# Patient Record
Sex: Male | Born: 1945
Health system: Southern US, Community
[De-identification: ages and names within clinical notes are randomized; demographics above are authoritative.]

## PROBLEM LIST (undated history)

## (undated) DIAGNOSIS — I10 Essential (primary) hypertension: Secondary | ICD-10-CM

## (undated) DIAGNOSIS — R7303 Prediabetes: Secondary | ICD-10-CM

## (undated) DIAGNOSIS — E785 Hyperlipidemia, unspecified: Secondary | ICD-10-CM

## (undated) DIAGNOSIS — K5900 Constipation, unspecified: Secondary | ICD-10-CM

## (undated) DIAGNOSIS — E559 Vitamin D deficiency, unspecified: Secondary | ICD-10-CM

## (undated) HISTORY — DX: Constipation, unspecified: K59.00

## (undated) HISTORY — DX: Vitamin D deficiency, unspecified: E55.9

## (undated) HISTORY — DX: Prediabetes: R73.03

## (undated) HISTORY — PX: COLONOSCOPY: SHX174

## (undated) HISTORY — DX: Essential (primary) hypertension: I10

## (undated) HISTORY — DX: Hyperlipidemia, unspecified: E78.5

## (undated) HISTORY — PX: NO PAST SURGERIES: SHX2092

---

## 2012-01-04 ENCOUNTER — Encounter: Payer: Self-pay | Admitting: Gastroenterology

## 2013-01-22 ENCOUNTER — Ambulatory Visit (HOSPITAL_COMMUNITY)
Admission: RE | Admit: 2013-01-22 | Discharge: 2013-01-22 | Disposition: A | Discharge status: Home / Self Care | Payer: Medicare Other | Source: Ambulatory Visit | Attending: Internal Medicine | Admitting: Internal Medicine

## 2013-01-22 ENCOUNTER — Other Ambulatory Visit (HOSPITAL_COMMUNITY): Payer: Self-pay | Admitting: Internal Medicine

## 2013-01-22 DIAGNOSIS — M549 Dorsalgia, unspecified: Secondary | ICD-10-CM

## 2013-01-22 DIAGNOSIS — N200 Calculus of kidney: Secondary | ICD-10-CM | POA: Insufficient documentation

## 2013-01-22 DIAGNOSIS — R109 Unspecified abdominal pain: Secondary | ICD-10-CM | POA: Insufficient documentation

## 2013-01-23 ENCOUNTER — Encounter: Payer: Self-pay | Admitting: Gastroenterology

## 2013-08-31 ENCOUNTER — Other Ambulatory Visit: Payer: Self-pay | Admitting: Internal Medicine

## 2013-08-31 MED ORDER — PRAVASTATIN SODIUM 40 MG PO TABS: 40.0000 mg | ORAL_TABLET | Freq: Every evening | ORAL | Status: DC

## 2013-09-17 ENCOUNTER — Other Ambulatory Visit: Payer: Self-pay | Admitting: Internal Medicine

## 2013-09-17 MED ORDER — OMEPRAZOLE 20 MG PO CPDR: 20.0000 mg | DELAYED_RELEASE_CAPSULE | Freq: Two times a day (BID) | ORAL | Status: DC

## 2013-09-18 ENCOUNTER — Ambulatory Visit: Payer: Self-pay | Admitting: Internal Medicine

## 2013-10-08 ENCOUNTER — Encounter: Payer: Self-pay | Admitting: Emergency Medicine

## 2013-10-08 ENCOUNTER — Ambulatory Visit (INDEPENDENT_AMBULATORY_CARE_PROVIDER_SITE_OTHER): Payer: Medicare Other | Admitting: Emergency Medicine

## 2013-10-08 VITALS — BP 100/62 | HR 62 | Temp 98.4°F | Resp 16 | Ht 74.25 in | Wt 198.0 lb

## 2013-10-08 DIAGNOSIS — J309 Allergic rhinitis, unspecified: Secondary | ICD-10-CM

## 2013-10-08 DIAGNOSIS — I10 Essential (primary) hypertension: Secondary | ICD-10-CM

## 2013-10-08 DIAGNOSIS — J329 Chronic sinusitis, unspecified: Secondary | ICD-10-CM

## 2013-10-08 DIAGNOSIS — J209 Acute bronchitis, unspecified: Secondary | ICD-10-CM

## 2013-10-08 MED ORDER — PREDNISONE 10 MG PO TABS: ORAL_TABLET | ORAL | Status: DC

## 2013-10-08 MED ORDER — BENZONATATE 100 MG PO CAPS: 100.0000 mg | ORAL_CAPSULE | Freq: Three times a day (TID) | ORAL | Status: DC | PRN

## 2013-10-08 MED ORDER — AZITHROMYCIN 250 MG PO TABS: ORAL_TABLET | ORAL | Status: AC

## 2013-10-08 NOTE — Progress Notes (Signed)
   Subjective:    Patient ID: Mathew Miller, male    DOB: 27-Apr-1946, 67 y.o.   MRN: [id number]  HPI Comments: 67 yo male with allergy drainage over last week or so increased with cleaning dusty office. Saturday noticed increased sinus pressure. He notes green production with cough. Tessalon perles have helped thin things a little.  He has retired and has noticed decreased stress level and is feeling well overall.  Cough Associated symptoms include postnasal drip.   Current Outpatient Prescriptions on File Prior to Visit  Medication Sig Dispense Refill  . omeprazole (PRILOSEC) 20 MG capsule Take 1 capsule (20 mg total) by mouth 2 (two) times daily.  180 capsule  1  . pravastatin (PRAVACHOL) 40 MG tablet Take 1 tablet (40 mg total) by mouth every evening.  90 tablet  1   No current facility-administered medications on file prior to visit.   ALLERGIES Sudafed and Tetanus toxoids  No past medical history on file.    Review of Systems  HENT: Positive for congestion, postnasal drip and sinus pressure.   Respiratory: Positive for cough.    BP 100/62  Pulse 62  Temp(Src) 98.4 F (36.9 C) (Temporal)  Resp 16  Ht 6' 2.25" (1.886 m)  Wt 198 lb (89.812 kg)  BMI 25.25 kg/m2     Objective:   Physical Exam  Nursing note and vitals reviewed. Constitutional: He is oriented to person, place, and time. He appears well-developed and well-nourished.  HENT:  Head: Normocephalic and atraumatic.  Right Ear: External ear normal.  Left Ear: External ear normal.  Nose: Nose normal.  Mouth/Throat: Oropharynx is clear and moist. No oropharyngeal exudate.  Frontal tenderness, TMs cloudy  Eyes: Conjunctivae are normal.  Neck: Normal range of motion.  Cardiovascular: Normal rate, regular rhythm, normal heart sounds and intact distal pulses.   Pulmonary/Chest: Effort normal and breath sounds normal.  Abdominal: Soft.  Musculoskeletal: Normal range of motion.  Lymphadenopathy:    He has no  cervical adenopathy.  Neurological: He is alert and oriented to person, place, and time.  Skin: Skin is warm and dry.  Psychiatric: He has a normal mood and affect. Judgment normal.          Assessment & Plan:  1. HTN HX- With decreased stress check BP if stays low decrease Enalapril by 1/2 =10mg  2. Bronchitis/ Sinus Infection, Allergic rhinitis-Zpak, Tessalon Perles, Pred 10 mg DP all AD. Allegra OTC, increase H2o, allergy hygiene explained.

## 2013-10-08 NOTE — Patient Instructions (Signed)
Sinusitis Sinusitis is redness, soreness, and puffiness (inflammation) of the air pockets in the bones of your face (sinuses). The redness, soreness, and puffiness can cause air and mucus to get trapped in your sinuses. This can allow germs to grow and cause an infection.  HOME CARE   Drink enough fluids to keep your pee (urine) clear or pale yellow.  Use a humidifier in your home.  Run a hot shower to create steam in the bathroom. Sit in the bathroom with the door closed. Breathe in the steam 3 4 times a day.  Put a warm, moist washcloth on your face 3 4 times a day, or as told by your doctor.  Use salt water sprays (saline sprays) to wet the thick fluid in your nose. This can help the sinuses drain.  Only take medicine as told by your doctor. GET HELP RIGHT AWAY IF:   Your pain gets worse.  You have very bad headaches.  You are sick to your stomach (nauseous).  You throw up (vomit).  You are very sleepy (drowsy) all the time.  Your face is puffy (swollen).  Your vision changes.  You have a stiff neck.  You have trouble breathing. MAKE SURE YOU:   Understand these instructions.  Will watch your condition.  Will get help right away if you are not doing well or get worse. Document Released: 03/22/2008 Document Revised: 06/28/2012 Document Reviewed: 05/09/2012 Exit[employer] Patient Information 2014 [employer]. Bronchitis Bronchitis is a problem of the air tubes leading to your lungs. This problem makes it hard for air to get in and out of the lungs. You may cough a lot because your air tubes are narrow. Going without care can cause lasting (chronic) bronchitis. HOME CARE   Drink enough fluids to keep your pee (urine) clear or pale yellow.  Use a cool mist humidifier.  Quit smoking if you smoke. If you keep smoking, the bronchitis might not get better.  Only take medicine as told by your doctor. GET HELP RIGHT AWAY IF:   Coughing keeps you awake.  You start to  wheeze.  You become more sick or weak.  You have a hard time breathing or get short of breath.  You cough up blood.  Coughing lasts more than 2 weeks.  You have a fever.  Your baby is older than 3 months with a rectal temperature of 102 F (38.9 C) or higher.  Your baby is 3 months old or younger with a rectal temperature of 100.4 F (38 C) or higher. MAKE SURE YOU:  Understand these instructions.  Will watch your condition.  Will get help right away if you are not doing well or get worse. Document Released: 03/22/2008 Document Revised: 12/27/2011 Document Reviewed: 05/29/2013 Exit[employer] Patient Information 2014 [employer].  

## 2013-10-09 DIAGNOSIS — I10 Essential (primary) hypertension: Secondary | ICD-10-CM | POA: Insufficient documentation

## 2013-10-15 ENCOUNTER — Other Ambulatory Visit: Payer: Self-pay | Admitting: Emergency Medicine

## 2013-10-15 MED ORDER — AZITHROMYCIN 250 MG PO TABS: ORAL_TABLET | ORAL | Status: AC

## 2013-10-30 ENCOUNTER — Telehealth: Payer: Self-pay

## 2013-10-30 NOTE — Telephone Encounter (Signed)
Referral to see Dr.Tannenbaum has been approved. Pt aware.

## 2013-11-24 DIAGNOSIS — E785 Hyperlipidemia, unspecified: Secondary | ICD-10-CM | POA: Insufficient documentation

## 2013-11-24 DIAGNOSIS — Z87898 Personal history of other specified conditions: Secondary | ICD-10-CM | POA: Insufficient documentation

## 2013-11-27 ENCOUNTER — Encounter: Payer: Self-pay | Admitting: Internal Medicine

## 2013-12-25 ENCOUNTER — Encounter: Payer: Self-pay | Admitting: Internal Medicine

## 2014-02-04 ENCOUNTER — Other Ambulatory Visit: Payer: Self-pay | Admitting: Internal Medicine

## 2014-02-05 ENCOUNTER — Telehealth: Payer: Self-pay | Admitting: *Deleted

## 2014-02-05 MED ORDER — ENALAPRIL MALEATE 20 MG PO TABS: ORAL_TABLET | ORAL | Status: DC

## 2014-02-05 NOTE — Telephone Encounter (Signed)
REFILL= ENALAPRIL 20MG  #90

## 2014-03-01 ENCOUNTER — Encounter: Payer: Self-pay | Admitting: Internal Medicine

## 2014-03-01 ENCOUNTER — Ambulatory Visit (INDEPENDENT_AMBULATORY_CARE_PROVIDER_SITE_OTHER): Payer: Commercial Managed Care - HMO | Admitting: Internal Medicine

## 2014-03-01 VITALS — BP 138/90 | HR 52 | Temp 98.2°F | Resp 16 | Ht 74.0 in | Wt 200.0 lb

## 2014-03-01 DIAGNOSIS — R7303 Prediabetes: Secondary | ICD-10-CM

## 2014-03-01 DIAGNOSIS — Z125 Encounter for screening for malignant neoplasm of prostate: Secondary | ICD-10-CM

## 2014-03-01 DIAGNOSIS — I10 Essential (primary) hypertension: Secondary | ICD-10-CM

## 2014-03-01 DIAGNOSIS — Z1212 Encounter for screening for malignant neoplasm of rectum: Secondary | ICD-10-CM

## 2014-03-01 DIAGNOSIS — Z79899 Other long term (current) drug therapy: Secondary | ICD-10-CM | POA: Insufficient documentation

## 2014-03-01 DIAGNOSIS — Z1331 Encounter for screening for depression: Secondary | ICD-10-CM

## 2014-03-01 DIAGNOSIS — Z789 Other specified health status: Secondary | ICD-10-CM

## 2014-03-01 DIAGNOSIS — E785 Hyperlipidemia, unspecified: Secondary | ICD-10-CM

## 2014-03-01 DIAGNOSIS — Z7189 Other specified counseling: Secondary | ICD-10-CM | POA: Insufficient documentation

## 2014-03-01 DIAGNOSIS — Z0000 Encounter for general adult medical examination without abnormal findings: Secondary | ICD-10-CM

## 2014-03-01 DIAGNOSIS — E559 Vitamin D deficiency, unspecified: Secondary | ICD-10-CM | POA: Insufficient documentation

## 2014-03-01 LAB — CBC WITH DIFFERENTIAL/PLATELET
Basophils Absolute: 0 10*3/uL (ref 0.0–0.1)
Basophils Relative: 0 % (ref 0–1)
Eosinophils Absolute: 0.2 10*3/uL (ref 0.0–0.7)
Eosinophils Relative: 2 % (ref 0–5)
HCT: 46.1 % (ref 39.0–52.0)
Hemoglobin: 16.1 g/dL (ref 13.0–17.0)
Lymphocytes Relative: 19 % (ref 12–46)
Lymphs Abs: 1.5 10*3/uL (ref 0.7–4.0)
MCH: 30.1 pg (ref 26.0–34.0)
MCHC: 34.9 g/dL (ref 30.0–36.0)
MCV: 86.3 fL (ref 78.0–100.0)
Monocytes Absolute: 0.6 10*3/uL (ref 0.1–1.0)
Monocytes Relative: 8 % (ref 3–12)
Neutro Abs: 5.8 10*3/uL (ref 1.7–7.7)
Neutrophils Relative %: 71 % (ref 43–77)
Platelets: 167 10*3/uL (ref 150–400)
RBC: 5.34 MIL/uL (ref 4.22–5.81)
RDW: 14.5 % (ref 11.5–15.5)
WBC: 8.1 10*3/uL (ref 4.0–10.5)

## 2014-03-01 LAB — HEMOGLOBIN A1C
Hgb A1c MFr Bld: 5.6 % (ref <5.7)
Mean Plasma Glucose: 114 mg/dL (ref <117)

## 2014-03-01 NOTE — Progress Notes (Signed)
Patient ID: Mathew Miller, male   DOB: Jan 06, 1946, 68 y.o.   MRN: [id number]   Annual Screening Comprehensive Examination  This very nice 68 y.o. [name] presents for complete physical.  Patient has been followed for HTN,  Prediabetes, Hyperlipidemia, and Vitamin D Deficiency.   HTN predates since 2000. Patient's BP has been controlled at home.Today's BP: 138/90 mmHg. Patient denies any cardiac symptoms as chest pain, palpitations, shortness of breath, dizziness or ankle swelling.   Patient's hyperlipidemia is controlled with diet and medications. Patient denies myalgias or other medication SE's. Last cholesterol last visit was 138, triglycerides 52, HDL 59 and LDL 69 in Aug 2014 - at goal and patient was recommended 3 month f/u  Which he never follows thru on as recommended.     Patient has prediabetes since   Aug 2010 with A1c 6.1% and last A1c was  5.3% with Insulin 11- normal in Aug 2014. Patient denies reactive hypoglycemic symptoms, visual blurring, diabetic polys or paresthesias.    Finally, patient has history of Vitamin D Deficiency of 33 in 2008 and last vitamin D 86 in Aug 2014.  Medication Sig  . atenolol (TENORMIN) 25 MG tablet Take 25 mg by mouth daily.  . Cholecalciferol (VITAMIN D-3) 5000 UNITS  Take 5,000 Units by mouth daily.  . enalapril (VASOTEC) 20 MG tablet TAKE ONE TABLET BY MOUTH ONCE DAILY  . Magnesium 250 MG TABS Take 250 mg by mouth daily.  Marland Kitchen omeprazole (PRILOSEC) 20 MG capsule Take 1 capsule (20 mg total) by mouth 2 (two) times daily.  . pravastatin (PRAVACHOL) 40 MG tablet Take 1 tablet (40 mg total) by mouth every evening.  . tadalafil (CIALIS) 20 MG tablet Take PRN   Allergies  Allergen Reactions  . Sudafed [Pseudoephedrine Hcl]     Urinary retention  . Tetanus Toxoids     Past Medical History  Diagnosis Date  . Hypertension   . Hyperlipidemia   . Vitamin D deficiency   . Prediabetes    Past Surgical History  Procedure Laterality Date  . No past  surgeries     Family History  Problem Relation Age of Onset  . Hyperlipidemia Mother   . Glaucoma Mother   . Heart disease Father   . Hypertension Father    History   Social History  . Marital Status: Unknown    Spouse Name: N/A    Number of Children: 2 daughters & 2 GC in good health  . Years of Education: N/A   Occupational History  . 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 Smoking status: Never Smoker   . Smokeless tobacco: Not on file  . Alcohol Use: Yes     Comment: Rare /Beer  . Drug Use: No  . Sexual Activity: Not on file    ROS Constitutional: Denies fever, chills, weight loss/gain, headaches, insomnia, fatigue, night sweats or change in appetite. Eyes: Denies redness, blurred vision, diplopia, discharge, itchy or watery eyes.  ENT: Denies discharge, congestion, post nasal drip, epistaxis, sore throat, earache, hearing loss, dental pain, Tinnitus, Vertigo, Sinus pain or snoring.  Cardio: Denies chest pain, palpitations, irregular heartbeat, syncope, dyspnea, diaphoresis, orthopnea, PND, claudication or edema Respiratory: denies cough, dyspnea, DOE, pleurisy, hoarseness, laryngitis or wheezing.  Gastrointestinal: Denies dysphagia, heartburn, reflux, water brash, pain, cramps, nausea, vomiting, bloating, diarrhea, constipation, hematemesis, melena, hematochezia, jaundice or hemorrhoids Genitourinary: Denies dysuria, frequency, urgency, nocturia, hesitancy, discharge, hematuria or flank pain Musculoskeletal: Denies  arthralgia, myalgia, stiffness, Jt. Swelling, pain, limp or strain/sprain. Denies falls. Skin: Denies puritis, rash, hives, warts, acne, eczema or change in skin lesion Neuro: No weakness, tremor, incoordination, spasms, paresthesia or pain Psychiatric: Denies confusion, memory loss or sensory loss . Denies Depression. Endocrine: Denies change in weight, skin, hair change, nocturia, and paresthesia,  diabetic polys, visual blurring or hyper / hypo glycemic episodes.  Heme/Lymph: No excessive bleeding, bruising or enlarged lymph nodes.  Physical Exam  BP 138/90  Pulse 52  Temp 98.2 F   Resp 16  Ht 6\' 2"    Wt 200 lb   BMI 25.67 kg/m2  General Appearance: Well nourished, in no apparent distress. Eyes: PERRLA, EOMs, conjunctiva no swelling or erythema, normal fundi and vessels. Sinuses: No frontal/maxillary tenderness ENT/Mouth: EACs patent / TMs  nl. Nares clear without erythema, swelling, mucoid exudates. Oral hygiene is good. No erythema, swelling, or exudate. Tongue normal, non-obstructing. Tonsils not swollen or erythematous. Hearing normal.  Neck: Supple, thyroid normal. No bruits, nodes or JVD. Respiratory: Respiratory effort normal.  BS equal and clear bilateral without rales, rhonci, wheezing or stridor. Cardio: Heart sounds are normal with regular rate and rhythm and no murmurs, rubs or gallops. Peripheral pulses are normal and equal bilaterally without edema. No aortic or femoral bruits. Chest: symmetric with normal excursions and percussion.  Abdomen: Flat, soft, with bowl sounds. Nontender, no guarding, rebound, hernias, masses, or organomegaly.  Lymphatics: Non tender without lymphadenopathy.  Genitourinary: No hernias.Testes nl. DRE - prostate nl for age - smooth & firm w/o nodules. Musculoskeletal: Full ROM all peripheral extremities, joint stability, 5/5 strength, and normal gait. Skin: Warm and dry without rashes, lesions, cyanosis, clubbing or  ecchymosis.  Neuro: Cranial nerves intact, reflexes equal bilaterally. Normal muscle tone, no cerebellar symptoms. Sensation intact.  Pysch: Awake and oriented X 3, normal affect, insight and judgment appropriate.   Assessment and Plan  1. Annual Screening Examination 2. Hypertension  3. Hyperlipidemia 4. Pre Diabetes 5. Vitamin D Deficiency  Continue prudent diet as discussed, weight control, BP monitoring, regular  exercise, and medications as discussed.  Discussed med effects and SE's. Routine screening labs and tests as requested with regular follow-up as recommended.

## 2014-03-01 NOTE — Patient Instructions (Signed)

## 2014-03-02 LAB — BASIC METABOLIC PANEL WITH GFR
BUN: 18 mg/dL (ref 6–23)
CO2: 27 mEq/L (ref 19–32)
Calcium: 9.3 mg/dL (ref 8.4–10.5)
Chloride: 104 mEq/L (ref 96–112)
Creat: 1.15 mg/dL (ref 0.50–1.35)
GFR, Est African American: 74 mL/min
GFR, Est Non African American: 64 mL/min
Glucose, Bld: 91 mg/dL (ref 70–99)
Potassium: 4.4 mEq/L (ref 3.5–5.3)
Sodium: 142 mEq/L (ref 135–145)

## 2014-03-02 LAB — LIPID PANEL
Cholesterol: 145 mg/dL (ref 0–200)
HDL: 59 mg/dL (ref >39)
LDL Cholesterol: 67 mg/dL (ref 0–99)
Total CHOL/HDL Ratio: 2.5 Ratio
Triglycerides: 93 mg/dL (ref <150)
VLDL: 19 mg/dL (ref 0–40)

## 2014-03-02 LAB — URINALYSIS, MICROSCOPIC ONLY
Bacteria, UA: NONE SEEN
Casts: NONE SEEN
Crystals: NONE SEEN
Squamous Epithelial / LPF: NONE SEEN

## 2014-03-02 LAB — MICROALBUMIN / CREATININE URINE RATIO
Creatinine, Urine: 40.8 mg/dL
Microalb Creat Ratio: 12.3 mg/g (ref 0.0–30.0)
Microalb, Ur: 0.5 mg/dL (ref 0.00–1.89)

## 2014-03-02 LAB — VITAMIN D 25 HYDROXY (VIT D DEFICIENCY, FRACTURES): Vit D, 25-Hydroxy: 94 ng/mL — ABNORMAL HIGH (ref 30–89)

## 2014-03-02 LAB — HEPATIC FUNCTION PANEL
ALT: 15 U/L (ref 0–53)
AST: 17 U/L (ref 0–37)
Albumin: 4.1 g/dL (ref 3.5–5.2)
Alkaline Phosphatase: 62 U/L (ref 39–117)
Bilirubin, Direct: 0.2 mg/dL (ref 0.0–0.3)
Indirect Bilirubin: 0.7 mg/dL (ref 0.2–1.2)
Total Bilirubin: 0.9 mg/dL (ref 0.2–1.2)
Total Protein: 6.4 g/dL (ref 6.0–8.3)

## 2014-03-02 LAB — PSA: PSA: 4.7 ng/mL — ABNORMAL HIGH (ref <=4.00)

## 2014-03-02 LAB — MAGNESIUM: Magnesium: 2 mg/dL (ref 1.5–2.5)

## 2014-03-02 LAB — TSH: TSH: 1.871 u[IU]/mL (ref 0.350–4.500)

## 2014-03-02 LAB — INSULIN, FASTING: Insulin fasting, serum: 68 u[IU]/mL — ABNORMAL HIGH (ref 3–28)

## 2014-03-04 ENCOUNTER — Telehealth: Payer: Self-pay

## 2014-03-04 NOTE — Telephone Encounter (Signed)
lmom to pt to return my call for lab results. 

## 2014-03-04 NOTE — Telephone Encounter (Signed)
Message copied by Nadyne Coombes on Mon Mar 04, 2014  1:42 PM ------      Message from: Unk Pinto      Created: Sun Mar 03, 2014  6:30 PM       PSA   Elevated - will repeatat at 3 month chol / liver check - needs appt with Estill Bamberg in 3 mo and 6 mo with [facility].      CBC kidneys liver Mag Thyroid - all nl/ok      Chol 145/LDL 67 - wonderful      A1c 5.6% nl, but Insulin 68 high (Nl<28) shows insulin resistance - and       An early phase of Diabetes - recc no sweet/candy or white stuff and lose weight      Vit D 94 great (goal is betw 70-100) ------

## 2014-04-16 ENCOUNTER — Other Ambulatory Visit: Payer: Self-pay | Admitting: Internal Medicine

## 2014-05-10 ENCOUNTER — Other Ambulatory Visit: Payer: Self-pay | Admitting: Physician Assistant

## 2014-05-30 ENCOUNTER — Ambulatory Visit: Payer: Self-pay | Admitting: Physician Assistant

## 2014-06-12 ENCOUNTER — Ambulatory Visit (INDEPENDENT_AMBULATORY_CARE_PROVIDER_SITE_OTHER): Payer: Commercial Managed Care - HMO | Admitting: Physician Assistant

## 2014-06-12 VITALS — BP 138/70 | HR 64 | Temp 98.1°F | Resp 16 | Ht 74.25 in | Wt 204.0 lb

## 2014-06-12 DIAGNOSIS — Z79899 Other long term (current) drug therapy: Secondary | ICD-10-CM

## 2014-06-12 DIAGNOSIS — R7303 Prediabetes: Secondary | ICD-10-CM

## 2014-06-12 DIAGNOSIS — Z0000 Encounter for general adult medical examination without abnormal findings: Secondary | ICD-10-CM

## 2014-06-12 DIAGNOSIS — Z1331 Encounter for screening for depression: Secondary | ICD-10-CM

## 2014-06-12 DIAGNOSIS — I10 Essential (primary) hypertension: Secondary | ICD-10-CM

## 2014-06-12 DIAGNOSIS — Z789 Other specified health status: Secondary | ICD-10-CM

## 2014-06-12 DIAGNOSIS — R7309 Other abnormal glucose: Secondary | ICD-10-CM

## 2014-06-12 DIAGNOSIS — E785 Hyperlipidemia, unspecified: Secondary | ICD-10-CM

## 2014-06-12 DIAGNOSIS — E559 Vitamin D deficiency, unspecified: Secondary | ICD-10-CM

## 2014-06-12 DIAGNOSIS — N390 Urinary tract infection, site not specified: Secondary | ICD-10-CM

## 2014-06-12 LAB — CBC WITH DIFFERENTIAL/PLATELET
Basophils Absolute: 0 10*3/uL (ref 0.0–0.1)
Basophils Relative: 0 % (ref 0–1)
Eosinophils Absolute: 0.1 10*3/uL (ref 0.0–0.7)
Eosinophils Relative: 1 % (ref 0–5)
HCT: 46.1 % (ref 39.0–52.0)
Hemoglobin: 16 g/dL (ref 13.0–17.0)
Lymphocytes Relative: 17 % (ref 12–46)
Lymphs Abs: 1.6 10*3/uL (ref 0.7–4.0)
MCH: 30 pg (ref 26.0–34.0)
MCHC: 34.7 g/dL (ref 30.0–36.0)
MCV: 86.3 fL (ref 78.0–100.0)
Monocytes Absolute: 0.6 10*3/uL (ref 0.1–1.0)
Monocytes Relative: 6 % (ref 3–12)
Neutro Abs: 7 10*3/uL (ref 1.7–7.7)
Neutrophils Relative %: 76 % (ref 43–77)
Platelets: 195 10*3/uL (ref 150–400)
RBC: 5.34 MIL/uL (ref 4.22–5.81)
RDW: 14.4 % (ref 11.5–15.5)
WBC: 9.2 10*3/uL (ref 4.0–10.5)

## 2014-06-12 LAB — HEMOGLOBIN A1C
Hgb A1c MFr Bld: 5.5 % (ref <5.7)
Mean Plasma Glucose: 111 mg/dL (ref <117)

## 2014-06-12 MED ORDER — CIPROFLOXACIN HCL 500 MG PO TABS: 500.0000 mg | ORAL_TABLET | Freq: Two times a day (BID) | ORAL | Status: AC

## 2014-06-12 NOTE — Progress Notes (Signed)
MEDICARE ANNUAL WELLNESS VISIT AND FOLLOW UP Assessment:   1. Essential hypertension - CBC with Differential - BASIC METABOLIC PANEL WITH GFR - Hepatic function panel - TSH  2. Prediabetes Discussed general issues about diabetes pathophysiology and management., Educational material distributed., Suggested low cholesterol diet., Encouraged aerobic exercise., Discussed foot care., Reminded to get yearly retinal exam. - Hemoglobin A1c - Insulin, fasting - HM DIABETES FOOT EXAM  3. Encounter for long-term (current) use of other medications - Magnesium  4. Hyperlipidemia - Lipid panel  5. Vitamin D Deficiency Continue supplement  6. Urinary tract infection, site not specified cipro sent in for symptoms - Urinalysis, Routine w reflex microscopic - Urine culture   Plan:   During the course of the visit the patient was educated and counseled about appropriate screening and preventive services including:    Pneumococcal vaccine   Influenza vaccine  Td vaccine  Screening electrocardiogram  Colorectal cancer screening  Diabetes screening  Glaucoma screening  Nutrition counseling   Screening recommendations, referrals: Vaccinations: Tdap vaccine not indicated Influenza vaccine requested Pneumococcal vaccine requested Shingles vaccine declined Hep B vaccine not indicated  Nutrition assessed and recommended  Colonoscopy requested- given Dr. Kelby Fam number since he is over due Recommended yearly ophthalmology/optometry visit for glaucoma screening and checkup Recommended yearly dental visit for hygiene and checkup Advanced directives - requested  Conditions/risks identified: BMI: Discussed weight loss, diet, and increase physical activity.  Increase physical activity: AHA recommends 150 minutes of physical activity a week.  Medications reviewed Diabetes is at goal, ACE/ARB therapy: Yes. Urinary Incontinence is not an issue: discussed non pharmacology and  pharmacology options.  Fall risk: low- discussed PT, home fall assessment, medications.    Subjective:  Mathew Miller is a 68 y.o. male who presents for Medicare Annual Wellness Visit and 3 month follow up for HTN, hyperlipidemia, prediabetes, and vitamin D Def.  Date of last medicare wellness visit was is unknown.  His blood pressure has been controlled at home, today their BP is BP: 138/70 mmHg He does workout. He denies chest pain, shortness of breath, dizziness.  He is on cholesterol medication and denies myalgias. His cholesterol is at goal. The cholesterol last visit was:   Lab Results  Component Value Date   CHOL 145 03/01/2014   HDL 59 03/01/2014   LDLCALC 67 03/01/2014   TRIG 93 03/01/2014   CHOLHDL 2.5 03/01/2014   He has been working on diet and exercise for prediabetes, and denies paresthesia of the feet, polydipsia, polyuria and visual disturbances. Last A1C in the office was:  Lab Results  Component Value Date   HGBA1C 5.6 03/01/2014   Patient is on Vitamin D supplement.   Lab Results  Component Value Date   VD25OH 94* 03/01/2014     For the last 2-3 days he has been having frequency with dysuria, denies hesitancy/dribbling, lower back pain, fever chills.   Names of Other Physician/Practitioners you currently use: 1. [facility] here for primary care 2. [place], eye doctor, last visit 2 years 3. Dr. Deatra Ina, GI Patient Care Team: Unk Pinto, MD as PCP - General (Internal Medicine)  Medication Review: Current Outpatient Prescriptions on File Prior to Visit  Medication Sig Dispense Refill  . atenolol (TENORMIN) 25 MG tablet Take 25 mg by mouth daily.      Marland Kitchen atenolol (TENORMIN) 50 MG tablet TAKE ONE TABLET BY MOUTH ONCE DAILY  90 tablet  0  . Cholecalciferol (VITAMIN D-3) 5000 UNITS TABS  Take 5,000 Units by mouth daily.      . enalapril (VASOTEC) 20 MG tablet TAKE ONE TABLET BY MOUTH ONCE DAILY  45 tablet  0  . Magnesium  250 MG TABS Take 250 mg by mouth daily.      Marland Kitchen omeprazole (PRILOSEC) 20 MG capsule Take 1 capsule (20 mg total) by mouth 2 (two) times daily.  180 capsule  1  . pravastatin (PRAVACHOL) 40 MG tablet Take 1 tablet (40 mg total) by mouth every evening.  90 tablet  1  . tadalafil (CIALIS) 20 MG tablet Take 20 mg by mouth daily as needed for erectile dysfunction. Takes 1/2 daily       No current facility-administered medications on file prior to visit.    Current Problems (verified) Patient Active Problem List   Diagnosis Date Noted  . Vitamin D Deficiency 03/01/2014  . Encounter for long-term (current) use of other medications 03/01/2014  . Hyperlipidemia   . Prediabetes   . HTN (hypertension) 10/09/2013    Screening Tests Health Maintenance  Topic Date Due  . Tetanus/tdap  10/19/1975  . Colonoscopy  12/07/1993  . Zostavax  12/08/2003  . Influenza Vaccine  05/18/2014  . Pneumococcal Polysaccharide Vaccine Age 43 And Over  Completed    Immunization History  Administered Date(s) Administered  . Influenza Split 08/06/2013  . Pneumococcal Polysaccharide-23 10/21/2009  . Td 10/18/1965    Preventative care: Last colonoscopy: 2003 due 2013 Dr. Deatra Ina  Prior vaccinations: TD or Tdap: allergy  Influenza: 2013 Pneumococcal: 2011 Shingles/Zostavax: 2008 at health department  History reviewed: allergies, current medications, past family history, past medical history, past social history, past surgical history and problem list   Risk Factors: Tobacco History  Substance Use Topics  . Smoking status: Never Smoker   . Smokeless tobacco: Not on file  . Alcohol Use: Yes     Comment: Rare /Beer   He does not smoke.  Patient is not a former smoker. Are there smokers in your home (other than you)?  No  Alcohol Current alcohol use: social drinker  Caffeine Current caffeine use: coffee 2 /day  Exercise Current exercise: walking  Nutrition/Diet Current diet: in general, a  "healthy" diet    Cardiac risk factors: advanced age (older than 50 for men, 90 for women), dyslipidemia, hypertension and male gender.  Depression Screen (Note: if answer to either of the following is "Yes", a more complete depression screening is indicated)   Q1: Over the past two weeks, have you felt down, depressed or hopeless? No  Q2: Over the past two weeks, have you felt little interest or pleasure in doing things? No  Have you lost interest or pleasure in daily life? No  Do you often feel hopeless? No  Do you cry easily over simple problems? No  Activities of Daily Living In your present state of health, do you have any difficulty performing the following activities?:  Driving? No Managing money?  No Feeding yourself? No Getting from bed to chair? No Climbing a flight of stairs? No Preparing food and eating?: No Bathing or showering? No Getting dressed: No Getting to the toilet? No Using the toilet:No Moving around from place to place: No In the past year have you fallen or had a near fall?:No   Are you sexually active?  Yes  Do you have more than one partner?  No  Vision Difficulties: No  Hearing Difficulties: Yes Do you often ask people to speak up or repeat themselves? Yes  Do you experience ringing or noises in your ears? No Do you have difficulty understanding soft or whispered voices? Yes  Cognition  Do you feel that you have a problem with memory?No  Do you often misplace items? No  Do you feel safe at home?  Yes  Advanced directives Does patient have a [place]? Yes Does patient have a Living Will? Yes   Objective:   Blood pressure 138/70, pulse 64, temperature 98.1 F (36.7 C), resp. rate 16, height 6' 2.25" (1.886 m), weight 204 lb (92.534 kg). Body mass index is 26.01 kg/(m^2).  General appearance: alert, no distress, WD/WN, male Cognitive Testing  Alert? Yes  Normal Appearance?Yes  Oriented to person? Yes  Place? Yes    Time? Yes  Recall of three objects?  Yes  Can perform simple calculations? Yes  Displays appropriate judgment?Yes  Can read the correct time from a watch face?Yes  HEENT: normocephalic, sclerae anicteric, TMs pearly, nares patent, no discharge or erythema, pharynx normal Oral cavity: MMM, no lesions Neck: supple, no lymphadenopathy, no thyromegaly, no masses Heart: RRR, normal S1, S2, no murmurs Lungs: CTA bilaterally, no wheezes, rhonchi, or rales Abdomen: +bs, soft, non tender, non distended, no masses, no hepatomegaly, no splenomegaly Musculoskeletal: nontender, no swelling, no obvious deformity Extremities: no edema, no cyanosis, no clubbing Pulses: 2+ symmetric, upper and lower extremities, normal cap refill Neurological: alert, oriented x 3, CN2-12 intact, strength normal upper extremities and lower extremities, sensation normal throughout, DTRs 2+ throughout, no cerebellar signs, gait normal Psychiatric: normal affect, behavior normal, pleasant   Medicare Attestation I have personally reviewed: The patient's medical and social history Their use of alcohol, tobacco or illicit drugs Their current medications and supplements The patient's functional ability including ADLs,fall risks, home safety risks, cognitive, and hearing and visual impairment Diet and physical activities Evidence for depression or mood disorders  The patient's weight, height, BMI, and visual acuity have been recorded in the chart.  I have made referrals, counseling, and provided education to the patient based on review of the above and I have provided the patient with a written personalized care plan for preventive services.     Vicie Mutters, PA-C   06/12/2014

## 2014-06-12 NOTE — Patient Instructions (Signed)
Dr. Deatra Ina Phone: [phone];   [employer] Free Hearing Test with no obligation # [phone] Tues-Sat 10-6  Preventative Care for Adults, Male       [place]:  A routine yearly physical is a good way to check in with your primary care provider about your health and preventive screening. It is also an opportunity to share updates about your health and any concerns you have, and receive a thorough all-over exam.   Most health insurance companies pay for at least some preventative services.  Check with your health plan for specific coverages.  WHAT PREVENTATIVE SERVICES DO MEN NEED?  Adult men should have their weight and blood pressure checked regularly.   Men age 63 and older should have their cholesterol levels checked regularly.  Beginning at age 21 and continuing to age 49, men should be screened for colorectal cancer.  Certain people should may need continued testing until age 42.  Other cancer screening may include exams for testicular and prostate cancer.  Updating vaccinations is part of preventative care.  Vaccinations help protect against diseases such as the flu.  Lab tests are generally done as part of preventative care to screen for anemia and blood disorders, to screen for problems with the kidneys and liver, to screen for bladder problems, to check blood sugar, and to check your cholesterol level.  Preventative services generally include counseling about diet, exercise, avoiding tobacco, drugs, excessive alcohol consumption, and sexually transmitted infections.    GENERAL RECOMMENDATIONS FOR GOOD HEALTH:  Healthy diet:  Eat a variety of foods, including fruit, vegetables, animal or vegetable protein, such as meat, fish, chicken, and eggs, or beans, lentils, tofu, and grains, such as rice.  Drink plenty of water daily.  Decrease saturated fat in the diet, avoid lots of red meat, processed foods, sweets, fast foods, and fried  foods.  Exercise:  Aerobic exercise helps maintain good heart health. At least 30-40 minutes of moderate-intensity exercise is recommended. For example, a brisk walk that increases your heart rate and breathing. This should be done on most days of the week.   Find a type of exercise or a variety of exercises that you enjoy so that it becomes a part of your daily life.  Examples are running, walking, swimming, water aerobics, and biking.  For motivation and support, explore group exercise such as aerobic class, spin class, Zumba, Yoga,or  martial arts, etc.    Set exercise goals for yourself, such as a certain weight goal, walk or run in a race such as a 5k walk/run.  Speak to your primary care provider about exercise goals.  Disease prevention:  If you smoke or chew tobacco, find out from your caregiver how to quit. It can literally save your life, no matter how long you have been a tobacco user. If you do not use tobacco, never begin.   Maintain a healthy diet and normal weight. Increased weight leads to problems with blood pressure and diabetes.   The Body Mass Index or BMI is a way of measuring how much of your body is fat. Having a BMI above 27 increases the risk of heart disease, diabetes, hypertension, stroke and other problems related to obesity. Your caregiver can help determine your BMI and based on it develop an exercise and dietary program to help you achieve or maintain this important measurement at a healthful level.  High blood pressure causes heart and blood vessel problems.  Persistent high blood pressure should be treated  with medicine if weight loss and exercise do not work.   Fat and cholesterol leaves deposits in your arteries that can block them. This causes heart disease and vessel disease elsewhere in your body.  If your cholesterol is found to be high, or if you have heart disease or certain other medical conditions, then you may need to have your cholesterol monitored  frequently and be treated with medication.   Ask if you should have a stress test if your history suggests this. A stress test is a test done on a treadmill that looks for heart disease. This test can find disease prior to there being a problem.  Avoid drinking alcohol in excess (more than two drinks per day).  Avoid use of street drugs. Do not share needles with anyone. Ask for professional help if you need assistance or instructions on stopping the use of alcohol, cigarettes, and/or drugs.  Brush your teeth twice a day with fluoride toothpaste, and floss once a day. Good oral hygiene prevents tooth decay and gum disease. The problems can be painful, unattractive, and can cause other health problems. Visit your dentist for a routine oral and dental check up and preventive care every 6-12 months.   Look at your skin regularly.  Use a mirror to look at your back. Notify your caregivers of changes in moles, especially if there are changes in shapes, colors, a size larger than a pencil eraser, an irregular border, or development of new moles.  Safety:  Use seatbelts 100% of the time, whether driving or as a passenger.  Use safety devices such as hearing protection if you work in environments with loud noise or significant background noise.  Use safety glasses when doing any work that could send debris in to the eyes.  Use a helmet if you ride a bike or motorcycle.  Use appropriate safety gear for contact sports.  Talk to your caregiver about gun safety.  Use sunscreen with a SPF (or skin protection factor) of 15 or greater.  Lighter skinned people are at a greater risk of skin cancer. Don't forget to also wear sunglasses in order to protect your eyes from too much damaging sunlight. Damaging sunlight can accelerate cataract formation.   Practice safe sex. Use condoms. Condoms are used for birth control and to help reduce the spread of sexually transmitted infections (or STIs).  Some of the STIs are  gonorrhea (the clap), chlamydia, syphilis, trichomonas, herpes, HPV (human papilloma virus) and HIV (human immunodeficiency virus) which causes AIDS. The herpes, HIV and HPV are viral illnesses that have no cure. These can result in disability, cancer and death.   Keep carbon monoxide and smoke detectors in your home functioning at all times. Change the batteries every 6 months or use a model that plugs into the wall.   Vaccinations:  Stay up to date with your tetanus shots and other required immunizations. You should have a booster for tetanus every 10 years. Be sure to get your flu shot every year, since 5%-20% of the [place] population comes down with the flu. The flu vaccine changes each year, so being vaccinated once is not enough. Get your shot in the fall, before the flu season peaks.   Other vaccines to consider:  Pneumococcal vaccine to protect against certain types of pneumonia.  This is normally recommended for adults age 71 or older.  However, adults younger than 70 years old with certain underlying conditions such as diabetes, heart or lung disease should also  receive the vaccine.  Shingles vaccine to protect against Varicella Zoster if you are older than age 42, or younger than 70 years old with certain underlying illness.  Hepatitis A vaccine to protect against a form of infection of the liver by a virus acquired from food.  Hepatitis B vaccine to protect against a form of infection of the liver by a virus acquired from blood or body fluids, particularly if you work in health care.  If you plan to travel internationally, check with your local health department for specific vaccination recommendations.  Cancer Screening:  Most routine colon cancer screening begins at the age of 80. On a yearly basis, doctors may provide special easy to use take-home tests to check for hidden blood in the stool. Sigmoidoscopy or colonoscopy can detect the earliest forms of colon cancer and is life  saving. These tests use a small camera at the end of a tube to directly examine the colon. Speak to your caregiver about this at age 9, when routine screening begins (and is repeated every 5 years unless early forms of pre-cancerous polyps or small growths are found).   At the age of 64 men usually start screening for prostate cancer every year. Screening may begin at a younger age for those with higher risk. Those at higher risk include African-Americans or having a family history of prostate cancer. There are two types of tests for prostate cancer:   Prostate-specific antigen (PSA) testing. Recent studies raise questions about prostate cancer using PSA and you should discuss this with your caregiver.   Digital rectal exam (in which your doctor's lubricated and gloved finger feels for enlargement of the prostate through the anus).   Screening for testicular cancer.  Do a monthly exam of your testicles. Gently roll each testicle between your thumb and fingers, feeling for any abnormal lumps. The best time to do this is after a hot shower or bath when the tissues are looser. Notify your caregivers of any lumps, tenderness or changes in size or shape immediately.

## 2014-06-13 LAB — MAGNESIUM: Magnesium: 2.1 mg/dL (ref 1.5–2.5)

## 2014-06-13 LAB — BASIC METABOLIC PANEL WITH GFR
BUN: 16 mg/dL (ref 6–23)
CO2: 27 mEq/L (ref 19–32)
Calcium: 9.6 mg/dL (ref 8.4–10.5)
Chloride: 105 mEq/L (ref 96–112)
Creat: 1.12 mg/dL (ref 0.50–1.35)
GFR, Est African American: 77 mL/min
GFR, Est Non African American: 66 mL/min
Glucose, Bld: 97 mg/dL (ref 70–99)
Potassium: 4.1 mEq/L (ref 3.5–5.3)
Sodium: 141 mEq/L (ref 135–145)

## 2014-06-13 LAB — URINALYSIS, ROUTINE W REFLEX MICROSCOPIC
Bilirubin Urine: NEGATIVE
Glucose, UA: NEGATIVE mg/dL
Hgb urine dipstick: NEGATIVE
Ketones, ur: NEGATIVE mg/dL
Leukocytes, UA: NEGATIVE
Nitrite: NEGATIVE
Protein, ur: NEGATIVE mg/dL
Specific Gravity, Urine: 1.018 (ref 1.005–1.030)
Urobilinogen, UA: 1 mg/dL (ref 0.0–1.0)
pH: 6.5 (ref 5.0–8.0)

## 2014-06-13 LAB — LIPID PANEL
Cholesterol: 150 mg/dL (ref 0–200)
HDL: 59 mg/dL (ref >39)
LDL Cholesterol: 75 mg/dL (ref 0–99)
Total CHOL/HDL Ratio: 2.5 Ratio
Triglycerides: 80 mg/dL (ref <150)
VLDL: 16 mg/dL (ref 0–40)

## 2014-06-13 LAB — HEPATIC FUNCTION PANEL
ALT: 18 U/L (ref 0–53)
AST: 20 U/L (ref 0–37)
Albumin: 4.5 g/dL (ref 3.5–5.2)
Alkaline Phosphatase: 77 U/L (ref 39–117)
Bilirubin, Direct: 0.1 mg/dL (ref 0.0–0.3)
Indirect Bilirubin: 0.5 mg/dL (ref 0.2–1.2)
Total Bilirubin: 0.6 mg/dL (ref 0.2–1.2)
Total Protein: 6.8 g/dL (ref 6.0–8.3)

## 2014-06-13 LAB — TSH: TSH: 1.971 u[IU]/mL (ref 0.350–4.500)

## 2014-06-13 LAB — URINE CULTURE: Colony Count: 8000

## 2014-06-13 LAB — INSULIN, FASTING: Insulin fasting, serum: 32.4 u[IU]/mL — ABNORMAL HIGH (ref 2.0–19.6)

## 2014-07-03 ENCOUNTER — Telehealth: Payer: Self-pay | Admitting: Internal Medicine

## 2014-07-03 NOTE — Telephone Encounter (Signed)
returned your call  Thank you, Katrina Judeth [facility] [phone] Fax [phone]

## 2014-07-08 ENCOUNTER — Other Ambulatory Visit: Payer: Self-pay

## 2014-07-08 DIAGNOSIS — H9193 Unspecified hearing loss, bilateral: Secondary | ICD-10-CM

## 2014-07-22 ENCOUNTER — Other Ambulatory Visit: Payer: Self-pay

## 2014-07-22 MED ORDER — ATENOLOL 50 MG PO TABS: 50.0000 mg | ORAL_TABLET | Freq: Every day | ORAL | Status: DC

## 2014-07-30 ENCOUNTER — Ambulatory Visit (INDEPENDENT_AMBULATORY_CARE_PROVIDER_SITE_OTHER): Payer: Commercial Managed Care - HMO | Admitting: *Deleted

## 2014-07-30 DIAGNOSIS — Z23 Encounter for immunization: Secondary | ICD-10-CM

## 2014-07-31 ENCOUNTER — Encounter: Payer: Self-pay | Admitting: Internal Medicine

## 2014-07-31 ENCOUNTER — Ambulatory Visit (INDEPENDENT_AMBULATORY_CARE_PROVIDER_SITE_OTHER): Payer: Commercial Managed Care - HMO | Admitting: Internal Medicine

## 2014-07-31 VITALS — BP 118/84 | HR 70 | Temp 98.2°F | Resp 16 | Ht 74.25 in | Wt 201.0 lb

## 2014-07-31 DIAGNOSIS — Z1211 Encounter for screening for malignant neoplasm of colon: Secondary | ICD-10-CM

## 2014-07-31 DIAGNOSIS — K5900 Constipation, unspecified: Secondary | ICD-10-CM

## 2014-07-31 DIAGNOSIS — R1084 Generalized abdominal pain: Secondary | ICD-10-CM

## 2014-07-31 MED ORDER — HYDROCORTISONE 2.5 % RE CREA: TOPICAL_CREAM | RECTAL | Status: DC

## 2014-07-31 NOTE — Patient Instructions (Signed)
Constipation Constipation is when a person has fewer than three bowel movements a week, has difficulty having a bowel movement, or has stools that are dry, hard, or larger than normal. As people grow older, constipation is more common. If you try to fix constipation with medicines that make you have a bowel movement (laxatives), the problem may get worse. Long-term laxative use may cause the muscles of the colon to become weak. A low-fiber diet, not taking in enough fluids, and taking certain medicines may make constipation worse.  CAUSES   Certain medicines, such as antidepressants, pain medicine, iron supplements, antacids, and water pills.   Certain diseases, such as diabetes, irritable bowel syndrome (IBS), thyroid disease, or depression.   Not drinking enough water.   Not eating enough fiber-rich foods.   Stress or travel.   Lack of physical activity or exercise.   Ignoring the urge to have a bowel movement.   Using laxatives too much.  SIGNS AND SYMPTOMS   Having fewer than three bowel movements a week.   Straining to have a bowel movement.   Having stools that are hard, dry, or larger than normal.   Feeling full or bloated.   Pain in the lower abdomen.   Not feeling relief after having a bowel movement.  DIAGNOSIS  Your health care provider will take a medical history and perform a physical exam. Further testing may be done for severe constipation. Some tests may include:  A barium enema X-ray to examine your rectum, colon, and, sometimes, your small intestine.   A sigmoidoscopy to examine your lower colon.   A colonoscopy to examine your entire colon. TREATMENT  Treatment will depend on the severity of your constipation and what is causing it. Some dietary treatments include drinking more fluids and eating more fiber-rich foods. Lifestyle treatments may include regular exercise. If these diet and lifestyle recommendations do not help, your health care  provider may recommend taking over-the-counter laxative medicines to help you have bowel movements. Prescription medicines may be prescribed if over-the-counter medicines do not work.  HOME CARE INSTRUCTIONS   Eat foods that have a lot of fiber, such as fruits, vegetables, whole grains, and beans.  Limit foods high in fat and processed sugars, such as french fries, hamburgers, cookies, candies, and soda.   A fiber supplement may be added to your diet if you cannot get enough fiber from foods.   Drink enough fluids to keep your urine clear or pale yellow.   Exercise regularly or as directed by your health care provider.   Go to the restroom when you have the urge to go. Do not hold it.   Only take over-the-counter or prescription medicines as directed by your health care provider. Do not take other medicines for constipation without talking to your health care provider first.  [place] IF:   You have bright red blood in your stool.   Your constipation lasts for more than 4 days or gets worse.   You have abdominal or rectal pain.   You have thin, pencil-like stools.   You have unexplained weight loss. MAKE SURE YOU:   Understand these instructions.  Will watch your condition.  Will get help right away if you are not doing well or get worse.

## 2014-07-31 NOTE — Progress Notes (Signed)
   Subjective:    Patient ID: Mathew Miller, male    DOB: Dec 24, 1945, 68 y.o.   MRN: [id number]  HPI Patient presents with several week hx/o constipation and has tried a couple of OTC remedies including psyllium hulk, and an enema. He did have result with the enema earlier today which alleviated his abdominal discomfort. He has been having some postprandial bloating and sometimes goes several days w/o a BM. Denies upper GI   Meds/Allergies & PMH reviewed   Review of Systems noncontributory to above  Objective:   Physical Exam BP 118/84  Pulse 70  Temp(Src) 98.2 F (36.8 C) (Temporal)  Resp 16  Ht 6' 2.25" (1.886 m)  Wt 201 lb (91.173 kg)  BMI 25.63 kg/m2  HEENT - Eac's patent. TM's Nl. EOM's full. PERRLA. NasoOroPharynx clear. Neck - supple. Nl Thyroid. Carotids 2+ & No bruits, nodes, JVD Chest - Clear equal BS w/o Rales, rhonchi, wheezes. Cor - Nl HS. RRR w/o sig MGR. PP 1(+). No edema. Abd - No palpable organomegaly, masses or tenderness. BS nl. MS- FROM w/o deformities. Muscle power, tone and bulk Nl. Gait Nl. Neuro - No obvious Cr N abnormalities. Sensory, motor and Cerebellar functions appear Nl w/o focal abnormalities. Psyche - Mental status normal & appropriate.  No delusions, ideations or obvious mood abnormalities.  Assessment & Plan:   1. Generalized abdominal pain   2. Constipation, unspecified constipation type  3.  7 yrs overdue surveillance colonoscopy  - Sx's ans recc Miralax bid x 2 days , then Qd   -Recommend f/u Colonoscopy

## 2014-08-01 ENCOUNTER — Ambulatory Visit: Payer: Self-pay | Admitting: Internal Medicine

## 2014-08-01 ENCOUNTER — Encounter: Payer: Self-pay | Admitting: Gastroenterology

## 2014-08-19 ENCOUNTER — Ambulatory Visit (AMBULATORY_SURGERY_CENTER): Payer: Medicare Other | Admitting: *Deleted

## 2014-08-19 VITALS — Ht 74.0 in | Wt 202.2 lb

## 2014-08-19 DIAGNOSIS — Z1211 Encounter for screening for malignant neoplasm of colon: Secondary | ICD-10-CM

## 2014-08-19 MED ORDER — MOVIPREP 100 G PO SOLR: 1.0000 | Freq: Once | ORAL | Status: DC

## 2014-08-19 NOTE — Progress Notes (Signed)
No egg or soy allergy. ewm No heart issues. ewm No home 02 use. No diet pills. ewm No issues with the sedation with the last colon. ewm No blood thinners. ewm No emmi video. ewm

## 2014-08-29 ENCOUNTER — Other Ambulatory Visit: Payer: Self-pay | Admitting: *Deleted

## 2014-08-29 MED ORDER — ENALAPRIL MALEATE 20 MG PO TABS: ORAL_TABLET | ORAL | Status: DC

## 2014-08-29 MED ORDER — PRAVASTATIN SODIUM 40 MG PO TABS: 40.0000 mg | ORAL_TABLET | Freq: Every evening | ORAL | Status: DC

## 2014-08-29 MED ORDER — ATENOLOL 50 MG PO TABS: 50.0000 mg | ORAL_TABLET | Freq: Every day | ORAL | Status: DC

## 2014-09-02 ENCOUNTER — Other Ambulatory Visit: Payer: Self-pay | Admitting: Internal Medicine

## 2014-09-02 DIAGNOSIS — I10 Essential (primary) hypertension: Secondary | ICD-10-CM

## 2014-09-02 DIAGNOSIS — K219 Gastro-esophageal reflux disease without esophagitis: Secondary | ICD-10-CM

## 2014-09-02 MED ORDER — OMEPRAZOLE 20 MG PO CPDR: 20.0000 mg | DELAYED_RELEASE_CAPSULE | Freq: Two times a day (BID) | ORAL | Status: DC

## 2014-09-02 MED ORDER — ATENOLOL 50 MG PO TABS: 50.0000 mg | ORAL_TABLET | Freq: Every day | ORAL | Status: DC

## 2014-09-04 ENCOUNTER — Ambulatory Visit (AMBULATORY_SURGERY_CENTER): Payer: Commercial Managed Care - HMO | Admitting: Gastroenterology

## 2014-09-04 ENCOUNTER — Encounter: Payer: Self-pay | Admitting: Gastroenterology

## 2014-09-04 VITALS — BP 173/99 | HR 58 | Temp 97.7°F | Resp 13 | Ht 74.0 in | Wt 202.0 lb

## 2014-09-04 DIAGNOSIS — K5730 Diverticulosis of large intestine without perforation or abscess without bleeding: Secondary | ICD-10-CM

## 2014-09-04 DIAGNOSIS — Z1211 Encounter for screening for malignant neoplasm of colon: Secondary | ICD-10-CM

## 2014-09-04 DIAGNOSIS — D123 Benign neoplasm of transverse colon: Secondary | ICD-10-CM

## 2014-09-04 DIAGNOSIS — K648 Other hemorrhoids: Secondary | ICD-10-CM

## 2014-09-04 MED ORDER — SODIUM CHLORIDE 0.9 % IV SOLN: 500.0000 mL | INTRAVENOUS | Status: DC

## 2014-09-04 NOTE — Patient Instructions (Signed)
YOU HAD AN ENDOSCOPIC PROCEDURE TODAY AT THE [facility]: Refer to the procedure report that was given to you for any specific questions about what was found during the examination.  If the procedure report does not answer your questions, please call your gastroenterologist to clarify.  If you requested that your care partner not be given the details of your procedure findings, then the procedure report has been included in a sealed envelope for you to review at your convenience later.  YOU SHOULD EXPECT: Some feelings of bloating in the abdomen. Passage of more gas than usual.  Walking can help get rid of the air that was put into your GI tract during the procedure and reduce the bloating. If you had a lower endoscopy (such as a colonoscopy or flexible sigmoidoscopy) you may notice spotting of blood in your stool or on the toilet paper. If you underwent a bowel prep for your procedure, then you may not have a normal bowel movement for a few days.  DIET: Your first meal following the procedure should be a light meal and then it is ok to progress to your normal diet.  A half-sandwich or bowl of soup is an example of a good first meal.  Heavy or fried foods are harder to digest and may make you feel nauseous or bloated.  Likewise meals heavy in dairy and vegetables can cause extra gas to form and this can also increase the bloating.  Drink plenty of fluids but you should avoid alcoholic beverages for 24 hours.  ACTIVITY: Your care partner should take you home directly after the procedure.  You should plan to take it easy, moving slowly for the rest of the day.  You can resume normal activity the day after the procedure however you should NOT DRIVE or use heavy machinery for 24 hours (because of the sedation medicines used during the test).    SYMPTOMS TO REPORT IMMEDIATELY: A gastroenterologist can be reached at any hour.  During normal business hours, 8:30 AM to 5:00 PM Monday through Friday,  call [phone].  After hours and on weekends, please call the GI answering service at [phone] who will take a message and have the physician on call contact you.   Following lower endoscopy (colonoscopy or flexible sigmoidoscopy):  Excessive amounts of blood in the stool  Significant tenderness or worsening of abdominal pains  Swelling of the abdomen that is new, acute  Fever of 100F or higher   FOLLOW UP: If any biopsies were taken you will be contacted by phone or by letter within the next 1-3 weeks.  Call your gastroenterologist if you have not heard about the biopsies in 3 weeks.  Our staff will call the home number listed on your records the next business day following your procedure to check on you and address any questions or concerns that you may have at that time regarding the information given to you following your procedure. This is a courtesy call and so if there is no answer at the home number and we have not heard from you through the emergency physician on call, we will assume that you have returned to your regular daily activities without incident.  SIGNATURES/CONFIDENTIALITY: You and/or your care partner have signed paperwork which will be entered into your electronic medical record.  These signatures attest to the fact that that the information above on your After Visit Summary has been reviewed and is understood.  Full responsibility of the confidentiality of   this discharge information lies with you and/or your care-partner.    Resume medications. Information given on polyps, diverticulosis,hemorrhoids and high fiber diet with discharge instructions. 

## 2014-09-04 NOTE — Progress Notes (Signed)
Called to room to assist during endoscopic procedure.  Patient ID and intended procedure confirmed with present staff. Received instructions for my participation in the procedure from the performing physician.  

## 2014-09-04 NOTE — Op Note (Signed)
[facility]  [address] [place], [address]   COLONOSCOPY PROCEDURE REPORT  PATIENT: Mathew Miller, Mathew Miller  MR#: [id number] BIRTHDATE: 07/22/1946 , 65  yrs. old GENDER: male ENDOSCOPIST: Inda Castle, MD REFERRED JJ:HERDEYC Melford Aase, M.D. PROCEDURE DATE:  09/04/2014 PROCEDURE:   Colonoscopy with snare polypectomy First Screening Colonoscopy - Avg.  risk and is 50 yrs.  old or older - No.  Prior Negative Screening - Now for repeat screening. 10 or more years since last screening  History of Adenoma - Now for follow-up colonoscopy & has been > or = to 3 yrs.  N/A  Polyps Removed Today? Yes. ASA CLASS:   Class II INDICATIONS:average risk for colon cancer. MEDICATIONS: Propofol 150 mg IV  DESCRIPTION OF PROCEDURE:   After the risks benefits and alternatives of the procedure were thoroughly explained, informed consent was obtained.  The digital rectal exam revealed no abnormalities of the rectum.   The LB CF-H180AL Loaner [id number] endoscope was introduced through the anus and advanced to the cecum, which was identified by both the appendix and ileocecal valve. No adverse events experienced.   The quality of the prep was Suprep good  The instrument was then slowly withdrawn as the colon was fully examined.      COLON FINDINGS: A sessile polyp measuring 4 mm in size was found at the splenic flexure.  A polypectomy was performed with a cold snare.  The resection was complete, the polyp tissue was completely retrieved and sent to histology.   Internal hemorrhoids were found. There was mild diverticulosis noted in the descending colon and sigmoid colon.   The examination was otherwise normal.  Retroflexed views revealed no abnormalities. The time to cecum=5 minutes 13 seconds.  Withdrawal time=10 minutes 27 seconds.  The scope was withdrawn and the procedure completed. COMPLICATIONS: There were no immediate complications.  ENDOSCOPIC IMPRESSION: 1.   Sessile polyp  was found at the splenic flexure; polypectomy was performed with a cold snare 2.   Internal hemorrhoids 3.   Mild diverticulosis was noted in the descending colon and sigmoid colon 4.   The examination was otherwise normal  RECOMMENDATIONS: If the polyp(s) removed today are proven to be adenomatous (pre-cancerous) polyps, you will need a repeat colonoscopy in 5 years.  Otherwise you should continue to follow colorectal cancer screening guidelines for "routine risk" patients with colonoscopy in 10 years.  You will receive a letter within 1-2 weeks with the results of your biopsy as well as final recommendations.  Please call my office if you have not received a letter after 3 weeks.  eSigned:  Inda Castle, MD 09/04/2014 2:53 PM   cc:   PATIENT NAME:  Mathew Miller, Mathew Miller MR#: [id number]

## 2014-09-04 NOTE — Progress Notes (Signed)
Stable to RR 

## 2014-09-05 ENCOUNTER — Telehealth: Payer: Self-pay | Admitting: *Deleted

## 2014-09-05 NOTE — Telephone Encounter (Signed)
  Follow up Call-  Call back number 09/04/2014  Post procedure Call Back phone  # [phone] hm  Permission to leave phone message Yes     Patient questions: Spoke with wife Do you have a fever, pain , or abdominal swelling? No. Pain Score  0 *  Have you tolerated food without any problems? Yes.    Have you been able to return to your normal activities? Yes.    Do you have any questions about your discharge instructions: Diet   No. Medications  No. Follow up visit  No.  Do you have questions or concerns about your Care? No.  Actions: * If pain score is 4 or above: No action needed, pain <4.

## 2014-09-09 ENCOUNTER — Encounter: Payer: Self-pay | Admitting: Gastroenterology

## 2014-09-12 NOTE — Patient Instructions (Signed)

## 2014-09-13 NOTE — Progress Notes (Signed)
Patient ID: Mathew Miller, male   DOB: 08-25-1946, 68 y.o.   MRN: [id number]   This very nice 68 y.o.[name] presents for 3 month follow up with Hypertension, Hyperlipidemia, Pre-Diabetes and Vitamin D Deficiency.    Patient is treated for HTN (2000) & BP has been controlled at home. Today's BP: 132/84 mmHg. Patient has had no complaints of any cardiac type chest pain, palpitations, dyspnea/orthopnea/PND, dizziness, claudication, or dependent edema.    Hyperlipidemia is controlled with diet & meds. Patient denies myalgias or other med SE's. Last Lipids were at goal - Total Chol 150; HDL 59; LDL 75; Trig 80 on 06/12/2014.   Also, the patient has history of PreDiabetes with A1c 6.1% in Aug 2010 and has had no symptoms of reactive hypoglycemia, diabetic polys, paresthesias or visual blurring. Last A1c was 5.5% on  06/12/2014.   Further, the patient also has history of Vitamin D Deficiency (33 in 2008)  and supplements vitamin D without any suspected side-effects. Last vitamin D was 94 on 03/01/2014.    Medication List   enalapril 20 MG tablet  Commonly known as:  VASOTEC  TAKE ONE TABLET BY MOUTH ONCE DAILY     hydrocortisone 2.5 % rectal cream  Commonly known as:  ANUSOL-HC  Apply rectally 4 x daily as needed     Magnesium 250 MG Tabs  Take 250 mg by mouth daily.     MIRALAX PO  Take 17 g/day by mouth daily.     NONFORMULARY OR COMPOUNDED ITEM  Inject 0.4 mg into the skin as needed. Prostaglandin EL. 0.4 mm/20 mg as needed injected into the penis     omeprazole 20 MG capsule  Commonly known as:  PRILOSEC  Take 1 capsule (20 mg total) by mouth 2 (two) times daily.     pravastatin 40 MG tablet  Commonly known as:  PRAVACHOL  Take 1 tablet (40 mg total) by mouth every evening.     promethazine 25 MG tablet  Commonly known as:  PHENERGAN  Take 25 mg by mouth every 6 (six) hours as needed for nausea or vomiting.     PSYLLIUM HUSK PO  Take by mouth daily.     Vitamin D-3 5000 UNITS Tabs   Take 5,000 Units by mouth daily.     Allergies  Allergen Reactions  . Sudafed [Pseudoephedrine Hcl]     Urinary retention  . Tetanus Toxoids     Itching, burning feeling, ran a fever   PMHx:   Past Medical History  Diagnosis Date  . Hypertension   . Hyperlipidemia   . Vitamin D deficiency   . Prediabetes     no meds   . Constipation    Immunization History  Administered Date(s) Administered  . Influenza Split 08/06/2013  . Influenza, High Dose Seasonal PF 07/30/2014  . Pneumococcal Polysaccharide-23 10/21/2009  . Td 10/18/1965   Past Surgical History  Procedure Laterality Date  . No past surgeries    . Colonoscopy     FHx:    Reviewed / unchanged  SHx:    Reviewed / unchanged  Systems Review:  Constitutional: Denies fever, chills, wt changes, headaches, insomnia, fatigue, night sweats, change in appetite. Eyes: Denies redness, blurred vision, diplopia, discharge, itchy, watery eyes.  ENT: Denies discharge, congestion, post nasal drip, epistaxis, sore throat, earache, hearing loss, dental pain, tinnitus, vertigo, sinus pain, snoring.  CV: Denies chest pain, palpitations, irregular heartbeat, syncope, dyspnea, diaphoresis, orthopnea, PND, claudication or edema. Respiratory: denies cough, dyspnea,  DOE, pleurisy, hoarseness, laryngitis, wheezing.  Gastrointestinal: Denies dysphagia, odynophagia, heartburn, reflux, water brash, abdominal pain or cramps, nausea, vomiting, bloating, diarrhea, constipation, hematemesis, melena, hematochezia  or hemorrhoids. Genitourinary: Denies dysuria, frequency, urgency, nocturia, hesitancy, discharge, hematuria or flank pain. Musculoskeletal: Denies arthralgias, myalgias, stiffness, jt. swelling, pain, limping or strain/sprain.  Skin: Denies pruritus, rash, hives, warts, acne, eczema or change in skin lesion(s). Neuro: No weakness, tremor, incoordination, spasms, paresthesia or pain. Psychiatric: Denies confusion, memory loss or sensory  loss. Endo: Denies change in weight, skin or hair change.  Heme/Lymph: No excessive bleeding, bruising or enlarged lymph nodes.  Physical Exam  BP 132/84  Pulse 56  Temp 97.5 F   Resp 16  Ht 6' 2.5"   Wt 204 lb 9.6 oz   BMI 25.93   Appears well nourished and in no distress. Eyes: PERRLA, EOMs, conjunctiva no swelling or erythema. Sinuses: No frontal/maxillary tenderness ENT/Mouth: EAC's clear, TM's nl w/o erythema, bulging. Nares clear w/o erythema, swelling, exudates. Oropharynx clear without erythema or exudates. Oral hygiene is good. Tongue normal, non obstructing. Hearing intact.  Neck: Supple. Thyroid nl. Car 2+/2+ without bruits, nodes or JVD. Chest: Respirations nl with BS clear & equal w/o rales, rhonchi, wheezing or stridor.  Cor: Heart sounds normal w/ regular rate and rhythm without sig. murmurs, gallops, clicks, or rubs. Peripheral pulses normal and equal  without edema.  Abdomen: Soft & bowel sounds normal. Non-tender w/o guarding, rebound, hernias, masses, or organomegaly.  Lymphatics: Unremarkable.  Musculoskeletal: Full ROM all peripheral extremities, joint stability, 5/5 strength, and normal gait.  Skin: Warm, dry without exposed rashes, lesions or ecchymosis apparent.  Neuro: Cranial nerves intact, reflexes equal bilaterally. Sensory-motor testing grossly intact. Tendon reflexes grossly intact.  Pysch: Alert & oriented x 3.  Insight and judgement nl & appropriate. No ideations.  Assessment and Plan:  1. Hypertension - Continue monitor blood pressure at home. Continue diet/meds same.  2. Hyperlipidemia - Continue diet/meds, exercise,& lifestyle modifications. Continue monitor periodic cholesterol/liver & renal functions   3.  Pre-Diabetes - Continue diet, exercise, lifestyle modifications. Monitor appropriate labs.  4. Vitamin D Deficiency - Continue supplementation.   Recommended regular exercise, BP monitoring, weight control, and discussed med and SE's.  Recommended labs to assess and monitor clinical status. Further disposition pending results of labs.

## 2014-09-16 ENCOUNTER — Encounter: Payer: Self-pay | Admitting: Internal Medicine

## 2014-09-16 ENCOUNTER — Ambulatory Visit (INDEPENDENT_AMBULATORY_CARE_PROVIDER_SITE_OTHER): Payer: Commercial Managed Care - HMO | Admitting: Internal Medicine

## 2014-09-16 VITALS — BP 132/84 | HR 56 | Temp 97.5°F | Resp 16 | Ht 74.5 in | Wt 204.6 lb

## 2014-09-16 DIAGNOSIS — Z79899 Other long term (current) drug therapy: Secondary | ICD-10-CM

## 2014-09-16 DIAGNOSIS — E785 Hyperlipidemia, unspecified: Secondary | ICD-10-CM

## 2014-09-16 DIAGNOSIS — R7303 Prediabetes: Secondary | ICD-10-CM

## 2014-09-16 DIAGNOSIS — I10 Essential (primary) hypertension: Secondary | ICD-10-CM

## 2014-09-16 DIAGNOSIS — R7309 Other abnormal glucose: Secondary | ICD-10-CM

## 2014-09-16 DIAGNOSIS — E559 Vitamin D deficiency, unspecified: Secondary | ICD-10-CM

## 2014-09-16 MED ORDER — VITAMIN D3 125 MCG (5000 UT) PO CAPS: 5000.0000 [IU] | ORAL_CAPSULE | Freq: Every day | ORAL | Status: DC

## 2014-09-17 ENCOUNTER — Telehealth: Payer: Self-pay

## 2014-09-17 LAB — HEPATIC FUNCTION PANEL
ALT: 18 U/L (ref 0–53)
AST: 21 U/L (ref 0–37)
Albumin: 4.4 g/dL (ref 3.5–5.2)
Alkaline Phosphatase: 75 U/L (ref 39–117)
Bilirubin, Direct: 0.2 mg/dL (ref 0.0–0.3)
Indirect Bilirubin: 0.6 mg/dL (ref 0.2–1.2)
Total Bilirubin: 0.8 mg/dL (ref 0.2–1.2)
Total Protein: 6.8 g/dL (ref 6.0–8.3)

## 2014-09-17 LAB — CBC WITH DIFFERENTIAL/PLATELET
Basophils Absolute: 0 10*3/uL (ref 0.0–0.1)
Basophils Relative: 0 % (ref 0–1)
Eosinophils Absolute: 0.2 10*3/uL (ref 0.0–0.7)
Eosinophils Relative: 2 % (ref 0–5)
HCT: 48.6 % (ref 39.0–52.0)
Hemoglobin: 16.4 g/dL (ref 13.0–17.0)
Lymphocytes Relative: 23 % (ref 12–46)
Lymphs Abs: 1.8 10*3/uL (ref 0.7–4.0)
MCH: 30.3 pg (ref 26.0–34.0)
MCHC: 33.7 g/dL (ref 30.0–36.0)
MCV: 89.8 fL (ref 78.0–100.0)
MPV: 10.4 fL (ref 9.4–12.4)
Monocytes Absolute: 0.6 10*3/uL (ref 0.1–1.0)
Monocytes Relative: 8 % (ref 3–12)
Neutro Abs: 5.2 10*3/uL (ref 1.7–7.7)
Neutrophils Relative %: 67 % (ref 43–77)
Platelets: 192 10*3/uL (ref 150–400)
RBC: 5.41 MIL/uL (ref 4.22–5.81)
RDW: 14.2 % (ref 11.5–15.5)
WBC: 7.7 10*3/uL (ref 4.0–10.5)

## 2014-09-17 LAB — LIPID PANEL
Cholesterol: 157 mg/dL (ref 0–200)
HDL: 60 mg/dL (ref >39)
LDL Cholesterol: 86 mg/dL (ref 0–99)
Total CHOL/HDL Ratio: 2.6 Ratio
Triglycerides: 54 mg/dL (ref <150)
VLDL: 11 mg/dL (ref 0–40)

## 2014-09-17 LAB — BASIC METABOLIC PANEL WITH GFR
BUN: 13 mg/dL (ref 6–23)
CO2: 29 mEq/L (ref 19–32)
Calcium: 9.6 mg/dL (ref 8.4–10.5)
Chloride: 103 mEq/L (ref 96–112)
Creat: 1.1 mg/dL (ref 0.50–1.35)
GFR, Est African American: 78 mL/min
GFR, Est Non African American: 68 mL/min
Glucose, Bld: 82 mg/dL (ref 70–99)
Potassium: 4.3 mEq/L (ref 3.5–5.3)
Sodium: 143 mEq/L (ref 135–145)

## 2014-09-17 LAB — INSULIN, FASTING: Insulin fasting, serum: 5.6 u[IU]/mL (ref 2.0–19.6)

## 2014-09-17 LAB — HEMOGLOBIN A1C
Hgb A1c MFr Bld: 5.7 % — ABNORMAL HIGH (ref <5.7)
Mean Plasma Glucose: 117 mg/dL — ABNORMAL HIGH (ref <117)

## 2014-09-17 LAB — TSH: TSH: 3.906 u[IU]/mL (ref 0.350–4.500)

## 2014-09-17 LAB — VITAMIN D 25 HYDROXY (VIT D DEFICIENCY, FRACTURES): Vit D, 25-Hydroxy: 98 ng/mL (ref 30–100)

## 2014-09-17 LAB — MAGNESIUM: Magnesium: 2.3 mg/dL (ref 1.5–2.5)

## 2014-09-17 NOTE — Telephone Encounter (Signed)
Left message for patient to return my call for lab results. 

## 2014-09-17 NOTE — Telephone Encounter (Signed)
-----   Message from Unk Pinto, MD sent at 09/17/2014  8:27 AM EST ----- - CBC Kidneys Liver Mag Thyroid - all Nl/OK - Chol  157 -HDL 60 - LDL 86 - Great - keep up great work - A1c & insulin WNL - No Diabetes - Vit D 98 -great - Keep all meds same

## 2014-09-18 ENCOUNTER — Other Ambulatory Visit: Payer: Self-pay | Admitting: Internal Medicine

## 2014-09-18 DIAGNOSIS — E782 Mixed hyperlipidemia: Secondary | ICD-10-CM

## 2014-09-18 DIAGNOSIS — K219 Gastro-esophageal reflux disease without esophagitis: Secondary | ICD-10-CM

## 2014-09-18 DIAGNOSIS — I10 Essential (primary) hypertension: Secondary | ICD-10-CM

## 2014-09-18 MED ORDER — VITAMIN D3 125 MCG (5000 UT) PO CAPS: 5000.0000 [IU] | ORAL_CAPSULE | Freq: Every day | ORAL | Status: AC

## 2014-09-18 MED ORDER — ENALAPRIL MALEATE 20 MG PO TABS: ORAL_TABLET | ORAL | Status: DC

## 2014-09-18 MED ORDER — ATENOLOL 50 MG PO TABS: 50.0000 mg | ORAL_TABLET | Freq: Every day | ORAL | Status: DC

## 2014-09-18 MED ORDER — PRAVASTATIN SODIUM 40 MG PO TABS: 40.0000 mg | ORAL_TABLET | Freq: Every evening | ORAL | Status: DC

## 2014-09-18 MED ORDER — OMEPRAZOLE 20 MG PO CPDR: 20.0000 mg | DELAYED_RELEASE_CAPSULE | Freq: Two times a day (BID) | ORAL | Status: DC

## 2014-10-30 ENCOUNTER — Other Ambulatory Visit: Payer: Self-pay | Admitting: *Deleted

## 2014-10-30 DIAGNOSIS — E782 Mixed hyperlipidemia: Secondary | ICD-10-CM

## 2014-10-30 DIAGNOSIS — I10 Essential (primary) hypertension: Secondary | ICD-10-CM

## 2014-10-30 MED ORDER — PROMETHAZINE HCL 25 MG PO TABS: ORAL_TABLET | ORAL | Status: DC

## 2014-10-30 MED ORDER — PRAVASTATIN SODIUM 40 MG PO TABS: 40.0000 mg | ORAL_TABLET | Freq: Every evening | ORAL | Status: DC

## 2014-10-30 MED ORDER — ENALAPRIL MALEATE 20 MG PO TABS: ORAL_TABLET | ORAL | Status: DC

## 2014-11-26 ENCOUNTER — Other Ambulatory Visit: Payer: Self-pay | Admitting: *Deleted

## 2014-11-26 DIAGNOSIS — I10 Essential (primary) hypertension: Secondary | ICD-10-CM

## 2014-11-26 MED ORDER — ATENOLOL 50 MG PO TABS: 50.0000 mg | ORAL_TABLET | Freq: Every day | ORAL | Status: DC

## 2014-12-17 ENCOUNTER — Ambulatory Visit: Payer: Self-pay | Admitting: Physician Assistant

## 2014-12-30 ENCOUNTER — Ambulatory Visit: Payer: Self-pay | Admitting: Physician Assistant

## 2015-01-29 DIAGNOSIS — H1045 Other chronic allergic conjunctivitis: Secondary | ICD-10-CM | POA: Diagnosis not present

## 2015-02-11 ENCOUNTER — Other Ambulatory Visit: Payer: Self-pay | Admitting: *Deleted

## 2015-02-11 DIAGNOSIS — I10 Essential (primary) hypertension: Secondary | ICD-10-CM

## 2015-02-11 DIAGNOSIS — E782 Mixed hyperlipidemia: Secondary | ICD-10-CM

## 2015-02-11 MED ORDER — PRAVASTATIN SODIUM 40 MG PO TABS: 40.0000 mg | ORAL_TABLET | Freq: Every evening | ORAL | Status: DC

## 2015-02-11 MED ORDER — GLUCOSE BLOOD VI STRP: ORAL_STRIP | Status: DC

## 2015-02-11 MED ORDER — HYDROCORTISONE 2.5 % RE CREA: TOPICAL_CREAM | RECTAL | Status: AC

## 2015-02-11 MED ORDER — ACCU-CHEK NANO SMARTVIEW W/DEVICE KIT
PACK | Status: DC
Start: 2015-02-11 — End: 2016-12-09

## 2015-02-11 MED ORDER — ACCU-CHEK FASTCLIX LANCETS MISC
Status: DC
Start: 2015-02-11 — End: 2016-04-08

## 2015-02-11 MED ORDER — ATENOLOL 50 MG PO TABS: 50.0000 mg | ORAL_TABLET | Freq: Every day | ORAL | Status: DC

## 2015-02-11 MED ORDER — BD SWAB SINGLE USE REGULAR PADS: MEDICATED_PAD | Status: DC

## 2015-02-11 MED ORDER — ENALAPRIL MALEATE 20 MG PO TABS: ORAL_TABLET | ORAL | Status: DC

## 2015-02-25 DIAGNOSIS — H5210 Myopia, unspecified eye: Secondary | ICD-10-CM | POA: Diagnosis not present

## 2015-02-25 DIAGNOSIS — H5203 Hypermetropia, bilateral: Secondary | ICD-10-CM | POA: Diagnosis not present

## 2015-03-03 DIAGNOSIS — R351 Nocturia: Secondary | ICD-10-CM | POA: Diagnosis not present

## 2015-03-03 DIAGNOSIS — N5201 Erectile dysfunction due to arterial insufficiency: Secondary | ICD-10-CM | POA: Diagnosis not present

## 2015-03-03 DIAGNOSIS — R972 Elevated prostate specific antigen [PSA]: Secondary | ICD-10-CM | POA: Diagnosis not present

## 2015-03-03 DIAGNOSIS — N401 Enlarged prostate with lower urinary tract symptoms: Secondary | ICD-10-CM | POA: Diagnosis not present

## 2015-03-05 ENCOUNTER — Encounter: Payer: Self-pay | Admitting: Internal Medicine

## 2015-04-01 ENCOUNTER — Other Ambulatory Visit: Payer: Self-pay | Admitting: *Deleted

## 2015-04-01 DIAGNOSIS — I10 Essential (primary) hypertension: Secondary | ICD-10-CM

## 2015-04-01 MED ORDER — ATENOLOL 50 MG PO TABS: 50.0000 mg | ORAL_TABLET | Freq: Every day | ORAL | Status: DC

## 2015-04-07 ENCOUNTER — Encounter: Payer: Self-pay | Admitting: Internal Medicine

## 2015-04-07 ENCOUNTER — Ambulatory Visit (INDEPENDENT_AMBULATORY_CARE_PROVIDER_SITE_OTHER): Payer: Commercial Managed Care - HMO | Admitting: Internal Medicine

## 2015-04-07 VITALS — BP 142/80 | HR 56 | Temp 97.8°F | Resp 16 | Ht 73.75 in | Wt 202.2 lb

## 2015-04-07 DIAGNOSIS — Z6825 Body mass index (BMI) 25.0-25.9, adult: Secondary | ICD-10-CM | POA: Diagnosis not present

## 2015-04-07 DIAGNOSIS — E559 Vitamin D deficiency, unspecified: Secondary | ICD-10-CM

## 2015-04-07 DIAGNOSIS — Z1389 Encounter for screening for other disorder: Secondary | ICD-10-CM | POA: Diagnosis not present

## 2015-04-07 DIAGNOSIS — Z0001 Encounter for general adult medical examination with abnormal findings: Secondary | ICD-10-CM | POA: Diagnosis not present

## 2015-04-07 DIAGNOSIS — Z9181 History of falling: Secondary | ICD-10-CM

## 2015-04-07 DIAGNOSIS — Z0100 Encounter for examination of eyes and vision without abnormal findings: Secondary | ICD-10-CM | POA: Diagnosis not present

## 2015-04-07 DIAGNOSIS — Z79899 Other long term (current) drug therapy: Secondary | ICD-10-CM | POA: Diagnosis not present

## 2015-04-07 DIAGNOSIS — R7309 Other abnormal glucose: Secondary | ICD-10-CM

## 2015-04-07 DIAGNOSIS — Z1331 Encounter for screening for depression: Secondary | ICD-10-CM

## 2015-04-07 DIAGNOSIS — I10 Essential (primary) hypertension: Secondary | ICD-10-CM | POA: Diagnosis not present

## 2015-04-07 DIAGNOSIS — Z789 Other specified health status: Secondary | ICD-10-CM | POA: Diagnosis not present

## 2015-04-07 DIAGNOSIS — R6889 Other general symptoms and signs: Secondary | ICD-10-CM

## 2015-04-07 DIAGNOSIS — Z125 Encounter for screening for malignant neoplasm of prostate: Secondary | ICD-10-CM | POA: Diagnosis not present

## 2015-04-07 DIAGNOSIS — R7303 Prediabetes: Secondary | ICD-10-CM

## 2015-04-07 DIAGNOSIS — Z1212 Encounter for screening for malignant neoplasm of rectum: Secondary | ICD-10-CM

## 2015-04-07 DIAGNOSIS — E785 Hyperlipidemia, unspecified: Secondary | ICD-10-CM | POA: Diagnosis not present

## 2015-04-07 LAB — BASIC METABOLIC PANEL WITH GFR
BUN: 16 mg/dL (ref 6–23)
CO2: 26 mEq/L (ref 19–32)
Calcium: 9.3 mg/dL (ref 8.4–10.5)
Chloride: 105 mEq/L (ref 96–112)
Creat: 1.1 mg/dL (ref 0.50–1.35)
GFR, Est African American: 78 mL/min
GFR, Est Non African American: 67 mL/min
Glucose, Bld: 114 mg/dL — ABNORMAL HIGH (ref 70–99)
Potassium: 4.1 mEq/L (ref 3.5–5.3)
Sodium: 144 mEq/L (ref 135–145)

## 2015-04-07 LAB — LIPID PANEL
Cholesterol: 128 mg/dL (ref 0–200)
HDL: 48 mg/dL (ref >=40)
LDL Cholesterol: 69 mg/dL (ref 0–99)
Total CHOL/HDL Ratio: 2.7 Ratio
Triglycerides: 54 mg/dL (ref <150)
VLDL: 11 mg/dL (ref 0–40)

## 2015-04-07 LAB — CBC WITH DIFFERENTIAL/PLATELET
Basophils Absolute: 0 10*3/uL (ref 0.0–0.1)
Basophils Relative: 0 % (ref 0–1)
Eosinophils Absolute: 0.2 10*3/uL (ref 0.0–0.7)
Eosinophils Relative: 2 % (ref 0–5)
HCT: 46.8 % (ref 39.0–52.0)
Hemoglobin: 16 g/dL (ref 13.0–17.0)
Lymphocytes Relative: 17 % (ref 12–46)
Lymphs Abs: 1.8 10*3/uL (ref 0.7–4.0)
MCH: 30.4 pg (ref 26.0–34.0)
MCHC: 34.2 g/dL (ref 30.0–36.0)
MCV: 88.8 fL (ref 78.0–100.0)
MPV: 10.2 fL (ref 8.6–12.4)
Monocytes Absolute: 0.6 10*3/uL (ref 0.1–1.0)
Monocytes Relative: 6 % (ref 3–12)
Neutro Abs: 7.7 10*3/uL (ref 1.7–7.7)
Neutrophils Relative %: 75 % (ref 43–77)
Platelets: 177 10*3/uL (ref 150–400)
RBC: 5.27 MIL/uL (ref 4.22–5.81)
RDW: 14.2 % (ref 11.5–15.5)
WBC: 10.3 10*3/uL (ref 4.0–10.5)

## 2015-04-07 LAB — TSH: TSH: 2.787 u[IU]/mL (ref 0.350–4.500)

## 2015-04-07 LAB — HEPATIC FUNCTION PANEL
ALT: 21 U/L (ref 0–53)
AST: 16 U/L (ref 0–37)
Albumin: 4 g/dL (ref 3.5–5.2)
Alkaline Phosphatase: 76 U/L (ref 39–117)
Bilirubin, Direct: 0.2 mg/dL (ref 0.0–0.3)
Indirect Bilirubin: 0.6 mg/dL (ref 0.2–1.2)
Total Bilirubin: 0.8 mg/dL (ref 0.2–1.2)
Total Protein: 6.2 g/dL (ref 6.0–8.3)

## 2015-04-07 LAB — HEMOGLOBIN A1C
Hgb A1c MFr Bld: 5.5 % (ref <5.7)
Mean Plasma Glucose: 111 mg/dL (ref <117)

## 2015-04-07 LAB — MAGNESIUM: Magnesium: 2 mg/dL (ref 1.5–2.5)

## 2015-04-07 NOTE — Progress Notes (Signed)
Patient ID: Mathew Miller, male   DOB: 16-Nov-1945, 68 y.o.   MRN: [id number]  [facility] VISIT AND CPE  Assessment:   1. Essential hypertension  - EKG 12-Lead - [place], RETROPERITNL ABD,  LTD - TSH - Microalbumin / creatinine urine ratio; Future - Microalbumin / creatinine urine ratio  2. Hyperlipidemia  - Lipid panel  3. Prediabetes  - Hemoglobin A1c - Insulin, random  4. Vitamin D deficiency  - Vit D  25 hydroxy (rtn osteoporosis monitoring)  5. Screening for rectal cancer  - POC Hemoccult Bld/Stl (3-Cd Home Screen); Future  6. Prostate cancer screening  - PSA  7. Depression screen   8. At low risk for fall   9. BMI 25.0-25.9,adult   10. Medication management  - CBC with Differential/Platelet - BASIC METABOLIC PANEL WITH GFR - Hepatic function panel - Magnesium - Urine Microscopic; Future - Urine Microscopic  Plan:   During the course of the visit the patient was educated and counseled about appropriate screening and preventive services including:    Pneumococcal vaccine   Influenza vaccine  Td vaccine  Screening electrocardiogram  Bone densitometry screening  Colorectal cancer screening  Diabetes screening  Glaucoma screening  Nutrition counseling   Advanced directives: requested  Screening recommendations, referrals: Vaccinations: Immunization History  Administered Date(s) Administered  . Influenza Split 08/06/2013  . Influenza, High Dose Seasonal PF 07/30/2014  . Pneumococcal Polysaccharide-23 10/21/2009  . Td 10/18/1965  Prevnar vaccine declined Shingles vaccine 2013 Hep B vaccine not indicated  Nutrition assessed and recommended  Colonoscopy 09/04/2014 Recommended yearly ophthalmology/optometry visit for glaucoma screening and checkup Recommended yearly dental visit for hygiene and checkup Advanced directives - yes  Conditions/risks identified: BMI: Discussed weight loss, diet, and increase physical  activity.  Increase physical activity: AHA recommends 150 minutes of physical activity a week.  Medications reviewed PreDiabetes is not at goal, ACE/ARB therapy: Yes. Urinary Incontinence is not an issue: discussed non pharmacology and pharmacology options.  Fall risk: low- discussed PT, home fall assessment, medications.   Subjective:    Mathew Miller  presents for Medicare Annual Wellness Visit and complete physical.  Date of last medicare wellness visit was 06/12/2014.  This very nice 69 y.o. [name] presents also for CPE with Hypertension, Hyperlipidemia, Pre-Diabetes and Vitamin D Deficiency.    Patient is treated for HTN & BP has been controlled at home. Today's BP: (!) 142/80 mmHg. Patient has had no complaints of any cardiac type chest pain, palpitations, dyspnea/orthopnea/PND, dizziness, claudication, or dependent edema.   Hyperlipidemia is controlled with diet & meds. Patient denies myalgias or other med SE's. Last Lipids were Chol 157; HDL 60; LDL  86; Trig 54 on 09/16/2014.   Also, the patient has history of PreDiabetes and has had no symptoms of reactive hypoglycemia, diabetic polys, paresthesias or visual blurring.  Last A1c was  5.7% on 09/16/2014.    Further, the patient also has history of Vitamin D Deficiency and supplements vitamin D without any suspected side-effects. Last vitamin D was  98 on  09/16/2014.  Names of Other Physician/Practitioners you currently use: 1. [facility] here for primary care 2. Dr Harriette Bouillon, OD, eye doctor, last visit May 2016 3. Dr Valma Cava, DDS, dentist, last visit - every 6 months  Patient Care Team: Lucky Cowboy, MD as PCP - General (Internal Medicine) Louis Meckel, MD as Consulting Physician (Gastroenterology) Jethro Bolus, MD as Consulting Physician (Urology) Louis Meckel, MD as Consulting Physician (  Gastroenterology) Glenford Peers, OD as Referring Physician (Optometry)  Medication  Review: Medication Sig  . atenolol (TENORMIN) 50 MG tablet Take 1 tablet (50 mg total) by mouth daily.  Marland Kitchen VITAMIN D 5000 UNITS  Take 5,000 Units by mouth daily.  . enalapril (VASOTEC) 20 MG tablet TAKE ONE TABLET BY MOUTH ONCE DAILY  . hydrocortisone (ANUSOL-HC) 2.5 % rectal cream Apply rectally 4 x daily as needed  . Magnesium 250 MG TABS Take 250 mg by mouth daily.  . Prostaglandin EL.   0.4 mm/20 mg injected into penis  . omeprazole  20 MG capsule Take 1 capsule (20 mg total) by mouth 2 (two) times daily.  Marland Kitchen MIRALAX  Take 17 g/day by mouth daily.  . pravastatin  40 MG tablet Take 1 tablet (40 mg total) by mouth every evening.  . promethazine  25 MG  Takes 1 tablet every 4 hours as needed for nausea or vomititng.  . PSYLLIUM HUSK PO Take by mouth daily.   Current Problems (verified) Patient Active Problem List   Diagnosis Date Noted  . Vitamin D deficiency 03/01/2014  . Medication management 03/01/2014  . Hyperlipidemia   . Prediabetes   . HTN (hypertension) 10/09/2013   Screening Tests Health Maintenance  Topic Date Due  . TETANUS/TDAP  10/19/1975  . PNA vac Low Risk Adult (2 of 2 - PCV13) 10/21/2010  . INFLUENZA VACCINE  05/19/2015  . COLONOSCOPY  09/05/2019  . ZOSTAVAX  Addressed   Immunization History  Administered Date(s) Administered  . Influenza Split 08/06/2013  . Influenza, High Dose Seasonal PF 07/30/2014  . Pneumococcal Polysaccharide-23 10/21/2009  . Td 10/18/1965   Preventative care: Last colonoscopy: 09/04/2014  History reviewed: allergies, current medications, past family history, past medical history, past social history, past surgical history and problem list  Risk Factors: Tobacco History  Substance Use Topics  . Smoking status: Never Smoker   . Smokeless tobacco: Never Used  . Alcohol Use: 0.0 oz/week    0 Standard drinks or equivalent per week     Comment: Rare /Beer.wine. not regularly   He does not smoke.  Patient is not a former  smoker. Are there smokers in your home (other than you)?  No  Alcohol Current alcohol use: none  Caffeine Current caffeine use: coffee 2 cups /day  Exercise Current exercise: walking and roewing machine  Nutrition/Diet Current diet: in general, a "healthy" diet    Cardiac risk factors: advanced age (older than 62 for men, 31 for women), dyslipidemia, hypertension and male gender.  Depression Screen (Note: if answer to either of the following is "Yes", a more complete depression screening is indicated)   Q1: Over the past two weeks, have you felt down, depressed or hopeless? No  Q2: Over the past two weeks, have you felt little interest or pleasure in doing things? No  Have you lost interest or pleasure in daily life? No  Do you often feel hopeless? No  Do you cry easily over simple problems? No  Activities of Daily Living In your present state of health, do you have any difficulty performing the following activities?:  Driving? No Managing money?  No Feeding yourself? No Getting from bed to chair? No Climbing a flight of stairs? No Preparing food and eating?: No Bathing or showering? No Getting dressed: No Getting to the toilet? No Using the toilet:No Moving around from place to place: No In the past year have you fallen or had a near fall?:No   Are  you sexually active?  Yes  Do you have more than one partner?  No  Vision Difficulties: No  Hearing Difficulties: No Do you often ask people to speak up or repeat themselves? No Do you experience ringing or noises in your ears? No Do you have difficulty understanding soft or whispered voices? No  Cognition  Do you feel that you have a problem with memory?No  Do you often misplace items? No  Do you feel safe at home?  Yes  Advanced directives Does patient have a Health Care Power of Attorney? Yes Does patient have a Living Will? Yes  Past Medical History  Diagnosis Date  . Hypertension   . Hyperlipidemia   .  Vitamin D deficiency   . Prediabetes     no meds   . Constipation     Past Surgical History  Procedure Laterality Date  . No past surgeries    . Colonoscopy      ROS: Constitutional: Denies fever, chills, weight loss/gain, headaches, insomnia, fatigue, night sweats or change in appetite. Eyes: Denies redness, blurred vision, diplopia, discharge, itchy or watery eyes.  ENT: Denies discharge, congestion, post nasal drip, epistaxis, sore throat, earache, hearing loss, dental pain, Tinnitus, Vertigo, Sinus pain or snoring.  Cardio: Denies chest pain, palpitations, irregular heartbeat, syncope, dyspnea, diaphoresis, orthopnea, PND, claudication or edema Respiratory: denies cough, dyspnea, DOE, pleurisy, hoarseness, laryngitis or wheezing.  Gastrointestinal: Denies dysphagia, heartburn, reflux, water brash, pain, cramps, nausea, vomiting, bloating, diarrhea, constipation, hematemesis, melena, hematochezia, jaundice or hemorrhoids Genitourinary: Denies dysuria, frequency, urgency, nocturia, hesitancy, discharge, hematuria or flank pain Musculoskeletal: Denies arthralgia, myalgia, stiffness, Jt. Swelling, pain, limp or strain/sprain. Denies Falls. Skin: Denies puritis, rash, hives, warts, acne, eczema or change in skin lesion Neuro: No weakness, tremor, incoordination, spasms, paresthesia or pain Psychiatric: Denies confusion, memory loss or sensory loss. Denies Depression. Endocrine: Denies change in weight, skin, hair change, nocturia, and paresthesia, diabetic polys, visual blurring or hyper / hypo glycemic episodes.  Heme/Lymph: No excessive bleeding, bruising or enlarged lymph nodes.  Objective:     BP 142/80  Pulse 56  Temp 97.8 F   Resp 16  Ht 6' 1.75"   Wt 202 lb 3.2 oz     BMI 26.14  General Appearance:  Alert  WD/WN, male  in no apparent distress. Eyes: PERRLA, EOMs nl, conjunctiva normal, normal fundi and vessels. Sinuses: No frontal/maxillary tenderness ENT/Mouth: EACs  patent / TMs  nl. Nares clear without erythema, swelling, mucoid exudates. Oral hygiene is good. No erythema, swelling, or exudate. Tongue normal, non-obstructing. Tonsils not swollen or erythematous. Hearing normal.  Neck: Supple, thyroid normal. No bruits, nodes or JVD. Respiratory: Respiratory effort normal.  BS equal and clear bilateral without rales, rhonci, wheezing or stridor. Cardio: Heart sounds are normal with regular rate and rhythm and no murmurs, rubs or gallops. Peripheral pulses are normal and equal bilaterally without edema. No aortic or femoral bruits. Chest: symmetric with normal excursions and percussion.  Abdomen: Flat, soft, with nl bowel sounds. Nontender, no guarding, rebound, hernias, masses, or organomegaly.  Lymphatics: Non tender without lymphadenopathy.  Genitourinary: Done recently by Dr Patsi Sears. Musculoskeletal: Full ROM all peripheral extremities, joint stability, 5/5 strength, and normal gait. Skin: Warm and dry without rashes, lesions, cyanosis, clubbing or  ecchymosis.  Neuro: Cranial nerves intact, reflexes equal bilaterally. Normal muscle tone, no cerebellar symptoms. Sensation intact.  Pysch: Alert and oriented X 3 with normal affect, insight and judgment appropriate.   Cognitive Testing  Alert? Yes  Normal Appearance? Yes  Oriented to person? Yes  Place? Yes   Time? Yes  Recall of three objects?  Yes  Can perform simple calculations? Yes  Displays appropriate judgment? Yes  Can read the correct time from a watch/clock? Yes  Medicare Attestation I have personally reviewed: The patient's medical and social history Their use of alcohol, tobacco or illicit drugs Their current medications and supplements The patient's functional ability including ADLs,fall risks, home safety risks, cognitive, and hearing and visual impairment Diet and physical activities Evidence for depression or mood disorders  The patient's weight, height, BMI, and visual acuity  have been recorded in the chart.  I have made referrals, counseling, and provided education to the patient based on review of the above and I have provided the patient with a written personalized care plan for preventive services.  Over 40 minutes of exam, counseling, chart review was performed.  Lovada Barwick DAVID, MD   04/07/2015

## 2015-04-07 NOTE — Patient Instructions (Signed)

## 2015-04-08 LAB — URINALYSIS, MICROSCOPIC ONLY
Bacteria, UA: NONE SEEN
Casts: NONE SEEN
Crystals: NONE SEEN
Squamous Epithelial / LPF: NONE SEEN

## 2015-04-08 LAB — VITAMIN D 25 HYDROXY (VIT D DEFICIENCY, FRACTURES): Vit D, 25-Hydroxy: 104 ng/mL — ABNORMAL HIGH (ref 30–100)

## 2015-04-08 LAB — MICROALBUMIN / CREATININE URINE RATIO
Creatinine, Urine: 196.8 mg/dL
Microalb Creat Ratio: 11.2 mg/g (ref 0.0–30.0)
Microalb, Ur: 2.2 mg/dL — ABNORMAL HIGH (ref <2.0)

## 2015-04-08 LAB — INSULIN, RANDOM: Insulin: 52.3 u[IU]/mL — ABNORMAL HIGH (ref 2.0–19.6)

## 2015-04-08 LAB — PSA: PSA: 4.1 ng/mL — ABNORMAL HIGH (ref <=4.00)

## 2015-04-14 ENCOUNTER — Other Ambulatory Visit: Payer: Self-pay

## 2015-04-14 DIAGNOSIS — K219 Gastro-esophageal reflux disease without esophagitis: Secondary | ICD-10-CM

## 2015-04-14 MED ORDER — OMEPRAZOLE 20 MG PO CPDR: 20.0000 mg | DELAYED_RELEASE_CAPSULE | Freq: Two times a day (BID) | ORAL | Status: DC

## 2015-06-25 ENCOUNTER — Encounter: Payer: Self-pay | Admitting: Internal Medicine

## 2015-06-25 ENCOUNTER — Ambulatory Visit (INDEPENDENT_AMBULATORY_CARE_PROVIDER_SITE_OTHER): Payer: Commercial Managed Care - HMO | Admitting: Internal Medicine

## 2015-06-25 VITALS — BP 118/74 | HR 62 | Temp 98.0°F | Resp 16 | Ht 73.75 in | Wt 198.0 lb

## 2015-06-25 DIAGNOSIS — L259 Unspecified contact dermatitis, unspecified cause: Secondary | ICD-10-CM | POA: Diagnosis not present

## 2015-06-25 MED ORDER — PREDNISONE 20 MG PO TABS: ORAL_TABLET | ORAL | Status: DC

## 2015-06-25 MED ORDER — TRIAMCINOLONE ACETONIDE 0.1 % EX CREA: 1.0000 "application " | TOPICAL_CREAM | Freq: Three times a day (TID) | CUTANEOUS | Status: DC

## 2015-06-25 MED ORDER — DOXYCYCLINE HYCLATE 100 MG PO CAPS: 100.0000 mg | ORAL_CAPSULE | Freq: Two times a day (BID) | ORAL | Status: DC

## 2015-06-25 NOTE — Patient Instructions (Signed)

## 2015-06-25 NOTE — Progress Notes (Signed)
   Subjective:    Patient ID: Mathew Miller, male    DOB: [birth year], 69 y.o.   MRN: [id number]  Rash Pertinent negatives include no fatigue, fever or shortness of breath.  Patient reports that he has had a rash for the last week that has been spreading across the bilateral legs and waste.  He reports that the rash is not itching.  He does state that now it is filling with fluids.  He has had no worsening factors.  He reports that he has been using calamine, betadine, and also some hydroxazine.  He has not taken it.  He reports no changes in products.  They were out in the yard in shorts but he is not aware of any exposure to a rash.  Patient also reports that he did have a tick bite approximately 1 month ago and his neighbor has been diagnosed with RMSF.  The tick was on there for approximately 2 weeks.      Review of Systems  Constitutional: Negative for fever, chills and fatigue.  Respiratory: Negative for chest tightness and shortness of breath.   Cardiovascular: Negative for chest pain and palpitations.  Skin: Positive for rash.       Objective:   Physical Exam  Constitutional: He is oriented to person, place, and time. He appears well-developed and well-nourished. No distress.  HENT:  Head: Normocephalic.  Mouth/Throat: Oropharynx is clear and moist. No oropharyngeal exudate.  Eyes: Conjunctivae are normal. No scleral icterus.  Neck: Normal range of motion. Neck supple. No JVD present. No thyromegaly present.  Cardiovascular: Normal rate, regular rhythm, normal heart sounds and intact distal pulses.   Pulmonary/Chest: Effort normal and breath sounds normal. No respiratory distress. He has no wheezes. He has no rales. He exhibits no tenderness.  Abdominal: Soft. Bowel sounds are normal. He exhibits no distension and no mass. There is no tenderness. There is no rebound and no guarding.  Musculoskeletal: Normal range of motion.  Lymphadenopathy:    He has no cervical adenopathy.   Neurological: He is oriented to person, place, and time.  Skin: Skin is warm and dry. Rash noted. Rash is vesicular. He is not diaphoretic.  Vessicular rash located along bilateral ankles and bilateral quadriceps and groin.  Rash with erythematous base and no weeping, purpura, or petchia.    Psychiatric: He has a normal mood and affect. His behavior is normal. Judgment and thought content normal.  Nursing note and vitals reviewed.   Filed Vitals:   06/25/15 0958  BP: 118/74  Pulse: 62  Temp: 98 F (36.7 C)  Resp: 16          Assessment & Plan:    1. Contact dermatitis  - doxycycline (VIBRAMYCIN) 100 MG capsule; Take 1 capsule (100 mg total) by mouth 2 (two) times daily. One po bid x 14 days  Dispense: 28 capsule; Refill: 0 - predniSONE (DELTASONE) 20 MG tablet; 3 tabs po day one, then 2 tabs daily x 4 days  Dispense: 11 tablet; Refill: 0 - triamcinolone cream (KENALOG) 0.1 %; Apply 1 application topically 3 (three) times daily.  Dispense: 30 g; Refill: 0

## 2015-07-04 ENCOUNTER — Telehealth: Payer: Self-pay | Admitting: *Deleted

## 2015-07-04 NOTE — Telephone Encounter (Signed)
Patient's wife called with concerns about patient's rash.  She notes she changed laundry soap and thinks that has caused patient's rash.  States she has tried to "re-wash clothes, bedding and towels and has noticed a decrease in patient's symptoms.  Wife wanted to be sure we do not recommend any other changes or Rx for patient, other then washing clothes in previous laundry soap.  Per Starlyn Skeans, PA-C, she suggests patient takes OTC Benadryl to help relieve symptoms of rash and itching and call if any further concerns or questions.

## 2015-07-09 ENCOUNTER — Ambulatory Visit: Payer: Self-pay | Admitting: Internal Medicine

## 2015-07-25 DIAGNOSIS — H1045 Other chronic allergic conjunctivitis: Secondary | ICD-10-CM | POA: Diagnosis not present

## 2015-07-29 ENCOUNTER — Ambulatory Visit (INDEPENDENT_AMBULATORY_CARE_PROVIDER_SITE_OTHER): Payer: Commercial Managed Care - HMO | Admitting: Internal Medicine

## 2015-07-29 ENCOUNTER — Encounter: Payer: Self-pay | Admitting: Internal Medicine

## 2015-07-29 VITALS — BP 124/66 | HR 56 | Temp 97.8°F | Resp 16 | Ht 73.75 in | Wt 199.0 lb

## 2015-07-29 DIAGNOSIS — I10 Essential (primary) hypertension: Secondary | ICD-10-CM

## 2015-07-29 DIAGNOSIS — E559 Vitamin D deficiency, unspecified: Secondary | ICD-10-CM | POA: Diagnosis not present

## 2015-07-29 DIAGNOSIS — R7309 Other abnormal glucose: Secondary | ICD-10-CM | POA: Diagnosis not present

## 2015-07-29 DIAGNOSIS — Z79899 Other long term (current) drug therapy: Secondary | ICD-10-CM

## 2015-07-29 DIAGNOSIS — Z23 Encounter for immunization: Secondary | ICD-10-CM

## 2015-07-29 DIAGNOSIS — R7303 Prediabetes: Secondary | ICD-10-CM | POA: Diagnosis not present

## 2015-07-29 DIAGNOSIS — E785 Hyperlipidemia, unspecified: Secondary | ICD-10-CM

## 2015-07-29 DIAGNOSIS — L259 Unspecified contact dermatitis, unspecified cause: Secondary | ICD-10-CM

## 2015-07-29 LAB — CBC WITH DIFFERENTIAL/PLATELET
Basophils Absolute: 0 10*3/uL (ref 0.0–0.1)
Basophils Relative: 0 % (ref 0–1)
Eosinophils Absolute: 0.1 10*3/uL (ref 0.0–0.7)
Eosinophils Relative: 1 % (ref 0–5)
HCT: 45.9 % (ref 39.0–52.0)
Hemoglobin: 16 g/dL (ref 13.0–17.0)
Lymphocytes Relative: 15 % (ref 12–46)
Lymphs Abs: 1.2 10*3/uL (ref 0.7–4.0)
MCH: 30.4 pg (ref 26.0–34.0)
MCHC: 34.9 g/dL (ref 30.0–36.0)
MCV: 87.3 fL (ref 78.0–100.0)
MPV: 9.7 fL (ref 8.6–12.4)
Monocytes Absolute: 0.6 10*3/uL (ref 0.1–1.0)
Monocytes Relative: 8 % (ref 3–12)
Neutro Abs: 6 10*3/uL (ref 1.7–7.7)
Neutrophils Relative %: 76 % (ref 43–77)
Platelets: 180 10*3/uL (ref 150–400)
RBC: 5.26 MIL/uL (ref 4.22–5.81)
RDW: 14.6 % (ref 11.5–15.5)
WBC: 7.9 10*3/uL (ref 4.0–10.5)

## 2015-07-29 LAB — BASIC METABOLIC PANEL WITH GFR
BUN: 14 mg/dL (ref 7–25)
CO2: 31 mmol/L (ref 20–31)
Calcium: 9.2 mg/dL (ref 8.6–10.3)
Chloride: 103 mmol/L (ref 98–110)
Creat: 1.12 mg/dL (ref 0.70–1.18)
GFR, Est African American: 76 mL/min (ref >=60)
GFR, Est Non African American: 66 mL/min (ref >=60)
Glucose, Bld: 90 mg/dL (ref 65–99)
Potassium: 4.3 mmol/L (ref 3.5–5.3)
Sodium: 139 mmol/L (ref 135–146)

## 2015-07-29 LAB — LIPID PANEL
Cholesterol: 140 mg/dL (ref 125–200)
HDL: 51 mg/dL (ref >=40)
LDL Cholesterol: 73 mg/dL (ref <130)
Total CHOL/HDL Ratio: 2.7 Ratio (ref <=5.0)
Triglycerides: 82 mg/dL (ref <150)
VLDL: 16 mg/dL (ref <30)

## 2015-07-29 LAB — HEPATIC FUNCTION PANEL
ALT: 24 U/L (ref 9–46)
AST: 20 U/L (ref 10–35)
Albumin: 3.9 g/dL (ref 3.6–5.1)
Alkaline Phosphatase: 76 U/L (ref 40–115)
Bilirubin, Direct: 0.1 mg/dL (ref <=0.2)
Indirect Bilirubin: 0.6 mg/dL (ref 0.2–1.2)
Total Bilirubin: 0.7 mg/dL (ref 0.2–1.2)
Total Protein: 5.8 g/dL — ABNORMAL LOW (ref 6.1–8.1)

## 2015-07-29 LAB — TSH: TSH: 3.04 u[IU]/mL (ref 0.350–4.500)

## 2015-07-29 MED ORDER — ENALAPRIL MALEATE 10 MG PO TABS: ORAL_TABLET | ORAL | Status: DC

## 2015-07-29 MED ORDER — PREDNISONE 20 MG PO TABS: ORAL_TABLET | ORAL | Status: DC

## 2015-07-29 NOTE — Progress Notes (Signed)
Patient ID: Mathew Miller, male   DOB: 1946-01-16, 69 y.o.   MRN: [id number]  Assessment and Plan:  Hypertension:  -cut enalapril in half not sure if weakness spells are from low BP or vagal episodes after eating -Continue medication,  -monitor blood pressure at home.  -Continue DASH diet.   -Reminder to go to the ER if any CP, SOB, nausea, dizziness, severe HA, changes vision/speech, left arm numbness and tingling, and jaw pain.  Cholesterol: -Continue diet and exercise.  -Check cholesterol.   Pre-diabetes: -Continue diet and exercise.  -Check A1C  Vitamin D Def: -check level -continue medications.   Rash -contact dermatitis from change in laundry detergent -appears vessicular in nature if no improvement with change to dreft and prednisone needs to probably see derm -prednisone sent in   Continue diet and meds as discussed. Further disposition pending results of labs.  HPI 69 y.o. male  presents for 3 month follow up with hypertension, hyperlipidemia, prediabetes and vitamin D.   His blood pressure has been controlled at home, today their BP is BP: 124/66 mmHg.   He does workout. He denies chest pain, shortness of breath, dizziness.   He is on cholesterol medication and denies myalgias. His cholesterol is at goal. The cholesterol last visit was:   Lab Results  Component Value Date   CHOL 128 04/07/2015   HDL 48 04/07/2015   LDLCALC 69 04/07/2015   TRIG 54 04/07/2015   CHOLHDL 2.7 04/07/2015     He has been working on diet and exercise for prediabetes, and denies foot ulcerations, hyperglycemia, hypoglycemia , increased appetite, nausea, paresthesia of the feet, polydipsia, polyuria, visual disturbances, vomiting and weight loss. Last A1C in the office was:  Lab Results  Component Value Date   HGBA1C 5.5 04/07/2015  Blood sugar  Patient is on Vitamin D supplement.  Lab Results  Component Value Date   VD25OH 104* 04/07/2015      Patient reports that he is still  having vessicles that have been coming back.  He reports that the rash got better.  He did have a change of Tide detergent and they did add febreeze to it.  He reports that they changed to the Tide sensitive.  He had this happen 20 years ago when the detergents changed.  He reports that they had to use a cream to help with.    Occasionally he has been having low blood pressure and he gets shakey and gets weak and can barely get out of the car.    Current Medications:  Current Outpatient Prescriptions on File Prior to Visit  Medication Sig Dispense Refill  . ACCU-CHEK FASTCLIX LANCETS MISC Check blood sugar 1 time daily--DX  R73.09 102 each 12  . Alcohol Swabs (B-D SINGLE USE SWABS REGULAR) PADS Check blood sugar 1 time daily 100 each 11  . atenolol (TENORMIN) 50 MG tablet Take 1 tablet (50 mg total) by mouth daily. 90 tablet 0  . Blood Glucose Monitoring Suppl (ACCU-CHEK NANO SMARTVIEW) W/DEVICE KIT Check blood sugar 1 time daily-DX R73.09 1 kit 0  . Cholecalciferol (VITAMIN D3) 5000 UNITS CAPS Take 5,000 Units by mouth daily. 90 capsule 99  . enalapril (VASOTEC) 20 MG tablet TAKE ONE TABLET BY MOUTH ONCE DAILY 90 tablet 1  . glucose blood (ACCU-CHEK SMARTVIEW) test strip Check blood 1 time daily--DX  R73.09 100 each 12  . hydrocortisone (ANUSOL-HC) 2.5 % rectal cream Apply rectally 4 x daily as needed 30 g 4  .  Magnesium 250 MG TABS Take 250 mg by mouth daily.    . NONFORMULARY OR COMPOUNDED ITEM Inject 0.4 mg into the skin as needed. Prostaglandin EL. 0.4 mm/20 mg as needed injected into the penis    . omeprazole (PRILOSEC) 20 MG capsule Take 1 capsule (20 mg total) by mouth 2 (two) times daily. 180 capsule 1  . PAZEO 0.7 % SOLN     . Polyethylene Glycol 3350 (MIRALAX PO) Take 17 g/day by mouth daily.    . pravastatin (PRAVACHOL) 40 MG tablet Take 1 tablet (40 mg total) by mouth every evening. 90 tablet 1  . promethazine (PHENERGAN) 25 MG tablet Takes 1 tablet every 4 hours as needed for  nausea or vomititng. 100 tablet 0  . PSYLLIUM HUSK PO Take by mouth daily.    Marland Kitchen triamcinolone cream (KENALOG) 0.1 % Apply 1 application topically 3 (three) times daily. 30 g 0   No current facility-administered medications on file prior to visit.    Medical History:  Past Medical History  Diagnosis Date  . Hypertension   . Hyperlipidemia   . Vitamin D deficiency   . Prediabetes     no meds   . Constipation     Allergies:  Allergies  Allergen Reactions  . Sudafed [Pseudoephedrine Hcl]     Urinary retention  . Tetanus Toxoids     Itching, burning feeling, ran a fever     Review of Systems:  Review of Systems  Constitutional: Negative for fever, chills and malaise/fatigue.  HENT: Negative for congestion, ear pain and sore throat.   Eyes: Negative.   Respiratory: Negative for cough, shortness of breath and wheezing.   Cardiovascular: Negative for chest pain, palpitations and leg swelling.  Gastrointestinal: Negative for heartburn, diarrhea, constipation, blood in stool and melena.  Genitourinary: Negative.   Neurological: Positive for dizziness and focal weakness. Negative for sensory change, speech change, seizures, loss of consciousness and headaches.  Psychiatric/Behavioral: Negative for depression. The patient is not nervous/anxious and does not have insomnia.     Family history- Review and unchanged  Social history- Review and unchanged  Physical Exam: BP 124/66 mmHg  Pulse 56  Temp(Src) 97.8 F (36.6 C) (Temporal)  Resp 16  Ht 6' 1.75" (1.873 m)  Wt 199 lb (90.266 kg)  BMI 25.73 kg/m2 Wt Readings from Last 3 Encounters:  07/29/15 199 lb (90.266 kg)  06/25/15 198 lb (89.812 kg)  04/07/15 202 lb 3.2 oz (91.717 kg)    General Appearance: Well nourished well developed, in no apparent distress. Eyes: PERRLA, EOMs, conjunctiva no swelling or erythema ENT/Mouth: Ear canals normal without obstruction, swelling, erythma, discharge.  TMs normal bilaterally.   Oropharynx moist, clear, without exudate, or postoropharyngeal swelling. Neck: Supple, thyroid normal,no cervical adenopathy  Respiratory: Respiratory effort normal, Breath sounds clear A&P without rhonchi, wheeze, or rale.  No retractions, no accessory usage. Cardio: RRR with no MRGs. Brisk peripheral pulses without edema.  Abdomen: Soft, + BS,  Non tender, no guarding, rebound, hernias, masses. Musculoskeletal: Full ROM, 5/5 strength, Normal gait Skin: Warm, dry without rashes, lesions, ecchymosis.  Isolated vessicles across the bilateral feet and ankle.  No redness erythema or excoriation.    Neuro: Awake and oriented X 3, Cranial nerves intact. Normal muscle tone, no cerebellar symptoms. Psych: Normal affect, Insight and Judgment appropriate.    Starlyn Skeans, PA-C 10:36 AM [facility]

## 2015-07-29 NOTE — Patient Instructions (Addendum)
Please cut your enalapril in half to 10 mg instead of 20 mg daily.  Please monitor your blood pressure at home and let me know if it is consistently running less than 120/80.    Please take the prednisone until it is gone.  If the rash does not go away after you have changed to the laundry detergent to dreft than I need to get you into a dermatologist.    You can use either sweet oil or debrox to help clean the earwax out of your ear.  NO QTIPS!!!!

## 2015-07-30 LAB — HEMOGLOBIN A1C
Hgb A1c MFr Bld: 5.6 % (ref <5.7)
Mean Plasma Glucose: 114 mg/dL (ref <117)

## 2015-08-20 ENCOUNTER — Other Ambulatory Visit: Payer: Self-pay | Admitting: Internal Medicine

## 2015-08-27 ENCOUNTER — Other Ambulatory Visit: Payer: Self-pay | Admitting: Internal Medicine

## 2015-08-27 DIAGNOSIS — L259 Unspecified contact dermatitis, unspecified cause: Secondary | ICD-10-CM

## 2015-08-27 MED ORDER — TRIAMCINOLONE ACETONIDE 0.1 % EX CREA: 1.0000 "application " | TOPICAL_CREAM | Freq: Three times a day (TID) | CUTANEOUS | Status: DC

## 2015-09-05 ENCOUNTER — Other Ambulatory Visit: Payer: Self-pay | Admitting: Internal Medicine

## 2015-09-20 ENCOUNTER — Other Ambulatory Visit: Payer: Self-pay | Admitting: Internal Medicine

## 2015-09-22 ENCOUNTER — Other Ambulatory Visit: Payer: Self-pay | Admitting: Internal Medicine

## 2015-10-15 ENCOUNTER — Ambulatory Visit: Payer: Self-pay | Admitting: Internal Medicine

## 2015-10-19 HISTORY — PX: CATARACT EXTRACTION, BILATERAL: SHX1313

## 2015-10-31 ENCOUNTER — Ambulatory Visit: Payer: Self-pay | Admitting: Internal Medicine

## 2015-11-21 ENCOUNTER — Other Ambulatory Visit: Payer: Self-pay | Admitting: Internal Medicine

## 2015-11-28 ENCOUNTER — Ambulatory Visit: Payer: Self-pay | Admitting: Internal Medicine

## 2015-12-02 DIAGNOSIS — H903 Sensorineural hearing loss, bilateral: Secondary | ICD-10-CM | POA: Diagnosis not present

## 2015-12-05 ENCOUNTER — Ambulatory Visit: Payer: Self-pay | Admitting: Internal Medicine

## 2015-12-15 ENCOUNTER — Other Ambulatory Visit: Payer: Self-pay | Admitting: Internal Medicine

## 2015-12-16 ENCOUNTER — Ambulatory Visit (INDEPENDENT_AMBULATORY_CARE_PROVIDER_SITE_OTHER): Payer: Commercial Managed Care - HMO | Admitting: Internal Medicine

## 2015-12-16 ENCOUNTER — Encounter: Payer: Self-pay | Admitting: Internal Medicine

## 2015-12-16 VITALS — BP 142/80 | HR 56 | Temp 97.5°F | Resp 16 | Ht 73.75 in | Wt 204.6 lb

## 2015-12-16 DIAGNOSIS — R7303 Prediabetes: Secondary | ICD-10-CM | POA: Diagnosis not present

## 2015-12-16 DIAGNOSIS — Z79899 Other long term (current) drug therapy: Secondary | ICD-10-CM

## 2015-12-16 DIAGNOSIS — E785 Hyperlipidemia, unspecified: Secondary | ICD-10-CM

## 2015-12-16 DIAGNOSIS — I10 Essential (primary) hypertension: Secondary | ICD-10-CM

## 2015-12-16 DIAGNOSIS — Z0001 Encounter for general adult medical examination with abnormal findings: Secondary | ICD-10-CM

## 2015-12-16 DIAGNOSIS — E559 Vitamin D deficiency, unspecified: Secondary | ICD-10-CM | POA: Diagnosis not present

## 2015-12-16 DIAGNOSIS — R6889 Other general symptoms and signs: Secondary | ICD-10-CM | POA: Diagnosis not present

## 2015-12-16 DIAGNOSIS — R7309 Other abnormal glucose: Secondary | ICD-10-CM | POA: Diagnosis not present

## 2015-12-16 LAB — CBC WITH DIFFERENTIAL/PLATELET
Basophils Absolute: 0 10*3/uL (ref 0.0–0.1)
Basophils Relative: 0 % (ref 0–1)
Eosinophils Absolute: 0.1 10*3/uL (ref 0.0–0.7)
Eosinophils Relative: 1 % (ref 0–5)
HCT: 48 % (ref 39.0–52.0)
Hemoglobin: 16 g/dL (ref 13.0–17.0)
Lymphocytes Relative: 20 % (ref 12–46)
Lymphs Abs: 1.6 10*3/uL (ref 0.7–4.0)
MCH: 29.5 pg (ref 26.0–34.0)
MCHC: 33.3 g/dL (ref 30.0–36.0)
MCV: 88.6 fL (ref 78.0–100.0)
MPV: 10.3 fL (ref 8.6–12.4)
Monocytes Absolute: 0.7 10*3/uL (ref 0.1–1.0)
Monocytes Relative: 9 % (ref 3–12)
Neutro Abs: 5.7 10*3/uL (ref 1.7–7.7)
Neutrophils Relative %: 70 % (ref 43–77)
Platelets: 171 10*3/uL (ref 150–400)
RBC: 5.42 MIL/uL (ref 4.22–5.81)
RDW: 14.4 % (ref 11.5–15.5)
WBC: 8.1 10*3/uL (ref 4.0–10.5)

## 2015-12-16 NOTE — Progress Notes (Signed)
rectal cream Apply rectally 4 x daily as needed  . Magnesium 250 MG TABS Take 250 mg  by mouth daily.  . Prostaglandin EL. 0.4 mm/20 mg Inject 0.4 mg into the skin as needed injected into the penis  . omeprazole  20 MG capsule TAKE 1 CAPSULE TWICE DAILY  . PAZEO 0.7 % SOLN   . MIRALAX  Take 17 g/day by mouth daily.  . pravastatin40 MG  Take 1 tablet (40 mg total) by mouth every evening.  . promethazine 25 MG  Takes 1 tablet every 4 hours as needed   . PSYLLIUM HUSK PO Take by mouth daily.  Marland Kitchen triamcinolone cream0.1 % Apply sparingly 2 to 3 x day (do not use on face)   Allergies  Allergen Reactions  . Sudafed [Pseudoephedrine Hcl]     Urinary retention  . Tetanus Toxoids     Itching, burning feeling, ran a fever   Current Problems (verified) Patient Active Problem List   Diagnosis Date Noted  . Vitamin D deficiency 03/01/2014  . Medication management 03/01/2014  . Hyperlipidemia   . Prediabetes   . HTN (hypertension) 10/09/2013   Screening Tests Health Maintenance  Topic Date Due  . PNA vac Low Risk Adult (2 of 2 - PCV13) 10/21/2010  . Hepatitis C Screening  08/31/2023 (Originally 20-May-1946)  . TETANUS/TDAP  08/26/2027 (Originally 10/19/1975)  . INFLUENZA VACCINE  05/18/2016  . COLONOSCOPY  09/05/2019  . ZOSTAVAX  Addressed   Immunization History  Administered Date(s) Administered  . Influenza Split 08/06/2013  . Influenza, High Dose Seasonal PF 07/30/2014, 07/29/2015  . Pneumococcal Polysaccharide-23 10/21/2009  . Td 10/18/1965  . Zoster 10/19/2011   Preventative care: Last colonoscopy: 09/04/2014 - Dr Deatra Ina  Past Medical History  Diagnosis Date  . Hypertension   . Hyperlipidemia   . Vitamin D deficiency   . Prediabetes     no meds   . Constipation     Past Surgical History  Procedure Laterality Date  . No past surgeries    . Colonoscopy     Risk Factors: Tobacco Social History  Substance Use Topics  . Smoking status: Never Smoker   . Smokeless tobacco: Never Used  . Alcohol Use: 0.0 oz/week    0 Standard drinks or equivalent per week      Comment: very Rare /Beer.wine.   He does not smoke.  Patient is not a former smoker. Are there smokers in your home (other than you)?  No  Alcohol Current alcohol use: rare  Caffeine Current caffeine use: coffee 2 cups /day  Exercise Current exercise: walking  Nutrition/Diet Current diet: in general, a "healthy" diet    Cardiac risk factors: advanced age (older than 45 for men, 3 for women), dyslipidemia, hypertension, male gender and sedentary lifestyle.  Depression Screen (Note: if answer to either of the following is "Yes", a more complete depression screening is indicated)   Q1: Over the past two weeks, have you felt down, depressed or hopeless? No  Q2: Over the past two weeks, have you felt little interest or pleasure in doing things? No  Have you lost interest or pleasure in daily life? No  Do you often feel hopeless? No  Do you cry easily over simple problems? No  Activities of Daily Living In your present state of health, do you have any difficulty performing the following activities?:  Driving? No Managing money?  No Feeding yourself? No Getting from bed to chair? No Climbing a flight of stairs? No  rectal cream Apply rectally 4 x daily as needed  . Magnesium 250 MG TABS Take 250 mg  by mouth daily.  . Prostaglandin EL. 0.4 mm/20 mg Inject 0.4 mg into the skin as needed injected into the penis  . omeprazole  20 MG capsule TAKE 1 CAPSULE TWICE DAILY  . PAZEO 0.7 % SOLN   . MIRALAX  Take 17 g/day by mouth daily.  . pravastatin40 MG  Take 1 tablet (40 mg total) by mouth every evening.  . promethazine 25 MG  Takes 1 tablet every 4 hours as needed   . PSYLLIUM HUSK PO Take by mouth daily.  Marland Kitchen triamcinolone cream0.1 % Apply sparingly 2 to 3 x day (do not use on face)   Allergies  Allergen Reactions  . Sudafed [Pseudoephedrine Hcl]     Urinary retention  . Tetanus Toxoids     Itching, burning feeling, ran a fever   Current Problems (verified) Patient Active Problem List   Diagnosis Date Noted  . Vitamin D deficiency 03/01/2014  . Medication management 03/01/2014  . Hyperlipidemia   . Prediabetes   . HTN (hypertension) 10/09/2013   Screening Tests Health Maintenance  Topic Date Due  . PNA vac Low Risk Adult (2 of 2 - PCV13) 10/21/2010  . Hepatitis C Screening  08/31/2023 (Originally 20-May-1946)  . TETANUS/TDAP  08/26/2027 (Originally 10/19/1975)  . INFLUENZA VACCINE  05/18/2016  . COLONOSCOPY  09/05/2019  . ZOSTAVAX  Addressed   Immunization History  Administered Date(s) Administered  . Influenza Split 08/06/2013  . Influenza, High Dose Seasonal PF 07/30/2014, 07/29/2015  . Pneumococcal Polysaccharide-23 10/21/2009  . Td 10/18/1965  . Zoster 10/19/2011   Preventative care: Last colonoscopy: 09/04/2014 - Dr Deatra Ina  Past Medical History  Diagnosis Date  . Hypertension   . Hyperlipidemia   . Vitamin D deficiency   . Prediabetes     no meds   . Constipation     Past Surgical History  Procedure Laterality Date  . No past surgeries    . Colonoscopy     Risk Factors: Tobacco Social History  Substance Use Topics  . Smoking status: Never Smoker   . Smokeless tobacco: Never Used  . Alcohol Use: 0.0 oz/week    0 Standard drinks or equivalent per week      Comment: very Rare /Beer.wine.   He does not smoke.  Patient is not a former smoker. Are there smokers in your home (other than you)?  No  Alcohol Current alcohol use: rare  Caffeine Current caffeine use: coffee 2 cups /day  Exercise Current exercise: walking  Nutrition/Diet Current diet: in general, a "healthy" diet    Cardiac risk factors: advanced age (older than 45 for men, 3 for women), dyslipidemia, hypertension, male gender and sedentary lifestyle.  Depression Screen (Note: if answer to either of the following is "Yes", a more complete depression screening is indicated)   Q1: Over the past two weeks, have you felt down, depressed or hopeless? No  Q2: Over the past two weeks, have you felt little interest or pleasure in doing things? No  Have you lost interest or pleasure in daily life? No  Do you often feel hopeless? No  Do you cry easily over simple problems? No  Activities of Daily Living In your present state of health, do you have any difficulty performing the following activities?:  Driving? No Managing money?  No Feeding yourself? No Getting from bed to chair? No Climbing a flight of stairs? No  rectal cream Apply rectally 4 x daily as needed  . Magnesium 250 MG TABS Take 250 mg  by mouth daily.  . Prostaglandin EL. 0.4 mm/20 mg Inject 0.4 mg into the skin as needed injected into the penis  . omeprazole  20 MG capsule TAKE 1 CAPSULE TWICE DAILY  . PAZEO 0.7 % SOLN   . MIRALAX  Take 17 g/day by mouth daily.  . pravastatin40 MG  Take 1 tablet (40 mg total) by mouth every evening.  . promethazine 25 MG  Takes 1 tablet every 4 hours as needed   . PSYLLIUM HUSK PO Take by mouth daily.  Marland Kitchen triamcinolone cream0.1 % Apply sparingly 2 to 3 x day (do not use on face)   Allergies  Allergen Reactions  . Sudafed [Pseudoephedrine Hcl]     Urinary retention  . Tetanus Toxoids     Itching, burning feeling, ran a fever   Current Problems (verified) Patient Active Problem List   Diagnosis Date Noted  . Vitamin D deficiency 03/01/2014  . Medication management 03/01/2014  . Hyperlipidemia   . Prediabetes   . HTN (hypertension) 10/09/2013   Screening Tests Health Maintenance  Topic Date Due  . PNA vac Low Risk Adult (2 of 2 - PCV13) 10/21/2010  . Hepatitis C Screening  08/31/2023 (Originally 20-May-1946)  . TETANUS/TDAP  08/26/2027 (Originally 10/19/1975)  . INFLUENZA VACCINE  05/18/2016  . COLONOSCOPY  09/05/2019  . ZOSTAVAX  Addressed   Immunization History  Administered Date(s) Administered  . Influenza Split 08/06/2013  . Influenza, High Dose Seasonal PF 07/30/2014, 07/29/2015  . Pneumococcal Polysaccharide-23 10/21/2009  . Td 10/18/1965  . Zoster 10/19/2011   Preventative care: Last colonoscopy: 09/04/2014 - Dr Deatra Ina  Past Medical History  Diagnosis Date  . Hypertension   . Hyperlipidemia   . Vitamin D deficiency   . Prediabetes     no meds   . Constipation     Past Surgical History  Procedure Laterality Date  . No past surgeries    . Colonoscopy     Risk Factors: Tobacco Social History  Substance Use Topics  . Smoking status: Never Smoker   . Smokeless tobacco: Never Used  . Alcohol Use: 0.0 oz/week    0 Standard drinks or equivalent per week      Comment: very Rare /Beer.wine.   He does not smoke.  Patient is not a former smoker. Are there smokers in your home (other than you)?  No  Alcohol Current alcohol use: rare  Caffeine Current caffeine use: coffee 2 cups /day  Exercise Current exercise: walking  Nutrition/Diet Current diet: in general, a "healthy" diet    Cardiac risk factors: advanced age (older than 45 for men, 3 for women), dyslipidemia, hypertension, male gender and sedentary lifestyle.  Depression Screen (Note: if answer to either of the following is "Yes", a more complete depression screening is indicated)   Q1: Over the past two weeks, have you felt down, depressed or hopeless? No  Q2: Over the past two weeks, have you felt little interest or pleasure in doing things? No  Have you lost interest or pleasure in daily life? No  Do you often feel hopeless? No  Do you cry easily over simple problems? No  Activities of Daily Living In your present state of health, do you have any difficulty performing the following activities?:  Driving? No Managing money?  No Feeding yourself? No Getting from bed to chair? No Climbing a flight of stairs? No  Patient ID: Mathew Miller, male   DOB: 03-08-1946, 70 y.o.   MRN: [id number]     Medicare Annual Wellness Visit and  Comprehensive Evaluation & Examination    Assessment:   1. Essential hypertension  - TSH  2. Hyperlipidemia  - Lipid panel - TSH  3. Prediabetes  - Hemoglobin A1c - Insulin, random  4. Vitamin D deficiency  - VITAMIN D 25 Hydroxy (Vit-D Deficiency, Fractures)  5. Medication management  - CBC with Differential/Platelet - BASIC METABOLIC PANEL WITH GFR - Hepatic function panel - Magnesium  Plan:   During the course of the visit the patient was educated and counseled about appropriate screening and preventive services including:    Pneumococcal vaccine   Influenza vaccine  Td vaccine  Screening electrocardiogram  Bone densitometry screening  Colorectal cancer screening  Diabetes screening  Glaucoma screening  Nutrition counseling   Advanced directives: requested  Screening recommendations, referrals: Vaccinations: Immunization History  Administered Date(s) Administered  . Influenza Split 08/06/2013  . Influenza, High Dose Seasonal PF 07/30/2014, 07/29/2015  . Pneumococcal Polysaccharide-23 10/21/2009  . Td 10/18/1965  . Zoster 10/19/2011    Tdap vaccine "intolerant" Influenza vaccine 07/29/2015 Pneumococcal vaccine 10/21/2009 Prevnar vaccine undecided Shingles vaccine 2013 at [place] B vaccine not indicated  Nutrition assessed and recommended  Colonoscopy 111/18/2015 - Dr Deatra Ina Recommended yearly ophthalmology/optometry visit for glaucoma screening and checkup Recommended yearly dental visit for hygiene and checkup Advanced directives - Yes  Conditions/risks identified: BMI: Discussed weight loss, diet, and increase physical activity.  Increase physical activity: AHA recommends 150 minutes of physical activity a week.  Medications reviewed PreDiabetes is at goal, ACE/ARB therapy: Yes. Urinary  Incontinence is not an issue: discussed non pharmacology and pharmacology options.  Fall risk: low- discussed PT, home fall assessment, medications.   Subjective:    HIXON SANDO is  presents for an Annual Wellness Visit and comprehensive evaluation of multiple co-morbid conditions.  Date of last medicare wellness visit was Aug 2015.  Patient is treated for HTN since 2000 & BP has been controlled at home. Today's BP is 142/80. Patient has had no complaints of any cardiac type chest pain, palpitations, dyspnea/orthopnea/PND, dizziness, claudication, or dependent edema.   Hyperlipidemia is controlled with diet & meds. Patient denies myalgias or other med SE's. Last Lipids were Cholesterol 140; HDL 51; LDL 73; Triglycerides 82 on 07/29/2015.   Also, the patient has history of PreDiabetes since 2010 with A1c 6.1% in August and the 5.8% in 2015  and has had no symptoms of reactive hypoglycemia, diabetic polys, paresthesias or visual blurring.  Last A1c was 5.6% on 07/29/2015.    Further, the patient also has history of Vitamin D Deficiency of "27" in 2008 and supplements vitamin D without any suspected side-effects. Last vitamin D was 104 on  04/07/2015.   Names of Other Physician/Practitioners you currently use: 1. [facility] here for primary care 2. Dr Webb Laws, [place], eye doctor, last visit May 2016 3. Dr Baldo Ash, [place], dentist, last visit 2016 & every 6 months  Patient Care Team: Unk Pinto, MD as PCP - General (Internal Medicine) Inda Castle, MD as Consulting Physician (Gastroenterology) Carolan Clines, MD as Consulting Physician (Urology) Webb Laws, OD as Referring Physician (Optometry)  Medication Review: Medication Sig  . atenolol  50 MG tablet TAKE 1 TABLET EVERY DAY  . enalapril  20 MG tablet TAKE 1 TABLET EVERY DAY  . ANUSOL-HC 2.5 %

## 2015-12-16 NOTE — Patient Instructions (Signed)

## 2015-12-17 LAB — HEMOGLOBIN A1C
Hgb A1c MFr Bld: 5.6 % (ref <5.7)
Mean Plasma Glucose: 114 mg/dL (ref <117)

## 2015-12-17 LAB — HEPATIC FUNCTION PANEL
ALT: 16 U/L (ref 9–46)
AST: 17 U/L (ref 10–35)
Albumin: 4 g/dL (ref 3.6–5.1)
Alkaline Phosphatase: 72 U/L (ref 40–115)
Bilirubin, Direct: 0.1 mg/dL (ref <=0.2)
Indirect Bilirubin: 0.6 mg/dL (ref 0.2–1.2)
Total Bilirubin: 0.7 mg/dL (ref 0.2–1.2)
Total Protein: 6.2 g/dL (ref 6.1–8.1)

## 2015-12-17 LAB — BASIC METABOLIC PANEL WITH GFR
BUN: 22 mg/dL (ref 7–25)
CO2: 28 mmol/L (ref 20–31)
Calcium: 9.6 mg/dL (ref 8.6–10.3)
Chloride: 105 mmol/L (ref 98–110)
Creat: 1.16 mg/dL (ref 0.70–1.18)
GFR, Est African American: 72 mL/min (ref >=60)
GFR, Est Non African American: 63 mL/min (ref >=60)
Glucose, Bld: 100 mg/dL — ABNORMAL HIGH (ref 65–99)
Potassium: 4.4 mmol/L (ref 3.5–5.3)
Sodium: 143 mmol/L (ref 135–146)

## 2015-12-17 LAB — LIPID PANEL
Cholesterol: 145 mg/dL (ref 125–200)
HDL: 50 mg/dL (ref >=40)
LDL Cholesterol: 78 mg/dL (ref <130)
Total CHOL/HDL Ratio: 2.9 Ratio (ref <=5.0)
Triglycerides: 84 mg/dL (ref <150)
VLDL: 17 mg/dL (ref <30)

## 2015-12-17 LAB — TSH: TSH: 2.55 mIU/L (ref 0.40–4.50)

## 2015-12-17 LAB — MAGNESIUM: Magnesium: 2.2 mg/dL (ref 1.5–2.5)

## 2015-12-17 LAB — VITAMIN D 25 HYDROXY (VIT D DEFICIENCY, FRACTURES): Vit D, 25-Hydroxy: 91 ng/mL (ref 30–100)

## 2015-12-17 LAB — INSULIN, RANDOM: Insulin: 14.3 u[IU]/mL (ref 2.0–19.6)

## 2015-12-23 ENCOUNTER — Other Ambulatory Visit: Payer: Self-pay | Admitting: *Deleted

## 2015-12-23 MED ORDER — ATENOLOL 50 MG PO TABS: 50.0000 mg | ORAL_TABLET | Freq: Every day | ORAL | Status: DC

## 2016-01-05 ENCOUNTER — Other Ambulatory Visit: Payer: Self-pay | Admitting: Internal Medicine

## 2016-01-08 ENCOUNTER — Other Ambulatory Visit: Payer: Self-pay | Admitting: Internal Medicine

## 2016-01-26 ENCOUNTER — Other Ambulatory Visit: Payer: Self-pay | Admitting: *Deleted

## 2016-01-26 MED ORDER — ATENOLOL 50 MG PO TABS: 50.0000 mg | ORAL_TABLET | Freq: Every day | ORAL | Status: DC

## 2016-03-22 ENCOUNTER — Other Ambulatory Visit: Payer: Self-pay | Admitting: Internal Medicine

## 2016-03-30 ENCOUNTER — Other Ambulatory Visit: Payer: Self-pay | Admitting: Internal Medicine

## 2016-04-08 ENCOUNTER — Other Ambulatory Visit: Payer: Self-pay | Admitting: *Deleted

## 2016-04-08 MED ORDER — GLUCOSE BLOOD VI STRP: ORAL_STRIP | Status: DC

## 2016-04-08 MED ORDER — ACCU-CHEK FASTCLIX LANCETS MISC: Status: DC

## 2016-04-09 ENCOUNTER — Other Ambulatory Visit: Payer: Self-pay | Admitting: *Deleted

## 2016-04-09 MED ORDER — GLUCOSE BLOOD VI STRP: ORAL_STRIP | Status: DC

## 2016-04-09 MED ORDER — ACCU-CHEK FASTCLIX LANCETS MISC: Status: DC

## 2016-04-23 ENCOUNTER — Encounter: Payer: Self-pay | Admitting: Internal Medicine

## 2016-04-23 ENCOUNTER — Ambulatory Visit (INDEPENDENT_AMBULATORY_CARE_PROVIDER_SITE_OTHER): Payer: Commercial Managed Care - HMO | Admitting: Internal Medicine

## 2016-04-23 VITALS — BP 126/84 | HR 64 | Temp 97.3°F | Resp 16 | Ht 74.0 in | Wt 201.0 lb

## 2016-04-23 DIAGNOSIS — Z23 Encounter for immunization: Secondary | ICD-10-CM

## 2016-04-23 DIAGNOSIS — I10 Essential (primary) hypertension: Secondary | ICD-10-CM

## 2016-04-23 DIAGNOSIS — E785 Hyperlipidemia, unspecified: Secondary | ICD-10-CM | POA: Diagnosis not present

## 2016-04-23 DIAGNOSIS — R7303 Prediabetes: Secondary | ICD-10-CM | POA: Diagnosis not present

## 2016-04-23 DIAGNOSIS — E559 Vitamin D deficiency, unspecified: Secondary | ICD-10-CM | POA: Diagnosis not present

## 2016-04-23 DIAGNOSIS — Z79899 Other long term (current) drug therapy: Secondary | ICD-10-CM

## 2016-04-23 DIAGNOSIS — Z125 Encounter for screening for malignant neoplasm of prostate: Secondary | ICD-10-CM

## 2016-04-23 DIAGNOSIS — Z136 Encounter for screening for cardiovascular disorders: Secondary | ICD-10-CM | POA: Diagnosis not present

## 2016-04-23 DIAGNOSIS — R6889 Other general symptoms and signs: Secondary | ICD-10-CM

## 2016-04-23 DIAGNOSIS — Z0001 Encounter for general adult medical examination with abnormal findings: Secondary | ICD-10-CM

## 2016-04-23 DIAGNOSIS — Z1212 Encounter for screening for malignant neoplasm of rectum: Secondary | ICD-10-CM

## 2016-04-23 LAB — CBC WITH DIFFERENTIAL/PLATELET
Basophils Absolute: 0 cells/uL (ref 0–200)
Basophils Relative: 0 %
Eosinophils Absolute: 94 cells/uL (ref 15–500)
Eosinophils Relative: 1 %
HCT: 48.1 % (ref 38.5–50.0)
Hemoglobin: 16.8 g/dL (ref 13.2–17.1)
Lymphocytes Relative: 18 %
Lymphs Abs: 1692 cells/uL (ref 850–3900)
MCH: 30.9 pg (ref 27.0–33.0)
MCHC: 34.9 g/dL (ref 32.0–36.0)
MCV: 88.6 fL (ref 80.0–100.0)
MPV: 10.3 fL (ref 7.5–12.5)
Monocytes Absolute: 470 cells/uL (ref 200–950)
Monocytes Relative: 5 %
Neutro Abs: 7144 cells/uL (ref 1500–7800)
Neutrophils Relative %: 76 %
Platelets: 163 10*3/uL (ref 140–400)
RBC: 5.43 MIL/uL (ref 4.20–5.80)
RDW: 14.4 % (ref 11.0–15.0)
WBC: 9.4 10*3/uL (ref 3.8–10.8)

## 2016-04-23 LAB — BASIC METABOLIC PANEL WITH GFR
BUN: 19 mg/dL (ref 7–25)
CO2: 28 mmol/L (ref 20–31)
Calcium: 9.3 mg/dL (ref 8.6–10.3)
Chloride: 104 mmol/L (ref 98–110)
Creat: 1.43 mg/dL — ABNORMAL HIGH (ref 0.70–1.18)
GFR, Est African American: 56 mL/min — ABNORMAL LOW (ref >=60)
GFR, Est Non African American: 49 mL/min — ABNORMAL LOW (ref >=60)
Glucose, Bld: 129 mg/dL — ABNORMAL HIGH (ref 65–99)
Potassium: 4.1 mmol/L (ref 3.5–5.3)
Sodium: 141 mmol/L (ref 135–146)

## 2016-04-23 LAB — LIPID PANEL
Cholesterol: 147 mg/dL (ref 125–200)
HDL: 57 mg/dL (ref >=40)
LDL Cholesterol: 76 mg/dL (ref <130)
Total CHOL/HDL Ratio: 2.6 Ratio (ref <=5.0)
Triglycerides: 68 mg/dL (ref <150)
VLDL: 14 mg/dL (ref <30)

## 2016-04-23 LAB — HEPATIC FUNCTION PANEL
ALT: 20 U/L (ref 9–46)
AST: 16 U/L (ref 10–35)
Albumin: 4.2 g/dL (ref 3.6–5.1)
Alkaline Phosphatase: 76 U/L (ref 40–115)
Bilirubin, Direct: 0.2 mg/dL (ref <=0.2)
Indirect Bilirubin: 0.7 mg/dL (ref 0.2–1.2)
Total Bilirubin: 0.9 mg/dL (ref 0.2–1.2)
Total Protein: 6.6 g/dL (ref 6.1–8.1)

## 2016-04-23 LAB — HEMOGLOBIN A1C
Hgb A1c MFr Bld: 5.6 % (ref <5.7)
Mean Plasma Glucose: 114 mg/dL

## 2016-04-23 LAB — MAGNESIUM: Magnesium: 2.1 mg/dL (ref 1.5–2.5)

## 2016-04-23 LAB — TSH: TSH: 3.14 mIU/L (ref 0.40–4.50)

## 2016-04-23 NOTE — Patient Instructions (Addendum)

## 2016-04-23 NOTE — Progress Notes (Signed)
Patient ID: Mathew Miller, male   DOB: 1946/07/15, 70 y.o.   MRN: [id number]  [facility]    Unk Pinto, M.D.    Uvaldo Bristle. Silverio Lay, P.A.-C      Starlyn Skeans, P.A.-C   [facility]                [address] [place], [place] [id number] Telephone [phone] Telefax [phone] _________________________________  Annual  Screening/Preventative Visit And Comprehensive Evaluation & Examination     This very nice 70 y.o. [name] presents for a Wellness/Preventative Visit & comprehensive evaluation and management of multiple medical co-morbidities.  Patient has been followed for HTN, Prediabetes, Hyperlipidemia and Vitamin D Deficiency.     HTN predates circa 2000 .  Patient's BP has been controlled at home.Today's  BP is 126/84. Patient denies any cardiac symptoms as chest pain, palpitations, shortness of breath, dizziness or ankle swelling.     Patient's hyperlipidemia is controlled with diet and medications. Patient denies myalgias or other medication SE's. Last lipids were at goal with  Cholesterol 145; HDL 50; LDL 78; Triglycerides 84 on 12/16/2015.     Patient has prediabetes since  2012 with A1c 6.1% and the down to 5.7% in 2014  and patient denies reactive hypoglycemic symptoms, visual blurring, diabetic polys or paresthesias. Last A1c was 5.6% on 12/16/2015.     Finally, patient has history of Vitamin D Deficiency of  "28" in 2008  and last vitamin D was 91 on 12/16/2015.    Medication Sig  . atenolol  50 MG Take 1 tablet (50 mg total) by mouth daily.  . enalapril  20 MG TAKE 1 TABLET EVERY DAY  . Magnesium 250 MG  Take 250 mg by mouth daily.  . Prostaglandin EL. 0.4 mm/20 mg Inject 0.4 mg as needed injected into the penis  . omeprazole  20 MG  TAKE 1 CAPSULE TWICE DAILY  . PAZEO 0.7 % SOLN   . MIRALAX  Take 17 g/day by mouth daily.  . pravastatin  40 MG TAKE 1 TABLET EVERY EVENING  .  promethazine  25 MG Takes 1 tablet every 4 hours as needed for nausea or vomititng.  . PSYLLIUM HUSK PO Take by mouth daily.  Marland Kitchen triamcinolone cream  0.1 % Apply 1 application topically 3  times daily. Apply sparingly 2 to 3 x day (do not use on face)   Allergies  Allergen Reactions  . Sudafed [Pseudoephedrine Hcl]     Urinary retention  . Tetanus Toxoids     Itching, burning feeling, ran a fever   Past Medical History  Diagnosis Date  . Hypertension   . Hyperlipidemia   . Vitamin D deficiency   . Prediabetes     no meds   . Constipation    Health Maintenance  Topic Date Due  . PNA vac Low Risk Adult (2 of 2 - PCV13) 10/21/2010  . Hepatitis C Screening  08/31/2023 (Originally 1946/08/05)  . TETANUS/TDAP  08/26/2027 (Originally 10/19/1975)  . INFLUENZA VACCINE  05/18/2016  . COLONOSCOPY  09/05/2019  . ZOSTAVAX  Addressed   Immunization History  Administered Date(s) Administered  . Influenza Split 08/06/2013  . Influenza, High Dose Seasonal PF 07/30/2014, 07/29/2015  . Pneumococcal Polysaccharide-23 10/21/2009  . Td 10/18/1965  . Zoster 10/19/2011   Past Surgical History  Procedure Laterality Date  . No past  surgeries    . Colonoscopy     Family History  Problem Relation Age of Onset  . Hyperlipidemia Mother   . Glaucoma Mother   . Heart disease Father   . Hypertension Father   . Colon cancer Neg Hx   . Esophageal cancer Neg Hx   . Pancreatic cancer Neg Hx   . Prostate cancer Neg Hx   . Rectal cancer Neg Hx   . Stomach cancer Neg Hx     Social History   Social History  . Marital Status: Unknown    Spouse Name: N/A  . Number of Children: N/A  . Years of Education: N/A   Occupational History  . Not on file.   Social History Main Topics  . Smoking status: Never Smoker   . Smokeless tobacco: Never Used  . Alcohol Use: 0.0 oz/week    0 Standard drinks or equivalent per week     Comment: very Rare /Beer.wine.  . Drug Use: No  . Sexual Activity: Not on file     ROS Constitutional: Denies fever, chills, weight loss/gain, headaches, insomnia,  night sweats or change in appetite. Does c/o fatigue. Eyes: Denies redness, blurred vision, diplopia, discharge, itchy or watery eyes.  ENT: Denies discharge, congestion, post nasal drip, epistaxis, sore throat, earache, hearing loss, dental pain, Tinnitus, Vertigo, Sinus pain or snoring.  Cardio: Denies chest pain, palpitations, irregular heartbeat, syncope, dyspnea, diaphoresis, orthopnea, PND, claudication or edema Respiratory: denies cough, dyspnea, DOE, pleurisy, hoarseness, laryngitis or wheezing.  Gastrointestinal: Denies dysphagia, heartburn, reflux, water brash, pain, cramps, nausea, vomiting, bloating, diarrhea, constipation, hematemesis, melena, hematochezia, jaundice or hemorrhoids Genitourinary: Denies dysuria, frequency, urgency, nocturia, hesitancy, discharge, hematuria or flank pain Musculoskeletal: Denies arthralgia, myalgia, stiffness, Jt. Swelling, pain, limp or strain/sprain. Denies Falls. Skin: Denies puritis, rash, hives, warts, acne, eczema or change in skin lesion Neuro: No weakness, tremor, incoordination, spasms, paresthesia or pain Psychiatric: Denies confusion, memory loss or sensory loss. Denies Depression. Endocrine: Denies change in weight, skin, hair change, nocturia, and paresthesia, diabetic polys, visual blurring or hyper / hypo glycemic episodes.  Heme/Lymph: No excessive bleeding, bruising or enlarged lymph nodes.  Physical Exam  BP 126/84   Pulse 64  Temp97.3 F  Resp 16  Ht 6\' 2"   Wt 201 lb    BMI 25.80   General Appearance: Well nourished, in no apparent distress. Eyes: PERRLA, EOMs, conjunctiva no swelling or erythema, normal fundi and vessels. Sinuses: No frontal/maxillary tenderness ENT/Mouth: EACs patent / TMs  nl. Nares clear without erythema, swelling, mucoid exudates. Oral hygiene is good. No erythema, swelling, or exudate. Tongue normal, non-obstructing.  Tonsils not swollen or erythematous. Hearing normal.  Neck: Supple, thyroid normal. No bruits, nodes or JVD. Respiratory: Respiratory effort normal.  BS equal and clear bilateral without rales, rhonci, wheezing or stridor. Cardio: Heart sounds are normal with regular rate and rhythm and no murmurs, rubs or gallops. Peripheral pulses are normal and equal bilaterally without edema. No aortic or femoral bruits. Chest: symmetric with normal excursions and percussion.  Abdomen: Soft, with Nl bowel sounds. Nontender, no guarding, rebound, hernias, masses, or organomegaly.  Lymphatics: Non tender without lymphadenopathy.  Genitourinary: No hernias.Testes nl. DRE - prostate nl for age - smooth & firm w/o nodules. Musculoskeletal: Full ROM all peripheral extremities, joint stability, 5/5 strength, and normal gait. Skin: Warm and dry without rashes, lesions, cyanosis, clubbing or  ecchymosis.  Neuro: Cranial nerves intact, reflexes equal bilaterally. Normal muscle tone, no cerebellar symptoms. Sensation intact.  Pysch: Alert and oriented X 3 with normal affect, insight and judgment appropriate.   Assessment and Plan  1. Annual Preventative/Screening Exam   - Microalbumin / creatinine urine ratio - EKG 12-Lead - [place], RETROPERITNL ABD,  LTD - POC Hemoccult Bld/Stl  - Urinalysis, Routine w reflex microscopic  - PSA - CBC with Differential/Platelet - BASIC METABOLIC PANEL WITH GFR - Hepatic function panel - Magnesium - Lipid panel - TSH - Hemoglobin A1c - Insulin, random - VITAMIN D 25 Hydroxy  2. Essential hypertension  - Microalbumin / creatinine urine ratio - EKG 12-Lead - [place], RETROPERITNL ABD,  LTD - TSH  3. Hyperlipidemia  - Lipid panel - TSH  4. Prediabetes  - Hemoglobin A1c - Insulin, random  5. Vitamin D deficiency  - VITAMIN D 25 Hydroxy  6. Screening for rectal cancer  - POC Hemoccult Bld/Stl  7. Prostate cancer screening  - PSA  8. Medication management  -  Urinalysis, Routine w reflex microscopic  - CBC with Differential/Platelet - BASIC METABOLIC PANEL WITH GFR - Hepatic function panel - Magnesium  9. Need for prophylactic vaccination against Streptococcus pneumoniae (pneumococcus)  - Pneumococcal conjugate vaccine 13-valent   Continue prudent diet as discussed, weight control, BP monitoring, regular exercise, and medications as discussed.  Discussed med effects and SE's. Routine screening labs and tests as requested with regular follow-up as recommended. Over 40 minutes of exam, counseling, chart review and high complex critical decision making was performed

## 2016-04-24 LAB — MICROALBUMIN / CREATININE URINE RATIO
Creatinine, Urine: 191 mg/dL (ref 20–370)
Microalb Creat Ratio: 7 mcg/mg creat (ref <30)
Microalb, Ur: 1.3 mg/dL

## 2016-04-24 LAB — URINALYSIS, ROUTINE W REFLEX MICROSCOPIC
Bilirubin Urine: NEGATIVE
Glucose, UA: NEGATIVE
Hgb urine dipstick: NEGATIVE
Leukocytes, UA: NEGATIVE
Nitrite: NEGATIVE
Protein, ur: NEGATIVE
Specific Gravity, Urine: 1.021 (ref 1.001–1.035)
pH: 6 (ref 5.0–8.0)

## 2016-04-24 LAB — INSULIN, RANDOM: Insulin: 53.3 u[IU]/mL — ABNORMAL HIGH (ref 2.0–19.6)

## 2016-04-24 LAB — VITAMIN D 25 HYDROXY (VIT D DEFICIENCY, FRACTURES): Vit D, 25-Hydroxy: 104 ng/mL — ABNORMAL HIGH (ref 30–100)

## 2016-04-24 LAB — PSA: PSA: 4.62 ng/mL — ABNORMAL HIGH (ref <=4.00)

## 2016-04-29 DIAGNOSIS — H524 Presbyopia: Secondary | ICD-10-CM | POA: Diagnosis not present

## 2016-04-29 DIAGNOSIS — H5210 Myopia, unspecified eye: Secondary | ICD-10-CM | POA: Diagnosis not present

## 2016-04-29 DIAGNOSIS — H2513 Age-related nuclear cataract, bilateral: Secondary | ICD-10-CM | POA: Diagnosis not present

## 2016-06-02 DIAGNOSIS — H2511 Age-related nuclear cataract, right eye: Secondary | ICD-10-CM | POA: Diagnosis not present

## 2016-06-11 ENCOUNTER — Other Ambulatory Visit: Payer: Self-pay | Admitting: *Deleted

## 2016-06-11 MED ORDER — PROMETHAZINE HCL 25 MG PO TABS: ORAL_TABLET | ORAL | 0 refills | Status: DC

## 2016-06-30 DIAGNOSIS — H2511 Age-related nuclear cataract, right eye: Secondary | ICD-10-CM | POA: Diagnosis not present

## 2016-07-06 DIAGNOSIS — H1859 Other hereditary corneal dystrophies: Secondary | ICD-10-CM | POA: Diagnosis not present

## 2016-07-09 ENCOUNTER — Encounter: Payer: Self-pay | Admitting: Physician Assistant

## 2016-07-09 ENCOUNTER — Ambulatory Visit (INDEPENDENT_AMBULATORY_CARE_PROVIDER_SITE_OTHER): Payer: Commercial Managed Care - HMO | Admitting: Physician Assistant

## 2016-07-09 VITALS — BP 128/74 | HR 59 | Temp 97.5°F | Resp 16 | Ht 74.0 in | Wt 200.8 lb

## 2016-07-09 DIAGNOSIS — J069 Acute upper respiratory infection, unspecified: Secondary | ICD-10-CM

## 2016-07-09 MED ORDER — AZITHROMYCIN 250 MG PO TABS: ORAL_TABLET | ORAL | 1 refills | Status: AC

## 2016-07-09 MED ORDER — PROMETHAZINE-DM 6.25-15 MG/5ML PO SYRP: 5.0000 mL | ORAL_SOLUTION | Freq: Four times a day (QID) | ORAL | 1 refills | Status: DC | PRN

## 2016-07-09 NOTE — Patient Instructions (Signed)

## 2016-07-09 NOTE — Progress Notes (Signed)
Subjective:    Patient ID: Mathew Miller, male    DOB: 1946-09-11, 70 y.o.   MRN: [id number]  HPI 70 y.o. WM presents with cough x 1 week. He is having cataract surgery Oct 5th. He is on ACE. Non productive cough. He has tried OTC cough syrup that has not helped. No SOB, CP, wheezing, fever, chills, sinus issues.   Blood pressure 128/74, pulse (!) 59, temperature 97.5 F (36.4 C), resp. rate 16, height '6\' 2"'$  (1.88 m), weight 200 lb 12.8 oz (91.1 kg), SpO2 99 %.   Medications Current Outpatient Prescriptions on File Prior to Visit  Medication Sig  . ACCU-CHEK FASTCLIX LANCETS MISC Check blood sugar 1 time daily--DX  R73.09.  Marland Kitchen Alcohol Swabs (B-D SINGLE USE SWABS REGULAR) PADS Check blood sugar 1 time daily  . atenolol (TENORMIN) 50 MG tablet Take 1 tablet (50 mg total) by mouth daily.  . Blood Glucose Monitoring Suppl (ACCU-CHEK NANO SMARTVIEW) W/DEVICE KIT Check blood sugar 1 time daily-DX R73.09  . enalapril (VASOTEC) 20 MG tablet TAKE 1 TABLET EVERY DAY  . glucose blood (ACCU-CHEK SMARTVIEW) test strip Check blood 1 time daily--DX  R73.09  . Magnesium 250 MG TABS Take 250 mg by mouth daily.  . NONFORMULARY OR COMPOUNDED ITEM Inject 0.4 mg into the skin as needed. Prostaglandin EL. 0.4 mm/20 mg as needed injected into the penis  . omeprazole (PRILOSEC) 20 MG capsule TAKE 1 CAPSULE TWICE DAILY  . PAZEO 0.7 % SOLN   . Polyethylene Glycol 3350 (MIRALAX PO) Take 17 g/day by mouth daily.  . pravastatin (PRAVACHOL) 40 MG tablet TAKE 1 TABLET EVERY EVENING  . promethazine (PHENERGAN) 25 MG tablet Takes 1 tablet every 4 hours as needed for nausea or vomititng.  . triamcinolone cream (KENALOG) 0.1 % Apply 1 application topically 3 (three) times daily. Apply sparingly 2 to 3 x day (do not use on face)   No current facility-administered medications on file prior to visit.     Problem list He has HTN (hypertension); Hyperlipidemia; Prediabetes; Vitamin D deficiency; and Medication  management on his problem list.   Review of Systems  Constitutional: Negative for chills, diaphoresis and fever.  HENT: Negative for congestion, ear pain, postnasal drip, rhinorrhea, sinus pressure, sneezing, sore throat, trouble swallowing and voice change.   Eyes: Negative.   Respiratory: Positive for cough. Negative for chest tightness, shortness of breath and wheezing.   Cardiovascular: Negative.   Gastrointestinal: Negative.   Genitourinary: Negative.   Musculoskeletal: Negative.  Negative for neck pain.  Neurological: Negative.  Negative for headaches.       Objective:   Physical Exam  Constitutional: He is oriented to person, place, and time. He appears well-developed and well-nourished.  HENT:  Head: Normocephalic and atraumatic.  Right Ear: External ear normal.  Left Ear: External ear normal.  Mouth/Throat: Oropharynx is clear and moist.  Eyes: Conjunctivae and EOM are normal. Pupils are equal, round, and reactive to light.  Neck: Normal range of motion. Neck supple.  Cardiovascular: Normal rate, regular rhythm and normal heart sounds.   Pulmonary/Chest: Effort normal and breath sounds normal.  Abdominal: Soft. Bowel sounds are normal.  Musculoskeletal: Normal range of motion.  Neurological: He is alert and oriented to person, place, and time. No cranial nerve deficit.  Skin: Skin is warm and dry.  Psychiatric: He has a normal mood and affect. His behavior is normal.       Assessment & Plan:  1. URI (upper respiratory infection)  If continuing cough will stop ACE/switch to ARB - promethazine-dextromethorphan (PROMETHAZINE-DM) 6.25-15 MG/5ML syrup; Take 5 mLs by mouth 4 (four) times daily as needed for cough.  Dispense: 240 mL; Refill: 1 - azithromycin (ZITHROMAX) 250 MG tablet; Take 2 tablets (500 mg) on  Day 1,  followed by 1 tablet (250 mg) once daily on Days 2 through 5.  Dispense: 6 each; Refill: 1

## 2016-07-21 DIAGNOSIS — H2511 Age-related nuclear cataract, right eye: Secondary | ICD-10-CM | POA: Diagnosis not present

## 2016-07-22 DIAGNOSIS — H2512 Age-related nuclear cataract, left eye: Secondary | ICD-10-CM | POA: Diagnosis not present

## 2016-07-28 DIAGNOSIS — H2512 Age-related nuclear cataract, left eye: Secondary | ICD-10-CM | POA: Diagnosis not present

## 2016-07-31 ENCOUNTER — Other Ambulatory Visit: Payer: Self-pay | Admitting: Internal Medicine

## 2016-08-05 ENCOUNTER — Ambulatory Visit: Payer: Self-pay | Admitting: Internal Medicine

## 2016-08-11 ENCOUNTER — Ambulatory Visit (INDEPENDENT_AMBULATORY_CARE_PROVIDER_SITE_OTHER): Payer: Commercial Managed Care - HMO | Admitting: *Deleted

## 2016-08-11 DIAGNOSIS — Z23 Encounter for immunization: Secondary | ICD-10-CM

## 2016-08-17 ENCOUNTER — Encounter: Payer: Self-pay | Admitting: Internal Medicine

## 2016-08-17 ENCOUNTER — Ambulatory Visit (INDEPENDENT_AMBULATORY_CARE_PROVIDER_SITE_OTHER): Payer: Commercial Managed Care - HMO | Admitting: Internal Medicine

## 2016-08-17 VITALS — BP 130/78 | HR 58 | Temp 98.0°F | Resp 16 | Ht 74.0 in | Wt 200.0 lb

## 2016-08-17 DIAGNOSIS — E782 Mixed hyperlipidemia: Secondary | ICD-10-CM | POA: Diagnosis not present

## 2016-08-17 DIAGNOSIS — K219 Gastro-esophageal reflux disease without esophagitis: Secondary | ICD-10-CM | POA: Diagnosis not present

## 2016-08-17 DIAGNOSIS — I10 Essential (primary) hypertension: Secondary | ICD-10-CM | POA: Diagnosis not present

## 2016-08-17 DIAGNOSIS — E559 Vitamin D deficiency, unspecified: Secondary | ICD-10-CM | POA: Diagnosis not present

## 2016-08-17 DIAGNOSIS — R7303 Prediabetes: Secondary | ICD-10-CM | POA: Diagnosis not present

## 2016-08-17 DIAGNOSIS — Z79899 Other long term (current) drug therapy: Secondary | ICD-10-CM

## 2016-08-17 LAB — HEPATIC FUNCTION PANEL
ALT: 19 U/L (ref 9–46)
AST: 15 U/L (ref 10–35)
Albumin: 4.3 g/dL (ref 3.6–5.1)
Alkaline Phosphatase: 57 U/L (ref 40–115)
Bilirubin, Direct: 0.2 mg/dL (ref <=0.2)
Indirect Bilirubin: 0.6 mg/dL (ref 0.2–1.2)
Total Bilirubin: 0.8 mg/dL (ref 0.2–1.2)
Total Protein: 6.5 g/dL (ref 6.1–8.1)

## 2016-08-17 LAB — CBC WITH DIFFERENTIAL/PLATELET
Basophils Absolute: 0 cells/uL (ref 0–200)
Basophils Relative: 0 %
Eosinophils Absolute: 92 cells/uL (ref 15–500)
Eosinophils Relative: 1 %
HCT: 48 % (ref 38.5–50.0)
Hemoglobin: 16.4 g/dL (ref 13.2–17.1)
Lymphocytes Relative: 19 %
Lymphs Abs: 1748 cells/uL (ref 850–3900)
MCH: 30.6 pg (ref 27.0–33.0)
MCHC: 34.2 g/dL (ref 32.0–36.0)
MCV: 89.6 fL (ref 80.0–100.0)
MPV: 10.1 fL (ref 7.5–12.5)
Monocytes Absolute: 920 cells/uL (ref 200–950)
Monocytes Relative: 10 %
Neutro Abs: 6440 cells/uL (ref 1500–7800)
Neutrophils Relative %: 70 %
Platelets: 170 10*3/uL (ref 140–400)
RBC: 5.36 MIL/uL (ref 4.20–5.80)
RDW: 14.5 % (ref 11.0–15.0)
WBC: 9.2 10*3/uL (ref 3.8–10.8)

## 2016-08-17 LAB — BASIC METABOLIC PANEL WITH GFR
BUN: 19 mg/dL (ref 7–25)
CO2: 28 mmol/L (ref 20–31)
Calcium: 9.4 mg/dL (ref 8.6–10.3)
Chloride: 104 mmol/L (ref 98–110)
Creat: 1.27 mg/dL — ABNORMAL HIGH (ref 0.70–1.18)
GFR, Est African American: 65 mL/min (ref >=60)
GFR, Est Non African American: 56 mL/min — ABNORMAL LOW (ref >=60)
Glucose, Bld: 94 mg/dL (ref 65–99)
Potassium: 4.3 mmol/L (ref 3.5–5.3)
Sodium: 141 mmol/L (ref 135–146)

## 2016-08-17 LAB — LIPID PANEL
Cholesterol: 150 mg/dL (ref 125–200)
HDL: 58 mg/dL (ref >=40)
LDL Cholesterol: 78 mg/dL (ref <130)
Total CHOL/HDL Ratio: 2.6 Ratio (ref <=5.0)
Triglycerides: 68 mg/dL (ref <150)
VLDL: 14 mg/dL (ref <30)

## 2016-08-17 LAB — TSH: TSH: 2.85 mIU/L (ref 0.40–4.50)

## 2016-08-17 LAB — HEMOGLOBIN A1C
Hgb A1c MFr Bld: 5.2 % (ref <5.7)
Mean Plasma Glucose: 103 mg/dL

## 2016-08-17 NOTE — Patient Instructions (Signed)
Please cut your Vitamin D level back to 2,000 IU daily when you finish your current bottle of Vitamin D.  It is running slightly high and we don't want to get it too high.

## 2016-08-17 NOTE — Progress Notes (Signed)
Assessment and Plan:  Hypertension:  -well controlled -cont current meds -Continue medication,  -monitor blood pressure at home.  -Continue DASH diet.   -Reminder to go to the ER if any CP, SOB, nausea, dizziness, severe HA, changes vision/speech, left arm numbness and tingling, and jaw pain.  Cholesterol: -Continue diet and exercise.  -Check cholesterol.   Pre-diabetes: -avoid checking sugars as A1C is excellent -Continue diet and exercise.  -Check A1C  Vitamin D Def: -cut back on D as is too high currently -check level -continue medications.   Continue diet and meds as discussed. Further disposition pending results of labs.  HPI 70 y.o. male  presents for 3 month follow up with hypertension, hyperlipidemia, prediabetes and vitamin D.   His blood pressure has been controlled at home, today their BP is BP: 130/78.   He does not workout. He denies chest pain, shortness of breath, dizziness.  He reports that he did not had any difficulty with his atenolol medication.  He reports that he did have some stock piled.     He is on cholesterol medication and denies myalgias. His cholesterol is at goal. The cholesterol last visit was:   Lab Results  Component Value Date   CHOL 147 04/23/2016   HDL 57 04/23/2016   LDLCALC 76 04/23/2016   TRIG 68 04/23/2016   CHOLHDL 2.6 04/23/2016     He has been working on diet and exercise for prediabetes, and denies foot ulcerations, hyperglycemia, hypoglycemia , increased appetite, nausea, paresthesia of the feet, polydipsia, polyuria, visual disturbances, vomiting and weight loss. Last A1C in the office was:  Lab Results  Component Value Date   HGBA1C 5.6 04/23/2016  He reports that he is no longer checking blood sugars.  He reports that he is never having low readings.    Patient is on Vitamin D supplement.  Lab Results  Component Value Date   VD25OH 104 (H) 04/23/2016     He reports that he had his cataracts done recently and has been  doing really well with this.  He has been happy with the outcome.     Current Medications:  Current Outpatient Prescriptions on File Prior to Visit  Medication Sig Dispense Refill  . ACCU-CHEK FASTCLIX LANCETS MISC Check blood sugar 1 time daily--DX  R73.09. 102 each 4  . Alcohol Swabs (B-D SINGLE USE SWABS REGULAR) PADS Check blood sugar 1 time daily 100 each 11  . atenolol (TENORMIN) 50 MG tablet Take 1 tablet (50 mg total) by mouth daily. 90 tablet 1  . Blood Glucose Monitoring Suppl (ACCU-CHEK NANO SMARTVIEW) W/DEVICE KIT Check blood sugar 1 time daily-DX R73.09 1 kit 0  . enalapril (VASOTEC) 20 MG tablet TAKE 1 TABLET EVERY DAY 90 tablet 1  . glucose blood (ACCU-CHEK SMARTVIEW) test strip Check blood 1 time daily--DX  R73.09 100 each 4  . Magnesium 250 MG TABS Take 250 mg by mouth daily.    . NONFORMULARY OR COMPOUNDED ITEM Inject 0.4 mg into the skin as needed. Prostaglandin EL. 0.4 mm/20 mg as needed injected into the penis    . omeprazole (PRILOSEC) 20 MG capsule TAKE 1 CAPSULE TWICE DAILY 180 capsule 1  . PAZEO 0.7 % SOLN     . Polyethylene Glycol 3350 (MIRALAX PO) Take 17 g/day by mouth daily.    . pravastatin (PRAVACHOL) 40 MG tablet TAKE 1 TABLET EVERY EVENING 90 tablet 1  . promethazine (PHENERGAN) 25 MG tablet Takes 1 tablet every 4 hours as needed   for nausea or vomititng. 100 tablet 0  . triamcinolone cream (KENALOG) 0.1 % Apply 1 application topically 3 (three) times daily. Apply sparingly 2 to 3 x day (do not use on face) 80 g 3   No current facility-administered medications on file prior to visit.     Medical History:  Past Medical History:  Diagnosis Date  . Constipation   . Hyperlipidemia   . Hypertension   . Prediabetes    no meds   . Vitamin D deficiency     Allergies:  Allergies  Allergen Reactions  . Sudafed [Pseudoephedrine Hcl]     Urinary retention  . Tetanus Toxoids     Itching, burning feeling, ran a fever     Review of Systems:  Review of  Systems  Constitutional: Negative for chills, fever and malaise/fatigue.  HENT: Negative for congestion, ear pain and sore throat.   Eyes: Negative.   Respiratory: Negative for cough, shortness of breath and wheezing.   Cardiovascular: Negative for chest pain, palpitations and leg swelling.  Gastrointestinal: Negative for abdominal pain, blood in stool, constipation, diarrhea, heartburn and melena.  Genitourinary: Negative.   Skin: Negative.   Neurological: Negative for dizziness, sensory change, loss of consciousness and headaches.  Psychiatric/Behavioral: Negative for depression. The patient is not nervous/anxious and does not have insomnia.     Family history- Review and unchanged  Social history- Review and unchanged  Physical Exam: BP 130/78   Pulse (!) 58   Temp 98 F (36.7 C) (Temporal)   Resp 16   Ht 6' 2" (1.88 m)   Wt 200 lb (90.7 kg)   BMI 25.68 kg/m  Wt Readings from Last 3 Encounters:  08/17/16 200 lb (90.7 kg)  07/09/16 200 lb 12.8 oz (91.1 kg)  04/23/16 201 lb (91.2 kg)    General Appearance: Well nourished well developed, in no apparent distress. Eyes: PERRLA, EOMs, conjunctiva no swelling or erythema ENT/Mouth: Ear canals normal without obstruction, swelling, erythma, discharge.  TMs normal bilaterally.  Oropharynx moist, clear, without exudate, or postoropharyngeal swelling. Neck: Supple, thyroid normal,no cervical adenopathy  Respiratory: Respiratory effort normal, Breath sounds clear A&P without rhonchi, wheeze, or rale.  No retractions, no accessory usage. Cardio: RRR with no MRGs. Brisk peripheral pulses without edema.  Abdomen: Soft, + BS,  Non tender, no guarding, rebound, hernias, masses. Musculoskeletal: Full ROM, 5/5 strength, Normal gait Skin: Warm, dry without rashes, lesions, ecchymosis.  Neuro: Awake and oriented X 3, Cranial nerves intact. Normal muscle tone, no cerebellar symptoms. Psych: Normal affect, Insight and Judgment appropriate.      [place]cci, PA-C 10:41 AM [facility]  

## 2016-09-02 DIAGNOSIS — Z0100 Encounter for examination of eyes and vision without abnormal findings: Secondary | ICD-10-CM | POA: Diagnosis not present

## 2016-09-30 DIAGNOSIS — H903 Sensorineural hearing loss, bilateral: Secondary | ICD-10-CM | POA: Diagnosis not present

## 2016-10-07 ENCOUNTER — Other Ambulatory Visit: Payer: Self-pay | Admitting: Internal Medicine

## 2016-10-28 ENCOUNTER — Other Ambulatory Visit: Payer: Self-pay | Admitting: Internal Medicine

## 2016-11-11 ENCOUNTER — Ambulatory Visit: Payer: Self-pay | Admitting: Internal Medicine

## 2016-11-30 ENCOUNTER — Ambulatory Visit: Payer: Self-pay | Admitting: Internal Medicine

## 2016-12-06 DIAGNOSIS — Z961 Presence of intraocular lens: Secondary | ICD-10-CM | POA: Diagnosis not present

## 2016-12-09 ENCOUNTER — Other Ambulatory Visit: Payer: Self-pay | Admitting: Internal Medicine

## 2017-01-04 DIAGNOSIS — N401 Enlarged prostate with lower urinary tract symptoms: Secondary | ICD-10-CM | POA: Diagnosis not present

## 2017-01-04 DIAGNOSIS — N5201 Erectile dysfunction due to arterial insufficiency: Secondary | ICD-10-CM | POA: Diagnosis not present

## 2017-01-04 DIAGNOSIS — R351 Nocturia: Secondary | ICD-10-CM | POA: Diagnosis not present

## 2017-01-08 ENCOUNTER — Other Ambulatory Visit: Payer: Self-pay | Admitting: Internal Medicine

## 2017-01-17 ENCOUNTER — Ambulatory Visit: Payer: Self-pay | Admitting: Internal Medicine

## 2017-01-20 ENCOUNTER — Ambulatory Visit: Payer: Self-pay | Admitting: Internal Medicine

## 2017-01-25 ENCOUNTER — Encounter: Payer: Self-pay | Admitting: Internal Medicine

## 2017-01-25 ENCOUNTER — Ambulatory Visit (INDEPENDENT_AMBULATORY_CARE_PROVIDER_SITE_OTHER): Payer: Medicare HMO | Admitting: Internal Medicine

## 2017-01-25 VITALS — BP 116/80 | HR 56 | Temp 97.9°F | Resp 16 | Ht 74.0 in | Wt 198.2 lb

## 2017-01-25 DIAGNOSIS — I10 Essential (primary) hypertension: Secondary | ICD-10-CM

## 2017-01-25 DIAGNOSIS — E782 Mixed hyperlipidemia: Secondary | ICD-10-CM | POA: Diagnosis not present

## 2017-01-25 DIAGNOSIS — E559 Vitamin D deficiency, unspecified: Secondary | ICD-10-CM

## 2017-01-25 DIAGNOSIS — Z79899 Other long term (current) drug therapy: Secondary | ICD-10-CM | POA: Diagnosis not present

## 2017-01-25 DIAGNOSIS — R7303 Prediabetes: Secondary | ICD-10-CM

## 2017-01-25 LAB — CBC WITH DIFFERENTIAL/PLATELET
Basophils Absolute: 0 cells/uL (ref 0–200)
Basophils Relative: 0 %
Eosinophils Absolute: 87 cells/uL (ref 15–500)
Eosinophils Relative: 1 %
HCT: 46.3 % (ref 38.5–50.0)
Hemoglobin: 15.5 g/dL (ref 13.2–17.1)
Lymphocytes Relative: 23 %
Lymphs Abs: 2001 cells/uL (ref 850–3900)
MCH: 29.9 pg (ref 27.0–33.0)
MCHC: 33.5 g/dL (ref 32.0–36.0)
MCV: 89.2 fL (ref 80.0–100.0)
MPV: 10 fL (ref 7.5–12.5)
Monocytes Absolute: 609 cells/uL (ref 200–950)
Monocytes Relative: 7 %
Neutro Abs: 6003 cells/uL (ref 1500–7800)
Neutrophils Relative %: 69 %
Platelets: 164 10*3/uL (ref 140–400)
RBC: 5.19 MIL/uL (ref 4.20–5.80)
RDW: 14.6 % (ref 11.0–15.0)
WBC: 8.7 10*3/uL (ref 3.8–10.8)

## 2017-01-25 NOTE — Progress Notes (Signed)
This very nice 71 y.o. [name] presents for 3 month follow up with Hypertension, Hyperlipidemia, Pre-Diabetes and Vitamin D Deficiency.      Patient is treated for HTN (2000) & BP has been controlled at home. Today's BP is at goal - 116/80. Patient has had no complaints of any cardiac type chest pain, palpitations, dyspnea/orthopnea/PND, dizziness, claudication, or dependent edema.     Hyperlipidemia is controlled with diet & meds. Patient denies myalgias or other med SE's. Last Lipids were at goal: Lab Results  Component Value Date   CHOL 150 08/17/2016   HDL 58 08/17/2016   LDLCALC 78 08/17/2016   TRIG 68 08/17/2016   CHOLHDL 2.6 08/17/2016      Also, the patient has history of PreDiabetes  (A!C 6.1% in 2012 and down to 5.7% in 2014) and has had no symptoms of reactive hypoglycemia, diabetic polys, paresthesias or visual blurring.  Last A1c was at goal: Lab Results  Component Value Date   HGBA1C 5.2 08/17/2016      Further, the patient also has history of Vitamin D Deficiency ("33" in 2008) and supplements vitamin D without any suspected side-effects. Last vitamin D was at goal: Lab Results  Component Value Date   VD25OH 104 (H) 04/23/2016   Current Outpatient Prescriptions on File Prior to Visit  Medication Sig  . atenolol  50 MG tablet TAKE 1 TAB DAILY.  Marland Kitchen enalapril  20 MG tablet TAKE 1 TABLET EVERY DAY - takes 1/2 tab  . Magnesium 250 MG TABS Take 250 mg by mouth daily.- takes 1/2 tab  . Prostaglandin EL. 0.4 mm/20 mg Inject 0.4 mg into the penis  . omeprazole  20 MG capsule TAKE 1 CAPSULE TWICE DAILY  . PAZEO 0.7 % SOLN   . MIRALAX  Take 17 g/day by mouth daily.  . pravastatin 40 MG tablet TAKE 1 TABLET EVERY EVENING - takes 1/2 tab  . promethazine  25 MG tablet Takes 1 tablet every 4 hours as needed for nausea or vomititng.  . triamcinolone crm  0.1 % Apply sparingly 2 to 3 x day (do not use on face)   Allergies  Allergen Reactions  . Sudafed [Pseudoephedrine Hcl]    Urinary retention  . Tetanus Toxoids     Itching, burning feeling, ran a fever   PMHx:   Past Medical History:  Diagnosis Date  . Constipation   . Hyperlipidemia   . Hypertension   . Prediabetes    no meds   . Vitamin D deficiency    Immunization History  Administered Date(s) Administered  . Influenza Split 08/06/2013  . Influenza, High Dose Seasonal PF 07/30/2014, 07/29/2015, 08/11/2016  . Pneumococcal Conjugate-13 04/23/2016  . Pneumococcal Polysaccharide-23 10/21/2009  . Td 10/18/1965  . Zoster 10/19/2011   Past Surgical History:  Procedure Laterality Date  . COLONOSCOPY    . NO PAST SURGERIES     FHx:    Reviewed / unchanged  SHx:    Reviewed / unchanged  Systems Review:  Constitutional: Denies fever, chills, wt changes, headaches, insomnia, fatigue, night sweats, change in appetite. Eyes: Denies redness, blurred vision, diplopia, discharge, itchy, watery eyes.  ENT: Denies discharge, congestion, post nasal drip, epistaxis, sore throat, earache, hearing loss, dental pain, tinnitus, vertigo, sinus pain, snoring.  CV: Denies chest pain, palpitations, irregular heartbeat, syncope, dyspnea, diaphoresis, orthopnea, PND, claudication or edema. Respiratory: denies cough, dyspnea, DOE, pleurisy, hoarseness, laryngitis, wheezing.  Gastrointestinal: Denies dysphagia, odynophagia, heartburn, reflux, water brash, abdominal  pain or cramps, nausea, vomiting, bloating, diarrhea, constipation, hematemesis, melena, hematochezia  or hemorrhoids. Genitourinary: Denies dysuria, frequency, urgency, nocturia, hesitancy, discharge, hematuria or flank pain. Musculoskeletal: Denies arthralgias, myalgias, stiffness, jt. swelling, pain, limping or strain/sprain.  Skin: Denies pruritus, rash, hives, warts, acne, eczema or change in skin lesion(s). Neuro: No weakness, tremor, incoordination, spasms, paresthesia or pain. Psychiatric: Denies confusion, memory loss or sensory loss. Endo: Denies  change in weight, skin or hair change.  Heme/Lymph: No excessive bleeding, bruising or enlarged lymph nodes.  Physical Exam  BP 116/80   Pulse (!) 56   Temp 97.9 F (36.6 C)   Resp 16   Ht 6\' 2"  (1.88 m)   Wt 198 lb 3.2 oz (89.9 kg)   BMI 25.45 kg/m   Appears well nourished, well groomed  and in no distress.  Eyes: PERRLA, EOMs, conjunctiva no swelling or erythema. Sinuses: No frontal/maxillary tenderness ENT/Mouth: EAC's clear, TM's nl w/o erythema, bulging. Nares clear w/o erythema, swelling, exudates. Oropharynx clear without erythema or exudates. Oral hygiene is good. Tongue normal, non obstructing. Hearing intact.  Neck: Supple. Thyroid nl. Car 2+/2+ without bruits, nodes or JVD. Chest: Respirations nl with BS clear & equal w/o rales, rhonchi, wheezing or stridor.  Cor: Heart sounds normal w/ regular rate and rhythm without sig. murmurs, gallops, clicks or rubs. Peripheral pulses normal and equal  without edema.  Abdomen: Soft & bowel sounds normal. Non-tender w/o guarding, rebound, hernias, masses or organomegaly.  Lymphatics: Unremarkable.  Musculoskeletal: Full ROM all peripheral extremities, joint stability, 5/5 strength and normal gait.  Skin: Warm, dry without exposed rashes, lesions or ecchymosis apparent.  Neuro: Cranial nerves intact, reflexes equal bilaterally. Sensory-motor testing grossly intact. Tendon reflexes grossly intact.  Pysch: Alert & oriented x 3.  Insight and judgement nl & appropriate. No ideations.  Assessment and Plan:  1. Essential hypertension  - Continue medication, monitor blood pressure at home.  - Continue DASH diet. Reminder to go to the ER if any CP,  SOB, nausea, dizziness, severe HA, changes vision/speech,  left arm numbness and tingling and jaw pain.  - CBC with Differential/Platelet - BASIC METABOLIC PANEL WITH GFR - Magnesium - TSH  2. Mixed hyperlipidemia  - Continue diet/meds, exercise,& lifestyle modifications.  - Continue  monitor periodic cholesterol/liver & renal functions   - Hepatic function panel - Lipid panel - TSH  3. Prediabetes  - Continue diet, exercise, lifestyle modifications.  - Monitor appropriate labs.  - Hemoglobin A1c - Insulin, random  4. Vitamin D deficiency  - Continue supplementation  - VITAMIN D 25 Hydroxy   5. Medication management  - CBC with Differential/Platelet - BASIC METABOLIC PANEL WITH GFR - Hepatic function panel - Magnesium - Lipid panel - TSH - Hemoglobin A1c - Insulin, random - VITAMIN D 25 Hydroxy       Discussed  regular exercise, BP monitoring, weight control to achieve/maintain BMI less than 25 and discussed med and SE's. Recommended labs to assess and monitor clinical status with further disposition pending results of labs. Over 30 minutes of exam, counseling, chart review was performed.

## 2017-01-25 NOTE — Patient Instructions (Signed)

## 2017-01-26 LAB — LIPID PANEL
Cholesterol: 138 mg/dL (ref <200)
HDL: 54 mg/dL (ref >40)
LDL Cholesterol: 71 mg/dL (ref <100)
Total CHOL/HDL Ratio: 2.6 Ratio (ref <5.0)
Triglycerides: 67 mg/dL (ref <150)
VLDL: 13 mg/dL (ref <30)

## 2017-01-26 LAB — BASIC METABOLIC PANEL WITH GFR
BUN: 17 mg/dL (ref 7–25)
CO2: 25 mmol/L (ref 20–31)
Calcium: 9 mg/dL (ref 8.6–10.3)
Chloride: 107 mmol/L (ref 98–110)
Creat: 1.31 mg/dL — ABNORMAL HIGH (ref 0.70–1.18)
GFR, Est African American: 62 mL/min (ref >=60)
GFR, Est Non African American: 54 mL/min — ABNORMAL LOW (ref >=60)
Glucose, Bld: 89 mg/dL (ref 65–99)
Potassium: 4.1 mmol/L (ref 3.5–5.3)
Sodium: 144 mmol/L (ref 135–146)

## 2017-01-26 LAB — HEPATIC FUNCTION PANEL
ALT: 16 U/L (ref 9–46)
AST: 17 U/L (ref 10–35)
Albumin: 4 g/dL (ref 3.6–5.1)
Alkaline Phosphatase: 68 U/L (ref 40–115)
Bilirubin, Direct: 0.1 mg/dL (ref <=0.2)
Indirect Bilirubin: 0.5 mg/dL (ref 0.2–1.2)
Total Bilirubin: 0.6 mg/dL (ref 0.2–1.2)
Total Protein: 6 g/dL — ABNORMAL LOW (ref 6.1–8.1)

## 2017-01-26 LAB — MAGNESIUM: Magnesium: 2.2 mg/dL (ref 1.5–2.5)

## 2017-01-26 LAB — TSH: TSH: 2.2 mIU/L (ref 0.40–4.50)

## 2017-01-26 LAB — HEMOGLOBIN A1C
Hgb A1c MFr Bld: 5.2 % (ref <5.7)
Mean Plasma Glucose: 103 mg/dL

## 2017-01-26 LAB — VITAMIN D 25 HYDROXY (VIT D DEFICIENCY, FRACTURES): Vit D, 25-Hydroxy: 75 ng/mL (ref 30–100)

## 2017-01-26 LAB — INSULIN, RANDOM: Insulin: 31.7 u[IU]/mL — ABNORMAL HIGH (ref 2.0–19.6)

## 2017-02-08 ENCOUNTER — Other Ambulatory Visit: Payer: Self-pay | Admitting: Internal Medicine

## 2017-03-30 DIAGNOSIS — H903 Sensorineural hearing loss, bilateral: Secondary | ICD-10-CM | POA: Diagnosis not present

## 2017-03-31 ENCOUNTER — Ambulatory Visit (INDEPENDENT_AMBULATORY_CARE_PROVIDER_SITE_OTHER): Payer: Medicare HMO | Admitting: Internal Medicine

## 2017-03-31 ENCOUNTER — Encounter: Payer: Self-pay | Admitting: Internal Medicine

## 2017-03-31 VITALS — BP 144/80 | HR 69 | Temp 97.5°F | Resp 16 | Ht 74.0 in | Wt 199.4 lb

## 2017-03-31 DIAGNOSIS — W57XXXA Bitten or stung by nonvenomous insect and other nonvenomous arthropods, initial encounter: Secondary | ICD-10-CM

## 2017-03-31 NOTE — Progress Notes (Signed)
     Patient present with concerns about 5-6 scatter 3-5 mm reddish flat skin lesion over the trunk that appear to be some type of insect bite, but do not appear to be infected .     Patient reassured and OK's to  Use some Triamcinolone cream that he has.     Imp - Non venomous insect bites    Plan - reassured & advised to return if lesions change.     (N/C OV)

## 2017-04-05 ENCOUNTER — Other Ambulatory Visit: Payer: Self-pay | Admitting: Internal Medicine

## 2017-04-21 DIAGNOSIS — Z961 Presence of intraocular lens: Secondary | ICD-10-CM | POA: Diagnosis not present

## 2017-05-05 ENCOUNTER — Ambulatory Visit (INDEPENDENT_AMBULATORY_CARE_PROVIDER_SITE_OTHER): Payer: Medicare HMO | Admitting: Internal Medicine

## 2017-05-05 ENCOUNTER — Encounter: Payer: Self-pay | Admitting: Internal Medicine

## 2017-05-05 VITALS — BP 178/96 | HR 52 | Temp 97.3°F | Resp 16 | Ht 74.0 in | Wt 197.0 lb

## 2017-05-05 DIAGNOSIS — D485 Neoplasm of uncertain behavior of skin: Secondary | ICD-10-CM | POA: Diagnosis not present

## 2017-05-05 NOTE — Progress Notes (Signed)
   Subjective:    Patient ID: Mathew Miller, male    DOB: 1946-05-15, 71 y.o.   MRN: [id number]  HPI  Patient presents with concern re: a  2 x 3 mm raised fleshy pink lesion of the dorsal penis.   Medication Sig  . atenolol  50 MG tablet TAKE 1 TABLET EVERY DAY  . enalapril  20 MG tablet TAKE 1 TABLET EVERY DAY  . Magnesium 250 MG TABS Take 250 mg by mouth daily.  . NONFORMULARY OR COMPOUNDED ITEM Inject 0.4 mg into the skin as needed. Prostaglandin EL. 0.4 mm/20 mg as needed injected into the penis  . omeprazole  20 MG capsule TAKE 1 CAPSULE TWICE DAILY  . PAZEO 0.7 % SOLN   . MIRALAX Take 17 g/day by mouth daily.  . pravastatin  40 MG tablet TAKE 1 TABLET EVERY EVENING  . promethazine  25 MG tablet Takes 1 tablet every 4 hours as needed for nausea or vomititng.  . triamcinolone crm  0.1 % Apply 1 application topically 3 (three) times daily. Apply sparingly 2 to 3 x day (do not use on face)   No facility-administered medications prior to visit.    Allergies  Allergen Reactions  . Hydrocodone-Acetaminophen Nausea Only  . Sudafed [Pseudoephedrine Hcl]     Urinary retention  . Tetanus Toxoids     Itching, burning feeling, ran a fever   Past Medical History:  Diagnosis Date  . Constipation   . Hyperlipidemia   . Hypertension   . Prediabetes    no meds   . Vitamin D deficiency    Review of Systems  negative    Objective:   Physical Exam  BP (!) 178/96   Pulse (!) 52   Temp (!) 97.3 F (36.3 C)   Resp 16   Ht 6\' 2"  (1.88 m)   Wt 197 lb (89.4 kg)   BMI 25.29 kg/m   HEENT - WNL. Neck - supple.  Chest - Clear equal BS. Cor - Nl HS. RRR w/o sig MGR. PP 1(+). No edema. MS- FROM w/o deformities.  Gait Nl. Neuro -  Nl w/o focal abnormalities. Skin  -  There is a  2 x 3 mm raised fleshy pink lesion of the dorsal penis. After informed consent and aseptic prep with alcohol, the area was anesthetized with 0.3 ml of Lidocaine 1%. Then the lesion was hyfrecated  For  destruction. Sterile dressing applied . Wound care discussed.    Assessment & Plan:   1. 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  Procedure (CPT 17000)

## 2017-05-23 ENCOUNTER — Ambulatory Visit (INDEPENDENT_AMBULATORY_CARE_PROVIDER_SITE_OTHER): Payer: Medicare HMO | Admitting: Internal Medicine

## 2017-05-23 ENCOUNTER — Encounter: Payer: Self-pay | Admitting: Internal Medicine

## 2017-05-23 VITALS — BP 118/80 | HR 72 | Temp 97.2°F | Resp 16 | Ht 74.0 in | Wt 198.8 lb

## 2017-05-23 DIAGNOSIS — Z136 Encounter for screening for cardiovascular disorders: Secondary | ICD-10-CM

## 2017-05-23 DIAGNOSIS — Z79899 Other long term (current) drug therapy: Secondary | ICD-10-CM | POA: Diagnosis not present

## 2017-05-23 DIAGNOSIS — E782 Mixed hyperlipidemia: Secondary | ICD-10-CM

## 2017-05-23 DIAGNOSIS — R7303 Prediabetes: Secondary | ICD-10-CM | POA: Diagnosis not present

## 2017-05-23 DIAGNOSIS — I10 Essential (primary) hypertension: Secondary | ICD-10-CM

## 2017-05-23 DIAGNOSIS — E559 Vitamin D deficiency, unspecified: Secondary | ICD-10-CM

## 2017-05-23 DIAGNOSIS — Z125 Encounter for screening for malignant neoplasm of prostate: Secondary | ICD-10-CM | POA: Diagnosis not present

## 2017-05-23 DIAGNOSIS — K219 Gastro-esophageal reflux disease without esophagitis: Secondary | ICD-10-CM

## 2017-05-23 DIAGNOSIS — Z1212 Encounter for screening for malignant neoplasm of rectum: Secondary | ICD-10-CM

## 2017-05-23 DIAGNOSIS — D485 Neoplasm of uncertain behavior of skin: Secondary | ICD-10-CM | POA: Diagnosis not present

## 2017-05-23 LAB — CBC WITH DIFFERENTIAL/PLATELET
Basophils Absolute: 0 cells/uL (ref 0–200)
Basophils Relative: 0 %
Eosinophils Absolute: 90 cells/uL (ref 15–500)
Eosinophils Relative: 1 %
HCT: 48.6 % (ref 38.5–50.0)
Hemoglobin: 16.4 g/dL (ref 13.2–17.1)
Lymphocytes Relative: 20 %
Lymphs Abs: 1800 cells/uL (ref 850–3900)
MCH: 30.6 pg (ref 27.0–33.0)
MCHC: 33.7 g/dL (ref 32.0–36.0)
MCV: 90.7 fL (ref 80.0–100.0)
MPV: 9.9 fL (ref 7.5–12.5)
Monocytes Absolute: 540 cells/uL (ref 200–950)
Monocytes Relative: 6 %
Neutro Abs: 6570 cells/uL (ref 1500–7800)
Neutrophils Relative %: 73 %
Platelets: 181 10*3/uL (ref 140–400)
RBC: 5.36 MIL/uL (ref 4.20–5.80)
RDW: 14.3 % (ref 11.0–15.0)
WBC: 9 10*3/uL (ref 3.8–10.8)

## 2017-05-23 LAB — BASIC METABOLIC PANEL WITH GFR
BUN: 18 mg/dL (ref 7–25)
CO2: 24 mmol/L (ref 20–32)
Calcium: 9.2 mg/dL (ref 8.6–10.3)
Chloride: 101 mmol/L (ref 98–110)
Creat: 1.26 mg/dL — ABNORMAL HIGH (ref 0.70–1.18)
GFR, Est African American: 65 mL/min (ref >=60)
GFR, Est Non African American: 56 mL/min — ABNORMAL LOW (ref >=60)
Glucose, Bld: 107 mg/dL — ABNORMAL HIGH (ref 65–99)
Potassium: 3.8 mmol/L (ref 3.5–5.3)
Sodium: 142 mmol/L (ref 135–146)

## 2017-05-23 LAB — LIPID PANEL
Cholesterol: 137 mg/dL (ref <200)
HDL: 55 mg/dL (ref >40)
LDL Cholesterol: 70 mg/dL (ref <100)
Total CHOL/HDL Ratio: 2.5 Ratio (ref <5.0)
Triglycerides: 58 mg/dL (ref <150)
VLDL: 12 mg/dL (ref <30)

## 2017-05-23 LAB — HEPATIC FUNCTION PANEL
ALT: 20 U/L (ref 9–46)
AST: 18 U/L (ref 10–35)
Albumin: 4.1 g/dL (ref 3.6–5.1)
Alkaline Phosphatase: 79 U/L (ref 40–115)
Bilirubin, Direct: 0.2 mg/dL (ref <=0.2)
Indirect Bilirubin: 0.8 mg/dL (ref 0.2–1.2)
Total Bilirubin: 1 mg/dL (ref 0.2–1.2)
Total Protein: 6.4 g/dL (ref 6.1–8.1)

## 2017-05-23 LAB — TSH: TSH: 3.71 mIU/L (ref 0.40–4.50)

## 2017-05-23 NOTE — Patient Instructions (Signed)

## 2017-05-23 NOTE — Progress Notes (Signed)
[facility]   Unk Pinto, M.D.      Uvaldo Bristle. Silverio Lay, P.A.-C [facility]                [address] [place], [place] [address] Telephone [phone] Telefax [phone] Comprehensive Evaluation & Examination     This very nice 71 y.o. [name] presents for a comprehensive evaluation and management of multiple medical co-morbidities.  Patient has been followed for HTN, Prediabetes, Hyperlipidemia and Vitamin D Deficiency.     HTN predates since 2000. Patient's BP has been controlled at home.  Today's BP is at goal - 118/80. Patient denies any cardiac symptoms as chest pain, palpitations, shortness of breathor ankle swelling, but he doe report occas to infrequent dizziness but has not monitored BP's.     Patient's hyperlipidemia is controlled with diet and medications. Patient denies myalgias or other medication SE's. Last lipids were at goal:  Lab Results  Component Value Date   CHOL 138 01/25/2017   HDL 54 01/25/2017   LDLCALC 71 01/25/2017   TRIG 67 01/25/2017   CHOLHDL 2.6 01/25/2017      Patient has prediabetes (A1c 6.1% in 2012) and patient denies reactive hypoglycemic symptoms, visual blurring, diabetic polys or paresthesias. Last A1c was at goal: Lab Results  Component Value Date   HGBA1C 5.2 01/25/2017       Finally, patient has history of Vitamin D Deficiency ("33" in 2008)  and last vitamin D was at goal: Lab Results  Component Value Date   VD25OH 43 01/25/2017   Current Outpatient Prescriptions on File Prior to Visit  Medication Sig  . atenolol 50 MG tablet TAKE 1 TABLET EVERY DAY  . enalapril20 MG tablet TAKE 1 TABLET EVERY DAY  . Magnesium 250 MG TABS Take 250 mg by mouth daily.  . Prostaglandin EL. 0.4 mm/20 mg Inject 0.4 mg into the skin as needed - injected into the penis  . omeprazole  20 MG capsule TAKE 1 CAPSULE TWICE DAILY  . PAZEO 0.7 % SOLN   . MIRALAX  Take 17 g/day  by mouth daily.  . pravastatin  40 MG tablet TAKE 1 TABLET EVERY EVENING  . promethazine 25 MG tablet Takes 1 tablet every 4 hours as needed for nausea or vomititng.  . triamcinolone crm 0.1 % Apply 1 application topically 3 (three) times daily.    Allergies  Allergen Reactions  . Hydrocodone-Acetaminophen Nausea Only  . Sudafed [Pseudoephedrine Hcl]     Urinary retention  . Tetanus Toxoids     Itching, burning feeling, ran a fever   Past Medical History:  Diagnosis Date  . Constipation   . Hyperlipidemia   . Hypertension   . Prediabetes    no meds   . Vitamin D deficiency    Health Maintenance  Topic Date Due  . INFLUENZA VACCINE  05/18/2017  . Hepatitis C Screening  08/31/2023 (Originally [birth year])  . TETANUS/TDAP  08/26/2027 (Originally 10/19/1977)  . COLONOSCOPY  09/05/2019  . PNA vac Low Risk Adult  Completed   Immunization History  Administered Date(s) Administered  . Influenza Split 08/06/2013  . Influenza, High Dose Seasonal PF 07/30/2014, 07/29/2015, 08/11/2016  . Pneumococcal Conjugate-13 04/23/2016  . Pneumococcal Polysaccharide-23 10/21/2009  . Td 10/18/1965  . Zoster 10/19/2011   Past Surgical History:  Procedure Laterality Date  . COLONOSCOPY    . NO  PAST SURGERIES     Family History  Problem Relation Age of Onset  . Hyperlipidemia Mother   . Glaucoma Mother   . Heart disease Father   . Hypertension Father   . Colon cancer Neg Hx   . Esophageal cancer Neg Hx   . Pancreatic cancer Neg Hx   . Prostate cancer Neg Hx   . Rectal cancer Neg Hx   . Stomach cancer Neg Hx    Social History   Social History  . Marital status: Unknown    Spouse name: N/A  . Number of children: N/A  . Years of education: N/A   Occupational History  . Retired   Social History Main Topics  . Smoking status: Never Smoker  . Smokeless tobacco: Never Used  . Alcohol use Comment: very Rare /Beer.wine.  . Drug use: No  . Sexual activity: Active     ROS Constitutional: Denies fever, chills, weight loss/gain, headaches, insomnia,  night sweats or change in appetite. Does c/o fatigue. Eyes: Denies redness, blurred vision, diplopia, discharge, itchy or watery eyes.  ENT: Denies discharge, congestion, post nasal drip, epistaxis, sore throat, earache, hearing loss, dental pain, Tinnitus, Vertigo, Sinus pain or snoring.  Cardio: Denies chest pain, palpitations, irregular heartbeat, syncope, dyspnea, diaphoresis, orthopnea, PND, claudication or edema Respiratory: denies cough, dyspnea, DOE, pleurisy, hoarseness, laryngitis or wheezing.  Gastrointestinal: Denies dysphagia, heartburn, reflux, water brash, pain, cramps, nausea, vomiting, bloating, diarrhea, constipation, hematemesis, melena, hematochezia, jaundice or hemorrhoids Genitourinary: Denies dysuria, frequency, urgency, nocturia, hesitancy, discharge, hematuria or flank pain Musculoskeletal: Denies arthralgia, myalgia, stiffness, Jt. Swelling, pain, limp or strain/sprain. Denies Falls. Skin: Denies puritis, rash, hives, warts, acne, eczema or change in skin lesion Neuro: No weakness, tremor, incoordination, spasms, paresthesia or pain Psychiatric: Denies confusion, memory loss or sensory loss. Denies Depression. Endocrine: Denies change in weight, skin, hair change, nocturia, and paresthesia, diabetic polys, visual blurring or hyper / hypo glycemic episodes.  Heme/Lymph: No excessive bleeding, bruising or enlarged lymph nodes.  Physical Exam  BP 118/80   Pulse 72   Temp (!) 97.2 F (36.2 C)   Resp 16   Ht 6\' 2"  (1.88 m)   Wt 198 lb 12.8 oz (90.2 kg)   BMI 25.52 kg/m   General Appearance: Well nourished and well groomed and in no apparent distress.  Eyes: PERRLA, EOMs, conjunctiva no swelling or erythema, normal fundi and vessels. Sinuses: No frontal/maxillary tenderness ENT/Mouth: EACs patent / TMs  nl. Nares clear without erythema, swelling, mucoid exudates. Oral hygiene is good.  No erythema, swelling, or exudate. Tongue normal, non-obstructing. Tonsils not swollen or erythematous. Hearing normal.  Neck: Supple, thyroid normal. No bruits, nodes or JVD. Respiratory: Respiratory effort normal.  BS equal and clear bilateral without rales, rhonci, wheezing or stridor. Cardio: Heart sounds are normal with regular rate and rhythm and no murmurs, rubs or gallops. Peripheral pulses are normal and equal bilaterally without edema. No aortic or femoral bruits. Chest: symmetric with normal excursions and percussion.  Abdomen: Soft, with Nl bowel sounds. Nontender, no guarding, rebound, hernias, masses, or organomegaly.  Lymphatics: Non tender without lymphadenopathy.  Genitourinary: DRE - Deferred - done recently by Dr Gaynelle Arabian Musculoskeletal: Full ROM all peripheral extremities, joint stability, 5/5 strength, and normal gait. Skin: Warm and dry without rashes, lesions, cyanosis, clubbing or  ecchymosis.  Neuro: Cranial nerves intact, reflexes equal bilaterally. Normal muscle tone, no cerebellar symptoms. Sensation intact.  Pysch: Alert and oriented X 3 with normal affect, insight and judgment appropriate.  Assessment and Plan  1. Essential hypertension - EKG 12-Lead - [place], RETROPERITNL ABD,  LTD - Urinalysis, Routine w reflex microscopic - Microalbumin / creatinine urine ratio - CBC with Differential/Platelet - BASIC METABOLIC PANEL WITH GFR - Magnesium - TSH  2. Hyperlipidemia, mixed  - EKG 12-Lead - [place], RETROPERITNL ABD,  LTD - Hepatic function panel - Lipid panel - TSH  3. Prediabetes  - EKG 12-Lead - [place], RETROPERITNL ABD,  LTD - Hemoglobin A1c - Insulin, random  4. Vitamin D Deficiency   - VITAMIN D 25 Hydroxy   5. Gastroesophageal reflux disease  6. Screening for rectal cancer  - POC Hemoccult Bld/Stl   7. Prostate cancer screening  - PSA  8. Screening for ischemic heart disease  - EKG 12-Lead  9. Screening for AAA (aortic abdominal  aneurysm)  - [place], RETROPERITNL ABD,  LTD  10. Medication management  - Urinalysis, Routine w reflex microscopic - Microalbumin / creatinine urine ratio - PSA - CBC with Differential/Platelet - BASIC METABOLIC PANEL WITH GFR - Hepatic function panel - Magnesium - Lipid panel - TSH - Hemoglobin A1c - Insulin, random - VITAMIN D 25 Hydroxy       Patient was counseled in prudent diet, weight control to achieve/maintain BMI less than 25, BP monitoring, regular exercise and medications as discussed.  Discussed med effects and SE's. Routine screening labs and tests as requested with regular follow-up as recommended. Over 40 minutes of exam, counseling, chart review and high complex critical decision making was performed

## 2017-05-24 LAB — URINALYSIS, ROUTINE W REFLEX MICROSCOPIC
Bilirubin Urine: NEGATIVE
Glucose, UA: NEGATIVE
Hgb urine dipstick: NEGATIVE
Ketones, ur: NEGATIVE
Leukocytes, UA: NEGATIVE
Nitrite: NEGATIVE
Protein, ur: NEGATIVE
Specific Gravity, Urine: 1.02 (ref 1.001–1.035)
pH: 5.5 (ref 5.0–8.0)

## 2017-05-24 LAB — MICROALBUMIN / CREATININE URINE RATIO
Creatinine, Urine: 193 mg/dL (ref 20–370)
Microalb Creat Ratio: 2 mcg/mg creat (ref <30)
Microalb, Ur: 0.4 mg/dL

## 2017-05-24 LAB — MAGNESIUM: Magnesium: 2.2 mg/dL (ref 1.5–2.5)

## 2017-05-24 LAB — INSULIN, RANDOM: Insulin: 23.6 u[IU]/mL — ABNORMAL HIGH (ref 2.0–19.6)

## 2017-05-24 LAB — HEMOGLOBIN A1C
Hgb A1c MFr Bld: 5.3 % (ref <5.7)
Mean Plasma Glucose: 105 mg/dL

## 2017-05-24 LAB — VITAMIN D 25 HYDROXY (VIT D DEFICIENCY, FRACTURES): Vit D, 25-Hydroxy: 71 ng/mL (ref 30–100)

## 2017-05-24 LAB — PSA: PSA: 4 ng/mL (ref <=4.0)

## 2017-06-08 ENCOUNTER — Other Ambulatory Visit: Payer: Self-pay | Admitting: Internal Medicine

## 2017-06-27 ENCOUNTER — Ambulatory Visit (INDEPENDENT_AMBULATORY_CARE_PROVIDER_SITE_OTHER): Payer: Medicare HMO | Admitting: Internal Medicine

## 2017-06-27 VITALS — BP 124/80 | HR 68 | Temp 97.6°F | Ht 74.0 in | Wt 198.0 lb

## 2017-06-27 DIAGNOSIS — W57XXXA Bitten or stung by nonvenomous insect and other nonvenomous arthropods, initial encounter: Secondary | ICD-10-CM

## 2017-06-27 NOTE — Progress Notes (Signed)
     Patient has a non tender firm pink 4-5 mm superficial skin nodule appears recent insect bite. No signs of cellulitis / lymphangiitis.     Recc continue using his Kenalog cream and return if changes.     Reassured.

## 2017-07-02 ENCOUNTER — Other Ambulatory Visit: Payer: Self-pay | Admitting: Internal Medicine

## 2017-07-06 ENCOUNTER — Other Ambulatory Visit: Payer: Self-pay | Admitting: Internal Medicine

## 2017-07-06 ENCOUNTER — Other Ambulatory Visit: Payer: Self-pay | Admitting: *Deleted

## 2017-07-06 MED ORDER — BD SWAB SINGLE USE REGULAR PADS: MEDICATED_PAD | 4 refills | Status: DC

## 2017-07-06 MED ORDER — ACCU-CHEK NANO SMARTVIEW W/DEVICE KIT: PACK | 0 refills | Status: DC

## 2017-07-20 ENCOUNTER — Ambulatory Visit (INDEPENDENT_AMBULATORY_CARE_PROVIDER_SITE_OTHER): Payer: Medicare HMO

## 2017-07-20 DIAGNOSIS — Z23 Encounter for immunization: Secondary | ICD-10-CM | POA: Diagnosis not present

## 2017-08-15 ENCOUNTER — Other Ambulatory Visit: Payer: Self-pay | Admitting: Physician Assistant

## 2017-08-25 ENCOUNTER — Ambulatory Visit (INDEPENDENT_AMBULATORY_CARE_PROVIDER_SITE_OTHER): Payer: Medicare HMO | Admitting: Physician Assistant

## 2017-08-25 ENCOUNTER — Encounter: Payer: Self-pay | Admitting: Physician Assistant

## 2017-08-25 VITALS — BP 130/76 | HR 64 | Temp 97.4°F | Resp 14 | Ht 74.0 in | Wt 191.0 lb

## 2017-08-25 DIAGNOSIS — R7303 Prediabetes: Secondary | ICD-10-CM | POA: Diagnosis not present

## 2017-08-25 DIAGNOSIS — E559 Vitamin D deficiency, unspecified: Secondary | ICD-10-CM | POA: Diagnosis not present

## 2017-08-25 DIAGNOSIS — Z79899 Other long term (current) drug therapy: Secondary | ICD-10-CM | POA: Diagnosis not present

## 2017-08-25 DIAGNOSIS — Z0000 Encounter for general adult medical examination without abnormal findings: Secondary | ICD-10-CM

## 2017-08-25 DIAGNOSIS — E782 Mixed hyperlipidemia: Secondary | ICD-10-CM

## 2017-08-25 DIAGNOSIS — Z0001 Encounter for general adult medical examination with abnormal findings: Secondary | ICD-10-CM | POA: Diagnosis not present

## 2017-08-25 DIAGNOSIS — R6889 Other general symptoms and signs: Secondary | ICD-10-CM | POA: Diagnosis not present

## 2017-08-25 DIAGNOSIS — I10 Essential (primary) hypertension: Secondary | ICD-10-CM

## 2017-08-25 LAB — LIPID PANEL
Cholesterol: 138 mg/dL (ref <200)
HDL: 56 mg/dL (ref >40)
LDL Cholesterol (Calc): 64 mg/dL (calc)
Non-HDL Cholesterol (Calc): 82 mg/dL (calc) (ref <130)
Total CHOL/HDL Ratio: 2.5 (calc) (ref <5.0)
Triglycerides: 96 mg/dL (ref <150)

## 2017-08-25 LAB — HEPATIC FUNCTION PANEL
AG Ratio: 1.7 (calc) (ref 1.0–2.5)
ALT: 18 U/L (ref 9–46)
AST: 16 U/L (ref 10–35)
Albumin: 3.9 g/dL (ref 3.6–5.1)
Alkaline phosphatase (APISO): 75 U/L (ref 40–115)
Bilirubin, Direct: 0.1 mg/dL (ref 0.0–0.2)
Globulin: 2.3 g/dL (calc) (ref 1.9–3.7)
Indirect Bilirubin: 0.5 mg/dL (calc) (ref 0.2–1.2)
Total Bilirubin: 0.6 mg/dL (ref 0.2–1.2)
Total Protein: 6.2 g/dL (ref 6.1–8.1)

## 2017-08-25 LAB — CBC WITH DIFFERENTIAL/PLATELET
Basophils Absolute: 36 cells/uL (ref 0–200)
Basophils Relative: 0.4 %
Eosinophils Absolute: 73 cells/uL (ref 15–500)
Eosinophils Relative: 0.8 %
HCT: 46.5 % (ref 38.5–50.0)
Hemoglobin: 15.5 g/dL (ref 13.2–17.1)
Lymphs Abs: 1383 cells/uL (ref 850–3900)
MCH: 29.6 pg (ref 27.0–33.0)
MCHC: 33.3 g/dL (ref 32.0–36.0)
MCV: 88.9 fL (ref 80.0–100.0)
MPV: 10.5 fL (ref 7.5–12.5)
Monocytes Relative: 8.4 %
Neutro Abs: 6843 cells/uL (ref 1500–7800)
Neutrophils Relative %: 75.2 %
Platelets: 178 10*3/uL (ref 140–400)
RBC: 5.23 10*6/uL (ref 4.20–5.80)
RDW: 13 % (ref 11.0–15.0)
Total Lymphocyte: 15.2 %
WBC mixed population: 764 cells/uL (ref 200–950)
WBC: 9.1 10*3/uL (ref 3.8–10.8)

## 2017-08-25 LAB — BASIC METABOLIC PANEL WITH GFR
BUN/Creatinine Ratio: 15 (calc) (ref 6–22)
BUN: 19 mg/dL (ref 7–25)
CO2: 31 mmol/L (ref 20–32)
Calcium: 9 mg/dL (ref 8.6–10.3)
Chloride: 104 mmol/L (ref 98–110)
Creat: 1.26 mg/dL — ABNORMAL HIGH (ref 0.70–1.18)
GFR, Est African American: 65 mL/min/{1.73_m2} (ref >=60)
GFR, Est Non African American: 56 mL/min/{1.73_m2} — ABNORMAL LOW (ref >=60)
Glucose, Bld: 98 mg/dL (ref 65–99)
Potassium: 4.1 mmol/L (ref 3.5–5.3)
Sodium: 142 mmol/L (ref 135–146)

## 2017-08-25 LAB — TSH: TSH: 1.77 mIU/L (ref 0.40–4.50)

## 2017-08-25 LAB — MAGNESIUM: Magnesium: 2.3 mg/dL (ref 1.5–2.5)

## 2017-08-25 NOTE — Patient Instructions (Addendum)
Take 1/2 atenolol daily Take 1/2 of enalapril daily Monitor your BP AND your heart rate, if HR is below 60 consistently call the office If your BP is great than 150/90 take the other half of the enalapril Continue to exercise regularly  If you are working in the heat make sure you are staying hydrated  Being dehydrated can also cause fatigue, dizziness, headaches, muscle aches, joint pain, and dry skin/nails so please increase your fluids.    Please monitor your blood pressure, as we get older our body can not respond to a low blood pressure as well as it did when we were younger, for this reason we want a bit higher of a blood pressure as you get older to avoid dizziness and fatigue which can lead to falls.

## 2017-08-25 NOTE — Progress Notes (Signed)
MEDICARE ANNUAL WELLNESS VISIT AND FOLLOW UP Assessment:    Essential hypertension /- continue medications, DASH diet, exercise and monitor at home. Call if greater than 130/80.  -     CBC with Differential/Platelet -     BASIC METABOLIC PANEL WITH GFR -     Hepatic function panel -     TSH  Mixed hyperlipidemia -continue medications, check lipids, decrease fatty foods, increase activity. -     Lipid panel  Prediabetes Discussed general issues about diabetes pathophysiology and management., Educational material distributed., Suggested low cholesterol diet., Encouraged aerobic exercise., Discussed foot care., Reminded to get yearly retinal exam.  Vitamin D deficiency Continue supplement  Medication management -     Magnesium    Future Appointments  Date Time Provider [place]  11/29/2017  3:30 PM Unk Pinto, MD [facility] None  06/26/2018 10:00 AM Unk Pinto, MD [facility] None     Plan:   During the course of the visit the patient was educated and counseled about appropriate screening and preventive services including:    Pneumococcal vaccine   Influenza vaccine  Td vaccine  Screening electrocardiogram  Colorectal cancer screening  Diabetes screening  Glaucoma screening  Nutrition counseling   Subjective:  Mathew Miller is a 71 y.o. male who presents for Medicare Annual Wellness Visit and 3 month follow up for HTN, hyperlipidemia, prediabetes, and vitamin D Def.   His blood pressure has been controlled at home, he is on atenolol 25 mg and enalapril 58m, dosage decreased due to dizziness, states with heat has some dizziness and with exercise he feels better, today their BP is BP: 130/76 He does workout. He denies chest pain, shortness of breath, dizziness.  He is on cholesterol medication and denies myalgias. His cholesterol is at goal. The cholesterol last visit was:   Lab Results  Component Value Date   CHOL 137 05/23/2017   HDL 55  05/23/2017   LDLCALC 70 05/23/2017   TRIG 58 05/23/2017   CHOLHDL 2.5 05/23/2017   He has been working on diet and exercise for prediabetes, and denies paresthesia of the feet, polydipsia, polyuria and visual disturbances. Last A1C in the office was:  Lab Results  Component Value Date   HGBA1C 5.3 05/23/2017   Patient is on Vitamin D supplement.   Lab Results  Component Value Date   VD25OH 71 05/23/2017     BMI is Body mass index is 24.52 kg/m., he is working on diet and exercise. Wt Readings from Last 3 Encounters:  08/25/17 191 lb (86.6 kg)  06/27/17 198 lb (89.8 kg)  05/23/17 198 lb 12.8 oz (90.2 kg)    Names of Other Physician/Practitioners you currently use: 1. [facility] here for primary care 2. [place] [facility], last visit 05/2017 3. Dr. KDeatra [facility] Patient Care Team: MUnk Pinto MD as PCP - General (Internal Medicine) KInda Castle MD as Consulting Physician (Gastroenterology) TCarolan Clines MD as Consulting Physician (Urology) MWebb Laws [place] Referring Physician (Optometry)  Medication Review: Current Outpatient Medications on File Prior to Visit  Medication Sig Dispense Refill  . ACCU-CHEK FASTCLIX LANCETS MISC CHECK BLOOD SUGAR 1 TIME DAILY 102 each 3  . ACCU-CHEK SMARTVIEW test strip CHECK BLOOD SUGAR 1 TIME DAILY 100 each 3  . Alcohol Swabs (B-D SINGLE USE SWABS REGULAR) PADS Check blood sugar 1 time daily-DX-R73.03 100 each 4  . atenolol (TENORMIN) 50 MG tablet TAKE 1 TABLET EVERY DAY 90 tablet 1  .  Blood Glucose Monitoring Suppl (ACCU-CHEK NANO SMARTVIEW) w/Device KIT CHECK BLOOD SUGAR 1 TIME DAILY-DX-R73.03 1 kit 0  . enalapril (VASOTEC) 20 MG tablet TAKE 1 TABLET EVERY DAY 90 tablet 1  . Magnesium 250 MG TABS Take 250 mg by mouth daily.    . NONFORMULARY OR COMPOUNDED ITEM Inject 0.4 mg into the skin as needed. Prostaglandin EL. 0.4 mm/20 mg as needed injected into the penis    . omeprazole  (PRILOSEC) 20 MG capsule TAKE 1 CAPSULE TWICE DAILY 180 capsule 1  . PAZEO 0.7 % SOLN     . Polyethylene Glycol 3350 (MIRALAX PO) Take 17 g/day by mouth daily.    . pravastatin (PRAVACHOL) 40 MG tablet TAKE 1 TABLET EVERY EVENING 90 tablet 1  . promethazine (PHENERGAN) 25 MG tablet Takes 1 tablet every 4 hours as needed for nausea or vomititng. 100 tablet 0  . triamcinolone cream (KENALOG) 0.1 % Apply 1 application topically 3 (three) times daily. Apply sparingly 2 to 3 x day (do not use on face) 80 g 3   No current facility-administered medications on file prior to visit.     Current Problems (verified) Patient Active Problem List   Diagnosis Date Noted  . Vitamin D deficiency 03/01/2014  . Medication management 03/01/2014  . Hyperlipidemia   . Prediabetes   . HTN (hypertension) 10/09/2013    Screening Tests Immunization History  Administered Date(s) Administered  . Influenza Split 08/06/2013  . Influenza, High Dose Seasonal PF 07/30/2014, 07/29/2015, 08/11/2016, 07/20/2017  . Pneumococcal Conjugate-13 04/23/2016  . Pneumococcal Polysaccharide-23 10/21/2009  . Td 10/18/1965  . Zoster 10/19/2011   Preventative care: Last colonoscopy: 09/04/2014 Dr. Deatra Ina  Prior vaccinations: TD or Tdap: allergy  Influenza: 2018 Pneumococcal: 2011 Prevnar 2017 Shingles/Zostavax: 2013  History reviewed: allergies, current medications, past family history, past medical history, past social history, past surgical history and problem list  Allergies Allergies  Allergen Reactions  . Hydrocodone-Acetaminophen Nausea Only  . Sudafed [Pseudoephedrine Hcl]     Urinary retention  . Tetanus Toxoids     Itching, burning feeling, ran a fever    SURGICAL HISTORY He  has a past surgical history that includes No past surgeries and Colonoscopy. FAMILY HISTORY His family history includes Glaucoma in his mother; Heart disease in his father; Hyperlipidemia in his mother; Hypertension in his  father. SOCIAL HISTORY He  reports that  has never smoked. he has never used smokeless tobacco. He reports that he drinks alcohol. He reports that he does not use drugs.  MEDICARE WELLNESS OBJECTIVES: Physical activity: Current Exercise Habits: Home exercise routine, Type of exercise: walking, Time (Minutes): 30, Frequency (Times/Week): 3, Weekly Exercise (Minutes/Week): 90, Intensity: Mild Cardiac risk factors: Cardiac Risk Factors include: advanced age (>55mn, >>32women);dyslipidemia;hypertension;male gender;sedentary lifestyle Depression/mood screen:   [facility] 08/25/2017  Decreased Interest 0  Down, Depressed, Hopeless 0  PHQ - 2 Score 0    ADLs:  In your present state of health, do you have any difficulty performing the following activities: 08/25/2017 05/23/2017  Hearing? N N  Vision? N N  Difficulty concentrating or making decisions? N N  Walking or climbing stairs? N N  Dressing or bathing? N N  Doing errands, shopping? N N  Some recent data might be hidden     Cognitive Testing  Alert? Yes  Normal Appearance?Yes  Oriented to person? Yes  Place? Yes   Time? Yes  Recall of three objects?  Yes  Can perform simple calculations? Yes  Displays appropriate judgment?Yes  Can read the correct time from a watch face?Yes  EOL planning: Does Patient Have a Medical Advance Directive?: Yes Type of Advance Directive: Healthcare Power of Attorney, Living will [place] in Chart?: No - copy requested  Objective:   Blood pressure 130/76, pulse 64, temperature (!) 97.4 F (36.3 C), resp. rate 14, height _0  (1.88 m), weight 191 lb (86.6 kg), SpO2 99 %. Body mass index is 24.52 kg/m.  General appearance: alert, no distress, WD/WN, male HEENT: normocephalic, sclerae anicteric, TMs pearly, nares patent, no discharge or erythema, pharynx normal Oral cavity: MMM, no lesions Neck: supple, no lymphadenopathy, no thyromegaly, no masses Heart:  RRR, normal S1, S2, no murmurs Lungs: CTA bilaterally, no wheezes, rhonchi, or rales Abdomen: +bs, soft, non tender, non distended, no masses, no hepatomegaly, no splenomegaly Musculoskeletal: nontender, no swelling, no obvious deformity Extremities: no edema, no cyanosis, no clubbing Pulses: 2+ symmetric, upper and lower extremities, normal cap refill Neurological: alert, oriented x 3, CN2-12 intact, strength normal upper extremities and lower extremities, sensation normal throughout, DTRs 2+ throughout, no cerebellar signs, no tremor, gait slow.  Psychiatric: flat affect, behavior normal, pleasant   Medicare Attestation I have personally reviewed: The patient's medical and social history Their use of alcohol, tobacco or illicit drugs Their current medications and supplements The patient's functional ability including ADLs,fall risks, home safety risks, cognitive, and hearing and visual impairment Diet and physical activities Evidence for depression or mood disorders  The patient's weight, height, BMI, and visual acuity have been recorded in the chart.  I have made referrals, counseling, and provided education to the patient based on review of the above and I have provided the patient with a written personalized care plan for preventive services.     Vicie Mutters, PA-C   08/25/2017

## 2017-08-26 NOTE — Progress Notes (Signed)
Pt aware of lab results & voiced understanding of those results.

## 2017-08-30 ENCOUNTER — Other Ambulatory Visit: Payer: Self-pay | Admitting: Physician Assistant

## 2017-08-30 MED ORDER — PROMETHAZINE-DM 6.25-15 MG/5ML PO SYRP: 5.0000 mL | ORAL_SOLUTION | Freq: Four times a day (QID) | ORAL | 0 refills | Status: DC | PRN

## 2017-09-01 ENCOUNTER — Other Ambulatory Visit: Payer: Self-pay | Admitting: Physician Assistant

## 2017-09-01 MED ORDER — LOSARTAN POTASSIUM 50 MG PO TABS: 50.0000 mg | ORAL_TABLET | Freq: Every day | ORAL | 1 refills | Status: DC

## 2017-09-14 ENCOUNTER — Ambulatory Visit: Payer: Medicare HMO | Admitting: Internal Medicine

## 2017-09-14 ENCOUNTER — Encounter (INDEPENDENT_AMBULATORY_CARE_PROVIDER_SITE_OTHER): Payer: Self-pay

## 2017-09-14 VITALS — BP 128/78 | HR 72 | Temp 97.3°F | Resp 16 | Ht 74.0 in | Wt 191.2 lb

## 2017-09-14 DIAGNOSIS — I10 Essential (primary) hypertension: Secondary | ICD-10-CM

## 2017-09-14 DIAGNOSIS — R52 Pain, unspecified: Secondary | ICD-10-CM | POA: Diagnosis not present

## 2017-09-14 NOTE — Progress Notes (Signed)
  Subjective:    Patient ID: [name], male    DOB: 1946-02-15, 71 y.o.   MRN: [id number]  HPI  Patient is a very nice 71 yo [name] who present with hx/o HTN and a several day hx/o vague "discomfort" of his dorsal R forearm. Denies injury or related to or affected by activity, position or ROM. Denies any limitation in use. Denies skin sensation. No HA, dizziness, CP, palpitation dyspnea or diaphoresis.  Medication Sig  . atenolol (TENORMIN) 50 MG tablet TAKE 1 TABLET EVERY DAY  . losartan (COZAAR) 50 MG tablet Take 1 tablet (50 mg total) daily by mouth.  . Magnesium 250 MG TABS Take 250 mg by mouth daily.  . NONFORMULARY OR COMPOUNDED ITEM Inject 0.4 mg into the skin as needed. Prostaglandin EL. 0.4 mm/20 mg as needed injected into the penis  . omeprazole (PRILOSEC) 20 MG capsule TAKE 1 CAPSULE TWICE DAILY  . PAZEO 0.7 % SOLN   . Polyethylene Glycol 3350 (MIRALAX PO) Take 17 g/day by mouth daily.  . pravastatin (PRAVACHOL) 40 MG tablet TAKE 1 TABLET EVERY EVENING  . promethazine (PHENERGAN) 25 MG tablet Takes 1 tablet every 4 hours as needed for nausea or vomititng.  . promethazine-dextromethorphan (PROMETHAZINE-DM) 6.25-15 MG/5ML syrup Take 5 mLs 4 (four) times daily as needed by mouth for cough.  . triamcinolone cream (KENALOG) 0.1 % Apply 1 application topically 3 (three) times daily. Apply sparingly 2 to 3 x day (do not use on face)   Review of Systems  10 point systems review negative except as above.    Objective:   Physical Exam  BP 128/78   Pulse 72   Temp (!) 97.3 F (36.3 C)   Resp 16   Ht 6\' 2"  (1.88 m)   Wt 191 lb 3.2 oz (86.7 kg)   BMI 24.55 kg/m   Skin -Clear w/o rash , cyanosis or icterus. No Distress.  HEENT - Eac's patent. TM's Nl. EOM's full. PERRLA. NasoOroPharynx clear. Neck - supple. Nl Thyroid. Carotids 2+ & No bruits, nodes, JVD Chest - Clear equal BS w/o Rales, rhonchi, wheezes. Cor - Nl HS. RRR w/o sig MGR. PP 1(+). No edema. Abd - No palpable  organomegaly, masses or tenderness. BS nl. MS- FROM w/o deformities. Muscle power, tone and bulk Nl. Gait Nl. Neuro - No obvious Cr N abnormalities. Sensory, motor and Cerebellar functions appear Nl w/o focal abnormalities.  No abnormality palpated in the area of the alleged discomfort and full ROM & sensory-motor of the RUE.   Assessment & Plan:   1. Essential hypertension  2. Vague bodily discomfort  - reassured no pathology evident (yet,) but to call /return of sx's / sn's change  Over 15 minutes of exam, counseling, chart review and decision making was performed

## 2017-10-19 DIAGNOSIS — H6123 Impacted cerumen, bilateral: Secondary | ICD-10-CM | POA: Diagnosis not present

## 2017-11-02 ENCOUNTER — Other Ambulatory Visit: Payer: Self-pay | Admitting: Internal Medicine

## 2017-11-29 ENCOUNTER — Ambulatory Visit: Payer: Self-pay | Admitting: Internal Medicine

## 2017-11-29 ENCOUNTER — Ambulatory Visit (INDEPENDENT_AMBULATORY_CARE_PROVIDER_SITE_OTHER): Payer: Medicare HMO | Admitting: Internal Medicine

## 2017-11-29 ENCOUNTER — Encounter: Payer: Self-pay | Admitting: Internal Medicine

## 2017-11-29 VITALS — BP 108/66 | HR 72 | Temp 97.3°F | Resp 18 | Ht 74.0 in | Wt 199.2 lb

## 2017-11-29 DIAGNOSIS — K219 Gastro-esophageal reflux disease without esophagitis: Secondary | ICD-10-CM

## 2017-11-29 DIAGNOSIS — J042 Acute laryngotracheitis: Secondary | ICD-10-CM

## 2017-11-29 DIAGNOSIS — E782 Mixed hyperlipidemia: Secondary | ICD-10-CM

## 2017-11-29 DIAGNOSIS — R7309 Other abnormal glucose: Secondary | ICD-10-CM | POA: Diagnosis not present

## 2017-11-29 DIAGNOSIS — Z79899 Other long term (current) drug therapy: Secondary | ICD-10-CM

## 2017-11-29 DIAGNOSIS — I10 Essential (primary) hypertension: Secondary | ICD-10-CM | POA: Diagnosis not present

## 2017-11-29 DIAGNOSIS — R7303 Prediabetes: Secondary | ICD-10-CM

## 2017-11-29 DIAGNOSIS — E559 Vitamin D deficiency, unspecified: Secondary | ICD-10-CM | POA: Diagnosis not present

## 2017-11-29 MED ORDER — PREDNISONE 20 MG PO TABS: ORAL_TABLET | ORAL | 0 refills | Status: DC

## 2017-11-29 MED ORDER — BENZONATATE 200 MG PO CAPS: ORAL_CAPSULE | ORAL | 1 refills | Status: DC

## 2017-11-29 NOTE — Progress Notes (Signed)
This very nice 72 y.o. [name] presents for 6 month follow up with Hypertension, Hyperlipidemia, GERD, Pre-Diabetes and Vitamin D Deficiency. He also c/o several day prodrome of an upper & Lower respiration infection and he has been on a Z-pak for 4 out of 5 days. Patient's GERD is controlled w/prudent diet.      Patient is treated for HTN (2000) & BP has been controlled at home. Today's BP is at goal -  108/66. Patient has had no complaints of any cardiac type chest pain, palpitations, dyspnea / orthopnea / PND, dizziness, claudication, or dependent edema.     Hyperlipidemia is controlled with diet & meds. Patient denies myalgias or other med SE's. Last Lipids were at goal: Lab Results  Component Value Date   CHOL 138 08/25/2017   HDL 56 08/25/2017   LDLCALC 70 05/23/2017   TRIG 96 08/25/2017   CHOLHDL 2.5 08/25/2017      Also, the patient has history of PreDiabetes (A1c 6.1%/2012)  and has had no symptoms of reactive hypoglycemia, diabetic polys, paresthesias or visual blurring.  Last A1c was Normal & at goal: Lab Results  Component Value Date   HGBA1C 5.3 05/23/2017      Further, the patient also has history of Vitamin D Deficiency ("33"/2008) and supplements vitamin D without any suspected side-effects. Last vitamin D was at goal: Lab Results  Component Value Date   VD25OH 46 05/23/2017   Current Outpatient Medications on File Prior to Visit  Medication Sig  . ACCU-CHEK FASTCLIX LANCETS MISC CHECK BLOOD SUGAR 1 TIME DAILY  . ACCU-CHEK SMARTVIEW test strip CHECK BLOOD SUGAR 1 TIME DAILY  . Alcohol Swabs (B-D SINGLE USE SWABS REGULAR) PADS Check blood sugar 1 time daily-DX-R73.03  . atenolol (TENORMIN) 50 MG tablet TAKE 1 TABLET EVERY DAY  . Blood Glucose Monitoring Suppl (ACCU-CHEK NANO SMARTVIEW) w/Device KIT CHECK BLOOD SUGAR 1 TIME DAILY-DX-R73.03  . losartan (COZAAR) 50 MG tablet Take 1 tablet (50 mg total) daily by mouth.  . Magnesium 250 MG TABS Take 250 mg by mouth daily.    . NONFORMULARY OR COMPOUNDED ITEM Inject 0.4 mg into the skin as needed. Prostaglandin EL. 0.4 mm/20 mg as needed injected into the penis  . omeprazole (PRILOSEC) 20 MG capsule TAKE 1 CAPSULE TWICE DAILY  . PAZEO 0.7 % SOLN   . Polyethylene Glycol 3350 (MIRALAX PO) Take 17 g/day by mouth daily.  . pravastatin (PRAVACHOL) 40 MG tablet TAKE 1 TABLET EVERY EVENING  . promethazine (PHENERGAN) 25 MG tablet Takes 1 tablet every 4 hours as needed for nausea or vomititng.  . promethazine-dextromethorphan (PROMETHAZINE-DM) 6.25-15 MG/5ML syrup Take 5 mLs 4 (four) times daily as needed by mouth for cough.  . triamcinolone cream (KENALOG) 0.1 % Apply 1 application topically 3 (three) times daily. Apply sparingly 2 to 3 x day (do not use on face)   No current facility-administered medications on file prior to visit.    Allergies  Allergen Reactions  . Hydrocodone-Acetaminophen Nausea Only  . Sudafed [Pseudoephedrine Hcl]     Urinary retention  . Tetanus Toxoids     Itching, burning feeling, ran a fever   PMHx:   Past Medical History:  Diagnosis Date  . Constipation   . Hyperlipidemia   . Hypertension   . Prediabetes    no meds   . Vitamin D deficiency    Immunization History  Administered Date(s) Administered  . Influenza Split 08/06/2013  . Influenza, High Dose Seasonal  PF 07/30/2014, 07/29/2015, 08/11/2016, 07/20/2017  . Pneumococcal Conjugate-13 04/23/2016  . Pneumococcal Polysaccharide-23 10/21/2009  . Td 10/18/1965  . Zoster 10/19/2011   Past Surgical History:  Procedure Laterality Date  . COLONOSCOPY    . NO PAST SURGERIES     FHx:    Reviewed / unchanged  SHx:    Reviewed / unchanged  Systems Review:  Constitutional: Denies fever, chills, wt changes, headaches, insomnia, fatigue, night sweats, change in appetite. Eyes: Denies redness, blurred vision, diplopia, discharge, itchy, watery eyes.  ENT: Denies discharge, congestion, post nasal drip, epistaxis, sore throat,  earache, hearing loss, dental pain, tinnitus, vertigo, sinus pain, snoring.  CV: Denies chest pain, palpitations, irregular heartbeat, syncope, dyspnea, diaphoresis, orthopnea, PND, claudication or edema. Respiratory: denies cough, dyspnea, DOE, pleurisy, hoarseness, laryngitis, wheezing.  Gastrointestinal: Denies dysphagia, odynophagia, heartburn, reflux, water brash, abdominal pain or cramps, nausea, vomiting, bloating, diarrhea, constipation, hematemesis, melena, hematochezia  or hemorrhoids. Genitourinary: Denies dysuria, frequency, urgency, nocturia, hesitancy, discharge, hematuria or flank pain. Musculoskeletal: Denies arthralgias, myalgias, stiffness, jt. swelling, pain, limping or strain/sprain.  Skin: Denies pruritus, rash, hives, warts, acne, eczema or change in skin lesion(s). Neuro: No weakness, tremor, incoordination, spasms, paresthesia or pain. Psychiatric: Denies confusion, memory loss or sensory loss. Endo: Denies change in weight, skin or hair change.  Heme/Lymph: No excessive bleeding, bruising or enlarged lymph nodes.  Physical Exam  BP 108/66   Pulse 72   Temp (!) 97.3 F (36.3 C)   Resp 18   Ht _0  (1.88 m)   Wt 199 lb 3.2 oz (90.4 kg)   BMI 25.58 kg/m   Postural sitting BP 107/71   P 63   And   Standing  BP 84/52  P  still 63.   Appears well nourished, well groomed  and in no distress. Has hoarse raspy voice & dry cough.  Eyes: PERRLA, EOMs, conjunctiva no swelling or erythema. Sinuses: (+)  frontal/maxillary tenderness ENT/Mouth: EAC's clear, TM's nl w/o erythema, bulging. Nares clear w/o erythema, swelling, exudates. Oropharynx clear without erythema or exudates. Oral hygiene is good. Tongue normal, non obstructing. Hearing intact.  Neck: Supple. Thyroid not palpable. Car 2+/2+ without bruits, nodes or JVD. Chest: Respirations nl with BS clear & equal w/scattered  Rales & rhonchi, but no wheezing or stridor.  Cor: Heart sounds normal w/ regular rate and  rhythm without sig. murmurs, gallops, clicks or rubs. Peripheral pulses normal and equal  without edema.  Abdomen: Soft & bowel sounds normal. Non-tender w/o guarding, rebound, hernias, masses or organomegaly.  Lymphatics: Unremarkable.  Musculoskeletal: Full ROM all peripheral extremities, joint stability, 5/5 strength and normal gait.  Skin: Warm, dry without exposed rashes, lesions or ecchymosis apparent.  Neuro: Cranial nerves intact, reflexes equal bilaterally. Sensory-motor testing grossly intact. Tendon reflexes grossly intact.  Pysch: Alert & oriented x 3.  Insight and judgement nl & appropriate. No ideations.  Assessment and Plan:  1. Essential hypertension  -  Advisee continue his Atenolol at 1/2 tab qa and to stop his Losartan at nite  and monitor BPs 2 x/day standing , and encouraged , monitor blood pressure at home.  -  Reminder to go to the ER if any CP,  SOB, nausea, dizziness, severe HA, changes vision/speech.  - CBC with Differential/Platelet - BASIC METABOLIC PANEL WITH GFR - Magnesium - TSHaaaaaaaaaa  2. Hyperlipidemia, mixed  - Continue diet/meds, exercise,& lifestyle modifications.  - Continue monitor periodic cholesterol/liver & renal functions   - Hepatic function panel - Lipid  panel - TSH  3. Prediabetes  - Continue diet, exercise, lifestyle modifications.  - Monitor appropriate labs.  - Hemoglobin A1c - Insulin, random  4. Vitamin D deficiency  - Continue supplementation.  - VITAMIN D 25 Hydroxy   5. Abnormal glucose  - Hemoglobin A1c - Insulin, random  6. Gastroesophageal reflux disease   - CBC with Differential/Platelet  7. Laryngotracheitis  - predniSONE (DELTASONE) 20 MG tablet; 1 tab 3 x day for 2 days, then 1 tab 2 x day for 2 days, then 1 tab 1 x day for 3 days  Dispense: 13 tablet; Refill: 0 - benzonatate (TESSALON) 200 MG capsule; Take 1 perle 3 x / day to prevent cough  Dispense: 30 capsule; Refill: 1  8. Medication  management  - CBC with Differential/Platelet - BASIC METABOLIC PANEL WITH GFR - Hepatic function panel - Magnesium - Lipid panel - TSH - Hemoglobin A1c - Insulin, random - VITAMIN D 25 Hydroxy         Discussed  regular exercise, BP monitoring, weight control to achieve/maintain BMI less than 25 and discussed med and SE's. Recommended labs to assess and monitor clinical status with further disposition pending results of labs. Over 30 minutes of exam, counseling, chart review was performed.

## 2017-11-29 NOTE — Patient Instructions (Addendum)

## 2017-11-30 LAB — LIPID PANEL
Cholesterol: 136 mg/dL (ref <200)
HDL: 52 mg/dL (ref >40)
LDL Cholesterol (Calc): 68 mg/dL (calc)
Non-HDL Cholesterol (Calc): 84 mg/dL (calc) (ref <130)
Total CHOL/HDL Ratio: 2.6 (calc) (ref <5.0)
Triglycerides: 76 mg/dL (ref <150)

## 2017-11-30 LAB — HEPATIC FUNCTION PANEL
AG Ratio: 1.7 (calc) (ref 1.0–2.5)
ALT: 12 U/L (ref 9–46)
AST: 14 U/L (ref 10–35)
Albumin: 3.8 g/dL (ref 3.6–5.1)
Alkaline phosphatase (APISO): 83 U/L (ref 40–115)
Bilirubin, Direct: 0.2 mg/dL (ref 0.0–0.2)
Globulin: 2.2 g/dL (calc) (ref 1.9–3.7)
Indirect Bilirubin: 0.4 mg/dL (calc) (ref 0.2–1.2)
Total Bilirubin: 0.6 mg/dL (ref 0.2–1.2)
Total Protein: 6 g/dL — ABNORMAL LOW (ref 6.1–8.1)

## 2017-11-30 LAB — CBC WITH DIFFERENTIAL/PLATELET
Basophils Absolute: 41 cells/uL (ref 0–200)
Basophils Relative: 0.5 %
Eosinophils Absolute: 287 cells/uL (ref 15–500)
Eosinophils Relative: 3.5 %
HCT: 45 % (ref 38.5–50.0)
Hemoglobin: 15.3 g/dL (ref 13.2–17.1)
Lymphs Abs: 1041 cells/uL (ref 850–3900)
MCH: 30.3 pg (ref 27.0–33.0)
MCHC: 34 g/dL (ref 32.0–36.0)
MCV: 89.1 fL (ref 80.0–100.0)
MPV: 10.6 fL (ref 7.5–12.5)
Monocytes Relative: 9.4 %
Neutro Abs: 6060 cells/uL (ref 1500–7800)
Neutrophils Relative %: 73.9 %
Platelets: 165 10*3/uL (ref 140–400)
RBC: 5.05 10*6/uL (ref 4.20–5.80)
RDW: 12.9 % (ref 11.0–15.0)
Total Lymphocyte: 12.7 %
WBC mixed population: 771 cells/uL (ref 200–950)
WBC: 8.2 10*3/uL (ref 3.8–10.8)

## 2017-11-30 LAB — INSULIN, RANDOM: Insulin: 23.2 u[IU]/mL — ABNORMAL HIGH (ref 2.0–19.6)

## 2017-11-30 LAB — BASIC METABOLIC PANEL WITH GFR
BUN/Creatinine Ratio: 15 (calc) (ref 6–22)
BUN: 20 mg/dL (ref 7–25)
CO2: 29 mmol/L (ref 20–32)
Calcium: 9.2 mg/dL (ref 8.6–10.3)
Chloride: 105 mmol/L (ref 98–110)
Creat: 1.36 mg/dL — ABNORMAL HIGH (ref 0.70–1.18)
GFR, Est African American: 59 mL/min/{1.73_m2} — ABNORMAL LOW (ref >=60)
GFR, Est Non African American: 51 mL/min/{1.73_m2} — ABNORMAL LOW (ref >=60)
Glucose, Bld: 100 mg/dL — ABNORMAL HIGH (ref 65–99)
Potassium: 3.8 mmol/L (ref 3.5–5.3)
Sodium: 141 mmol/L (ref 135–146)

## 2017-11-30 LAB — HEMOGLOBIN A1C
Hgb A1c MFr Bld: 5.3 % of total Hgb (ref <5.7)
Mean Plasma Glucose: 105 (calc)
eAG (mmol/L): 5.8 (calc)

## 2017-11-30 LAB — VITAMIN D 25 HYDROXY (VIT D DEFICIENCY, FRACTURES): Vit D, 25-Hydroxy: 68 ng/mL (ref 30–100)

## 2017-11-30 LAB — TSH: TSH: 4.83 mIU/L — ABNORMAL HIGH (ref 0.40–4.50)

## 2017-11-30 LAB — MAGNESIUM: Magnesium: 2.2 mg/dL (ref 1.5–2.5)

## 2017-12-01 ENCOUNTER — Other Ambulatory Visit: Payer: Self-pay | Admitting: Internal Medicine

## 2017-12-01 ENCOUNTER — Telehealth: Payer: Self-pay | Admitting: *Deleted

## 2017-12-01 MED ORDER — LEVOFLOXACIN 500 MG PO TABS: ORAL_TABLET | ORAL | 0 refills | Status: DC

## 2017-12-01 NOTE — Telephone Encounter (Signed)
The spouse called and reported the patient is unusually tired, dizzy and unsteady on his feet.  He has finished his Z-pak and continues to have a cough. Per Dr Melford Aase, he should increase his fluids and an RX for Levaquin has been sent to the patient's pharmacy. The spouse will call back PRN.

## 2017-12-12 ENCOUNTER — Other Ambulatory Visit: Payer: Self-pay | Admitting: Internal Medicine

## 2017-12-12 MED ORDER — LEVOFLOXACIN 500 MG PO TABS: ORAL_TABLET | ORAL | 1 refills | Status: DC

## 2017-12-14 ENCOUNTER — Ambulatory Visit (INDEPENDENT_AMBULATORY_CARE_PROVIDER_SITE_OTHER): Payer: Medicare HMO | Admitting: Internal Medicine

## 2017-12-14 VITALS — BP 142/92 | HR 64 | Temp 97.9°F | Resp 16 | Ht 74.0 in | Wt 192.2 lb

## 2017-12-14 DIAGNOSIS — R4189 Other symptoms and signs involving cognitive functions and awareness: Secondary | ICD-10-CM

## 2017-12-14 DIAGNOSIS — R55 Syncope and collapse: Secondary | ICD-10-CM | POA: Diagnosis not present

## 2017-12-14 DIAGNOSIS — Z79899 Other long term (current) drug therapy: Secondary | ICD-10-CM

## 2017-12-14 LAB — CBC WITH DIFFERENTIAL/PLATELET
Basophils Absolute: 59 cells/uL (ref 0–200)
Basophils Relative: 0.5 %
Eosinophils Absolute: 637 cells/uL — ABNORMAL HIGH (ref 15–500)
Eosinophils Relative: 5.4 %
HCT: 43.5 % (ref 38.5–50.0)
Hemoglobin: 14.9 g/dL (ref 13.2–17.1)
Lymphs Abs: 838 cells/uL — ABNORMAL LOW (ref 850–3900)
MCH: 30 pg (ref 27.0–33.0)
MCHC: 34.3 g/dL (ref 32.0–36.0)
MCV: 87.5 fL (ref 80.0–100.0)
MPV: 10.5 fL (ref 7.5–12.5)
Monocytes Relative: 7.6 %
Neutro Abs: 9369 cells/uL — ABNORMAL HIGH (ref 1500–7800)
Neutrophils Relative %: 79.4 %
Platelets: 188 10*3/uL (ref 140–400)
RBC: 4.97 10*6/uL (ref 4.20–5.80)
RDW: 13.1 % (ref 11.0–15.0)
Total Lymphocyte: 7.1 %
WBC mixed population: 897 cells/uL (ref 200–950)
WBC: 11.8 10*3/uL — ABNORMAL HIGH (ref 3.8–10.8)

## 2017-12-14 LAB — BASIC METABOLIC PANEL WITH GFR
BUN/Creatinine Ratio: 17 (calc) (ref 6–22)
BUN: 20 mg/dL (ref 7–25)
CO2: 29 mmol/L (ref 20–32)
Calcium: 9.1 mg/dL (ref 8.6–10.3)
Chloride: 105 mmol/L (ref 98–110)
Creat: 1.21 mg/dL — ABNORMAL HIGH (ref 0.70–1.18)
GFR, Est African American: 68 mL/min/{1.73_m2} (ref >=60)
GFR, Est Non African American: 59 mL/min/{1.73_m2} — ABNORMAL LOW (ref >=60)
Glucose, Bld: 95 mg/dL (ref 65–99)
Potassium: 4.1 mmol/L (ref 3.5–5.3)
Sodium: 142 mmol/L (ref 135–146)

## 2017-12-14 NOTE — Patient Instructions (Signed)
Near-Syncope Near-syncope is when you suddenly become weak or dizzy, or you feel like you might pass out (faint). During an episode of near-syncope, you may:  Feel dizzy or light-headed.  Feel nauseous.  See all white or all black in your field of vision.  Have cold, clammy skin.  This condition is caused by a sudden decrease in blood flow to the brain. This decrease can result from various causes, but most of those causes are not dangerous. However, near-syncope can be a sign of a serious medical problem, so it is important to seek medical care. If you fainted, get medical help right away.Call your local emergency services (911 in the [place] Do not drive yourself to the hospital. Follow these instructions at home: Pay attention to any changes in your symptoms. Take these actions to help with your condition:  Have someone stay with you until you feel stable.  Do not drive, use machinery, or play sports until your health care provider says it is okay.  Keep all follow-up visits as told by your health care provider. This is important.  If you start to feel like you might faint, lie down right away and raise (elevate) your feet above the level of your heart. Breathe deeply and steadily. Wait until all of the symptoms have passed.  Drink enough fluid to keep your urine clear or pale yellow.  If you are taking blood pressure or heart medicine, get up slowly and take several minutes to sit and then stand. This can reduce dizziness.  Take over-the-counter and prescription medicines only as told by your health care provider.  Get help right away if:  You have a severe headache.  You have unusual pain in your chest, abdomen, or back.  You are bleeding from your mouth or rectum, or you have black or tarry stool.  You have a very fast or irregular heartbeat (palpitations).  You faint once or repeatedly.  You have a seizure.  You are confused.  You have trouble walking.  You have  severe weakness.  You have vision problems. These symptoms may represent a serious problem that is an emergency. Do not wait to see if your symptoms will go away. Get medical help right away. Call your local emergency services (911 in the [place] Do not drive yourself to the hospital. ++++++++++++++++++++++++++++++   Syncope Syncope is when you temporarily lose consciousness. Syncope may also be called fainting or passing out. It is caused by a sudden decrease in blood flow to the brain. Even though most causes of syncope are not dangerous, syncope can be a sign of a serious medical problem. Signs that you may be about to faint include:  Feeling dizzy or light-headed.  Feeling nauseous.  Seeing all white or all black in your field of vision.  Having cold, clammy skin.  If you fainted, get medical help right away.Call your local emergency services (911 in the [place] Do not drive yourself to the hospital. Follow these instructions at home: Pay attention to any changes in your symptoms. Take these actions to help with your condition:  Have someone stay with you until you feel stable.  Do not drive, use machinery, or play sports until your health care provider says it is okay.  Keep all follow-up visits as told by your health care provider. This is important.  If you start to feel like you might faint, lie down right away and raise (elevate) your feet above the level of your heart. Breathe deeply  and steadily. Wait until all of the symptoms have passed.  Drink enough fluid to keep your urine clear or pale yellow.  If you are taking blood pressure or heart medicine, get up slowly and take several minutes to sit and then stand. This can reduce dizziness.  Take over-the-counter and prescription medicines only as told by your health care provider.  Get help right away if:  You have a severe headache.  You have unusual pain in your chest, abdomen, or back.  You are bleeding from your  mouth or rectum, or you have black or tarry stool.  You have a very fast or irregular heartbeat (palpitations).  You have pain with breathing.  You faint once or repeatedly.  You have a seizure.  You are confused.  You have trouble walking.  You have severe weakness.  You have vision problems. These symptoms may represent a serious problem that is an emergency. Do not wait to see if your symptoms will go away. Get medical help right away. Call your local emergency services (911 in the [place] Do not drive yourself to the hospital. +++++++++++++++++++++++++++++++++  Mild Neurocognitive Disorder Mild neurocognitive disorder (formerly known as mild cognitive impairment) is a mental disorder. It is a slight abnormal decrease in mental function. The areas of mental function affected may include memory, thought, communication, behavior, and completion of tasks. The decrease is noticeable and measurable but for the most part does not interfere with your daily activities. Mild neurocognitive disorder typically occurs in people older than 60 years but can occur earlier. It is not as serious as major neurocognitive disorder (formerly known as dementia) but may lead to a more serious neurocognitive disorder. However, in some cases the condition does not get worse. A few people with this disorder even improve. What are the causes? There are a number of different causes of mild neurocognitive disorder:  Brain disorders associated with abnormal protein deposits, such as Alzheimer's disease, Pick's disease, and Lewy body disease.  Brain disorders associated with abnormal movement, such as Parkinson's disease and Huntington's disease.  Diseases affecting blood vessels in the brain and resulting in mini-strokes.  Certain infections, such as human immunodeficiency virus (HIV) infection.  Traumatic brain injury.  Other medical conditions such as brain tumors, underactive thyroid (hypothyroidism), and  vitamin B12 deficiency.  Use of certain prescription medicine and "recreational" drugs.  What are the signs or symptoms? Symptoms of mild neurocognitive disorder include:  Difficulty remembering. You may forget details of recent events, names, or phone numbers. You may forget important social events and appointments or repeatedly forget where you put your car keys.  Difficulty thinking and solving problems. You may have trouble with complex tasks such as paying bills or driving in unfamiliar locations.  Difficulty communicating. You may have trouble finding the right word, naming an object, forming a sentence that makes sense, or understanding what you read or hear.  Changes in your behavior or personality. You may lose interest in the things that you used to enjoy or withdraw from social situations. You may get angry more easily than usual. You may act before thinking. You may do things in public that you would not usually do. You may hear or see things that are not real (hallucinations). You may believe falsely that others are trying to hurt you (paranoia).  How is this diagnosed? Mild neurocognitive disorder is diagnosed through an assessment by your health care provider. Your health care provider will ask you and your family, friends, or  coworkers questions about your symptoms. He or she will ask how often the symptoms occur, how long they have been occurring, whether they are getting worse, and the effect they are having on your life. Your health care provider may refer you to a neurologist or mental health specialist for a detailed evaluation of your mental functions (neuropsychological testing). To identify the cause of your mild neurocognitive disorder, your health care provider may:  Obtain a detailed medical history.  Ask about alcohol and drug use, including prescription medicine.  Perform a physical exam.  Order blood tests and brain imaging exams.  How is this treated? Mild  neurocognitive disorder caused by infections, use of certain medicines or "recreational" drugs, and certain medical conditions may improve with treatment of the condition that is causing the disorder. Mild neurocognitive disorder resulting from other causes generally does not improve and may worsen. In these cases, the goal of treatment is to slow progression of the disorder and help you cope with the loss of mental function. Treatments in these cases include:  Medicine. Medicine helps mainly with memory loss and behavioral symptoms.  Talk therapy. Talk therapy provides education, emotional support, memory aids, and other ways of making up for decreases in mental function.  Lifestyle changes. These include regular exercise, a healthy diet (including essential omega-3 fatty acids), intellectual stimulation, and increased social interaction.  +++++++++++++++++++++++++++++++++  Cognitive Tips  Keep a journal/notebook with sections for the following (or use sections separately as needed):  Calendar and appointment sheet, schedule for each day, lists of reminders (such as grocery lists or "to do" list), homework assignments for therapy, important information such as family and friends names / addresses / phone numbers, medications, medical history and doctors name / phone numbers.  Avoid / remove clutter and unnecessary items from areas such as countertops / cabinets in kitchen and bathroom, closets, etc.  Organize items by purpose.  Baskets and bins help with this.  Leave notes for reminders above task to be completed.  For example: Note to turn off stove over the the stove; note to lock door beside the door, not to brush teeth then wash face by sink, note to take medication on table etc.  To help recall names of people of people or things, mentally or verbally go through the alphabet to try to determine the 1st letter of the word as this may trigger the name or word you are looking for.  If this is  too difficult and someone else knows the word, have them give you the first letter by asking, "does it start with an "A", "B", "C" etc. (or have them give you the first sound of the word or some other clue).  Review family events, occasions, names, etc.  Pictures are a good way to trigger memory.  Have others correct you if you answer something incorrectly.  Have them speak slowly with a few words to give you time to process and respond.  Don't let others automatically problem-solve. (For example: don't let them automatically lay out clothes in the correct position, but hand it to you folded so that you can figure it out for yourself.)  However, if you need help with tasks, they should give you as little as they can so that you can be successful.  If appropriate and safe, they may allow you to make mistakes so that you can figure out how to correct the error.  (For example, they may allow you to put your shoes on the  wrong foot to see if you notice that is wrong).  If you struggle, they should give you a cue.  (Example: "Do your shoes feel right?"  "Do they look right?")

## 2017-12-18 ENCOUNTER — Encounter: Payer: Self-pay | Admitting: Internal Medicine

## 2017-12-18 NOTE — Progress Notes (Signed)
Subjective:    Patient ID: Mathew Phi., male    DOB: Mar 03, 1946, 72 y.o.   MRN: [id number]  HPI  Patient had recent fall 2 days ago w/o head injury or LOC, but does report "light headed " when stands up. He report generalized feeling of weakness. Wife report his short term memory has progressively been declining.  Has had recent head & chest congestion.  Medication Sig  . atenolol (TENORMIN) 50 MG tablet TAKE 1 TABLET EVERY DAY  . benzonatate (TESSALON) 200 MG capsule Take 1 perle 3 x / day to prevent cough  . levofloxacin (LEVAQUIN) 500 MG tablet Take 1 tablet daily with food for infection  . Magnesium 250 MG TABS Take 250 mg by mouth daily.  . NONFORMULARY OR COMPOUNDED ITEM Inject 0.4 mg into the skin as needed. Prostaglandin EL. 0.4 mm/20 mg as needed injected into the penis  . omeprazole (PRILOSEC) 20 MG capsule TAKE 1 CAPSULE TWICE DAILY  . PAZEO 0.7 % SOLN   . Polyethylene Glycol 3350 (MIRALAX PO) Take 17 g/day by mouth daily.  . pravastatin (PRAVACHOL) 40 MG tablet TAKE 1 TABLET EVERY EVENING  . promethazine (PHENERGAN) 25 MG tablet Takes 1 tablet every 4 hours as needed for nausea or vomititng.  . promethazine-dextromethorphan (PROMETHAZINE-DM) 6.25-15 MG/5ML syrup Take 5 mLs 4 (four) times daily as needed by mouth for cough.  . triamcinolone cream (KENALOG) 0.1 % Apply 1 application topically 3 (three) times daily. Apply sparingly 2 to 3 x day (do not use on face)  . losartan (COZAAR) 50 MG tablet Take 1 tablet (50 mg total) daily by mouth.  . predniSONE (DELTASONE) 20 MG tablet 1 tab 3 x day for 2 days, then 1 tab 2 x day for 2 days, then 1 tab 1 x day for 3 days   Allergies  Allergen Reactions  . Hydrocodone-Acetaminophen Nausea Only  . Sudafed [Pseudoephedrine Hcl]     Urinary retention  . Tetanus Toxoids     Itching, burning feeling, ran a fever   Past Medical History:  Diagnosis Date  . Constipation   . Hyperlipidemia   . Hypertension   . Prediabetes    no  meds   . Vitamin D deficiency    Past Surgical History:  Procedure Laterality Date  . COLONOSCOPY    . NO PAST SURGERIES     Review of Systems  10 point systems review negative except as above.    Objective:   Physical Exam  BP (!) 142/92   Pulse 64   Temp 97.9 F (36.6 C)   Resp 16   Ht 6\' 2"  (1.88 m)   Wt 192 lb 3.2 oz (87.2 kg)   BMI 24.68 kg/m    Postural         Sitting BP 178/100  P 68                  And                  Standing BP 143/95   P 72   HEENT - Eac's patent. TM's Nl. EOM's full. PERRLA. NasoOroPharynx clear. Neck - supple. Nl Thyroid. Carotids 2+ & No bruits, nodes, JVD Chest - Clear equal BS w/o rales, rhonchi, wheezes. Cor - Nl HS. RRR w/o sig MGR. PP 1(+). No edema. Abd - No palpable organomegaly, masses or tenderness. BS nl. MS- FROM w/o deformities. Muscle power, tone and bulk Nl. Gait Nl. Neuro - No obvious  Cr N abnormalities. Sensory, motor and Cerebellar functions appear Nl w/o focal abnormalities.  Non focal exam w/dulled cognition.  Psyche - Mental status normal & appropriate.  No delusions, ideations or obvious mood abnormalities. Skin - exposed clear w/o rash cyanosis or icterus.    Assessment & Plan:   1. Syncope / presyncope   - CBC with Differential/Platelet - BASIC METABOLIC PANEL WITH GFR  2. Cognitive change  - Patient  repeatedly declined referral to Neurology despite his wife & my recommendations  3. Medication management  - CBC with Differential/Platelet - BASIC METABOLIC PANEL WITH GFR  4. Bronchitis   - Levaquin  500 mg # 15 x 1 rf - take 1 tablet daily with food

## 2018-01-02 ENCOUNTER — Other Ambulatory Visit: Payer: Self-pay | Admitting: Physician Assistant

## 2018-01-05 ENCOUNTER — Ambulatory Visit: Payer: Self-pay | Admitting: Internal Medicine

## 2018-01-05 ENCOUNTER — Other Ambulatory Visit: Payer: Self-pay | Admitting: Adult Health

## 2018-01-05 ENCOUNTER — Encounter: Payer: Self-pay | Admitting: Adult Health

## 2018-01-05 ENCOUNTER — Ambulatory Visit (INDEPENDENT_AMBULATORY_CARE_PROVIDER_SITE_OTHER): Payer: Medicare HMO | Admitting: Adult Health

## 2018-01-05 ENCOUNTER — Encounter: Payer: Self-pay | Admitting: Neurology

## 2018-01-05 VITALS — BP 102/68 | HR 58 | Temp 97.3°F | Ht 74.0 in | Wt 189.0 lb

## 2018-01-05 DIAGNOSIS — R413 Other amnesia: Secondary | ICD-10-CM

## 2018-01-05 DIAGNOSIS — R262 Difficulty in walking, not elsewhere classified: Secondary | ICD-10-CM

## 2018-01-05 DIAGNOSIS — R42 Dizziness and giddiness: Secondary | ICD-10-CM

## 2018-01-05 DIAGNOSIS — I10 Essential (primary) hypertension: Secondary | ICD-10-CM

## 2018-01-05 MED ORDER — TELMISARTAN 40 MG PO TABS: ORAL_TABLET | ORAL | 1 refills | Status: DC

## 2018-01-05 MED ORDER — PROMETHAZINE-DM 6.25-15 MG/5ML PO SYRP: 5.0000 mL | ORAL_SOLUTION | Freq: Four times a day (QID) | ORAL | 0 refills | Status: DC | PRN

## 2018-01-05 NOTE — Progress Notes (Signed)
Assessment and Plan:  Mathew Miller was seen today for referral.  Diagnoses and all orders for this visit:  Memory changes/dizziness Symptoms unchanged from visit 1 month ago by Dr. Melford Aase; now requesting referral for evaluation of ongoing dizziness and gradual memory changes. Discussed possible benefit of cardiology workup as well; will proceed with neuro referral as recommended by Dr. Melford Aase.  -     Ambulatory referral to Neurology  Other orders -     promethazine-dextromethorphan (PROMETHAZINE-DM) 6.25-15 MG/5ML syrup; Take 5 mLs by mouth 4 (four) times daily as needed for cough.  Further disposition pending results of labs. Discussed med's effects and SE's.   Over 15 minutes of exam, counseling, chart review, and critical decision making was performed.   Future Appointments  Date Time Provider [place]  02/27/2018  3:30 PM Vicie Mutters, PA-C [facility] None  06/26/2018 10:00 AM Unk Pinto, MD [facility] None    ------------------------------------------------------------------------------------------------------------------   HPI BP 102/68   Pulse (!) 58   Temp (!) 97.3 F (36.3 C)   Ht _0  (1.88 m)   Wt 189 lb (85.7 kg)   SpO2 98%   BMI 24.27 kg/m   72 y.o.Caucasian male with benign history of htn and hyperlipidemia without known complication presents accompanied by his wife to follow up 1 month after presenting for concerns of ongoing new dizziness and memory changes; he was seen by Dr. Melford Aase 1 month ago on 2/27 -Patient had recently fallen 2 days previously w/o head injury or LOC, but does reports ongoing intermittent dizziness, worse with position changes and exertion. Wife report his short term memory has progressively been declining. Imaging of brain has not been ordered; patient was strongly recommended a referral to neurology to discuss but declined at that time. Today reports symptoms are unchanged but ongoing and now requests referral for evaluation.     Past Medical History:  Diagnosis Date  . Constipation   . Hyperlipidemia   . Hypertension   . Prediabetes    no meds   . Vitamin D deficiency      Allergies  Allergen Reactions  . Hydrocodone-Acetaminophen Nausea Only  . Sudafed [Pseudoephedrine Hcl]     Urinary retention  . Tetanus Toxoids     Itching, burning feeling, ran a fever    Current Outpatient Medications on File Prior to Visit  Medication Sig  . ACCU-CHEK FASTCLIX LANCETS MISC CHECK BLOOD SUGAR 1 TIME DAILY  . ACCU-CHEK SMARTVIEW test strip CHECK BLOOD SUGAR 1 TIME DAILY  . Alcohol Swabs (B-D SINGLE USE SWABS REGULAR) PADS Check blood sugar 1 time daily-DX-R73.03  . atenolol (TENORMIN) 50 MG tablet TAKE 1 TABLET EVERY DAY (Patient taking differently: TAKE 1/2 TABLET EVERY DAY)  . benzonatate (TESSALON) 200 MG capsule Take 1 perle 3 x / day to prevent cough  . Blood Glucose Monitoring Suppl (ACCU-CHEK NANO SMARTVIEW) w/Device KIT CHECK BLOOD SUGAR 1 TIME DAILY-DX-R73.03  . levofloxacin (LEVAQUIN) 500 MG tablet Take 1 tablet daily with food for infection  . losartan (COZAAR) 50 MG tablet TAKE 1 TABLET EVERY DAY  . Magnesium 250 MG TABS Take 250 mg by mouth daily.  . NONFORMULARY OR COMPOUNDED ITEM Inject 0.4 mg into the skin as needed. Prostaglandin EL. 0.4 mm/20 mg as needed injected into the penis  . omeprazole (PRILOSEC) 20 MG capsule TAKE 1 CAPSULE TWICE DAILY  . PAZEO 0.7 % SOLN   . Polyethylene Glycol 3350 (MIRALAX PO) Take 17 g/day by mouth daily.  . pravastatin (PRAVACHOL) 40 MG  tablet TAKE 1 TABLET EVERY EVENING (Patient taking differently: TAKE 1/2 TABLET EVERY EVENING)  . promethazine (PHENERGAN) 25 MG tablet Takes 1 tablet every 4 hours as needed for nausea or vomititng.  . triamcinolone cream (KENALOG) 0.1 % Apply 1 application topically 3 (three) times daily. Apply sparingly 2 to 3 x day (do not use on face)   No current facility-administered medications on file prior to visit.     ROS: all  negative except above.   Physical Exam:  BP 102/68   Pulse (!) 58   Temp (!) 97.3 F (36.3 C)   Ht _0  (1.88 m)   Wt 189 lb (85.7 kg)   SpO2 98%   BMI 24.27 kg/m   General Appearance: Well nourished, in no apparent distress. Eyes: PERRLA, EOMs, conjunctiva no swelling or erythema ENT/Mouth:  No erythema, swelling, or exudate on post pharynx.  Tonsils not swollen or erythematous. ? Somewhat HOH.  Neck: Supple, thyroid normal.  Respiratory: Respiratory effort normal, BS equal bilaterally without rales, rhonchi, wheezing or stridor.  Cardio: RRR with no MRGs. Brisk peripheral pulses without edema.  Abdomen: Soft, + BS.  Non tender, no guarding, rebound, hernias, masses. Lymphatics: Non tender without lymphadenopathy.  Musculoskeletal: Symmetrical strength, slow gait.  Skin: Warm, dry without rashes, lesions, ecchymosis.  Neuro: Cranial nerves intact. Normal muscle tone, no cerebellar symptoms. Sensation intact.  Psych: Awake and oriented X 3, normal affect, Insight and Judgment are questionable, slow comprehension, somewhat argumentative     Izora Ribas, NP 1:16 PM [facility]

## 2018-01-05 NOTE — Addendum Note (Signed)
Addended by: Izora Ribas on: 01/05/2018 05:34 PM   Modules accepted: Orders

## 2018-01-10 ENCOUNTER — Other Ambulatory Visit: Payer: Medicare HMO

## 2018-01-10 ENCOUNTER — Encounter: Payer: Self-pay | Admitting: Neurology

## 2018-01-10 ENCOUNTER — Ambulatory Visit: Payer: Medicare HMO | Admitting: Neurology

## 2018-01-10 VITALS — BP 106/64 | HR 87 | Resp 16 | Ht 75.0 in | Wt 187.0 lb

## 2018-01-10 DIAGNOSIS — R292 Abnormal reflex: Secondary | ICD-10-CM | POA: Diagnosis not present

## 2018-01-10 DIAGNOSIS — R7989 Other specified abnormal findings of blood chemistry: Secondary | ICD-10-CM

## 2018-01-10 DIAGNOSIS — R413 Other amnesia: Secondary | ICD-10-CM

## 2018-01-10 DIAGNOSIS — I951 Orthostatic hypotension: Secondary | ICD-10-CM | POA: Diagnosis not present

## 2018-01-10 DIAGNOSIS — E538 Deficiency of other specified B group vitamins: Secondary | ICD-10-CM

## 2018-01-10 DIAGNOSIS — R6889 Other general symptoms and signs: Secondary | ICD-10-CM | POA: Diagnosis not present

## 2018-01-10 LAB — VITAMIN B12: Vitamin B-12: 235 pg/mL (ref 200–1100)

## 2018-01-10 NOTE — Progress Notes (Signed)
NEUROLOGY CONSULTATION NOTE  Mathew Phi. MRN: [id number] DOB: 10-13-1946  Referring provider: Dr. Melford Aase Primary care provider: Dr. Melford Aase  Reason for consult:  Dizziness, memory deficits  HISTORY OF PRESENT ILLNESS: Mathew Miller is a 72 year old male with hypertension, hyperlipidemia, prediabetes and sensorineural hearing loss who presents for dizziness and memory changes.  He is accompanied by his wife who supplements history.  He began experiencing dizziness since the summer.  Around that time, enalapril was switched to losartan due to cough.  He describes the dizziness as a sensation that he is going to pass out but has not lost consciousness.  It occurs when he is up and walking and resolves when he stops to rest.  There is no associated spinning sensation or unilateral numbness and weakness.  In November, losartan was stopped.  CBC demonstrated no anemia.  BMP demonstrated mildly elevated Cr 1.21, which is baseline or mildly improved compared to prior labs.  Electrolytes were unremarkable.  He has been on atenolol for many years.  On Saturday, atenolol was discontinued and he was started on telmisartan.  Since then, he has felt better.  He reports memory difficulty for about 6 months.  It is primarily word-finding difficulty.  He hasn't really exhibited repeating questions.  His wife does most of the driving.  He does not get disoriented on familiar routes.  His wife pays the bills.  He uses a pillbox.  He is able to bathe, dress and use the toilet independently.  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  He has no family history of dementia.  TSH from 11/29/17 was 4.83.  Currently being monitored.  PAST MEDICAL HISTORY: Past Medical History:  Diagnosis Date  . Constipation   . Hyperlipidemia   . Hypertension   . Prediabetes    no meds   . Vitamin D deficiency     PAST SURGICAL HISTORY: Past Surgical History:    Procedure Laterality Date  . CATARACT EXTRACTION, BILATERAL  2017  . COLONOSCOPY    . NO PAST SURGERIES      MEDICATIONS: Current Outpatient Medications on File Prior to Visit  Medication Sig Dispense Refill  . ACCU-CHEK FASTCLIX LANCETS MISC CHECK BLOOD SUGAR 1 TIME DAILY 102 each 3  . ACCU-CHEK SMARTVIEW test strip CHECK BLOOD SUGAR 1 TIME DAILY 100 each 3  . Alcohol Swabs (B-D SINGLE USE SWABS REGULAR) PADS Check blood sugar 1 time daily-DX-R73.03 100 each 4  . benzonatate (TESSALON) 200 MG capsule Take 1 perle 3 x / day to prevent cough 30 capsule 1  . Blood Glucose Monitoring Suppl (ACCU-CHEK NANO SMARTVIEW) w/Device KIT CHECK BLOOD SUGAR 1 TIME DAILY-DX-R73.03 1 kit 0  . Cholecalciferol (VITAMIN D3 PO) Take 2,500 Units by mouth daily.    . Magnesium 250 MG TABS Take 250 mg by mouth daily.    . NONFORMULARY OR COMPOUNDED ITEM Inject 0.4 mg into the skin as needed. Prostaglandin EL. 0.4 mm/20 mg as needed injected into the penis    . omeprazole (PRILOSEC) 20 MG capsule TAKE 1 CAPSULE TWICE DAILY 180 capsule 1  . PAZEO 0.7 % SOLN     . Polyethylene Glycol 3350 (MIRALAX PO) Take 17 g/day by mouth daily.    . pravastatin (PRAVACHOL) 40 MG tablet TAKE 1 TABLET EVERY EVENING (Patient taking differently: TAKE 1/2 TABLET EVERY EVENING) 90 tablet 1  . promethazine (PHENERGAN) 25 MG tablet Takes 1 tablet every  4 hours as needed for nausea or vomititng. 100 tablet 0  . promethazine-dextromethorphan (PROMETHAZINE-DM) 6.25-15 MG/5ML syrup Take 5 mLs by mouth 4 (four) times daily as needed for cough. 473 mL 0  . telmisartan (MICARDIS) 40 MG tablet Take 1/2-1 tab daily to maintain BP under 140/90. 90 tablet 1  . triamcinolone cream (KENALOG) 0.1 % Apply 1 application topically 3 (three) times daily. Apply sparingly 2 to 3 x day (do not use on face) 80 g 3   No current facility-administered medications on file prior to visit.     ALLERGIES: Allergies  Allergen Reactions  .  Hydrocodone-Acetaminophen Nausea Only  . Sudafed [Pseudoephedrine Hcl]     Urinary retention  . Tetanus Toxoids     Itching, burning feeling, ran a fever    FAMILY HISTORY: Family History  Problem Relation Age of Onset  . Hyperlipidemia Mother   . Glaucoma Mother   . Heart disease Father   . Hypertension Father   . Colon cancer Neg Hx   . Esophageal cancer Neg Hx   . Pancreatic cancer Neg Hx   . Prostate cancer Neg Hx   . Rectal cancer Neg Hx   . Stomach cancer Neg Hx     SOCIAL HISTORY: Social History   Socioeconomic History  . Marital status: Unknown    Spouse name: Not on file  . Number of children: Not on file  . Years of education: Not on file  . Highest education level: Not on file  Occupational History  . Occupation: retired   Scientific laboratory technician  . Financial resource strain: Not on file  . Food insecurity:    Worry: Not on file    Inability: Not on file  . Transportation needs:    Medical: Not on file    Non-medical: Not on file  Tobacco Use  . Smoking status: Never Smoker  . Smokeless tobacco: Never Used  Substance and Sexual Activity  . Alcohol use: Yes    Alcohol/week: 0.0 oz    Comment: very Rare /Beer.wine.  . Drug use: No  . Sexual activity: Not on file  Lifestyle  . Physical activity:    Days per week: Not on file    Minutes per session: Not on file  . Stress: Not on file  Relationships  . Social connections:    Talks on phone: Not on file    Gets together: Not on file    Attends religious service: Not on file    Active member of club or organization: Not on file    Attends meetings of clubs or organizations: Not on file    Relationship status: Not on file  . Intimate partner violence:    Fear of current or ex partner: Not on file    Emotionally abused: Not on file    Physically abused: Not on file    Forced sexual activity: Not on file  Other Topics Concern  . Not on file  Social History Narrative  . Not on file    REVIEW OF  SYSTEMS: Constitutional: No fevers, chills, or sweats, no generalized fatigue, change in appetite Eyes: No visual changes, double vision, eye pain Ear, nose and throat: No hearing loss, ear pain, nasal congestion, sore throat Cardiovascular: No chest pain, palpitations Respiratory:  No shortness of breath at rest or with exertion, wheezes GastrointestinaI: No nausea, vomiting, diarrhea, abdominal pain, fecal incontinence Genitourinary:  No dysuria, urinary retention or frequency Musculoskeletal:  No neck pain, back pain Integumentary: No  rash, pruritus, skin lesions Neurological: as above Psychiatric: No depression, insomnia, anxiety Endocrine: No palpitations, fatigue, diaphoresis, mood swings, change in appetite, change in weight, increased thirst Hematologic/Lymphatic:  No purpura, petechiae. Allergic/Immunologic: no itchy/runny eyes, nasal congestion, recent allergic reactions, rashes  PHYSICAL EXAM: Vitals:   01/10/18 0750  BP: 106/64  Pulse: 87  Resp: 16  SpO2: 98%  Orthostatic Vitals:   BP  HR Supine:  146/90  78  Sitting: 122/70  81 Standing: 76/50  138 General: No acute distress.  Patient appears well-groomed.  Head:  Normocephalic/atraumatic Eyes:  fundi examined but not visualized Neck: supple, no paraspinal tenderness, full range of motion Back: No paraspinal tenderness Heart: regular rate and rhythm Lungs: Clear to auscultation bilaterally. Vascular: No carotid bruits. Neurological Exam: Mental status: alert and oriented to person, place, season and season and month but not day, date or year, recent and remote memory intact, fund of knowledge intact, attention and concentration reduced, speech fluent and not dysarthric, language intact.  Did not copy intersecting pentagons correctly. MMSE - Mini Mental State Exam 01/10/2018  Orientation to time 2  Orientation to Place 4  Registration 3  Attention/ Calculation 2  Recall 3  Language- name 2 objects 2  Language-  repeat 1  Language- follow 3 step command 2  Language- read & follow direction 1  Write a sentence 1  Copy design 0  Total score 21   Cranial nerves: CN I: not tested CN II: pupils equal, round and reactive to light, visual fields intact CN III, IV, VI:  full range of motion, no nystagmus, no ptosis CN V: facial sensation intact CN VII: upper and lower face symmetric CN VIII: hearing intact CN IX, X: gag intact, uvula midline CN XI: sternocleidomastoid and trapezius muscles intact CN XII: tongue midline Bulk & Tone: normal, no fasciculations. Motor:  5/5 throughout  Sensation:  Pinprick and vibration sensation intact. Deep Tendon Reflexes:  3+ throughout, asymmetrically more brisk in right upper extremity, right Babinski. Finger to nose testing:  Without dysmetria.  Heel to shin:  Without dysmetria.  Gait:  Upright posture with decreased arms swing but no shuffling or ataxia. Romberg negative.  IMPRESSION: 1.  Dizziness secondary to Orthostatic Hypotension 2.  Memory deficits.  Etiology unclear.  He did have mildly elevated TSH (hypothyroidism may cause memory deficits). 3.  Hyperreflexia.  Incidental finding.  Warrants further investigation including MRI of brain and cervical spine for possible intracranial abnormality or cervical myelopathy.  PLAN: 1.  Follow up with PCP regarding blood pressure 2.  MRI of brain and cervical spine without contrast 3.  Check B12 and repeat TSH with free T3 and T4. 4.  Schedule for neuropsychological testing. 5.  Follow up after testing.  Thank you for allowing me to take part in the care of this patient.  Metta Clines, DO  CC:  Dr. Melford Aase

## 2018-01-10 NOTE — Patient Instructions (Addendum)
1.  We will check MRI of brain and cervical spine without contrast. We have sent a referral to [facility] for your MRI and they will call you directly to schedule your appt. They are located at [place]. If you need to contact them directly please call [phone].  2.  We will check B12, TSH, free T3 and T4 Your provider has requested that you have labwork completed today. Please go to [facility] ([id number]) on the second floor of this building before leaving the office today. You do not need to check in. If you are not called within 15 minutes please check with the front desk.  3.  We will schedule you for neurocognitive testing. 4.  Follow up after testing.

## 2018-01-11 DIAGNOSIS — H524 Presbyopia: Secondary | ICD-10-CM | POA: Diagnosis not present

## 2018-01-11 DIAGNOSIS — H52203 Unspecified astigmatism, bilateral: Secondary | ICD-10-CM | POA: Diagnosis not present

## 2018-01-11 DIAGNOSIS — Z961 Presence of intraocular lens: Secondary | ICD-10-CM | POA: Diagnosis not present

## 2018-01-11 LAB — TSH+T4F+T3FREE
Free T4: 1.18 ng/dL (ref 0.82–1.77)
T3, Free: 3.2 pg/mL (ref 2.0–4.4)
TSH: 3.75 u[IU]/mL (ref 0.450–4.500)

## 2018-01-12 ENCOUNTER — Telehealth: Payer: Self-pay

## 2018-01-12 NOTE — Telephone Encounter (Signed)
Called and spoke with [name], wife, Advsd of lab results and B12 supplement recommendation. She asked for lab values for B12, provided her with the 235 result.

## 2018-01-12 NOTE — Telephone Encounter (Signed)
-----   Message from Pieter Partridge, DO sent at 01/12/2018 12:44 PM EDT ----- Thyroid tests are normal.  B12 level is in a low normal range which may contribute to memory issues.  Recommend starting over the counter B12 supplement 1016mcg daily.

## 2018-01-19 ENCOUNTER — Other Ambulatory Visit: Payer: Self-pay | Admitting: Internal Medicine

## 2018-01-19 ENCOUNTER — Telehealth: Payer: Self-pay

## 2018-01-19 ENCOUNTER — Ambulatory Visit
Admission: RE | Admit: 2018-01-19 | Discharge: 2018-01-19 | Disposition: A | Discharge status: Home / Self Care | Payer: Medicare HMO | Source: Ambulatory Visit | Attending: Neurology | Admitting: Neurology

## 2018-01-19 DIAGNOSIS — R292 Abnormal reflex: Secondary | ICD-10-CM

## 2018-01-19 DIAGNOSIS — M4802 Spinal stenosis, cervical region: Secondary | ICD-10-CM | POA: Diagnosis not present

## 2018-01-19 DIAGNOSIS — R413 Other amnesia: Secondary | ICD-10-CM

## 2018-01-19 MED ORDER — VITAMIN B-12 2500 MCG SL SUBL: SUBLINGUAL_TABLET | SUBLINGUAL | 3 refills | Status: DC

## 2018-01-19 NOTE — Telephone Encounter (Signed)
-----   Message from Pieter Partridge, DO sent at 01/19/2018  1:47 PM EDT ----- MRI shows chronic changes in the brain that are often seen with people who have history of high blood pressure and high cholesterol.  There is no abnormality in the spinal cord in the neck.

## 2018-01-19 NOTE — Telephone Encounter (Signed)
Called and spoke with wife, [name]. Advsd her of MRI results

## 2018-01-19 NOTE — Telephone Encounter (Signed)
Pt's spouse left a VM message saying she was returning a call for the MRI results

## 2018-01-26 ENCOUNTER — Ambulatory Visit (INDEPENDENT_AMBULATORY_CARE_PROVIDER_SITE_OTHER): Payer: Medicare HMO | Admitting: Internal Medicine

## 2018-01-26 VITALS — BP 144/92 | HR 64 | Temp 97.0°F | Resp 16 | Ht 74.0 in | Wt 190.2 lb

## 2018-01-26 DIAGNOSIS — I951 Orthostatic hypotension: Secondary | ICD-10-CM | POA: Diagnosis not present

## 2018-01-26 DIAGNOSIS — R0989 Other specified symptoms and signs involving the circulatory and respiratory systems: Secondary | ICD-10-CM | POA: Diagnosis not present

## 2018-01-28 ENCOUNTER — Encounter: Payer: Self-pay | Admitting: Internal Medicine

## 2018-01-28 NOTE — Progress Notes (Signed)
  Subjective:    Patient ID: [name], male    DOB: [birth year], 72 y.o.   MRN: [id number]  HPI  Patient is a nice 72 yo [name] brought in by his wife for BP check as he had recent orthostatic drop of BP to 76/50 during Dementia evaluation by Dr Tomi Likens. Wife is monitoring postural BP's. MMSE scored 21/30.  Brain MRI showed only mild chronic changes and neurocognitive testing is pending.   Medication Sig  . VITAMIN D3 Take 2,500 Units by mouth daily.  Marland Kitchen VITAMIN B-12 2500 MCG SL Take 2,000 mcg  daily  . Magnesium 250 MG  Take 250 mg by mouth daily.  .  Prostaglandin EL. 0.4 mm/20 mg as needed injected into the penis  . omeprazole  20 MG capsule TAKE 1 CAPSULE TWICE DAILY  . PAZEO 0.7 % SOLN   . Polyethylene Glycol 3350  Take 17 g/day daily.  . pravastatin  40 MG tablet TAKE 1/2 TABLET EVERY EVENING  . Promethazine 25 MG tablet Takes 1 tablet every 4 hours as needed for nausea or vomititng.  . telmisartan 40 MG tablet Take 1/2-1 tab daily to maintain BP under 140/90.  Marland Kitchen triamcinolone crm  0.1 % Apply sparingly 2 to 3 x day (not on face)   Allergies  Allergen Reactions  . Hydrocodone-Acetaminophen Nausea Only  . Sudafed [Pseudoephedrine Hcl]     Urinary retention  . Tetanus Toxoids     Itching, burning feeling, ran a fever   Past Medical History:  Diagnosis Date  . Constipation   . Hyperlipidemia   . Hypertension   . Prediabetes    no meds   . Vitamin D deficiency    Review of Systems  10 point systems review negative except as above.    Objective:   Physical Exam  BP (!) 144/92 Comment: 144/92-sit and 136/86-stand  Pulse 64   Temp (!) 97 F (36.1 C)   Resp 16   Ht 6\' 2"  (1.88 m)   Wt 190 lb 3.2 oz (86.3 kg)   BMI 24.42 kg/m   Orthostatic Sitting BP 166/95   P 70 and   Standing BP 120/70    P 78.   HEENT - WNL. Neck - supple.  Chest - Clear equal BS. Cor - Nl HS. RRR w/o sig MGR. PP 1(+). No edema. MS- FROM w/o deformities.  Gait Nl. Neuro -  Nl w/o focal  abnormalities.    Assessment & Plan:   1. Labile hypertension  2. Orthostatic hypotension  - Continue orthostatic standing BP's (by wife) to use as a guide for administration of the Micardis 40 mg x 1/2 tab or call if question wrt BP vs meds.

## 2018-02-13 ENCOUNTER — Encounter: Payer: Self-pay | Admitting: Neurology

## 2018-02-20 ENCOUNTER — Telehealth: Payer: Self-pay | Admitting: Neurology

## 2018-02-20 ENCOUNTER — Telehealth: Payer: Self-pay | Admitting: Psychology

## 2018-02-20 NOTE — Telephone Encounter (Signed)
Patient wife called the ins company and  They state that we have to do a referral to them for the patient to be seen by Dr Si Raider. The ins company does not have anything from [place] and will not pay for [place] to see patient so she would like a phone call about this

## 2018-02-20 NOTE — Telephone Encounter (Signed)
Pt's spouse called and said the appointments for Dr Si Raider the insurance company is needing a prior authorization before coming to these appointments, please call and advise pt when this has been done

## 2018-02-23 NOTE — Telephone Encounter (Signed)
[place] [phone], spoke with Lanelle Bal. Gave her CPT codes [phone] and [phone] for the first visit. There is no PA required. BXI#[id number]  Called and LMOVM on hm#

## 2018-02-23 NOTE — Telephone Encounter (Signed)
Pt called and wants to know if this has been pre autorized

## 2018-02-24 NOTE — Telephone Encounter (Signed)
Pt left a VM message asking for a call back from [place] in regards to pre auth for Dr Si Raider

## 2018-02-24 NOTE — Telephone Encounter (Signed)
Spoke with Wife, she wanted to make sure she didn't need to do any thing further from the VM I left her.

## 2018-02-26 DIAGNOSIS — R413 Other amnesia: Secondary | ICD-10-CM | POA: Insufficient documentation

## 2018-02-26 DIAGNOSIS — Z6824 Body mass index (BMI) 24.0-24.9, adult: Secondary | ICD-10-CM | POA: Insufficient documentation

## 2018-02-26 DIAGNOSIS — K219 Gastro-esophageal reflux disease without esophagitis: Secondary | ICD-10-CM | POA: Insufficient documentation

## 2018-02-26 NOTE — Progress Notes (Addendum)
FOLLOW UP  Assessment and Plan:   Hypertension Having hypotension though asymptomatic - ? Accuracy of home readings -  Discussed with Dr. Melford Aase and will have him continue to check BP BID, stop taking telmisartan 20 mg daily and take only PRN if BP 150/90< Continue DASH diet.   Reminder to go to the ER if any CP, SOB, nausea, dizziness, severe HA, changes vision/speech, left arm numbness and tingling and jaw pain. Follow up in 2 weeks, call with any concerns  Cholesterol Currently at goal;  Continue low cholesterol diet and exercise.  Check lipid panel.   Other abnormal glucose Recent A1Cs at goal Discussed diet/exercise, weight management  Defer A1C; check BMP  BMI 24  Continue to recommend diet heavy in fruits and veggies and low in animal meats, cheeses, and dairy products, appropriate calorie intake Discuss exercise recommendations routinely Continue to monitor weight at each visit  Vitamin D Def At goal at last visit; continue supplementation to maintain goal of 70-100 Defer Vit D level  GERD Well managed on current medications Discussed diet, avoiding triggers and other lifestyle changes Check magnesium levels   Imbalance/ unsteady gait Referral to PT, continue follow up with neurology as recommended  Continue diet and meds as discussed. Further disposition pending results of labs. Discussed med's effects and SE's.   Over 30 minutes of exam, counseling, chart review, and critical decision making was performed.   Future Appointments  Date Time Provider [place]  03/09/2018 11:00 AM Kandis Nab, PsyD [facility] None  03/09/2018 12:00 PM [facility] TE[facility] None  03/10/2018 10:45 AM Liane Comber, NP [facility] None  03/21/2018 10:00 AM Kandis Nab, PsyD [facility] None  04/18/2018  1:30 PM Pieter Partridge, DO [facility] None  06/26/2018 10:00 AM Unk Pinto, MD [facility] None     ----------------------------------------------------------------------------------------------------------------------  HPI 72 y.o. male  presents for 3 month follow up on hypertension, cholesterol, glucose managment, weight and vitamin D deficiency. He was recently evaluated by Dr. Tomi Likens for dementia after noting some mild memory changes; MMSE scored 21/30. Brain MRI showed mild chronic changes and neurocognitive testing is pending. He also expresses concern about ongoing unsteady gait, for which he has been using a cane and avoiding leaving the house for the past 6 months. He is interested in trying PT for this.   he has a diagnosis of GERD which is currently managed by omeprazole 20 mg BID he reports symptoms is currently well controlled, and denies breakthrough reflux, burning in chest, hoarseness or cough.    BMI is Body mass index is 24.78 kg/m., he has been working on diet and exercise. Wt Readings from Last 3 Encounters:  02/27/18 193 lb (87.5 kg)  01/26/18 190 lb 3.2 oz (86.3 kg)  01/10/18 187 lb (84.8 kg)   He reports his BP has been running low at home, presents with log demonstrating very low values towards the evenings, as low as 75/41 - however he denies any symptoms such as fatigue, weakness, dizziness. He is currently taking telmisartan 20 mg daily at night.Today his BP is BP: 130/86  He does workout. He denies chest pain, shortness of breath, dizziness.   He is on cholesterol medication (pravastatin 20 mg daily) and denies myalgias. His cholesterol is at goal. The cholesterol last visit was:  Lab Results  Component Value Date   CHOL 136 11/29/2017   HDL 52 11/29/2017   LDLCALC 68 11/29/2017   TRIG 76 11/29/2017   CHOLHDL 2.6 11/29/2017  He has been working on diet and exercise for glucose management (hx of prediabetes), and denies foot ulcerations, increased appetite, nausea, paresthesia of the feet, polydipsia, polyuria, visual disturbances, vomiting and weight  loss. Last A1C in the office was:  Lab Results  Component Value Date   HGBA1C 5.3 11/29/2017   Patient is on Vitamin D supplement and at goal at recent check:    Lab Results  Component Value Date   VD25OH 68 11/29/2017        Current Medications:  Current Outpatient Medications on File Prior to Visit  Medication Sig  . ACCU-CHEK FASTCLIX LANCETS MISC CHECK BLOOD SUGAR 1 TIME DAILY  . ACCU-CHEK SMARTVIEW test strip CHECK BLOOD SUGAR 1 TIME DAILY  . Alcohol Swabs (B-D SINGLE USE SWABS REGULAR) PADS Check blood sugar 1 time daily-DX-R73.03  . Blood Glucose Monitoring Suppl (ACCU-CHEK NANO SMARTVIEW) w/Device KIT CHECK BLOOD SUGAR 1 TIME DAILY-DX-R73.03  . Cholecalciferol (VITAMIN D3 PO) Take 2,500 Units by mouth daily. Take 1/2 tablet daily  . Cyanocobalamin (VITAMIN B-12) 2500 MCG SUBL Dissolve 1 tablet under tongue daily (Patient taking differently: Take 2,500 mcg by mouth daily. Dissolve 1 tablet under tongue daily)  . Magnesium 250 MG TABS Take 250 mg by mouth daily.  . NONFORMULARY OR COMPOUNDED ITEM Inject 0.4 mg into the skin as needed. Prostaglandin EL. 0.4 mm/20 mg as needed injected into the penis  . omeprazole (PRILOSEC) 20 MG capsule TAKE 1 CAPSULE TWICE DAILY  . PAZEO 0.7 % SOLN   . Polyethylene Glycol 3350 (MIRALAX PO) Take 17 g/day by mouth daily.  . pravastatin (PRAVACHOL) 40 MG tablet TAKE 1 TABLET EVERY EVENING (Patient taking differently: TAKE 1/2 TABLET EVERY EVENING)  . promethazine (PHENERGAN) 25 MG tablet Takes 1 tablet every 4 hours as needed for nausea or vomititng.  . triamcinolone cream (KENALOG) 0.1 % Apply 1 application topically 3 (three) times daily. Apply sparingly 2 to 3 x day (do not use on face)   No current facility-administered medications on file prior to visit.      Allergies:  Allergies  Allergen Reactions  . Hydrocodone-Acetaminophen Nausea Only  . Sudafed [Pseudoephedrine Hcl]     Urinary retention  . Tetanus Toxoids     Itching,  burning feeling, ran a fever     Medical History:  Past Medical History:  Diagnosis Date  . Constipation   . Hyperlipidemia   . Hypertension   . Prediabetes    no meds   . Vitamin D deficiency    Family history- Reviewed and unchanged Social history- Reviewed and unchanged   Review of Systems:  Review of Systems  Constitutional: Negative for malaise/fatigue and weight loss.  HENT: Negative for hearing loss and tinnitus.   Eyes: Negative for blurred vision and double vision.  Respiratory: Negative for cough, shortness of breath and wheezing.   Cardiovascular: Negative for chest pain, palpitations, orthopnea, claudication and leg swelling.  Gastrointestinal: Negative for abdominal pain, blood in stool, constipation, diarrhea, heartburn, melena, nausea and vomiting.  Genitourinary: Negative.   Musculoskeletal: Negative for falls, joint pain and myalgias.  Skin: Negative for rash.  Neurological: Negative for dizziness, tingling, sensory change, weakness and headaches.  Endo/Heme/Allergies: Negative for polydipsia.  Psychiatric/Behavioral: Negative.   All other systems reviewed and are negative.   Physical Exam: BP 130/86   Pulse 79   Temp (!) 97.3 F (36.3 C)   Ht _0  (1.88 m)   Wt 193 lb (87.5 kg)   SpO2 97%  BMI 24.78 kg/m  Wt Readings from Last 3 Encounters:  02/27/18 193 lb (87.5 kg)  01/26/18 190 lb 3.2 oz (86.3 kg)  01/10/18 187 lb (84.8 kg)   General Appearance: Well nourished, in no apparent distress. Eyes: PERRLA, EOMs, conjunctiva no swelling or erythema Sinuses: No Frontal/maxillary tenderness ENT/Mouth: Ext aud canals clear, TMs without erythema, bulging. No erythema, swelling, or exudate on post pharynx.  Tonsils not swollen or erythematous. Hearing normal.  Neck: Supple, thyroid normal.  Respiratory: Respiratory effort normal, BS equal bilaterally without rales, rhonchi, wheezing or stridor.  Cardio: RRR with no MRGs. Brisk peripheral pulses without  edema.  Abdomen: Soft, + BS.  Non tender, no guarding, rebound, hernias, masses. Lymphatics: Non tender without lymphadenopathy.  Musculoskeletal: Full ROM, symmetrical strength,  Gait slow with cane.  Skin: Warm, dry without rashes, lesions, ecchymosis.  Neuro: Cranial nerves intact. No cerebellar symptoms.  Psych: Awake and oriented X 3, normal affect, Insight and Judgment appropriate.    Izora Ribas, NP 6:17 PM [facility]

## 2018-02-27 ENCOUNTER — Ambulatory Visit: Payer: Self-pay | Admitting: Adult Health

## 2018-02-27 ENCOUNTER — Encounter: Payer: Self-pay | Admitting: Adult Health

## 2018-02-27 ENCOUNTER — Ambulatory Visit (INDEPENDENT_AMBULATORY_CARE_PROVIDER_SITE_OTHER): Payer: Medicare HMO | Admitting: Adult Health

## 2018-02-27 VITALS — BP 130/86 | HR 79 | Temp 97.3°F | Ht 74.0 in | Wt 193.0 lb

## 2018-02-27 DIAGNOSIS — K219 Gastro-esophageal reflux disease without esophagitis: Secondary | ICD-10-CM

## 2018-02-27 DIAGNOSIS — Z87898 Personal history of other specified conditions: Secondary | ICD-10-CM

## 2018-02-27 DIAGNOSIS — R2681 Unsteadiness on feet: Secondary | ICD-10-CM

## 2018-02-27 DIAGNOSIS — E539 Vitamin B deficiency, unspecified: Secondary | ICD-10-CM | POA: Diagnosis not present

## 2018-02-27 DIAGNOSIS — R2689 Other abnormalities of gait and mobility: Secondary | ICD-10-CM | POA: Diagnosis not present

## 2018-02-27 DIAGNOSIS — Z79899 Other long term (current) drug therapy: Secondary | ICD-10-CM

## 2018-02-27 DIAGNOSIS — Z6824 Body mass index (BMI) 24.0-24.9, adult: Secondary | ICD-10-CM

## 2018-02-27 DIAGNOSIS — I10 Essential (primary) hypertension: Secondary | ICD-10-CM | POA: Diagnosis not present

## 2018-02-27 DIAGNOSIS — E782 Mixed hyperlipidemia: Secondary | ICD-10-CM | POA: Diagnosis not present

## 2018-02-27 DIAGNOSIS — E559 Vitamin D deficiency, unspecified: Secondary | ICD-10-CM | POA: Diagnosis not present

## 2018-02-27 MED ORDER — TELMISARTAN 20 MG PO TABS: ORAL_TABLET | ORAL | 0 refills | Status: DC

## 2018-02-27 NOTE — Patient Instructions (Signed)
Please monitor your blood pressure, as we get older our body can not respond to a low blood pressure as well as it did when we were younger, for this reason we want a bit higher of a blood pressure as you get older to avoid dizziness and fatigue which can lead to falls. Pease take 1/2 tab of telmisartan only if your blood pressure is above 150/90, and call if your blood pressure is consistently above 150/90 despite medication.   Hypotension Hypotension, commonly called low blood pressure, is when the force of blood pumping through your arteries is too weak. Arteries are blood vessels that carry blood from the heart throughout the body. When blood pressure is too low, you may not get enough blood to your brain or to the rest of your organs. This can cause weakness, light-headedness, rapid heartbeat, and fainting. Depending on the cause and severity, hypotension may be harmless (benign) or cause serious problems (critical). What are the causes? Possible causes of hypotension include:  Blood loss.  Loss of body fluids (dehydration).  Heart problems.  Hormone (endocrine) problems.  Pregnancy.  Severe infection.  Lack of certain nutrients.  Severe allergic reactions (anaphylaxis).  Certain medicines, such as blood pressure medicine or medicines that make the body lose excess fluids (diuretics). Sometimes, hypotension can be caused by not taking medicine as directed, such as taking too much of a certain medicine.  What increases the risk? Certain factors can make you more likely to develop hypotension, including:  Age. Risk increases as you get older.  Conditions that affect the heart or the central nervous system.  Taking certain medicines, such as blood pressure medicine or diuretics.  Being pregnant.  What are the signs or symptoms? Symptoms of this condition may include:  Weakness.  Light-headedness.  Dizziness.  Blurred vision.  Fatigue.  Rapid  heartbeat.  Fainting, in severe cases.  How is this diagnosed? This condition is diagnosed based on:  Your medical history.  Your symptoms.  Your blood pressure measurement. Your health care provider will check your blood pressure when you are: ? Lying down. ? Sitting. ? Standing.  A blood pressure reading is recorded as two numbers, such as "120 over 80" (or 120/80). The first ("top") number is called the systolic pressure. It is a measure of the pressure in your arteries as your heart beats. The second ("bottom") number is called the diastolic pressure. It is a measure of the pressure in your arteries when your heart relaxes between beats. Blood pressure is measured in a unit called mm Hg. Healthy blood pressure for adults is 120/80. If your blood pressure is below 90/60, you may be diagnosed with hypotension. Other information or tests that may be used to diagnose hypotension include:  Your other vital signs, such as your heart rate and temperature.  Blood tests.  Tilt table test. For this test, you will be safely secured to a table that moves you from a lying position to an upright position. Your heart rhythm and blood pressure will be monitored during the test.  How is this treated? Treatment for this condition may include:  Changing your diet. This may involve eating more salt (sodium) or drinking more water.  Taking medicines to raise your blood pressure.  Changing the dosage of certain medicines you are taking that might be lowering your blood pressure.  Wearing compression stockings. These stockings help to prevent blood clots and reduce swelling in your legs.  In some cases, you may need to  go to the hospital for:  Fluid replacement. This means you will receive fluids through an IV tube.  Blood replacement. This means you will receive donated blood through an IV tube (transfusion).  Treating an infection or heart problems, if this applies.  Monitoring. You may  need to be monitored while medicines that you are taking wear off.  Follow these instructions at home: Eating and drinking   Drink enough fluid to keep your urine clear or pale yellow.  Eat a healthy diet and follow instructions from your health care provider about eating or drinking restrictions. A healthy diet includes: ? Fresh fruits and vegetables. ? Whole grains. ? Lean meats. ? Low-fat dairy products.  Eat extra salt only as directed. Do not add extra salt to your diet unless your health care provider told you to do that.  Eat frequent, small meals.  Avoid standing up suddenly after eating. Medicines  Take over-the-counter and prescription medicines only as told by your health care provider. ? Follow instructions from your health care provider about changing the dosage of your current medicines, if this applies. ? Do not stop or adjust any of your medicines on your own. General instructions  Wear compression stockings as told by your health care provider.  Get up slowly from lying down or sitting positions. This gives your blood pressure a chance to adjust.  Avoid hot showers and excessive heat as directed by your health care provider.  Return to your normal activities as told by your health care provider. Ask your health care provider what activities are safe for you.  Do not use any products that contain nicotine or tobacco, such as cigarettes and e-cigarettes. If you need help quitting, ask your health care provider.  Keep all follow-up visits as told by your health care provider. This is important. Contact a health care provider if:  You vomit.  You have diarrhea.  You have a fever for more than 2-3 days.  You feel more thirsty than usual.  You feel weak and tired. Get help right away if:  You have chest pain.  You have a fast or irregular heartbeat.  You develop numbness in any part of your body.  You cannot move your arms or your legs.  You have  trouble speaking.  You become sweaty or feel light-headed.  You faint.  You feel short of breath.  You have trouble staying awake.  You feel confused. This information is not intended to replace advice given to you by your health care provider. Make sure you discuss any questions you have with your health care provider. Document Released: 10/04/2005 Document Revised: 04/23/2016 Document Reviewed: 03/25/2016 Elsevier Interactive Patient Education  2018 [employer].

## 2018-02-28 ENCOUNTER — Telehealth: Payer: Self-pay | Admitting: Neurology

## 2018-02-28 LAB — COMPLETE METABOLIC PANEL WITH GFR
AG Ratio: 2 (calc) (ref 1.0–2.5)
ALT: 27 U/L (ref 9–46)
AST: 18 U/L (ref 10–35)
Albumin: 4.1 g/dL (ref 3.6–5.1)
Alkaline phosphatase (APISO): 86 U/L (ref 40–115)
BUN/Creatinine Ratio: 14 (calc) (ref 6–22)
BUN: 17 mg/dL (ref 7–25)
CO2: 29 mmol/L (ref 20–32)
Calcium: 9.5 mg/dL (ref 8.6–10.3)
Chloride: 106 mmol/L (ref 98–110)
Creat: 1.24 mg/dL — ABNORMAL HIGH (ref 0.70–1.18)
GFR, Est African American: 66 mL/min/{1.73_m2} (ref >=60)
GFR, Est Non African American: 57 mL/min/{1.73_m2} — ABNORMAL LOW (ref >=60)
Globulin: 2.1 g/dL (calc) (ref 1.9–3.7)
Glucose, Bld: 103 mg/dL — ABNORMAL HIGH (ref 65–99)
Potassium: 4.4 mmol/L (ref 3.5–5.3)
Sodium: 142 mmol/L (ref 135–146)
Total Bilirubin: 0.5 mg/dL (ref 0.2–1.2)
Total Protein: 6.2 g/dL (ref 6.1–8.1)

## 2018-02-28 LAB — CBC WITH DIFFERENTIAL/PLATELET
Basophils Absolute: 50 cells/uL (ref 0–200)
Basophils Relative: 0.5 %
Eosinophils Absolute: 70 cells/uL (ref 15–500)
Eosinophils Relative: 0.7 %
HCT: 46.6 % (ref 38.5–50.0)
Hemoglobin: 15.5 g/dL (ref 13.2–17.1)
Lymphs Abs: 1690 cells/uL (ref 850–3900)
MCH: 29.5 pg (ref 27.0–33.0)
MCHC: 33.3 g/dL (ref 32.0–36.0)
MCV: 88.8 fL (ref 80.0–100.0)
MPV: 10.7 fL (ref 7.5–12.5)
Monocytes Relative: 7.7 %
Neutro Abs: 7420 cells/uL (ref 1500–7800)
Neutrophils Relative %: 74.2 %
Platelets: 206 10*3/uL (ref 140–400)
RBC: 5.25 10*6/uL (ref 4.20–5.80)
RDW: 13.2 % (ref 11.0–15.0)
Total Lymphocyte: 16.9 %
WBC mixed population: 770 cells/uL (ref 200–950)
WBC: 10 10*3/uL (ref 3.8–10.8)

## 2018-02-28 LAB — LIPID PANEL
Cholesterol: 143 mg/dL (ref <200)
HDL: 59 mg/dL (ref >40)
LDL Cholesterol (Calc): 68 mg/dL (calc)
Non-HDL Cholesterol (Calc): 84 mg/dL (calc) (ref <130)
Total CHOL/HDL Ratio: 2.4 (calc) (ref <5.0)
Triglycerides: 80 mg/dL (ref <150)

## 2018-02-28 LAB — MAGNESIUM: Magnesium: 2.1 mg/dL (ref 1.5–2.5)

## 2018-02-28 LAB — TSH: TSH: 2.22 mIU/L (ref 0.40–4.50)

## 2018-02-28 LAB — VITAMIN B12: Vitamin B-12: 1285 pg/mL — ABNORMAL HIGH (ref 200–1100)

## 2018-02-28 NOTE — Telephone Encounter (Signed)
Will research

## 2018-02-28 NOTE — Telephone Encounter (Signed)
Pt's spouse left a VM message saying the insurance needed a request sent to them for the testing appointment

## 2018-03-02 ENCOUNTER — Other Ambulatory Visit: Payer: Self-pay

## 2018-03-02 ENCOUNTER — Ambulatory Visit: Payer: Medicare HMO | Attending: Adult Health | Admitting: Rehabilitation

## 2018-03-02 ENCOUNTER — Encounter: Payer: Self-pay | Admitting: Rehabilitation

## 2018-03-02 DIAGNOSIS — R2681 Unsteadiness on feet: Secondary | ICD-10-CM | POA: Insufficient documentation

## 2018-03-02 DIAGNOSIS — M6281 Muscle weakness (generalized): Secondary | ICD-10-CM | POA: Diagnosis not present

## 2018-03-02 DIAGNOSIS — R293 Abnormal posture: Secondary | ICD-10-CM | POA: Diagnosis not present

## 2018-03-02 DIAGNOSIS — R2689 Other abnormalities of gait and mobility: Secondary | ICD-10-CM

## 2018-03-02 NOTE — Therapy (Signed)
[facility] [address] [place], [place], [address] Phone: [phone]   Fax:  [phone]  Physical Therapy Evaluation  Patient Details  Name: Mathew Miller. MRN: [id number] Date of Birth: 1946-08-20 Referring Provider: Janann Colonel, NP   Encounter Date: 03/02/2018  PT End of Session - 03/02/18 1526    Visit Number  1    Number of Visits  17    Date for PT Re-Evaluation  05/01/18    Authorization Type  [employer]    PT Start Time  7106    PT Stop Time  1400    PT Time Calculation (min)  43 min    Equipment Utilized During Treatment  Gait belt    Activity Tolerance  Patient tolerated treatment well    Behavior During Therapy  WFL for tasks assessed/performed       Past Medical History:  Diagnosis Date  . Constipation   . Hyperlipidemia   . Hypertension   . Prediabetes    no meds   . Vitamin D deficiency     Past Surgical History:  Procedure Laterality Date  . CATARACT EXTRACTION, BILATERAL  2017  . COLONOSCOPY    . NO PAST SURGERIES      There were no vitals filed for this visit.   Subjective Assessment - 03/02/18 1322    Subjective  "It all started when they started to change my blood pressure medications.  It has taken several months to get this figured out.  It has left me very weak."    Patient is accompained by:  Family member wife, [name]    Pertinent History  HTN, HDL, vitamin D deficiency, memory issues    Limitations  Walking;House hold activities    How long can you stand comfortably?  1/2 mile outdoors with wife (uses cane sometimes)    Patient Stated Goals  "I'd like to get back to being able to walk for an hour and get my strength back."     Currently in Pain?  No/denies         [facility] PT Assessment - 03/02/18 1325      Assessment   Medical Diagnosis  unsteady gait    Referring Provider  Janann Colonel, NP    Onset Date/Surgical Date  -- Has been feeling weak over the  past few months     Prior Therapy  None      Precautions   Precautions  Fall      Restrictions   Weight Bearing Restrictions  No      Balance Screen   Has the patient fallen in the past 6 months  Yes    How many times?  1    Has the patient had a decrease in activity level because of a fear of falling?   Yes    Is the patient reluctant to leave their home because of a fear of falling?   Yes      [place]  Private residence    Living Arrangements  Spouse/significant other    Available Help at Discharge  Family;Available PRN/intermittently wife works 1-2 days/week    Type of [place] to enter    Entrance Stairs-Number of Steps  10    Entrance Stairs-Rails  Right    Home Layout  Multi-level    Alternate Level Stairs-Number of Steps  10-12    Alternate Level Stairs-Rails  Right    Home Equipment  Cane - single point    Additional Comments  tub/shower       Prior Function   Level of Independence  Independent with basic ADLs;Independent with household mobility without device;Independent with community mobility with device wife does finances, medication mgmt    Vocation  Retired    Leisure  Would like to be able to keep going to [employer], getting out more      Cognition   Overall Cognitive Status  Impaired/Different from baseline Note some difficulty with processing and following commands    Area of Impairment  Memory      Sensation   Light Touch  Appears Intact    Hot/Cold  Appears Intact      Coordination   Gross Motor Movements are Fluid and Coordinated  Yes    Fine Motor Movements are Fluid and Coordinated  Yes      ROM / Strength   AROM / PROM / Strength  Strength      Strength   Overall Strength  Deficits    Overall Strength Comments  grossly 4/5 overall, some proximal hip weakness      Transfers   Transfers  Sit to Stand;Stand to Sit    Sit to Stand  6: Modified independent (Device/Increase time)    Five  time sit to stand comments   14.85 secs without support    Stand to Sit  6: Modified independent (Device/Increase time)      Ambulation/Gait   Ambulation/Gait  Yes    Ambulation/Gait Assistance  5: Supervision;4: Min guard without AD    Ambulation/Gait Assistance Details  Had pt ambulate without AD during session as he is doing so at home indoors.      Ambulation Distance (Feet)  300 Feet    Assistive device  None    Gait Pattern  Step-through pattern;Decreased arm swing - right;Decreased arm swing - left;Decreased stride length;Trunk flexed;Decreased dorsiflexion - right;Decreased dorsiflexion - left    Ambulation Surface  Level;Indoor    Gait velocity  3.03 ft/sec without device    Stairs  Yes    Stairs Assistance  5: Supervision    Stair Management Technique  One rail Right;Two rails;Alternating pattern;Forwards    Number of Stairs  4    Height of Stairs  6      Functional Gait  Assessment   Gait assessed   Yes    Gait Level Surface  Walks 20 ft in less than 7 sec but greater than 5.5 sec, uses assistive device, slower speed, mild gait deviations, or deviates 6-10 in outside of the 12 in walkway width.    Change in Gait Speed  Able to change speed, demonstrates mild gait deviations, deviates 6-10 in outside of the 12 in walkway width, or no gait deviations, unable to achieve a major change in velocity, or uses a change in velocity, or uses an assistive device.    Gait with Horizontal Head Turns  Performs head turns smoothly with slight change in gait velocity (eg, minor disruption to smooth gait path), deviates 6-10 in outside 12 in walkway width, or uses an assistive device.    Gait with Vertical Head Turns  Performs task with slight change in gait velocity (eg, minor disruption to smooth gait path), deviates 6 - 10 in outside 12 in walkway width or uses assistive device    Gait and Pivot Turn  Pivot turns safely in greater than 3 sec and stops  with no loss of balance, or pivot turns  safely within 3 sec and stops with mild imbalance, requires small steps to catch balance.    Step Over Obstacle  Is able to step over one shoe box (4.5 in total height) without changing gait speed. No evidence of imbalance.    Gait with Narrow Base of Support  Ambulates less than 4 steps heel to toe or cannot perform without assistance.    Gait with Eyes Closed  Walks 20 ft, slow speed, abnormal gait pattern, evidence for imbalance, deviates 10-15 in outside 12 in walkway width. Requires more than 9 sec to ambulate 20 ft.    Ambulating Backwards  Walks 20 ft, uses assistive device, slower speed, mild gait deviations, deviates 6-10 in outside 12 in walkway width.    Steps  Alternating feet, must use rail.    Total Score  17    FGA comment:  < 19 = high risk fall                Objective measurements completed on examination: See above findings.              PT Education - 03/02/18 1525    Education provided  Yes    Education Details  evaluation findings, POC, goals    Person(s) Educated  Patient;Spouse    Methods  Explanation    Comprehension  Verbalized understanding       PT Short Term Goals - 03/02/18 1952      PT SHORT TERM GOAL #1   Title  Pt will initiate HEP in order to improve functional mobility.  (Target Date: 04/01/18)    Time  4    Period  Weeks    Status  New    Target Date  04/01/18      PT SHORT TERM GOAL #2   Title  Pt will improve 5TSS time to </=12 seconds in order to indicate improved strength and decreased fall risk.     Time  4    Period  Weeks    Status  New      PT SHORT TERM GOAL #3   Title  Pt will improve FGA to 20/30 in order to indicate decreased fall risk.      Time  4    Period  Weeks    Status  New      PT SHORT TERM GOAL #4   Title  Will assess 6MWT and improve distance by 150' in order to indicate improved functional endurance.      Time  4    Period  Weeks    Status  New      PT SHORT TERM GOAL #5   Title  Pt will  ambulate 1000' over varying outdoor surfaces w/o AD at mod I level in order to indicate improved community negotiation.     Time  4    Period  Weeks    Status  New        PT Long Term Goals - 03/02/18 1954      PT LONG TERM GOAL #1   Title  Pt will be independent with final HEP in order to indicate improved functional mobility.  (Target Date: 05/01/18)    Time  8    Period  Weeks    Status  New    Target Date  05/01/18      PT LONG TERM GOAL #2   Title  Pt will improve FGA to >/=  23/30 in order to indicate decreased fall risk.      Time  8    Period  Weeks    Status  New      PT LONG TERM GOAL #3   Title  Pt will improve 6MWT by 300' from baseline in order to indicate improved functional endurance.     Time  8    Period  Weeks    Status  New      PT LONG TERM GOAL #4   Title  Pt will tolerate ambulation x 30 minutes without seated rest break in order to indicate return to leisure walking program.      Time  8    Period  Weeks    Status  New      PT LONG TERM GOAL #5   Title  Pt will verbalize plans for community fitness once therapy has ended to maintain gains made in therapy.     Time  8    Period  Weeks    Status  New             Plan - 03/02/18 1527    Clinical Impression Statement  Pt presents with decreasing BLE strength, balance and endurance over the past 6 months.  Note that he was having issues with blood pressure medication and had instance of near passing out and falling at a [employer] approx 6 months ago.  Note history of HTN, HDL, vitamin D deficiency and memory issues that could impair progress in therapy.  Upon PT evaluation, note gait speed of 3.03 ft/sec without AD however wife reports this is not his normal speed when in community/home, 5TSS time of 14.85 secs indicative of decreased functional strength, and FGA score of 17/30 indicative of high fall risk.  Pt will benefit from skilled OP neuro PT in order to address deficits.      History and  Personal Factors relevant to plan of care:  see above    Clinical Presentation  Stable    Clinical Presentation due to:  see above    Clinical Decision Making  Low    Rehab Potential  Good    Clinical Impairments Affecting Rehab Potential  cognitive deficits-negative, balance seems to have improved since switching BP medication    PT Frequency  2x / week    PT Duration  8 weeks may not need whole 8 weeks    PT Treatment/Interventions  ADLs/Self Care Home Management;DME Instruction;Gait training;Stair training;Functional mobility training;Therapeutic activities;Therapeutic exercise;Balance training;Neuromuscular re-education;Patient/family education;Vestibular;Passive range of motion    PT Next Visit Plan  6MWT, establish HEP for functional strengthening and balance, gait outdoors as able with/without device.     Consulted and Agree with Plan of Care  Patient       Patient will benefit from skilled therapeutic intervention in order to improve the following deficits and impairments:  Decreased activity tolerance, Decreased balance, Decreased cognition, Decreased endurance, Decreased strength, Impaired perceived functional ability, Impaired flexibility, Improper body mechanics  Visit Diagnosis: Unsteadiness on feet  Muscle weakness (generalized)  Other abnormalities of gait and mobility  Abnormal posture     Problem List Patient Active Problem List   Diagnosis Date Noted  . Imbalance 02/27/2018  . BMI 24.0-24.9, adult 02/26/2018  . Acid reflux 02/26/2018  . Memory changes 02/26/2018  . Vitamin D deficiency 03/01/2014  . Medication management 03/01/2014  . Hyperlipidemia   . History of prediabetes   . HTN (hypertension) 10/09/2013    Raquel Sarna  Rinaldo Cloud, PT, MPT [facility] [address] [place], [place], [address] Phone: [phone]   Fax:  [phone] 03/02/18, 7:58 PM  Name: Mathew Miller. MRN: [id number] Date of Birth:  1946-04-29

## 2018-03-02 NOTE — Addendum Note (Signed)
Addended by: Cameron Sprang A on: 03/02/2018 08:00 PM   Modules accepted: Orders

## 2018-03-06 ENCOUNTER — Other Ambulatory Visit: Payer: Self-pay | Admitting: Internal Medicine

## 2018-03-09 ENCOUNTER — Encounter: Payer: Medicare HMO | Admitting: Psychology

## 2018-03-10 ENCOUNTER — Ambulatory Visit: Payer: Self-pay | Admitting: Adult Health

## 2018-03-14 ENCOUNTER — Ambulatory Visit: Payer: Self-pay | Admitting: Adult Health

## 2018-03-16 ENCOUNTER — Ambulatory Visit: Payer: Medicare HMO

## 2018-03-20 ENCOUNTER — Encounter: Payer: Self-pay | Admitting: Physical Therapy

## 2018-03-20 ENCOUNTER — Ambulatory Visit: Payer: Medicare HMO | Admitting: Physical Therapy

## 2018-03-20 DIAGNOSIS — R2689 Other abnormalities of gait and mobility: Secondary | ICD-10-CM

## 2018-03-20 DIAGNOSIS — M6281 Muscle weakness (generalized): Secondary | ICD-10-CM

## 2018-03-20 NOTE — Patient Instructions (Signed)
Access Code: [email or web address]  URL: [email or web address]  Date: 03/20/2018  Prepared by: Lyndee Hensen   Exercises  Seated March - 10 reps - 2 sets - 2x daily  Seated Long Arc Quad - 10 reps - 2 sets - 2x daily  Standing Hip Abduction - 10 reps - 2 sets - 1x daily  Standing Marching - 10 reps - 2 sets - 1x daily  Sit to Stand - 5 reps - 2 sets - 1x daily

## 2018-03-21 ENCOUNTER — Encounter: Payer: Medicare HMO | Admitting: Psychology

## 2018-03-21 NOTE — Therapy (Signed)
[facility] [address] [place], [place], [address] Phone: [phone]   Fax:  [phone]  Physical Therapy Treatment  Patient Details  Name: Mathew Miller. MRN: [id number] Date of Birth: 1946-04-04 Referring Provider: Janann Colonel, NP   Encounter Date: 03/20/2018  PT End of Session - 03/20/18 1537    Visit Number  2    Number of Visits  17    Date for PT Re-Evaluation  05/01/18    Authorization Type  [employer]    PT Start Time  1515    PT Stop Time  1557    PT Time Calculation (min)  42 min    Equipment Utilized During Treatment  Gait belt    Activity Tolerance  Patient tolerated treatment well    Behavior During Therapy  WFL for tasks assessed/performed       Past Medical History:  Diagnosis Date  . Constipation   . Hyperlipidemia   . Hypertension   . Prediabetes    no meds   . Vitamin D deficiency     Past Surgical History:  Procedure Laterality Date  . CATARACT EXTRACTION, BILATERAL  2017  . COLONOSCOPY    . NO PAST SURGERIES      There were no vitals filed for this visit.  Subjective Assessment - 03/20/18 1539    Subjective  Pt with no new complaints. States he wants to improve his balance, and overall strength, would like to get back to walking program and gym program.   (Pended)     Currently in Pain?  No/denies  (Pended)                        [facility] Adult PT Treatment/Exercise - 03/21/18 0001      Ambulation/Gait   Gait Comments  35 ft x8 with close supervision, no AD, with education on speed and increasing step height.       Exercises   Exercises  Knee/Hip      Knee/Hip Exercises: Standing   Heel Raises  20 reps    Hip Flexion  20 reps;Knee bent    Hip Abduction  2 sets;10 reps;Both    Stairs  up/down 5 steps with 2 railings, reciprocol pattern, decreased step height, education for safety     Other Standing Knee Exercises  Toe taps on 6 in steps x20;     Other Standing  Knee Exercises  Weight shifts L/R and A/P x25 each;  Regular stance with UE flexion x10; Regular stance with head turns x10;       Knee/Hip Exercises: Seated   Long Arc Quad  2 sets;10 reps;Both    Marching  20 reps    Sit to [employer]  2 sets;5 reps             PT Education - 03/20/18 1530    Education provided  Yes    Education Details  Initial HEP for LE strength    Person(s) Educated  Patient    Methods  Explanation;Demonstration;Verbal cues;Handout    Comprehension  Verbalized understanding;Need further instruction       PT Short Term Goals - 03/02/18 1952      PT SHORT TERM GOAL #1   Title  Pt will initiate HEP in order to improve functional mobility.  (Target Date: 04/01/18)    Time  4    Period  Weeks    Status  New    Target Date  04/01/18      PT SHORT TERM GOAL #2   Title  Pt will improve 5TSS time to </=12 seconds in order to indicate improved strength and decreased fall risk.     Time  4    Period  Weeks    Status  New      PT SHORT TERM GOAL #3   Title  Pt will improve FGA to 20/30 in order to indicate decreased fall risk.      Time  4    Period  Weeks    Status  New      PT SHORT TERM GOAL #4   Title  Will assess 6MWT and improve distance by 150' in order to indicate improved functional endurance.      Time  4    Period  Weeks    Status  New      PT SHORT TERM GOAL #5   Title  Pt will ambulate 1000' over varying outdoor surfaces w/o AD at mod I level in order to indicate improved community negotiation.     Time  4    Period  Weeks    Status  New        PT Long Term Goals - 03/02/18 1954      PT LONG TERM GOAL #1   Title  Pt will be independent with final HEP in order to indicate improved functional mobility.  (Target Date: 05/01/18)    Time  8    Period  Weeks    Status  New    Target Date  05/01/18      PT LONG TERM GOAL #2   Title  Pt will improve FGA to >/=23/30 in order to indicate decreased fall risk.      Time  8    Period  Weeks     Status  New      PT LONG TERM GOAL #3   Title  Pt will improve 6MWT by 300' from baseline in order to indicate improved functional endurance.     Time  8    Period  Weeks    Status  New      PT LONG TERM GOAL #4   Title  Pt will tolerate ambulation x 30 minutes without seated rest break in order to indicate return to leisure walking program.      Time  8    Period  Weeks    Status  New      PT LONG TERM GOAL #5   Title  Pt will verbalize plans for community fitness once therapy has ended to maintain gains made in therapy.     Time  8    Period  Weeks    Status  New            Plan - 03/21/18 0945    Clinical Impression Statement  Pt performed LE ther ex for strengthening today, given handout to perform at home with HEP. Pt requires cueing for mechanics and speed. Pt started with initial balance exercises today. Pt with requires cueing for step height with stair negotiation as well as ambulation on flat surface. Pt given several seated rest breaks between activities today. Plan to progress ther ex and balance as tolerated.     Rehab Potential  Good    Clinical Impairments Affecting Rehab Potential  cognitive deficits-negative, balance seems to have improved since switching BP medication    PT Frequency  2x / week  PT Duration  8 weeks may not need whole 8 weeks    PT Treatment/Interventions  ADLs/Self Care Home Management;DME Instruction;Gait training;Stair training;Functional mobility training;Therapeutic activities;Therapeutic exercise;Balance training;Neuromuscular re-education;Patient/family education;Vestibular;Passive range of motion    PT Next Visit Plan  6MWT, establish HEP for functional strengthening and balance, gait outdoors as able with/without device.     Consulted and Agree with Plan of Care  Patient       Patient will benefit from skilled therapeutic intervention in order to improve the following deficits and impairments:  Decreased activity tolerance,  Decreased balance, Decreased cognition, Decreased endurance, Decreased strength, Impaired perceived functional ability, Impaired flexibility, Improper body mechanics  Visit Diagnosis: Other abnormalities of gait and mobility  Muscle weakness (generalized)     Problem List Patient Active Problem List   Diagnosis Date Noted  . Imbalance 02/27/2018  . BMI 24.0-24.9, adult 02/26/2018  . Acid reflux 02/26/2018  . Memory changes 02/26/2018  . Vitamin D deficiency 03/01/2014  . Medication management 03/01/2014  . Hyperlipidemia   . History of prediabetes   . HTN (hypertension) 10/09/2013   Lyndee Hensen, PT, DPT 9:55 AM  03/21/18   [facility] [address] [place], [place], [address] Phone: [phone]   Fax:  [phone]  Name: Kagan Mutchler. MRN: [id number] Date of Birth: April 25, 1946

## 2018-03-22 NOTE — Telephone Encounter (Signed)
I have already spoken with the insurance company concerning the neuropsych tests

## 2018-03-23 ENCOUNTER — Ambulatory Visit (INDEPENDENT_AMBULATORY_CARE_PROVIDER_SITE_OTHER): Payer: Medicare HMO | Admitting: Physical Therapy

## 2018-03-23 ENCOUNTER — Encounter: Payer: Self-pay | Admitting: Physical Therapy

## 2018-03-23 DIAGNOSIS — M6281 Muscle weakness (generalized): Secondary | ICD-10-CM

## 2018-03-23 DIAGNOSIS — R2689 Other abnormalities of gait and mobility: Secondary | ICD-10-CM | POA: Diagnosis not present

## 2018-03-23 NOTE — Therapy (Signed)
[facility] [address] [place], [place], [address] Phone: [phone]   Fax:  [phone]  Physical Therapy Treatment  Patient Details  Name: Mathew Miller. MRN: [id number] Date of Birth: 09-08-1946 Referring Provider: Janann Colonel, NP   Encounter Date: 03/23/2018  PT End of Session - 03/23/18 1431    Visit Number  3  (Pended)     Number of Visits  56  (Pended)     Date for PT Re-Evaluation  05/01/18  (Pended)     Authorization Type  [employer]  (Pended)     PT Start Time  1353  (Pended)     PT Stop Time  1435  (Pended)     PT Time Calculation (min)  42 min  (Pended)     Equipment Utilized During Treatment  Gait belt  (Pended)     Activity Tolerance  Patient tolerated treatment well  (Pended)     Behavior During Therapy  WFL for tasks assessed/performed  (Pended)        Past Medical History:  Diagnosis Date  . Constipation   . Hyperlipidemia   . Hypertension   . Prediabetes    no meds   . Vitamin D deficiency     Past Surgical History:  Procedure Laterality Date  . CATARACT EXTRACTION, BILATERAL  2017  . COLONOSCOPY    . NO PAST SURGERIES      There were no vitals filed for this visit.  Subjective Assessment - 03/23/18 2235    Subjective  Pt with no new complaints today. He states he has not done HEP.                        [facility] Adult PT Treatment/Exercise - 03/23/18 1419      Ambulation/Gait   Gait Comments  35 ft x8 with close supervision, no AD, with education on speed and increasing step height.       Exercises   Exercises  Knee/Hip      Knee/Hip Exercises: Standing   Heel Raises  20 reps    Hip Flexion  20 reps;Knee bent    Hip Abduction  Both;3 sets;5 reps    Forward Step Up  10 reps;Both    Stairs  --    Other Standing Knee Exercises  Toe taps on 6 in steps x20; lateral stepping with weight shifting x5 bil;     Other Standing Knee Exercises  Weight shifts L/R and A/P x25  [employer] Ex;  Tandem stance 30 sec x2 bil; Regular stance with head turns x10;       Knee/Hip Exercises: Seated   Long Arc Quad  2 sets;10 reps;Both    Marching  --    Sit to [employer]  2 sets;5 reps               PT Short Term Goals - 03/02/18 1952      PT SHORT TERM GOAL #1   Title  Pt will initiate HEP in order to improve functional mobility.  (Target Date: 04/01/18)    Time  4    Period  Weeks    Status  New    Target Date  04/01/18      PT SHORT TERM GOAL #2   Title  Pt will improve 5TSS time to </=12 seconds in order to indicate improved strength and decreased fall risk.     Time  4  Period  Weeks    Status  New      PT SHORT TERM GOAL #3   Title  Pt will improve FGA to 20/30 in order to indicate decreased fall risk.      Time  4    Period  Weeks    Status  New      PT SHORT TERM GOAL #4   Title  Will assess 6MWT and improve distance by 150' in order to indicate improved functional endurance.      Time  4    Period  Weeks    Status  New      PT SHORT TERM GOAL #5   Title  Pt will ambulate 1000' over varying outdoor surfaces w/o AD at mod I level in order to indicate improved community negotiation.     Time  4    Period  Weeks    Status  New        PT Long Term Goals - 03/02/18 1954      PT LONG TERM GOAL #1   Title  Pt will be independent with final HEP in order to indicate improved functional mobility.  (Target Date: 05/01/18)    Time  8    Period  Weeks    Status  New    Target Date  05/01/18      PT LONG TERM GOAL #2   Title  Pt will improve FGA to >/=23/30 in order to indicate decreased fall risk.      Time  8    Period  Weeks    Status  New      PT LONG TERM GOAL #3   Title  Pt will improve 6MWT by 300' from baseline in order to indicate improved functional endurance.     Time  8    Period  Weeks    Status  New      PT LONG TERM GOAL #4   Title  Pt will tolerate ambulation x 30 minutes without seated rest break in order to indicate  return to leisure walking program.      Time  8    Period  Weeks    Status  New      PT LONG TERM GOAL #5   Title  Pt will verbalize plans for community fitness once therapy has ended to maintain gains made in therapy.     Time  8    Period  Weeks    Status  New            Plan - 03/23/18 2237    Clinical Impression Statement  Pt with much difficulty following directions for sequencing of movement today, with stairs, toe taps and lateral stepping. He requires max cueing for increased step height with step ups and ambulation. Pt will benefit from continued care.     Rehab Potential  Good    Clinical Impairments Affecting Rehab Potential  cognitive deficits-negative, balance seems to have improved since switching BP medication    PT Frequency  2x / week    PT Duration  8 weeks may not need whole 8 weeks    PT Treatment/Interventions  ADLs/Self Care Home Management;DME Instruction;Gait training;Stair training;Functional mobility training;Therapeutic activities;Therapeutic exercise;Balance training;Neuromuscular re-education;Patient/family education;Vestibular;Passive range of motion    PT Next Visit Plan  6MWT, establish HEP for functional strengthening and balance, gait outdoors as able with/without device.     Consulted and Agree with Plan of Care  Patient  Patient will benefit from skilled therapeutic intervention in order to improve the following deficits and impairments:  Decreased activity tolerance, Decreased balance, Decreased cognition, Decreased endurance, Decreased strength, Impaired perceived functional ability, Impaired flexibility, Improper body mechanics  Visit Diagnosis: Other abnormalities of gait and mobility  Muscle weakness (generalized)     Problem List Patient Active Problem List   Diagnosis Date Noted  . Imbalance 02/27/2018  . BMI 24.0-24.9, adult 02/26/2018  . Acid reflux 02/26/2018  . Memory changes 02/26/2018  . Vitamin D deficiency  03/01/2014  . Medication management 03/01/2014  . Hyperlipidemia   . History of prediabetes   . HTN (hypertension) 10/09/2013    Lyndee Hensen, PT, DPT 10:41 PM  03/23/18    [facility] [place], [place], [address] Phone: [phone]   Fax:  [phone]  Name: Mathew Miller. MRN: [id number] Date of Birth: 02/20/1946

## 2018-03-26 ENCOUNTER — Other Ambulatory Visit: Payer: Self-pay | Admitting: Internal Medicine

## 2018-03-27 ENCOUNTER — Ambulatory Visit (INDEPENDENT_AMBULATORY_CARE_PROVIDER_SITE_OTHER): Payer: Medicare HMO | Admitting: Physical Therapy

## 2018-03-27 ENCOUNTER — Encounter

## 2018-03-27 ENCOUNTER — Encounter: Payer: Self-pay | Admitting: Physical Therapy

## 2018-03-27 ENCOUNTER — Ambulatory Visit: Payer: Commercial Managed Care - HMO | Admitting: Neurology

## 2018-03-27 DIAGNOSIS — R2689 Other abnormalities of gait and mobility: Secondary | ICD-10-CM

## 2018-03-27 DIAGNOSIS — M6281 Muscle weakness (generalized): Secondary | ICD-10-CM | POA: Diagnosis not present

## 2018-03-27 NOTE — Therapy (Signed)
[facility] [address] [place], [place], [address] Phone: [phone]   Fax:  [phone]  Physical Therapy Treatment  Patient Details  Name: Mathew Miller. MRN: [id number] Date of Birth: [birth year] Referring Provider: Janann Colonel, NP   Encounter Date: 03/27/2018  PT End of Session - 03/27/18 1557    Visit Number  4    Number of Visits  17    Date for PT Re-Evaluation  05/01/18    Authorization Type  [employer]    PT Start Time  1500    PT Stop Time  1550    PT Time Calculation (min)  50 min    Equipment Utilized During Treatment  Gait belt    Activity Tolerance  Patient tolerated treatment well    Behavior During Therapy  WFL for tasks assessed/performed       Past Medical History:  Diagnosis Date  . Constipation   . Hyperlipidemia   . Hypertension   . Prediabetes    no meds   . Vitamin D deficiency     Past Surgical History:  Procedure Laterality Date  . CATARACT EXTRACTION, BILATERAL  2017  . COLONOSCOPY    . NO PAST SURGERIES      There were no vitals filed for this visit.  Subjective Assessment - 03/27/18 1556    Subjective  Pt states that he has been doing HEP. He states mild leg fatigue over the weekend, from increased HEP and walking. ONe day walked 1 mi, and .5 the other day.     Currently in Pain?  No/denies                       [facility] Adult PT Treatment/Exercise - 03/27/18 1507      Ambulation/Gait   Gait Comments  35 ft x4: Stepping over cones (CGA) 35 ft x6; Weaving around cones (CGA) 35 ft x6;       Exercises   Exercises  Knee/Hip      Knee/Hip Exercises: Standing   Heel Raises  20 reps    Hip Flexion  20 reps;Knee bent    Hip Abduction  Both;2 sets;10 reps    Forward Step Up  10 reps;Both    Other Standing Knee Exercises  Toe taps on 6 in steps x20;    Other Standing Knee Exercises  Weight shifts L/R and A/P x25 eachon Air Ex;  Reaching outside of BOS x10 bil;         Knee/Hip Exercises: Seated   Long Arc Quad  2 sets;10 reps;Both    Marching  20 reps    Sit to [employer]  1 set;10 reps               PT Short Term Goals - 03/02/18 1952      PT SHORT TERM GOAL #1   Title  Pt will initiate HEP in order to improve functional mobility.  (Target Date: 04/01/18)    Time  4    Period  Weeks    Status  New    Target Date  04/01/18      PT SHORT TERM GOAL #2   Title  Pt will improve 5TSS time to </=12 seconds in order to indicate improved strength and decreased fall risk.     Time  4    Period  Weeks    Status  New      PT SHORT TERM GOAL #3  Title  Pt will improve FGA to 20/30 in order to indicate decreased fall risk.      Time  4    Period  Weeks    Status  New      PT SHORT TERM GOAL #4   Title  Will assess 6MWT and improve distance by 150' in order to indicate improved functional endurance.      Time  4    Period  Weeks    Status  New      PT SHORT TERM GOAL #5   Title  Pt will ambulate 1000' over varying outdoor surfaces w/o AD at mod I level in order to indicate improved community negotiation.     Time  4    Period  Weeks    Status  New        PT Long Term Goals - 03/02/18 1954      PT LONG TERM GOAL #1   Title  Pt will be independent with final HEP in order to indicate improved functional mobility.  (Target Date: 05/01/18)    Time  8    Period  Weeks    Status  New    Target Date  05/01/18      PT LONG TERM GOAL #2   Title  Pt will improve FGA to >/=23/30 in order to indicate decreased fall risk.      Time  8    Period  Weeks    Status  New      PT LONG TERM GOAL #3   Title  Pt will improve 6MWT by 300' from baseline in order to indicate improved functional endurance.     Time  8    Period  Weeks    Status  New      PT LONG TERM GOAL #4   Title  Pt will tolerate ambulation x 30 minutes without seated rest break in order to indicate return to leisure walking program.      Time  8    Period  Weeks    Status   New      PT LONG TERM GOAL #5   Title  Pt will verbalize plans for community fitness once therapy has ended to maintain gains made in therapy.     Time  8    Period  Weeks    Status  New            Plan - 03/27/18 1600    Clinical Impression Statement  Ther ex performed for LE strength today, pt requires less cuing for mechanics. Pt did well with weight shifts and reaching outside of BOS. He has significant difficulty with dynamic gait, walking around and over cones, and requires max cuing for planning and sequencing of this. He demonstrates significant speed decrease with this, and requires extra time for planning and stepping. Pt also with diffiuclty following commands for steps ups and toe taps, even with demonstration and explanation prior ot performing. Pt to benefit from continued practice with dynamic gait and motor planning.     Rehab Potential  Good    Clinical Impairments Affecting Rehab Potential  cognitive deficits-negative, balance seems to have improved since switching BP medication    PT Frequency  2x / week    PT Duration  8 weeks may not need whole 8 weeks    PT Treatment/Interventions  ADLs/Self Care Home Management;DME Instruction;Gait training;Stair training;Functional mobility training;Therapeutic activities;Therapeutic exercise;Balance training;Neuromuscular re-education;Patient/family education;Vestibular;Passive range of motion  PT Next Visit Plan  6MWT, establish HEP for functional strengthening and balance, gait outdoors as able with/without device.     Consulted and Agree with Plan of Care  Patient       Patient will benefit from skilled therapeutic intervention in order to improve the following deficits and impairments:  Decreased activity tolerance, Decreased balance, Decreased cognition, Decreased endurance, Decreased strength, Impaired perceived functional ability, Impaired flexibility, Improper body mechanics  Visit Diagnosis: Other abnormalities of  gait and mobility  Muscle weakness (generalized)     Problem List Patient Active Problem List   Diagnosis Date Noted  . Imbalance 02/27/2018  . BMI 24.0-24.9, adult 02/26/2018  . Acid reflux 02/26/2018  . Memory changes 02/26/2018  . Vitamin D deficiency 03/01/2014  . Medication management 03/01/2014  . Hyperlipidemia   . History of prediabetes   . HTN (hypertension) 10/09/2013    Lyndee Hensen, PT, DPT 4:03 PM  03/27/18    [facility] [place], [place], [address] Phone: [phone]   Fax:  [phone]  Name: Mathew Miller. MRN: [id number] Date of Birth: May 07, 1946

## 2018-03-29 ENCOUNTER — Ambulatory Visit: Payer: Medicare HMO | Admitting: Physical Therapy

## 2018-03-29 ENCOUNTER — Encounter: Payer: Self-pay | Admitting: Physical Therapy

## 2018-03-29 VITALS — BP 104/72

## 2018-03-29 DIAGNOSIS — M6281 Muscle weakness (generalized): Secondary | ICD-10-CM

## 2018-03-29 DIAGNOSIS — R2689 Other abnormalities of gait and mobility: Secondary | ICD-10-CM

## 2018-03-29 NOTE — Therapy (Signed)
[facility] [address] [place], [place], [address] Phone: [phone]   Fax:  [phone]  Physical Therapy Treatment  Patient Details  Name: Quintavis Brands. MRN: [id number] Date of Birth: [birth year] Referring Provider: Janann Colonel, NP   Encounter Date: 03/29/2018  PT End of Session - 03/29/18 1547    Visit Number  5    Number of Visits  17    Date for PT Re-Evaluation  05/01/18    Authorization Type  [employer]    PT Start Time  1505    PT Stop Time  1547    PT Time Calculation (min)  42 min    Equipment Utilized During Treatment  Gait belt    Activity Tolerance  Patient tolerated treatment well    Behavior During Therapy  WFL for tasks assessed/performed       Past Medical History:  Diagnosis Date  . Constipation   . Hyperlipidemia   . Hypertension   . Prediabetes    no meds   . Vitamin D deficiency     Past Surgical History:  Procedure Laterality Date  . CATARACT EXTRACTION, BILATERAL  2017  . COLONOSCOPY    . NO PAST SURGERIES      Vitals:   03/29/18 1523  BP: 104/72    Subjective Assessment - 03/29/18 1524    Subjective  Pt states he is very tired today, he did a lot yesterday. BP taken because pt is fatigued. 104/72. Pt states he is drinking lots of water lately as directed by Dr. He has been taken off of blood pressure meds because it was running too low. Pt and wife state that it has been running about 140 in last couple weeks. WIfe and pt notified of BP reading today, discussed continuing to check daily.     Currently in Pain?  No/denies                       [facility] Adult PT Treatment/Exercise - 03/29/18 1511      Ambulation/Gait   Gait Comments  35 ft x6 fwd;  10 ft x2 BWD; 10 ft x4 side stepping;       Exercises   Exercises  Knee/Hip      Knee/Hip Exercises: Standing   Heel Raises  20 reps    Hip Flexion  20 reps;Knee bent    Hip Abduction  Both;2 sets;10 reps    Forward Step Up  --    Stairs  up/down 5 steps with 2 railings, reciprocol pattern x5    Other Standing Knee Exercises  Toe taps on 6 in steps x20;    Other Standing Knee Exercises  Tandem stance 30 sec x 2 bil;       Knee/Hip Exercises: Seated   Long Arc Quad  --    Marching  20 reps    Sit to [employer]  1 set;10 reps               PT Short Term Goals - 03/02/18 1952      PT SHORT TERM GOAL #1   Title  Pt will initiate HEP in order to improve functional mobility.  (Target Date: 04/01/18)    Time  4    Period  Weeks    Status  New    Target Date  04/01/18      PT SHORT TERM GOAL #2   Title  Pt will improve 5TSS time to </=  12 seconds in order to indicate improved strength and decreased fall risk.     Time  4    Period  Weeks    Status  New      PT SHORT TERM GOAL #3   Title  Pt will improve FGA to 20/30 in order to indicate decreased fall risk.      Time  4    Period  Weeks    Status  New      PT SHORT TERM GOAL #4   Title  Will assess 6MWT and improve distance by 150' in order to indicate improved functional endurance.      Time  4    Period  Weeks    Status  New      PT SHORT TERM GOAL #5   Title  Pt will ambulate 1000' over varying outdoor surfaces w/o AD at mod I level in order to indicate improved community negotiation.     Time  4    Period  Weeks    Status  New        PT Long Term Goals - 03/02/18 1954      PT LONG TERM GOAL #1   Title  Pt will be independent with final HEP in order to indicate improved functional mobility.  (Target Date: 05/01/18)    Time  8    Period  Weeks    Status  New    Target Date  05/01/18      PT LONG TERM GOAL #2   Title  Pt will improve FGA to >/=23/30 in order to indicate decreased fall risk.      Time  8    Period  Weeks    Status  New      PT LONG TERM GOAL #3   Title  Pt will improve 6MWT by 300' from baseline in order to indicate improved functional endurance.     Time  8    Period  Weeks    Status  New       PT LONG TERM GOAL #4   Title  Pt will tolerate ambulation x 30 minutes without seated rest break in order to indicate return to leisure walking program.      Time  8    Period  Weeks    Status  New      PT LONG TERM GOAL #5   Title  Pt will verbalize plans for community fitness once therapy has ended to maintain gains made in therapy.     Time  8    Period  Weeks    Status  New            Plan - 03/29/18 1550    Clinical Impression Statement  Pt tired today, requests seated rest breaks between most all exercises. Pt requires max cueing to not look down at his feet during balance exercises, and also requires cuing for safety with hand support on stairs. Pt with improved step height on steps today, with less instance of catching foot on step. Decreased activity done today, due to pt being tired, and more required rest breaks.     PT Treatment/Interventions  ADLs/Self Care Home Management;DME Instruction;Gait training;Stair training;Functional mobility training;Therapeutic activities;Therapeutic exercise;Balance training;Neuromuscular re-education;Patient/family education;Vestibular;Passive range of motion       Patient will benefit from skilled therapeutic intervention in order to improve the following deficits and impairments:  Decreased activity tolerance, Decreased balance, Decreased cognition, Decreased endurance, Decreased strength, Impaired perceived  functional ability, Impaired flexibility, Improper body mechanics  Visit Diagnosis: Other abnormalities of gait and mobility  Muscle weakness (generalized)     Problem List Patient Active Problem List   Diagnosis Date Noted  . Imbalance 02/27/2018  . BMI 24.0-24.9, adult 02/26/2018  . Acid reflux 02/26/2018  . Memory changes 02/26/2018  . Vitamin D deficiency 03/01/2014  . Medication management 03/01/2014  . Hyperlipidemia   . History of prediabetes   . HTN (hypertension) 10/09/2013    Lyndee Hensen, PT, DPT 3:55 PM   03/29/18    [facility] [place], [place], [address] Phone: [phone]   Fax:  [phone]  Name: Wilburn Keir. MRN: [id number] Date of Birth: Mar 07, 1946

## 2018-03-30 DIAGNOSIS — H1131 Conjunctival hemorrhage, right eye: Secondary | ICD-10-CM | POA: Diagnosis not present

## 2018-04-03 ENCOUNTER — Encounter: Payer: Self-pay | Admitting: Internal Medicine

## 2018-04-04 ENCOUNTER — Ambulatory Visit: Payer: Medicare HMO | Admitting: Physical Therapy

## 2018-04-04 ENCOUNTER — Encounter: Payer: Self-pay | Admitting: Physical Therapy

## 2018-04-04 DIAGNOSIS — M6281 Muscle weakness (generalized): Secondary | ICD-10-CM

## 2018-04-04 DIAGNOSIS — R2689 Other abnormalities of gait and mobility: Secondary | ICD-10-CM | POA: Diagnosis not present

## 2018-04-04 NOTE — Therapy (Signed)
[facility] [address] [place], [place], [address] Phone: [phone]   Fax:  [phone]  Physical Therapy Treatment  Patient Details  Name: Mathew Miller. MRN: [id number] Date of Birth: 10-04-1946 Referring Provider: Janann Colonel, NP   Encounter Date: 04/04/2018  PT End of Session - 04/04/18 0934    Visit Number  6    Number of Visits  17    Date for PT Re-Evaluation  05/01/18    Authorization Type  [employer]    PT Start Time  0925    PT Stop Time  1021    PT Time Calculation (min)  56 min    Equipment Utilized During Treatment  Gait belt    Activity Tolerance  Patient tolerated treatment well    Behavior During Therapy  WFL for tasks assessed/performed       Past Medical History:  Diagnosis Date  . Constipation   . Hyperlipidemia   . Hypertension   . Prediabetes    no meds   . Vitamin D deficiency     Past Surgical History:  Procedure Laterality Date  . CATARACT EXTRACTION, BILATERAL  2017  . COLONOSCOPY    . NO PAST SURGERIES      There were no vitals filed for this visit.  Subjective Assessment - 04/04/18 0930    Subjective  Pt states he is on "new medication". Notes read from telephone call with Dr. Maudie Mercury wife reported pt's BP readings... MD advised to take BP pill, only if reading was high. It does not appear that pt is on any new meds. Pt states he has been walking at home, has not been doing HEP as often.                        [facility] Adult PT Treatment/Exercise - 04/04/18 0921      Ambulation/Gait   Gait Comments  35 ft x8 fwd;  10 ft x4 side stepping; 35 ft x4 stepping over cones;       Exercises   Exercises  Knee/Hip      Knee/Hip Exercises: Aerobic   Stationary Bike  L1 x 8 min      Knee/Hip Exercises: Standing   Heel Raises  20 reps    Hip Flexion  20 reps;Knee bent    Hip Abduction  Both;2 sets;10 reps    Forward Step Up  10 reps;Both    Stairs  up/down 5 steps  with 2 railings, reciprocol pattern x5    Other Standing Knee Exercises  Toe taps on 6 in steps x20;    Other Standing Knee Exercises  --      Knee/Hip Exercises: Seated   Long Arc Quad  2 sets;10 reps;Both    Marching  --    Sit to [employer]  1 set;10 reps             PT Education - 04/04/18 0934    Education provided  Yes    Education Details  Encouraged HEP, stated this to wife and pt.     Person(s) Educated  Patient    Methods  Explanation    Comprehension  Verbalized understanding       PT Short Term Goals - 03/02/18 1952      PT SHORT TERM GOAL #1   Title  Pt will initiate HEP in order to improve functional mobility.  (Target Date: 04/01/18)    Time  4  Period  Weeks    Status  New    Target Date  04/01/18      PT SHORT TERM GOAL #2   Title  Pt will improve 5TSS time to </=12 seconds in order to indicate improved strength and decreased fall risk.     Time  4    Period  Weeks    Status  New      PT SHORT TERM GOAL #3   Title  Pt will improve FGA to 20/30 in order to indicate decreased fall risk.      Time  4    Period  Weeks    Status  New      PT SHORT TERM GOAL #4   Title  Will assess 6MWT and improve distance by 150' in order to indicate improved functional endurance.      Time  4    Period  Weeks    Status  New      PT SHORT TERM GOAL #5   Title  Pt will ambulate 1000' over varying outdoor surfaces w/o AD at mod I level in order to indicate improved community negotiation.     Time  4    Period  Weeks    Status  New        PT Long Term Goals - 03/02/18 1954      PT LONG TERM GOAL #1   Title  Pt will be independent with final HEP in order to indicate improved functional mobility.  (Target Date: 05/01/18)    Time  8    Period  Weeks    Status  New    Target Date  05/01/18      PT LONG TERM GOAL #2   Title  Pt will improve FGA to >/=23/30 in order to indicate decreased fall risk.      Time  8    Period  Weeks    Status  New      PT LONG  TERM GOAL #3   Title  Pt will improve 6MWT by 300' from baseline in order to indicate improved functional endurance.     Time  8    Period  Weeks    Status  New      PT LONG TERM GOAL #4   Title  Pt will tolerate ambulation x 30 minutes without seated rest break in order to indicate return to leisure walking program.      Time  8    Period  Weeks    Status  New      PT LONG TERM GOAL #5   Title  Pt will verbalize plans for community fitness once therapy has ended to maintain gains made in therapy.     Time  8    Period  Weeks    Status  New            Plan - 04/04/18 1016    Clinical Impression Statement  Pt unable to follow most commands for safety cues today. Pt unable to clear foot on stair (stepping down) and has contact with toes on step almost each try. Pt cued with max (verbal) cueing with ambulation next to cones; pt unable to maintain safe spacing away from cones when walking, and walks very close to it, although cued not to do so. Balance training limited by pts ability to follow commands and understand safety cues.     PT Treatment/Interventions  ADLs/Self Care Home Management;DME Instruction;Gait training;Stair training;Functional  mobility training;Therapeutic activities;Therapeutic exercise;Balance training;Neuromuscular re-education;Patient/family education;Vestibular;Passive range of motion       Patient will benefit from skilled therapeutic intervention in order to improve the following deficits and impairments:  Decreased activity tolerance, Decreased balance, Decreased cognition, Decreased endurance, Decreased strength, Impaired perceived functional ability, Impaired flexibility, Improper body mechanics  Visit Diagnosis: Other abnormalities of gait and mobility  Muscle weakness (generalized)     Problem List Patient Active Problem List   Diagnosis Date Noted  . Imbalance 02/27/2018  . BMI 24.0-24.9, adult 02/26/2018  . Acid reflux 02/26/2018  . Memory  changes 02/26/2018  . Vitamin D deficiency 03/01/2014  . Medication management 03/01/2014  . Hyperlipidemia   . History of prediabetes   . HTN (hypertension) 10/09/2013    Lyndee Hensen, PT, DPT 11:19 AM  04/04/18    [facility] [address] [place], [place], [address] Phone: [phone]   Fax:  [phone]  Name: Chananya Canizalez. MRN: [id number] Date of Birth: 01-16-1946

## 2018-04-06 ENCOUNTER — Encounter: Payer: Medicare HMO | Admitting: Physical Therapy

## 2018-04-10 ENCOUNTER — Encounter: Payer: Self-pay | Admitting: Physical Therapy

## 2018-04-10 ENCOUNTER — Ambulatory Visit: Payer: Medicare HMO | Admitting: Physical Therapy

## 2018-04-10 DIAGNOSIS — M6281 Muscle weakness (generalized): Secondary | ICD-10-CM | POA: Diagnosis not present

## 2018-04-10 DIAGNOSIS — R2689 Other abnormalities of gait and mobility: Secondary | ICD-10-CM

## 2018-04-10 NOTE — Therapy (Signed)
[facility] [address] [place], [place], [address] Phone: [phone]   Fax:  [phone]  Physical Therapy Treatment  Patient Details  Name: Mathew Miller. MRN: [id number] Date of Birth: 06-29-1946 Referring Provider: Janann Colonel, NP   Encounter Date: 04/10/2018  PT End of Session - 04/10/18 1510    Visit Number  7    Number of Visits  17    Date for PT Re-Evaluation  05/01/18    Authorization Type  [employer]    PT Start Time  1430    PT Stop Time  1513    PT Time Calculation (min)  43 min    Equipment Utilized During Treatment  Gait belt    Activity Tolerance  Patient tolerated treatment well    Behavior During Therapy  WFL for tasks assessed/performed       Past Medical History:  Diagnosis Date  . Constipation   . Hyperlipidemia   . Hypertension   . Prediabetes    no meds   . Vitamin D deficiency     Past Surgical History:  Procedure Laterality Date  . CATARACT EXTRACTION, BILATERAL  2017  . COLONOSCOPY    . NO PAST SURGERIES      There were no vitals filed for this visit.  Subjective Assessment - 04/10/18 1509    Subjective  Pt states he was "sick" last week for a couple days, states BP was fine, and he just didnt feel good from the hot weather.     Currently in Pain?  No/denies                       [facility] Adult PT Treatment/Exercise - 04/10/18 1440      Ambulation/Gait   Gait Comments  35 ft x8 fwd with speed changes and head turns L/R ;  10 ft x4 side stepping; 10 ft x4 bwd walking;       Exercises   Exercises  Knee/Hip      Knee/Hip Exercises: Aerobic   Stationary Bike  L1 x 8 min      Knee/Hip Exercises: Standing   Heel Raises  20 reps    Hip Flexion  20 reps;Knee bent    Hip Abduction  Both;2 sets;10 reps    Forward Step Up  10 reps;Both    Stairs  up/down 5 steps with 2 railings, reciprocol pattern x5    Other Standing Knee Exercises  Toe taps on 6 in steps x20;       Knee/Hip Exercises: Seated   Long Arc Quad  2 sets;10 reps;Both    Sit to [employer]  1 set;10 reps             PT Education - 04/10/18 1511    Education provided  Yes    Education Details  Encouraged HEP and using stationary bike at home.     Person(s) Educated  Patient    Methods  Explanation    Comprehension  Verbalized understanding       PT Short Term Goals - 03/02/18 1952      PT SHORT TERM GOAL #1   Title  Pt will initiate HEP in order to improve functional mobility.  (Target Date: 04/01/18)    Time  4    Period  Weeks    Status  New    Target Date  04/01/18      PT SHORT TERM GOAL #2   Title  Pt will improve 5TSS time to </=12 seconds in order to indicate improved strength and decreased fall risk.     Time  4    Period  Weeks    Status  New      PT SHORT TERM GOAL #3   Title  Pt will improve FGA to 20/30 in order to indicate decreased fall risk.      Time  4    Period  Weeks    Status  New      PT SHORT TERM GOAL #4   Title  Will assess 6MWT and improve distance by 150' in order to indicate improved functional endurance.      Time  4    Period  Weeks    Status  New      PT SHORT TERM GOAL #5   Title  Pt will ambulate 1000' over varying outdoor surfaces w/o AD at mod I level in order to indicate improved community negotiation.     Time  4    Period  Weeks    Status  New        PT Long Term Goals - 03/02/18 1954      PT LONG TERM GOAL #1   Title  Pt will be independent with final HEP in order to indicate improved functional mobility.  (Target Date: 05/01/18)    Time  8    Period  Weeks    Status  New    Target Date  05/01/18      PT LONG TERM GOAL #2   Title  Pt will improve FGA to >/=23/30 in order to indicate decreased fall risk.      Time  8    Period  Weeks    Status  New      PT LONG TERM GOAL #3   Title  Pt will improve 6MWT by 300' from baseline in order to indicate improved functional endurance.     Time  8    Period  Weeks     Status  New      PT LONG TERM GOAL #4   Title  Pt will tolerate ambulation x 30 minutes without seated rest break in order to indicate return to leisure walking program.      Time  8    Period  Weeks    Status  New      PT LONG TERM GOAL #5   Title  Pt will verbalize plans for community fitness once therapy has ended to maintain gains made in therapy.     Time  8    Period  Weeks    Status  New            Plan - 04/10/18 1511    Clinical Impression Statement  Pt able to follow commands and perform gait training today, with head turns and speed changes, no LOB with this. He is continuously cued for increasing step height with all standing gait and stair activities. Pt with less instance of catching toes today on step, but still occured several times. Pt easily fatigued with ther ex, and requests seated rest breaks, but was able to stand and rest today  2 x between exercises. Plan to progress gait , strength and balance as tolerated.     PT Treatment/Interventions  ADLs/Self Care Home Management;DME Instruction;Gait training;Stair training;Functional mobility training;Therapeutic activities;Therapeutic exercise;Balance training;Neuromuscular re-education;Patient/family education;Vestibular;Passive range of motion       Patient will benefit from skilled therapeutic  intervention in order to improve the following deficits and impairments:  Decreased activity tolerance, Decreased balance, Decreased cognition, Decreased endurance, Decreased strength, Impaired perceived functional ability, Impaired flexibility, Improper body mechanics  Visit Diagnosis: Other abnormalities of gait and mobility  Muscle weakness (generalized)     Problem List Patient Active Problem List   Diagnosis Date Noted  . Imbalance 02/27/2018  . BMI 24.0-24.9, adult 02/26/2018  . Acid reflux 02/26/2018  . Memory changes 02/26/2018  . Vitamin D deficiency 03/01/2014  . Medication management 03/01/2014  .  Hyperlipidemia   . History of prediabetes   . HTN (hypertension) 10/09/2013    Lyndee Hensen, PT, DPT 3:23 PM  04/10/18    [facility] [place], [place], [address] Phone: [phone]   Fax:  [phone]  Name: Mathew Miller. MRN: [id number] Date of Birth: 08-14-1946

## 2018-04-13 ENCOUNTER — Ambulatory Visit (INDEPENDENT_AMBULATORY_CARE_PROVIDER_SITE_OTHER): Payer: Medicare HMO | Admitting: Internal Medicine

## 2018-04-13 ENCOUNTER — Encounter: Payer: Self-pay | Admitting: Physical Therapy

## 2018-04-13 ENCOUNTER — Encounter: Payer: Self-pay | Admitting: Internal Medicine

## 2018-04-13 ENCOUNTER — Ambulatory Visit: Payer: Medicare HMO | Admitting: Physical Therapy

## 2018-04-13 VITALS — BP 118/66 | HR 85 | Temp 97.7°F | Resp 12 | Ht 74.0 in | Wt 190.0 lb

## 2018-04-13 DIAGNOSIS — B379 Candidiasis, unspecified: Secondary | ICD-10-CM | POA: Diagnosis not present

## 2018-04-13 DIAGNOSIS — R2689 Other abnormalities of gait and mobility: Secondary | ICD-10-CM | POA: Diagnosis not present

## 2018-04-13 DIAGNOSIS — M6281 Muscle weakness (generalized): Secondary | ICD-10-CM

## 2018-04-13 MED ORDER — FLUCONAZOLE 150 MG PO TABS: ORAL_TABLET | ORAL | 1 refills | Status: DC

## 2018-04-13 NOTE — Therapy (Signed)
[facility] [address] [place], [place], [address] Phone: [phone]   Fax:  [phone]  Physical Therapy Treatment  Patient Details  Name: Mathew Miller. MRN: [id number] Date of Birth: 02-17-1946 Referring Provider: Janann Colonel, NP   Encounter Date: 04/13/2018  PT End of Session - 04/13/18 1422    Visit Number  8    Number of Visits  17    Date for PT Re-Evaluation  05/01/18    Authorization Type  [employer]    PT Start Time  6568    PT Stop Time  1507    PT Time Calculation (min)  45 min    Equipment Utilized During Treatment  Gait belt    Activity Tolerance  Patient tolerated treatment well    Behavior During Therapy  WFL for tasks assessed/performed       Past Medical History:  Diagnosis Date  . Constipation   . Hyperlipidemia   . Hypertension   . Prediabetes    no meds   . Vitamin D deficiency     Past Surgical History:  Procedure Laterality Date  . CATARACT EXTRACTION, BILATERAL  2017  . COLONOSCOPY    . NO PAST SURGERIES      There were no vitals filed for this visit.  Subjective Assessment - 04/13/18 1428    Subjective  Pt states he is not doing exercises at home.     Currently in Pain?  No/denies         [facility] PT Assessment - 04/13/18 0001      Transfers   Five time sit to stand comments   16.69 sec                   [facility] Adult PT Treatment/Exercise - 04/13/18 0001      Ambulation/Gait   Gait Comments  35 ft x8; 174f x1 with direction changes in hallway; 142fx4 each, side stepping and bwd walking;  Dynamic walking, 35 ft x4 with head turns; x4 with weaving around cones; x2 stepping over cones; x1 picking up cones/squatting;        Knee/Hip Exercises: Standing   Heel Raises  20 reps    Hip Flexion  20 reps;Knee bent    Hip Abduction  Both;2 sets;10 reps    Stairs  up/down 5 steps with 2 railings, reciprocol pattern x5    Other Standing Knee Exercises  Toe taps on  6 in steps x30, x15 with 1 Ue support, x15 with no UE support.       Knee/Hip Exercises: Seated   Long Arc Quad  2 sets;10 reps;Both    Marching  20 reps    Sit to [employer] sets;10 reps             PT Education - 04/13/18 1429    Education provided  Yes    Education Details  Encouraged HEP, discussed safety in bathroom, and in bedroom, to sit down while washing, drying, and dressing.     Person(s) Educated  Patient    Methods  Explanation    Comprehension  Verbalized understanding       PT Short Term Goals - 04/13/18 1508      PT SHORT TERM GOAL #1   Title  Pt will initiate HEP in order to improve functional mobility.  (Target Date: 04/01/18)    Time  4    Period  Weeks    Status  Partially Met  PT SHORT TERM GOAL #2   Title  Pt will improve 5TSS time to </=12 seconds in order to indicate improved strength and decreased fall risk.     Time  4    Period  Weeks    Status  On-going      PT SHORT TERM GOAL #3   Title  Pt will improve FGA to 20/30 in order to indicate decreased fall risk.      Time  4    Period  Weeks    Status  On-going      PT SHORT TERM GOAL #4   Title  Will assess 6MWT and improve distance by 150' in order to indicate improved functional endurance.      Time  4    Period  Weeks    Status  New      PT SHORT TERM GOAL #5   Title  Pt will ambulate 1000' over varying outdoor surfaces w/o AD at mod I level in order to indicate improved community negotiation.     Time  4    Period  Weeks    Status  New        PT Long Term Goals - 03/02/18 1954      PT LONG TERM GOAL #1   Title  Pt will be independent with final HEP in order to indicate improved functional mobility.  (Target Date: 05/01/18)    Time  8    Period  Weeks    Status  New    Target Date  05/01/18      PT LONG TERM GOAL #2   Title  Pt will improve FGA to >/=23/30 in order to indicate decreased fall risk.      Time  8    Period  Weeks    Status  New      PT LONG TERM GOAL #3    Title  Pt will improve 6MWT by 300' from baseline in order to indicate improved functional endurance.     Time  8    Period  Weeks    Status  New      PT LONG TERM GOAL #4   Title  Pt will tolerate ambulation x 30 minutes without seated rest break in order to indicate return to leisure walking program.      Time  8    Period  Weeks    Status  New      PT LONG TERM GOAL #5   Title  Pt will verbalize plans for community fitness once therapy has ended to maintain gains made in therapy.     Time  8    Period  Weeks    Status  New            Plan - 04/13/18 1510    Clinical Impression Statement  Pt with improved energy and ability to follow commands today. Pt able to perform dynamic gait with mild decrease in speed, and minimal cueing. Pt demonstrates safe ability for direction changes ,but states decreased confidence with this at home. Plan to progress balance as tolerated.     PT Treatment/Interventions  ADLs/Self Care Home Management;DME Instruction;Gait training;Stair training;Functional mobility training;Therapeutic activities;Therapeutic exercise;Balance training;Neuromuscular re-education;Patient/family education;Vestibular;Passive range of motion       Patient will benefit from skilled therapeutic intervention in order to improve the following deficits and impairments:  Decreased activity tolerance, Decreased balance, Decreased cognition, Decreased endurance, Decreased strength, Impaired perceived functional ability, Impaired flexibility, Improper body  mechanics  Visit Diagnosis: Other abnormalities of gait and mobility  Muscle weakness (generalized)     Problem List Patient Active Problem List   Diagnosis Date Noted  . Imbalance 02/27/2018  . BMI 24.0-24.9, adult 02/26/2018  . Acid reflux 02/26/2018  . Memory changes 02/26/2018  . Vitamin D deficiency 03/01/2014  . Medication management 03/01/2014  . Hyperlipidemia   . History of prediabetes   . HTN  (hypertension) 10/09/2013    Lyndee Hensen, PT, DPT 3:13 PM  04/13/18    [facility] [place], [place], [address] Phone: [phone]   Fax:  [phone]  Name: Mathew Miller. MRN: [id number] Date of Birth: January 17, 1946

## 2018-04-13 NOTE — Progress Notes (Signed)
  Subjective:    Patient ID: Mathew Phi., male    DOB: 1946/10/09, 72 y.o.   MRN: [id number]  HPI    Patient is a nice 72 yo [name] presenting with concern of a pruritic  rash of the proximal Rt thigh that he has been applying Triamcinolone cream for 1-2 weeks.  Medication Sig  . VITAMIN D3  Take 2,500 Units by mouth daily. Take 1/2 tablet daily  . VIT B-12 2500 MCG SUBL  Dissolve 1 tablet under tongue daily)  . Magnesium 250 MG TABS Take 250 mg by mouth daily.  . NONFORMULARY  Prostaglandin EL. 0.4 mm/20 mg as needed injected into the penis  . omeprazole20 MG capsule TAKE 1 CAPSULE TWICE DAILY  . PAZEO 0.7 % SOLN   . Polyethylene Glycol  Take 17 g/day  daily.  . pravastatin (PRAVACHOL) 40 MG tablet TAKE 1 TABLET EVERY EVENING  . promethazine (PHENERGAN) 25 MG tablet Takes 1 tablet every 4 hours as needed for nausea or vomititng.  . triamcinolone cream (KENALOG) 0.1 % Apply 1 application topically 3 (three) times daily. Apply sparingly 2 to 3 x day    Allergies  Allergen Reactions  . Hydrocodone-Acetaminophen Nausea Only  . Sudafed [Pseudoephedrine Hcl]     Urinary retention  . Tetanus Toxoids     Itching, burning feeling, ran a fever   Past Medical History:  Diagnosis Date  . Constipation   . Hyperlipidemia   . Hypertension   . Prediabetes    no meds   . Vitamin D deficiency    Review of Systems    10 point systems review negative except as above.     Objective:   Physical Exam  BP 118/66   Pulse 85   Temp 97.7 F (36.5 C)   Resp 12   Ht 6\' 2"  (1.88 m)   Wt 190 lb (86.2 kg)   SpO2 97%   BMI 24.39 kg/m   Exam focused to the Rt proximal groin finds an excoriate velvety confluent rash appearing consistent with a fungal rash covering an area of about 2-3" x 5'6". No signs of cellulitis/lymphangitis.     Assessment & Plan:   1. Candidiasis vs Tinea   - fluconazole (DIFLUCAN) 150 MG tablet; Take 1 tablet 2 x/weekly (Mon & Thurs) for skin fungus  Dispense: 8  tablet; Refill: 1  - advised also try using an OTC anti-fungal cream or Lotrimin cream

## 2018-04-17 ENCOUNTER — Encounter: Payer: Self-pay | Admitting: Physical Therapy

## 2018-04-17 ENCOUNTER — Encounter: Payer: Self-pay | Admitting: Internal Medicine

## 2018-04-17 ENCOUNTER — Ambulatory Visit: Payer: Medicare HMO | Admitting: Physical Therapy

## 2018-04-17 ENCOUNTER — Other Ambulatory Visit: Payer: Self-pay

## 2018-04-17 DIAGNOSIS — L259 Unspecified contact dermatitis, unspecified cause: Secondary | ICD-10-CM

## 2018-04-17 DIAGNOSIS — R2689 Other abnormalities of gait and mobility: Secondary | ICD-10-CM

## 2018-04-17 DIAGNOSIS — M6281 Muscle weakness (generalized): Secondary | ICD-10-CM

## 2018-04-17 MED ORDER — TRIAMCINOLONE ACETONIDE 0.1 % EX CREA: 1.0000 "application " | TOPICAL_CREAM | Freq: Three times a day (TID) | CUTANEOUS | 3 refills | Status: DC

## 2018-04-17 NOTE — Therapy (Signed)
[facility] [address] [place], [place], [address] Phone: [phone]   Fax:  [phone]  Physical Therapy Treatment  Patient Details  Name: Mathew Miller. MRN: [id number] Date of Birth: [birth year] Referring Provider: Janann Colonel, NP   Encounter Date: 04/17/2018  PT End of Session - 04/17/18 1404    Visit Number  9    Number of Visits  17    Date for PT Re-Evaluation  05/01/18    Authorization Type  [employer]    PT Start Time  1350    PT Stop Time  1438    PT Time Calculation (min)  48 min    Equipment Utilized During Treatment  Gait belt    Activity Tolerance  Patient tolerated treatment well    Behavior During Therapy  WFL for tasks assessed/performed       Past Medical History:  Diagnosis Date  . Constipation   . Hyperlipidemia   . Hypertension   . Prediabetes    no meds   . Vitamin D deficiency     Past Surgical History:  Procedure Laterality Date  . CATARACT EXTRACTION, BILATERAL  2017  . COLONOSCOPY    . NO PAST SURGERIES      There were no vitals filed for this visit.  Subjective Assessment - 04/17/18 1359    Subjective  Pt with no new complaints about balance. He did do HEP over the weekend.                        [facility] Adult PT Treatment/Exercise - 04/17/18 1406      Ambulation/Gait   Gait Comments  35 ft x33fd; x4 with direction changes;  x4 with head turns; 165fx4  side stepping       Knee/Hip Exercises: Aerobic   Stationary Bike  L1 x6  min      Knee/Hip Exercises: Standing   Heel Raises  20 reps    Hip Flexion  20 reps;Knee bent    Hip Abduction  Both;2 sets;10 reps    Stairs  up/down 5 steps with 2 railings, reciprocol pattern x4    Other Standing Knee Exercises  Toe taps on 6 in steps x30, 1 UE support      Knee/Hip Exercises: Seated   Long Arc Quad  2 sets;10 reps;Both    Marching  20 reps    Sit to [employer] sets;10 reps             PT Education -  04/17/18 1434    Education provided  Yes    Education Details  Home safety, shower safety,dressing safety    Person(s) Educated  Patient    Methods  Explanation    Comprehension  Verbalized understanding;Need further instruction       PT Short Term Goals - 04/13/18 1508      PT SHORT TERM GOAL #1   Title  Pt will initiate HEP in order to improve functional mobility.  (Target Date: 04/01/18)    Time  4    Period  Weeks    Status  Partially Met      PT SHORT TERM GOAL #2   Title  Pt will improve 5TSS time to </=12 seconds in order to indicate improved strength and decreased fall risk.     Time  4    Period  Weeks    Status  On-going      PT  SHORT TERM GOAL #3   Title  Pt will improve FGA to 20/30 in order to indicate decreased fall risk.      Time  4    Period  Weeks    Status  On-going      PT SHORT TERM GOAL #4   Title  Will assess 6MWT and improve distance by 150' in order to indicate improved functional endurance.      Time  4    Period  Weeks    Status  New      PT SHORT TERM GOAL #5   Title  Pt will ambulate 1000' over varying outdoor surfaces w/o AD at mod I level in order to indicate improved community negotiation.     Time  4    Period  Weeks    Status  New        PT Long Term Goals - 03/02/18 1954      PT LONG TERM GOAL #1   Title  Pt will be independent with final HEP in order to indicate improved functional mobility.  (Target Date: 05/01/18)    Time  8    Period  Weeks    Status  New    Target Date  05/01/18      PT LONG TERM GOAL #2   Title  Pt will improve FGA to >/=23/30 in order to indicate decreased fall risk.      Time  8    Period  Weeks    Status  New      PT LONG TERM GOAL #3   Title  Pt will improve 6MWT by 300' from baseline in order to indicate improved functional endurance.     Time  8    Period  Weeks    Status  New      PT LONG TERM GOAL #4   Title  Pt will tolerate ambulation x 30 minutes without seated rest break in order to  indicate return to leisure walking program.      Time  8    Period  Weeks    Status  New      PT LONG TERM GOAL #5   Title  Pt will verbalize plans for community fitness once therapy has ended to maintain gains made in therapy.     Time  8    Period  Weeks    Status  New            Plan - 04/17/18 1455    Clinical Impression Statement  Pt required max cuing today to look up/ look ahead, and not look at feet during ther ex, and gait training. Pt with improving endurance levels, requiring less seated rest breaks, but pt needs to be encouraged not to rest in bewtween each exercise if able. Pt with improving ability for head turns and direction changes with gait.     PT Treatment/Interventions  ADLs/Self Care Home Management;DME Instruction;Gait training;Stair training;Functional mobility training;Therapeutic activities;Therapeutic exercise;Balance training;Neuromuscular re-education;Patient/family education;Vestibular;Passive range of motion       Patient will benefit from skilled therapeutic intervention in order to improve the following deficits and impairments:  Decreased activity tolerance, Decreased balance, Decreased cognition, Decreased endurance, Decreased strength, Impaired perceived functional ability, Impaired flexibility, Improper body mechanics  Visit Diagnosis: Other abnormalities of gait and mobility  Muscle weakness (generalized)     Problem List Patient Active Problem List   Diagnosis Date Noted  . Imbalance 02/27/2018  . BMI 24.0-24.9, adult 02/26/2018  .  Acid reflux 02/26/2018  . Memory changes 02/26/2018  . Vitamin D deficiency 03/01/2014  . Medication management 03/01/2014  . Hyperlipidemia   . History of prediabetes   . HTN (hypertension) 10/09/2013    Lyndee Hensen, PT, DPT 2:59 PM  04/17/18    [facility] [place], [place], [address] Phone: [phone]   Fax:  [phone]  Name:  Mathew Miller. MRN: [id number] Date of Birth: 03/07/1946

## 2018-04-18 ENCOUNTER — Ambulatory Visit: Payer: Medicare HMO | Admitting: Neurology

## 2018-04-18 ENCOUNTER — Other Ambulatory Visit: Payer: Self-pay | Admitting: Internal Medicine

## 2018-04-18 DIAGNOSIS — L259 Unspecified contact dermatitis, unspecified cause: Secondary | ICD-10-CM

## 2018-04-18 MED ORDER — TRIAMCINOLONE ACETONIDE 0.1 % EX CREA: TOPICAL_CREAM | CUTANEOUS | 3 refills | Status: DC

## 2018-04-19 ENCOUNTER — Encounter: Payer: Self-pay | Admitting: Physical Therapy

## 2018-04-19 ENCOUNTER — Ambulatory Visit: Payer: Medicare HMO | Admitting: Physical Therapy

## 2018-04-19 DIAGNOSIS — M6281 Muscle weakness (generalized): Secondary | ICD-10-CM

## 2018-04-19 DIAGNOSIS — R2689 Other abnormalities of gait and mobility: Secondary | ICD-10-CM | POA: Diagnosis not present

## 2018-04-19 NOTE — Therapy (Signed)
[facility] [address] [place], [place], [address] Phone: [phone]   Fax:  [phone]  Physical Therapy Treatment  Patient Details  Name: Mathew Miller. MRN: [id number] Date of Birth: 1946/08/15 Referring Provider: Janann Colonel, NP   Encounter Date: 04/19/2018  PT End of Session - 04/19/18 1415    Visit Number  10    Number of Visits  17    Date for PT Re-Evaluation  05/01/18    Authorization Type  [employer]    PT Start Time  1347    PT Stop Time  1430    PT Time Calculation (min)  43 min    Equipment Utilized During Treatment  Gait belt    Activity Tolerance  Patient tolerated treatment well    Behavior During Therapy  WFL for tasks assessed/performed       Past Medical History:  Diagnosis Date  . Constipation   . Hyperlipidemia   . Hypertension   . Prediabetes    no meds   . Vitamin D deficiency     Past Surgical History:  Procedure Laterality Date  . CATARACT EXTRACTION, BILATERAL  2017  . COLONOSCOPY    . NO PAST SURGERIES      There were no vitals filed for this visit.  Subjective Assessment - 04/19/18 1355    Subjective  Pt states he feels "slow because of the heat" today. He is unable to pin point if he doesnt feel good. Denies shortness of breath, dizziness.                        [facility] Adult PT Treatment/Exercise - 04/19/18 1357      Ambulation/Gait   Gait Comments  4 min walk;  35 ft x4 with head turns; 35 ft bwd;       Knee/Hip Exercises: Aerobic   Stationary Bike  --      Knee/Hip Exercises: Standing   Heel Raises  20 reps    Hip Flexion  20 reps;Knee bent    Hip Abduction  Both;2 sets;10 reps    Forward Step Up  10 reps;Both    Stairs  --    Other Standing Knee Exercises  Toe taps on 6 in steps x30, 1 UE support    Other Standing Knee Exercises  Tandem stance 30 sec x2 bil, no UE support;       Knee/Hip Exercises: Seated   Long Arc Quad  2 sets;10 reps;Both     Marching  --    Sit to [employer]  10 reps;1 set               PT Short Term Goals - 04/13/18 1508      PT SHORT TERM GOAL #1   Title  Pt will initiate HEP in order to improve functional mobility.  (Target Date: 04/01/18)    Time  4    Period  Weeks    Status  Partially Met      PT SHORT TERM GOAL #2   Title  Pt will improve 5TSS time to </=12 seconds in order to indicate improved strength and decreased fall risk.     Time  4    Period  Weeks    Status  On-going      PT SHORT TERM GOAL #3   Title  Pt will improve FGA to 20/30 in order to indicate decreased fall risk.  Time  4    Period  Weeks    Status  On-going      PT SHORT TERM GOAL #4   Title  Will assess 6MWT and improve distance by 150' in order to indicate improved functional endurance.      Time  4    Period  Weeks    Status  New      PT SHORT TERM GOAL #5   Title  Pt will ambulate 1000' over varying outdoor surfaces w/o AD at mod I level in order to indicate improved community negotiation.     Time  4    Period  Weeks    Status  New        PT Long Term Goals - 03/02/18 1954      PT LONG TERM GOAL #1   Title  Pt will be independent with final HEP in order to indicate improved functional mobility.  (Target Date: 05/01/18)    Time  8    Period  Weeks    Status  New    Target Date  05/01/18      PT LONG TERM GOAL #2   Title  Pt will improve FGA to >/=23/30 in order to indicate decreased fall risk.      Time  8    Period  Weeks    Status  New      PT LONG TERM GOAL #3   Title  Pt will improve 6MWT by 300' from baseline in order to indicate improved functional endurance.     Time  8    Period  Weeks    Status  New      PT LONG TERM GOAL #4   Title  Pt will tolerate ambulation x 30 minutes without seated rest break in order to indicate return to leisure walking program.      Time  8    Period  Weeks    Status  New      PT LONG TERM GOAL #5   Title  Pt will verbalize plans for community  fitness once therapy has ended to maintain gains made in therapy.     Time  8    Period  Weeks    Status  New            Plan - 04/19/18 1621    Clinical Impression Statement  Pt did not require more than normal amount of rest breaks today, although he states he was "tired"when he came in. Pt has been unable to progress balance activities too much, due to continuting to be challenged wtih current level of activity and ther ex. Pt continues to require safety cueing on stairs, and requires max cueing to perform LE exercises with correct form. Plan to re-eval next week.     PT Treatment/Interventions  ADLs/Self Care Home Management;DME Instruction;Gait training;Stair training;Functional mobility training;Therapeutic activities;Therapeutic exercise;Balance training;Neuromuscular re-education;Patient/family education;Vestibular;Passive range of motion       Patient will benefit from skilled therapeutic intervention in order to improve the following deficits and impairments:  Decreased activity tolerance, Decreased balance, Decreased cognition, Decreased endurance, Decreased strength, Impaired perceived functional ability, Impaired flexibility, Improper body mechanics  Visit Diagnosis: Other abnormalities of gait and mobility  Muscle weakness (generalized)     Problem List Patient Active Problem List   Diagnosis Date Noted  . Imbalance 02/27/2018  . BMI 24.0-24.9, adult 02/26/2018  . Acid reflux 02/26/2018  . Memory changes 02/26/2018  . Vitamin D deficiency  03/01/2014  . Medication management 03/01/2014  . Hyperlipidemia   . History of prediabetes   . HTN (hypertension) 10/09/2013    Lyndee Hensen, PT, DPT 4:23 PM  04/19/18    [facility] [place], [place], [address] Phone: [phone]   Fax:  [phone]  Name: Mathew Miller. MRN: [id number] Date of Birth: 1946/05/03

## 2018-04-24 ENCOUNTER — Other Ambulatory Visit: Payer: Self-pay | Admitting: *Deleted

## 2018-04-24 ENCOUNTER — Encounter: Payer: Self-pay | Admitting: Internal Medicine

## 2018-04-24 ENCOUNTER — Encounter: Payer: Medicare HMO | Admitting: Physical Therapy

## 2018-04-24 DIAGNOSIS — L259 Unspecified contact dermatitis, unspecified cause: Secondary | ICD-10-CM

## 2018-04-24 MED ORDER — TRIAMCINOLONE ACETONIDE 0.1 % EX CREA: TOPICAL_CREAM | CUTANEOUS | 3 refills | Status: DC

## 2018-04-25 ENCOUNTER — Encounter: Payer: Medicare HMO | Admitting: Physical Therapy

## 2018-04-26 ENCOUNTER — Ambulatory Visit: Payer: Medicare HMO | Admitting: Physical Therapy

## 2018-04-26 DIAGNOSIS — M6281 Muscle weakness (generalized): Secondary | ICD-10-CM

## 2018-04-26 DIAGNOSIS — R2689 Other abnormalities of gait and mobility: Secondary | ICD-10-CM

## 2018-04-27 ENCOUNTER — Encounter: Payer: Self-pay | Admitting: Physical Therapy

## 2018-04-27 NOTE — Therapy (Signed)
[facility] [address] [place], [place], [address] Phone: [phone]   Fax:  [phone]  Physical Therapy Treatment  Patient Details  Name: Mathew Miller. MRN: [id number] Date of Birth: 05-20-1946 Referring Provider: Janann Colonel, NP   Encounter Date: 04/26/2018  PT End of Session - 04/27/18 1159    Visit Number  11    Number of Visits  17    Date for PT Re-Evaluation  05/01/18    Authorization Type  [employer]    PT Start Time  1432    PT Stop Time  1514    PT Time Calculation (min)  42 min    Equipment Utilized During Treatment  Gait belt    Activity Tolerance  Patient tolerated treatment well    Behavior During Therapy  WFL for tasks assessed/performed       Past Medical History:  Diagnosis Date  . Constipation   . Hyperlipidemia   . Hypertension   . Prediabetes    no meds   . Vitamin D deficiency     Past Surgical History:  Procedure Laterality Date  . CATARACT EXTRACTION, BILATERAL  2017  . COLONOSCOPY    . NO PAST SURGERIES      There were no vitals filed for this visit.  Subjective Assessment - 04/27/18 1158    Subjective  pt with no new complaints today.     Currently in Pain?  No/denies                       [facility] Adult PT Treatment/Exercise - 04/26/18 1442      Ambulation/Gait   Gait Comments  5 min walk;        Knee/Hip Exercises: Aerobic   Stationary Bike  L1 x 6 min      Knee/Hip Exercises: Standing   Heel Raises  20 reps    Hip Flexion  20 reps;Knee bent    Hip Abduction  Both;2 sets;10 reps    Forward Step Up  10 reps;Both    Stairs  up/down 5 steps with 2 railings, reciprocol pattern x4    Other Standing Knee Exercises  --    Other Standing Knee Exercises  Tandem stance 30 sec x2 bil, no UE support; Regular stance with L/R head turns x10; UE flexion x15;       Knee/Hip Exercises: Seated   Long Arc Quad  2 sets;10 reps;Both    Sit to [place]  10 reps;1 set              PT Education - 04/27/18 1159    Education provided  Yes    Education Details  Discussed home safety, need to sit down when showering and dressing, discussed installing grab bar for entering shower.     Methods  Explanation    Comprehension  Verbalized understanding       PT Short Term Goals - 04/13/18 1508      PT SHORT TERM GOAL #1   Title  Pt will initiate HEP in order to improve functional mobility.  (Target Date: 04/01/18)    Time  4    Period  Weeks    Status  Partially Met      PT SHORT TERM GOAL #2   Title  Pt will improve 5TSS time to </=12 seconds in order to indicate improved strength and decreased fall risk.     Time  4    Period  Weeks    Status  On-going      PT SHORT TERM GOAL #3   Title  Pt will improve FGA to 20/30 in order to indicate decreased fall risk.      Time  4    Period  Weeks    Status  On-going      PT SHORT TERM GOAL #4   Title  Will assess 6MWT and improve distance by 150' in order to indicate improved functional endurance.      Time  4    Period  Weeks    Status  New      PT SHORT TERM GOAL #5   Title  Pt will ambulate 1000' over varying outdoor surfaces w/o AD at mod I level in order to indicate improved community negotiation.     Time  4    Period  Weeks    Status  New        PT Long Term Goals - 03/02/18 1954      PT LONG TERM GOAL #1   Title  Pt will be independent with final HEP in order to indicate improved functional mobility.  (Target Date: 05/01/18)    Time  8    Period  Weeks    Status  New    Target Date  05/01/18      PT LONG TERM GOAL #2   Title  Pt will improve FGA to >/=23/30 in order to indicate decreased fall risk.      Time  8    Period  Weeks    Status  New      PT LONG TERM GOAL #3   Title  Pt will improve 6MWT by 300' from baseline in order to indicate improved functional endurance.     Time  8    Period  Weeks    Status  New      PT LONG TERM GOAL #4   Title  Pt will tolerate  ambulation x 30 minutes without seated rest break in order to indicate return to leisure walking program.      Time  8    Period  Weeks    Status  New      PT LONG TERM GOAL #5   Title  Pt will verbalize plans for community fitness once therapy has ended to maintain gains made in therapy.     Time  8    Period  Weeks    Status  New            Plan - 04/27/18 1200    Clinical Impression Statement  Pt required less seated rest breaks today, had improved endurance for sustaining activities. He has energy level that is variable, but improved today. He is showing improvements in ablity for ther ex, continues to require cuing to not look down at floor, and for standing posture. Showing improvements with balance, plan to re-assess balance next week, for possible d/c.     PT Treatment/Interventions  ADLs/Self Care Home Management;DME Instruction;Gait training;Stair training;Functional mobility training;Therapeutic activities;Therapeutic exercise;Balance training;Neuromuscular re-education;Patient/family education;Vestibular;Passive range of motion       Patient will benefit from skilled therapeutic intervention in order to improve the following deficits and impairments:  Decreased activity tolerance, Decreased balance, Decreased cognition, Decreased endurance, Decreased strength, Impaired perceived functional ability, Impaired flexibility, Improper body mechanics  Visit Diagnosis: Other abnormalities of gait and mobility  Muscle weakness (generalized)     Problem List Patient Active Problem List  Diagnosis Date Noted  . Imbalance 02/27/2018  . BMI 24.0-24.9, adult 02/26/2018  . Acid reflux 02/26/2018  . Memory changes 02/26/2018  . Vitamin D deficiency 03/01/2014  . Medication management 03/01/2014  . Hyperlipidemia   . History of prediabetes   . HTN (hypertension) 10/09/2013   Lyndee Hensen, PT, DPT 12:03 PM  04/27/18    [facility] [place], [place], [address] Phone: [phone]   Fax:  [phone]  Name: Kjuan Seipp. MRN: [id number] Date of Birth: [birth year]

## 2018-05-02 ENCOUNTER — Ambulatory Visit: Payer: Medicare HMO | Admitting: Physical Therapy

## 2018-05-02 DIAGNOSIS — M6281 Muscle weakness (generalized): Secondary | ICD-10-CM

## 2018-05-02 DIAGNOSIS — R2689 Other abnormalities of gait and mobility: Secondary | ICD-10-CM | POA: Diagnosis not present

## 2018-05-03 ENCOUNTER — Encounter: Payer: Self-pay | Admitting: Physical Therapy

## 2018-05-03 NOTE — Therapy (Signed)
[facility] [address] [place], [place], [address] Phone: [phone]   Fax:  [phone]  Physical Therapy Treatment  Patient Details  Name: Mathew Miller. MRN: [id number] Date of Birth: [birth year] Referring Provider: Janann Colonel, NP   Encounter Date: 05/02/2018  PT End of Session - 05/03/18 0914    Visit Number  12    Number of Visits  17    Date for PT Re-Evaluation  05/04/18    Authorization Type  [employer]    PT Start Time  1515    PT Stop Time  1557    PT Time Calculation (min)  42 min    Equipment Utilized During Treatment  Gait belt    Activity Tolerance  Patient tolerated treatment well    Behavior During Therapy  WFL for tasks assessed/performed       Past Medical History:  Diagnosis Date  . Constipation   . Hyperlipidemia   . Hypertension   . Prediabetes    no meds   . Vitamin D deficiency     Past Surgical History:  Procedure Laterality Date  . CATARACT EXTRACTION, BILATERAL  2017  . COLONOSCOPY    . NO PAST SURGERIES      There were no vitals filed for this visit.  Subjective Assessment - 05/03/18 0912    Subjective  Pt states that he has been able to walk about 20-30 min, it has been too hot for him go go further. He states doing HEp.                                PT Education - 05/03/18 0913    Education provided  Yes    Education Details  Discussed home safety, sitting to dress, and in shower, also grab bars in shower.     Person(s) Educated  Patient;Spouse    Methods  Explanation    Comprehension  Verbalized understanding       PT Short Term Goals - 04/13/18 1508      PT SHORT TERM GOAL #1   Title  Pt will initiate HEP in order to improve functional mobility.  (Target Date: 04/01/18)    Time  4    Period  Weeks    Status  Partially Met      PT SHORT TERM GOAL #2   Title  Pt will improve 5TSS time to </=12 seconds in order to indicate improved  strength and decreased fall risk.     Time  4    Period  Weeks    Status  On-going      PT SHORT TERM GOAL #3   Title  Pt will improve FGA to 20/30 in order to indicate decreased fall risk.      Time  4    Period  Weeks    Status  On-going      PT SHORT TERM GOAL #4   Title  Will assess 6MWT and improve distance by 150' in order to indicate improved functional endurance.      Time  4    Period  Weeks    Status  New      PT SHORT TERM GOAL #5   Title  Pt will ambulate 1000' over varying outdoor surfaces w/o AD at mod I level in order to indicate improved community negotiation.     Time  4    Period  Weeks    Status  New        PT Long Term Goals - 05/03/18 0917      PT LONG TERM GOAL #1   Title  Pt will be independent with final HEP in order to indicate improved functional mobility.  (Target Date: 05/01/18)    Time  8    Period  Weeks    Status  Partially Met      PT LONG TERM GOAL #2   Title  Pt will improve FGA to >/=23/30 in order to indicate decreased fall risk.      Baseline  will assess next visit    Time  8    Period  Weeks    Status  On-going      PT LONG TERM GOAL #3   Title  Pt will improve 6MWT by 300' from baseline in order to indicate improved functional endurance.     Time  8    Period  Weeks    Status  On-going      PT LONG TERM GOAL #4   Title  Pt will tolerate ambulation x 30 minutes without seated rest break in order to indicate return to leisure walking program.      Time  8    Period  Weeks    Status  Achieved      PT LONG TERM GOAL #5   Title  Pt will verbalize plans for community fitness once therapy has ended to maintain gains made in therapy.     Time  8    Period  Weeks    Status  Achieved            Plan - 05/03/18 0919    Clinical Impression Statement  POC extended until 7/18, plan to d/c at next visit. Pt has met most of LTGs, others to be tested at next visit. Sit to stand assess today, and home safety discussed with pt and  wife. Pt has required more than expected time imroving with safety cues,and has required repeated discussions and practice with safety in sessions, due to memory deficits. Pt now able to recite safety cues with walking, stairs, and home safety, but still has difficulty executing these at times, ie- he still has decreased step height on stairs, and toes make contact with steps, but pt is able to improve performance with max cueing during exercise. Repeated cueing during sessions has helped pt to require less cueing at this time. He is showing improvements with step height and safety with regular forward ambulation.  Plan to d/c re-test other balance next visit, and d/c to HEP. Will review final LE strengthening HEP next visit.     PT Treatment/Interventions  ADLs/Self Care Home Management;DME Instruction;Gait training;Stair training;Functional mobility training;Therapeutic activities;Therapeutic exercise;Balance training;Neuromuscular re-education;Patient/family education;Vestibular;Passive range of motion    Consulted and Agree with Plan of Care  Patient;Family member/caregiver    Family Member Consulted  Wife       Patient will benefit from skilled therapeutic intervention in order to improve the following deficits and impairments:  Decreased activity tolerance, Decreased balance, Decreased cognition, Decreased endurance, Decreased strength, Impaired perceived functional ability, Impaired flexibility, Improper body mechanics  Visit Diagnosis: Other abnormalities of gait and mobility  Muscle weakness (generalized)     Problem List Patient Active Problem List   Diagnosis Date Noted  . Imbalance 02/27/2018  . BMI 24.0-24.9, adult 02/26/2018  . Acid reflux 02/26/2018  . Memory changes 02/26/2018  . Vitamin D  deficiency 03/01/2014  . Medication management 03/01/2014  . Hyperlipidemia   . History of prediabetes   . HTN (hypertension) 10/09/2013    Lyndee Hensen, PT, DPT 9:27 AM   05/03/18   [facility] [address] [place], [place], [address] Phone: [phone]   Fax:  [phone]  Name: Mathew Miller. MRN: [id number] Date of Birth: 07-27-1946

## 2018-05-04 ENCOUNTER — Other Ambulatory Visit: Payer: Self-pay | Admitting: Internal Medicine

## 2018-05-04 ENCOUNTER — Encounter: Payer: Self-pay | Admitting: Physical Therapy

## 2018-05-04 ENCOUNTER — Encounter: Payer: Self-pay | Admitting: Internal Medicine

## 2018-05-04 ENCOUNTER — Ambulatory Visit: Payer: Medicare HMO | Admitting: Physical Therapy

## 2018-05-04 DIAGNOSIS — M6281 Muscle weakness (generalized): Secondary | ICD-10-CM

## 2018-05-04 DIAGNOSIS — R2689 Other abnormalities of gait and mobility: Secondary | ICD-10-CM | POA: Diagnosis not present

## 2018-05-04 MED ORDER — TERBINAFINE HCL 250 MG PO TABS: ORAL_TABLET | ORAL | 2 refills | Status: DC

## 2018-05-04 NOTE — Therapy (Signed)
[facility] [address] [place], [place], [address] Phone: [phone]   Fax:  [phone]  Physical Therapy Treatment/Discharge  Patient Details  Name: Mathew Miller. MRN: [id number] Date of Birth: 11/05/1945 Referring Provider: Janann Colonel, NP   Encounter Date: 05/04/2018  PT End of Session - 05/04/18 1415    Visit Number  13    Number of Visits  17    Date for PT Re-Evaluation  05/04/18    Authorization Type  [employer]    PT Start Time  1410    PT Stop Time  1449    PT Time Calculation (min)  39 min    Equipment Utilized During Treatment  Gait belt    Activity Tolerance  Patient tolerated treatment well    Behavior During Therapy  WFL for tasks assessed/performed       Past Medical History:  Diagnosis Date  . Constipation   . Hyperlipidemia   . Hypertension   . Prediabetes    no meds   . Vitamin D deficiency     Past Surgical History:  Procedure Laterality Date  . CATARACT EXTRACTION, BILATERAL  2017  . COLONOSCOPY    . NO PAST SURGERIES      There were no vitals filed for this visit.  Subjective Assessment - 05/04/18 1414    Subjective  Pt with  no new complaints. Pt states that he feels his balance is improving.     Currently in Pain?  No/denies         [facility] PT Assessment - 05/04/18 1434      Strength   Overall Strength Comments  Hips: 4/5, knees: 4+/5;       Transfers   Five time sit to stand comments   15.56 sec(taken 7/16)                    [facility] Adult PT Treatment/Exercise - 05/04/18 1434      Ambulation/Gait   Gait Comments  35 ft x6 with practice for speed and foot clearance;       Knee/Hip Exercises: Aerobic   Stationary Bike  L1 x 10 min      Knee/Hip Exercises: Standing   Heel Raises  20 reps    Hip Flexion  20 reps;Knee bent    Hip Abduction  Both;2 sets;10 reps    Stairs  up/down 5 steps with 2 railings, reciprocol pattern x4    Other Standing Knee  Exercises  Toe taps on 6 in step, no UE support, x25;     Other Standing Knee Exercises  Tandem stance 30 sec x2 bil, no UE support;       Knee/Hip Exercises: Seated   Long Arc Quad  2 sets;10 reps;Both    Sit to [employer]  10 reps;1 set             PT Education - 05/04/18 1415    Education provided  Yes    Education Details  Final HEP.     Person(s) Educated  Patient;Spouse    Methods  Explanation    Comprehension  Verbalized understanding       PT Short Term Goals - 05/04/18 1510      PT SHORT TERM GOAL #1   Title  Pt will initiate HEP in order to improve functional mobility.  (Target Date: 04/01/18)    Time  4    Period  Weeks    Status  Achieved  PT SHORT TERM GOAL #2   Title  Pt will improve 5TSS time to </=12 seconds in order to indicate improved strength and decreased fall risk.     Baseline  Pt has made good improvements on time, but is not less than 12 sec.     Time  4    Period  Weeks    Status  On-going      PT SHORT TERM GOAL #3   Title  Pt will improve FGA to 20/30 in order to indicate decreased fall risk.      Time  4    Period  Weeks    Status  Achieved      PT SHORT TERM GOAL #4   Title  Will assess 6MWT and improve distance by 150' in order to indicate improved functional endurance.      Baseline  not tested(goals made by initial evaluating therapist)     Time  4    Period  Weeks    Status  Deferred      PT SHORT TERM GOAL #5   Title  Pt will ambulate 1000' over varying outdoor surfaces w/o AD at mod I level in order to indicate improved community negotiation.     Time  4    Period  Weeks    Status  Achieved        PT Long Term Goals - 05/04/18 1512      PT LONG TERM GOAL #1   Title  Pt will be independent with final HEP in order to indicate improved functional mobility.  (Target Date: 05/01/18)    Time  8    Period  Weeks    Status  Achieved      PT LONG TERM GOAL #2   Title  Pt will improve FGA to >/=23/30 in order to indicate  decreased fall risk.      Baseline  achieved 20/30    Time  8    Period  Weeks    Status  Partially Met      PT LONG TERM GOAL #3   Title  Pt will improve 6MWT by 300' from baseline in order to indicate improved functional endurance.     Baseline  not tested(goals made by initial evaluating therapist)     Time  8    Period  Weeks    Status  Deferred      PT LONG TERM GOAL #4   Title  Pt will tolerate ambulation x 30 minutes without seated rest break in order to indicate return to leisure walking program.      Time  8    Period  Weeks    Status  Achieved      PT LONG TERM GOAL #5   Title  Pt will verbalize plans for community fitness once therapy has ended to maintain gains made in therapy.     Time  8    Period  Weeks    Status  Achieved            Plan - 05/04/18 1515    Clinical Impression Statement  Pt educated on final HEP today, handout given, discussed with wife. Pt with improvements on all balance testing. He has improved gait speed, step height, and safety with dynamic gait. He has shown variable need for verbal cueing for safety with stairs,  and stepping over cones. Pt with improved strength of LEs, and improved understanding of HEP. Pt has met goals for PT  at this time, and is ready for d/c to HEP.     PT Treatment/Interventions  ADLs/Self Care Home Management;DME Instruction;Gait training;Stair training;Functional mobility training;Therapeutic activities;Therapeutic exercise;Balance training;Neuromuscular re-education;Patient/family education;Vestibular;Passive range of motion    Consulted and Agree with Plan of Care  Patient;Family member/caregiver    Family Member Consulted  Wife       Patient will benefit from skilled therapeutic intervention in order to improve the following deficits and impairments:  Decreased activity tolerance, Decreased balance, Decreased cognition, Decreased endurance, Decreased strength, Impaired perceived functional ability, Impaired  flexibility, Improper body mechanics  Visit Diagnosis: Other abnormalities of gait and mobility  Muscle weakness (generalized)     Problem List Patient Active Problem List   Diagnosis Date Noted  . Imbalance 02/27/2018  . BMI 24.0-24.9, adult 02/26/2018  . Acid reflux 02/26/2018  . Memory changes 02/26/2018  . Vitamin D deficiency 03/01/2014  . Medication management 03/01/2014  . Hyperlipidemia   . History of prediabetes   . HTN (hypertension) 10/09/2013    Lyndee Hensen, PT, DPT 3:20 PM  05/04/18    [facility] [place], [place], [address] Phone: [phone]   Fax:  [phone]  Name: Mathew Miller. MRN: [id number] Date of Birth: [birth year]       PHYSICAL THERAPY DISCHARGE SUMMARY  Visits from Start of Care:13   Plan: Patient agrees to discharge.  Patient goals were met. Patient is being discharged due to meeting the stated rehab goals.  ?????      Lyndee Hensen, PT, DPT 3:20 PM  05/04/18

## 2018-05-06 ENCOUNTER — Other Ambulatory Visit: Payer: Self-pay | Admitting: Internal Medicine

## 2018-05-08 ENCOUNTER — Other Ambulatory Visit: Payer: Self-pay | Admitting: Internal Medicine

## 2018-05-08 ENCOUNTER — Encounter: Payer: Medicare HMO | Admitting: Physical Therapy

## 2018-05-08 DIAGNOSIS — K219 Gastro-esophageal reflux disease without esophagitis: Secondary | ICD-10-CM

## 2018-05-08 MED ORDER — OMEPRAZOLE 20 MG PO CPDR: DELAYED_RELEASE_CAPSULE | ORAL | 3 refills | Status: DC

## 2018-05-09 ENCOUNTER — Other Ambulatory Visit: Payer: Self-pay | Admitting: Internal Medicine

## 2018-05-10 ENCOUNTER — Encounter: Payer: Medicare HMO | Admitting: Physical Therapy

## 2018-05-10 ENCOUNTER — Other Ambulatory Visit: Payer: Self-pay | Admitting: Internal Medicine

## 2018-05-10 DIAGNOSIS — K219 Gastro-esophageal reflux disease without esophagitis: Secondary | ICD-10-CM

## 2018-05-10 MED ORDER — OMEPRAZOLE 20 MG PO CPDR: DELAYED_RELEASE_CAPSULE | ORAL | 3 refills | Status: DC

## 2018-06-23 ENCOUNTER — Encounter: Payer: Self-pay | Admitting: Internal Medicine

## 2018-06-25 ENCOUNTER — Encounter: Payer: Self-pay | Admitting: Internal Medicine

## 2018-06-25 NOTE — Progress Notes (Signed)
[facility]   Unk Pinto, M.D.     Uvaldo Bristle. Silverio Lay, P.A.-C Liane Comber, [place]                [address] [place], [place] [address] Telephone [phone] Telefax [phone] Annual  Screening/Preventative Visit  & Comprehensive Evaluation & Examination     This very nice 72 y.o. [name] presents for a Screening /Preventative Visit & comprehensive evaluation and management of multiple medical co-morbidities.  Patient has been followed for HTN, HLD, Prediabetes and Vitamin D Deficiency.     Patient has been followed by Dr Tomi Likens for mild Dementia  (Mar 2019) and had Brain MRI showing expected Atrophy and moderate chronic microvascular ischemic changes including old strokes  in the white matter & pons. His B12 level in Mar 2019 was low at 235 & he was advised SL B12.      HTN predates circa 2000. Patient's BP has been controlled at home.  Today's BP is at goal  106/72. Patient denies any cardiac symptoms as chest pain, palpitations, shortness of breath, dizziness or ankle swelling.     Patient's hyperlipidemia is controlled with diet and medications. Patient denies myalgias or other medication SE's. Last lipids were at goal: Lab Results  Component Value Date   CHOL 143 02/27/2018   HDL 59 02/27/2018   LDLCALC 68 02/27/2018   TRIG 80 02/27/2018   CHOLHDL 2.4 02/27/2018      Patient has hx/o prediabetes  (A1c 6.1%/2012) and patient denies reactive hypoglycemic symptoms, visual blurring, diabetic polys or paresthesias. Last A1c was Normal & at goal: Lab Results  Component Value Date   HGBA1C 5.3 11/29/2017       Finally, patient has history of Vitamin D Deficiency  ("33"/2008) and last vitamin D was at goal: Lab Results  Component Value Date   VD25OH 68 11/29/2017   Current Outpatient Medications on File Prior to Visit  Medication Sig  . ACCU-CHEK FASTCLIX LANCETS MISC CHECK BLOOD  SUGAR 1 TIME DAILY  . ACCU-CHEK SMARTVIEW test strip CHECK BLOOD SUGAR 1 TIME DAILY  . Alcohol Swabs (B-D SINGLE USE SWABS REGULAR) PADS Check blood sugar 1 time daily-DX-R73.03  . Blood Glucose Monitoring Suppl (ACCU-CHEK NANO SMARTVIEW) w/Device KIT CHECK BLOOD SUGAR 1 TIME DAILY-DX-R73.03  . Cholecalciferol (VITAMIN D3 PO) Take 2,500 Units by mouth daily. Take 1/2 tablet daily  . VITAMIN B-12 2500 MCG SUBL  Take 2,500 mcg by mouth daily  . fluconazole  150 MG tablet Take 1 tablet 2 x/weekly (Mon & Thurs) for skin fungus  . Magnesium 250 MG TABS Take 250 mg by mouth daily.  . Prostaglandin EL. 0.4 mm/20 mg  Inject 0.4 mg as needed into the penis  . omeprazole  20 MG capsule Take 1 capsule 2 x/day for acid reflux & indigestion  . PAZEO 0.7 % SOLN   . Polyethylene Glycol  17 g Take daily.  . Pravastatin  40 MG tablet TAKE 1 TABLET EVERY EVENING  . promethazine  25 MG tablet Takes 1 tablet every 4 hours as needed for nausea or vomititng.  . terbinafine  250 MG tablet Take 1 tablet daily for skin fungus  . triamcinolone cream 0.1 % Apply sparingly 2 to 3 x day (do not use on face)   Allergies  Allergen Reactions  . Hydrocodone-Acetaminophen Nausea Only  . Sudafed [  Pseudoephedrine Hcl]     Urinary retention  . Tetanus Toxoids     Itching, burning feeling, ran a fever   Past Medical History:  Diagnosis Date  . Constipation   . Hyperlipidemia   . Hypertension   . Prediabetes    no meds   . Vitamin D deficiency    Health Maintenance  Topic Date Due  . INFLUENZA VACCINE  05/18/2018  . Hepatitis C Screening  08/31/2023 (Originally [birth year])  . TETANUS/TDAP  08/26/2027 (Originally 10/19/1975)  . COLONOSCOPY  09/05/2019  . PNA vac Low Risk Adult  Completed   Immunization History  Administered Date(s) Administered  . Influenza Split 08/06/2013  . Influenza, High Dose Seasonal PF 07/30/2014, 07/29/2015, 08/11/2016, 07/20/2017  . Pneumococcal Conjugate-13 04/23/2016  . Pneumococcal  Polysaccharide-23 10/21/2009  . Td 10/18/1965  . Zoster 10/19/2011  . Zoster Recombinat (Shingrix) 03/18/2018, 06/09/2018   Last Colon - 09/04/2014 - Dr Deatra Ina  (+) Adenoma - recc f/u 5 years due  Nov 2020   Past Surgical History:  Procedure Laterality Date  . CATARACT EXTRACTION, BILATERAL  2017  . COLONOSCOPY    . NO PAST SURGERIES     Family History  Problem Relation Age of Onset  . Hyperlipidemia Mother   . Glaucoma Mother   . Heart disease Father   . Hypertension Father   . Colon cancer Neg Hx   . Esophageal cancer Neg Hx   . Pancreatic cancer Neg Hx   . Prostate cancer Neg Hx   . Rectal cancer Neg Hx   . Stomach cancer Neg Hx    Social History   Socioeconomic History  . Marital status: Unknown    Spouse name: [name]  . Number of children: 2 daughters  & 2 GC  Occupational History  . Occupation: retired Environmental education officer  Tobacco Use  . Smoking status: Never Smoker  . Smokeless tobacco: Never Used  Substance and Sexual Activity  . Alcohol use: Yes    Alcohol/week: 0.0 standard drinks    Comment: very Rare /Beer.wine.  . Drug use: No  . Sexual activity: Not on file    ROS Constitutional: Denies fever, chills, weight loss/gain, headaches, insomnia,  night sweats or change in appetite. Does c/o fatigue. Eyes: Denies redness, blurred vision, diplopia, discharge, itchy or watery eyes.  ENT: Denies discharge, congestion, post nasal drip, epistaxis, sore throat, earache, hearing loss, dental pain, Tinnitus, Vertigo, Sinus pain or snoring.  Cardio: Denies chest pain, palpitations, irregular heartbeat, syncope, dyspnea, diaphoresis, orthopnea, PND, claudication or edema Respiratory: denies cough, dyspnea, DOE, pleurisy, hoarseness, laryngitis or wheezing.  Gastrointestinal: Denies dysphagia, heartburn, reflux, water brash, pain, cramps, nausea, vomiting, bloating, diarrhea, constipation, hematemesis, melena, hematochezia, jaundice or  hemorrhoids Genitourinary: Denies dysuria, frequency, urgency, nocturia, hesitancy, discharge, hematuria or flank pain Musculoskeletal: Denies arthralgia, myalgia, stiffness, Jt. Swelling, pain, limp or strain/sprain. Denies Falls. Skin: Denies puritis, rash, hives, warts, acne, eczema or change in skin lesion Neuro: No weakness, tremor, incoordination, spasms, paresthesia or pain Psychiatric: Denies confusion, but admits difficulty remembering words, names, dates ,etc.  Denies Depression. Endocrine: Denies change in weight, skin, hair change, nocturia, and paresthesia, diabetic polys, visual blurring or hyper / hypo glycemic episodes.  Heme/Lymph: No excessive bleeding, bruising or enlarged lymph nodes.  Physical Exam  BP 106/72   Pulse 96   Temp (!) 97.5 F (36.4 C)   Resp 16   Ht _0  (1.854 m)   Wt 189 lb 9.6 oz (86 kg)  BMI 25.01 kg/m   General Appearance: Well nourished and well groomed and in no apparent distress.  Eyes: PERRLA, EOMs, conjunctiva no swelling or erythema, normal fundi and vessels. Sinuses: No frontal/maxillary tenderness ENT/Mouth: EACs patent / TMs  nl. Nares clear without erythema, swelling, mucoid exudates. Oral hygiene is good. No erythema, swelling, or exudate. Tongue normal, non-obstructing. Tonsils not swollen or erythematous. Hearing normal.  Neck: Supple, thyroid not palpable. No bruits, nodes or JVD. Respiratory: Respiratory effort normal.  BS equal and clear bilateral without rales, rhonci, wheezing or stridor. Cardio: Heart sounds are normal with regular rate and rhythm and no murmurs, rubs or gallops. Peripheral pulses are normal and equal bilaterally without edema. No aortic or femoral bruits. Chest: symmetric with normal excursions and percussion.  Abdomen: Soft, with Nl bowel sounds. Nontender, no guarding, rebound, hernias, masses, or organomegaly.  Lymphatics: Non tender without lymphadenopathy.  Genitourinary: DRE - prostate nl for age -  smooth & firm w/o nodules. Musculoskeletal: Full ROM all peripheral extremities, joint stability, 5/5 strength, and normal gait. Skin: Warm and dry without rashes, lesions, cyanosis, clubbing or  ecchymosis.  Neuro: Cranial nerves intact, reflexes equal bilaterally. Normal muscle tone, no cerebellar symptoms. Sensation intact.  Pysch: Alert and oriented X 3 with normal affect, insight and judgment appropriate.   Assessment and Plan  1. Annual Preventative/Screening Exam   2. Essential hypertension  - EKG 12-Lead - [place], RETROPERITNL ABD,  LTD - Urinalysis, Routine w reflex microscopic - Microalbumin / creatinine urine ratio - CBC with Differential/Platelet - COMPLETE METABOLIC PANEL WITH GFR - Magnesium - TSH  3. Hyperlipidemia, mixed  - EKG 12-Lead - [place], RETROPERITNL ABD,  LTD - Lipid panel - TSH  4. Abnormal glucose  - EKG 12-Lead - [place], RETROPERITNL ABD,  LTD - Hemoglobin A1c - Insulin, random  5. Vitamin D deficiency  - VITAMIN D 25 Hydroxyl  6. Prediabetes  - EKG 12-Lead - [place], RETROPERITNL ABD,  LTD - Hemoglobin A1c - Insulin, random  7. Vitamin B deficiency  - Vitamin B12  8. Gastroesophageal reflux disease  - CBC with Differential/Platelet  9. Unsteady gait  10. Dementia without behavioral disturbance   11. Screening for colorectal cancer  - POC Hemoccult Bld/Stl  12. Screening for ischemic heart disease  - EKG 12-Lead  13. FHx: heart disease  - EKG 12-Lead - [place], RETROPERITNL ABD,  LTD  14. Screening for AAA (aortic abdominal aneurysm)  - [place], RETROPERITNL ABD,  LTD  15. Medication management  - Urinalysis, Routine w reflex microscopic - Microalbumin / creatinine urine ratio - CBC with Differential/Platelet - COMPLETE METABOLIC PANEL WITH GFR - Magnesium - Lipid panel - TSH - Hemoglobin A1c - Insulin, random - VITAMIN D 25 Hydroxyl       Patient was counseled in prudent diet, weight control to achieve/maintain BMI less than  25, BP monitoring, regular exercise and medications as discussed.  Discussed med effects and SE's. Routine screening labs and tests as requested with regular follow-up as recommended. Over 40 minutes of exam, counseling, chart review and high complex critical decision making was performed

## 2018-06-25 NOTE — Patient Instructions (Signed)

## 2018-06-26 ENCOUNTER — Encounter: Payer: Self-pay | Admitting: Internal Medicine

## 2018-06-26 ENCOUNTER — Ambulatory Visit (INDEPENDENT_AMBULATORY_CARE_PROVIDER_SITE_OTHER): Payer: Medicare HMO | Admitting: Internal Medicine

## 2018-06-26 VITALS — BP 106/72 | HR 96 | Temp 97.5°F | Resp 16 | Ht 73.0 in | Wt 189.6 lb

## 2018-06-26 DIAGNOSIS — Z1211 Encounter for screening for malignant neoplasm of colon: Secondary | ICD-10-CM

## 2018-06-26 DIAGNOSIS — R7303 Prediabetes: Secondary | ICD-10-CM

## 2018-06-26 DIAGNOSIS — R7309 Other abnormal glucose: Secondary | ICD-10-CM

## 2018-06-26 DIAGNOSIS — F0390 Unspecified dementia without behavioral disturbance: Secondary | ICD-10-CM

## 2018-06-26 DIAGNOSIS — I10 Essential (primary) hypertension: Secondary | ICD-10-CM

## 2018-06-26 DIAGNOSIS — K219 Gastro-esophageal reflux disease without esophagitis: Secondary | ICD-10-CM

## 2018-06-26 DIAGNOSIS — Z125 Encounter for screening for malignant neoplasm of prostate: Secondary | ICD-10-CM | POA: Diagnosis not present

## 2018-06-26 DIAGNOSIS — Z0000 Encounter for general adult medical examination without abnormal findings: Secondary | ICD-10-CM

## 2018-06-26 DIAGNOSIS — Z1212 Encounter for screening for malignant neoplasm of rectum: Secondary | ICD-10-CM

## 2018-06-26 DIAGNOSIS — R2681 Unsteadiness on feet: Secondary | ICD-10-CM

## 2018-06-26 DIAGNOSIS — Z136 Encounter for screening for cardiovascular disorders: Secondary | ICD-10-CM | POA: Diagnosis not present

## 2018-06-26 DIAGNOSIS — E539 Vitamin B deficiency, unspecified: Secondary | ICD-10-CM

## 2018-06-26 DIAGNOSIS — E782 Mixed hyperlipidemia: Secondary | ICD-10-CM | POA: Diagnosis not present

## 2018-06-26 DIAGNOSIS — E559 Vitamin D deficiency, unspecified: Secondary | ICD-10-CM | POA: Diagnosis not present

## 2018-06-26 DIAGNOSIS — N401 Enlarged prostate with lower urinary tract symptoms: Secondary | ICD-10-CM | POA: Diagnosis not present

## 2018-06-26 DIAGNOSIS — N138 Other obstructive and reflux uropathy: Secondary | ICD-10-CM | POA: Diagnosis not present

## 2018-06-26 DIAGNOSIS — Z8249 Family history of ischemic heart disease and other diseases of the circulatory system: Secondary | ICD-10-CM

## 2018-06-26 DIAGNOSIS — Z79899 Other long term (current) drug therapy: Secondary | ICD-10-CM | POA: Diagnosis not present

## 2018-06-26 DIAGNOSIS — Z0001 Encounter for general adult medical examination with abnormal findings: Secondary | ICD-10-CM

## 2018-06-26 DIAGNOSIS — R972 Elevated prostate specific antigen [PSA]: Secondary | ICD-10-CM | POA: Diagnosis not present

## 2018-06-26 MED ORDER — OXYBUTYNIN CHLORIDE ER 10 MG PO TB24: ORAL_TABLET | ORAL | 0 refills | Status: DC

## 2018-06-28 ENCOUNTER — Other Ambulatory Visit: Payer: Self-pay | Admitting: Internal Medicine

## 2018-06-28 DIAGNOSIS — R972 Elevated prostate specific antigen [PSA]: Secondary | ICD-10-CM

## 2018-06-28 DIAGNOSIS — Z125 Encounter for screening for malignant neoplasm of prostate: Secondary | ICD-10-CM

## 2018-06-28 DIAGNOSIS — N138 Other obstructive and reflux uropathy: Secondary | ICD-10-CM

## 2018-06-28 DIAGNOSIS — N401 Enlarged prostate with lower urinary tract symptoms: Secondary | ICD-10-CM

## 2018-06-28 LAB — COMPLETE METABOLIC PANEL WITH GFR
AG Ratio: 1.9 (calc) (ref 1.0–2.5)
ALT: 15 U/L (ref 9–46)
AST: 14 U/L (ref 10–35)
Albumin: 4.5 g/dL (ref 3.6–5.1)
Alkaline phosphatase (APISO): 87 U/L (ref 40–115)
BUN/Creatinine Ratio: 14 (calc) (ref 6–22)
BUN: 19 mg/dL (ref 7–25)
CO2: 28 mmol/L (ref 20–32)
Calcium: 9.7 mg/dL (ref 8.6–10.3)
Chloride: 103 mmol/L (ref 98–110)
Creat: 1.38 mg/dL — ABNORMAL HIGH (ref 0.70–1.18)
GFR, Est African American: 58 mL/min/{1.73_m2} — ABNORMAL LOW (ref >=60)
GFR, Est Non African American: 50 mL/min/{1.73_m2} — ABNORMAL LOW (ref >=60)
Globulin: 2.4 g/dL (calc) (ref 1.9–3.7)
Glucose, Bld: 107 mg/dL — ABNORMAL HIGH (ref 65–99)
Potassium: 3.8 mmol/L (ref 3.5–5.3)
Sodium: 141 mmol/L (ref 135–146)
Total Bilirubin: 1 mg/dL (ref 0.2–1.2)
Total Protein: 6.9 g/dL (ref 6.1–8.1)

## 2018-06-28 LAB — CBC WITH DIFFERENTIAL/PLATELET
Basophils Absolute: 47 cells/uL (ref 0–200)
Basophils Relative: 0.4 %
Eosinophils Absolute: 35 cells/uL (ref 15–500)
Eosinophils Relative: 0.3 %
HCT: 47.9 % (ref 38.5–50.0)
Hemoglobin: 16.4 g/dL (ref 13.2–17.1)
Lymphs Abs: 1533 cells/uL (ref 850–3900)
MCH: 30.5 pg (ref 27.0–33.0)
MCHC: 34.2 g/dL (ref 32.0–36.0)
MCV: 89 fL (ref 80.0–100.0)
MPV: 10.3 fL (ref 7.5–12.5)
Monocytes Relative: 7.5 %
Neutro Abs: 9208 cells/uL — ABNORMAL HIGH (ref 1500–7800)
Neutrophils Relative %: 78.7 %
Platelets: 207 10*3/uL (ref 140–400)
RBC: 5.38 10*6/uL (ref 4.20–5.80)
RDW: 13.1 % (ref 11.0–15.0)
Total Lymphocyte: 13.1 %
WBC mixed population: 878 cells/uL (ref 200–950)
WBC: 11.7 10*3/uL — ABNORMAL HIGH (ref 3.8–10.8)

## 2018-06-28 LAB — URINALYSIS, ROUTINE W REFLEX MICROSCOPIC
Bacteria, UA: NONE SEEN /HPF
Bilirubin Urine: NEGATIVE
Glucose, UA: NEGATIVE
Hgb urine dipstick: NEGATIVE
Hyaline Cast: NONE SEEN /LPF
Ketones, ur: NEGATIVE
Leukocytes, UA: NEGATIVE
Nitrite: NEGATIVE
Specific Gravity, Urine: 1.021 (ref 1.001–1.03)
Squamous Epithelial / LPF: NONE SEEN /HPF (ref <=5)
pH: 6.5 (ref 5.0–8.0)

## 2018-06-28 LAB — LIPID PANEL
Cholesterol: 168 mg/dL (ref <200)
HDL: 61 mg/dL (ref >40)
LDL Cholesterol (Calc): 90 mg/dL (calc)
Non-HDL Cholesterol (Calc): 107 mg/dL (calc) (ref <130)
Total CHOL/HDL Ratio: 2.8 (calc) (ref <5.0)
Triglycerides: 84 mg/dL (ref <150)

## 2018-06-28 LAB — HEMOGLOBIN A1C
Hgb A1c MFr Bld: 5.4 % of total Hgb (ref <5.7)
Mean Plasma Glucose: 108 (calc)
eAG (mmol/L): 6 (calc)

## 2018-06-28 LAB — TEST AUTHORIZATION

## 2018-06-28 LAB — VITAMIN D 25 HYDROXY (VIT D DEFICIENCY, FRACTURES): Vit D, 25-Hydroxy: 67 ng/mL (ref 30–100)

## 2018-06-28 LAB — MAGNESIUM: Magnesium: 2.2 mg/dL (ref 1.5–2.5)

## 2018-06-28 LAB — MICROALBUMIN / CREATININE URINE RATIO
Creatinine, Urine: 182 mg/dL (ref 20–320)
Microalb Creat Ratio: 25 mcg/mg creat (ref <30)
Microalb, Ur: 4.5 mg/dL

## 2018-06-28 LAB — PSA: PSA: 5.4 ng/mL — ABNORMAL HIGH (ref <=4.0)

## 2018-06-28 LAB — INSULIN, RANDOM: Insulin: 12 u[IU]/mL (ref 2.0–19.6)

## 2018-06-28 LAB — VITAMIN B12: Vitamin B-12: >2000 pg/mL — ABNORMAL HIGH (ref 200–1100)

## 2018-06-28 LAB — TSH: TSH: 2.43 mIU/L (ref 0.40–4.50)

## 2018-06-29 ENCOUNTER — Other Ambulatory Visit: Payer: Self-pay | Admitting: Internal Medicine

## 2018-06-29 DIAGNOSIS — E539 Vitamin B deficiency, unspecified: Secondary | ICD-10-CM

## 2018-06-29 MED ORDER — CYANOCOBALAMIN 500 MCG PO TABS: ORAL_TABLET | ORAL | 3 refills | Status: DC

## 2018-07-08 ENCOUNTER — Other Ambulatory Visit: Payer: Self-pay | Admitting: Internal Medicine

## 2018-07-08 DIAGNOSIS — N3281 Overactive bladder: Secondary | ICD-10-CM

## 2018-07-08 MED ORDER — OXYBUTYNIN CHLORIDE ER 10 MG PO TB24: ORAL_TABLET | ORAL | 3 refills | Status: DC

## 2018-07-12 ENCOUNTER — Telehealth: Payer: Self-pay | Admitting: Neurology

## 2018-07-12 NOTE — Telephone Encounter (Signed)
Patient wife called states when patient was scheduled in the past with [facility] we had to do a prior [place]. She wants to make sure we get the prior auth for his appt in oct with [facility]. Please call patient wife when this is done so she know it is taken care of before they come to the appt

## 2018-07-21 ENCOUNTER — Ambulatory Visit: Payer: Self-pay

## 2018-07-26 NOTE — Telephone Encounter (Signed)
I will check on this. °

## 2018-07-27 MED ORDER — PROMETHAZINE-DM 6.25-15 MG/5ML PO SYRP: 5.0000 mL | ORAL_SOLUTION | Freq: Four times a day (QID) | ORAL | 0 refills | Status: DC | PRN

## 2018-08-01 NOTE — Telephone Encounter (Signed)
Sending back to [facility].

## 2018-08-02 ENCOUNTER — Other Ambulatory Visit: Payer: Self-pay | Admitting: Internal Medicine

## 2018-08-02 DIAGNOSIS — L259 Unspecified contact dermatitis, unspecified cause: Secondary | ICD-10-CM

## 2018-08-02 MED ORDER — TRIAMCINOLONE ACETONIDE 0.1 % EX CREA: TOPICAL_CREAM | CUTANEOUS | 3 refills | Status: DC

## 2018-08-07 ENCOUNTER — Other Ambulatory Visit: Payer: Self-pay | Admitting: *Deleted

## 2018-08-07 MED ORDER — ACCU-CHEK SOFTCLIX LANCETS MISC: 1 refills | Status: DC

## 2018-08-07 MED ORDER — ACCU-CHEK AVIVA PLUS W/DEVICE KIT: PACK | 0 refills | Status: DC

## 2018-08-07 MED ORDER — GLUCOSE BLOOD VI STRP: ORAL_STRIP | 1 refills | Status: DC

## 2018-08-08 ENCOUNTER — Other Ambulatory Visit: Payer: Self-pay | Admitting: *Deleted

## 2018-08-08 MED ORDER — ACCU-CHEK AVIVA PLUS W/DEVICE KIT: PACK | 0 refills | Status: DC

## 2018-08-08 MED ORDER — ACCU-CHEK SOFTCLIX LANCETS MISC: 1 refills | Status: DC

## 2018-08-08 MED ORDER — GLUCOSE BLOOD VI STRP: ORAL_STRIP | 1 refills | Status: DC

## 2018-08-14 NOTE — Progress Notes (Signed)
[facility], spoke with Marylyn Ishihara (male), gave her all 6 codes used for the nueropsych testing, she said there is no prior auth required. CPT codes:  24097 [id number] JJ[facility]#[id number]

## 2018-08-17 ENCOUNTER — Ambulatory Visit: Payer: Medicare HMO | Admitting: Psychology

## 2018-08-17 ENCOUNTER — Encounter: Payer: Self-pay | Admitting: Psychology

## 2018-08-17 ENCOUNTER — Ambulatory Visit (INDEPENDENT_AMBULATORY_CARE_PROVIDER_SITE_OTHER): Payer: Medicare HMO | Admitting: Psychology

## 2018-08-17 DIAGNOSIS — R413 Other amnesia: Secondary | ICD-10-CM

## 2018-08-17 NOTE — Progress Notes (Signed)
NEUROBEHAVIORAL STATUS EXAM   Name: Mathew Miller. Date of Birth: [birth year] Date of Interview: 08/17/2018  Reason for Referral:  Mathew Miller. is a 72 y.o. male who is referred for neuropsychological evaluation by Dr. Metta Clines of [facility] due to concerns about memory loss. This patient is accompanied in the office by his wife who supplements the history.  History of Presenting Problem:  Mr. Rayl was seen by Dr. Tomi Likens for initial neurologic consultation on 01/10/2018 for dizziness and memory changes. MMSE was 21/30. At that time he reported memory difficulty for about 6 months, primarily word finding difficulty. He had a brain MRI on 01/19/2018 which showed no acute intracranial abnormality; there was atrophy and moderate chronic microvascular ischemic change in the white matter and pons noted. Subsequent to seeing Dr. Tomi Likens, atenolol was discontinued. The patient and his wife report that his dizziness and walking improved greatly immediately after stopping the medication. They have also noticed improvement in word finding and memory since stopping the medication. At the present time they do not have significant concerns about cognitive functioning. They endorse occasional forgetfulness for recent conversations. They deny forgetfulness for recent events, misplacing/losing items, repeating statements/questions, forgetting to take medications, forgetting appointments, word finding difficulty, difficulty concentrating or staying on task, comprehension problems or problems with navigation. The patient does not do much driving secondary to vision changes. He manages his medications and appointments with no difficulty. His wife has always managed the finances and cooking. The patient has no known family history of dementia. His mother is still alive at age 13 and is just beginning to show some cognitive changes.  Physically, the patient reports feeling "fair". He and his wife walk  daily, weather permitting. He does not have any chronic pain. He is no longer having any issues with balance or walking.   Psychiatric history was denied. He reported stable mood and denied any depression or anxiety. He denies past or present suicidal ideation. His wife agrees mood is stable. He does not have any sleep difficulty. He feels rested upon awakening. His wife notes he snores. He has never been tested for sleep apnea.   Social History: Born/Raised: [place] Education: High school Occupational history: Educational psychologist for a Engineer, maintenance ([employer]); retired approx 4 years ago Marital history: Married x 26 years, 2 daughters, 2 grandchildren Alcohol: None Tobacco: None   Medical History: Past Medical History:  Diagnosis Date  . Constipation   . Hyperlipidemia   . Hypertension   . Prediabetes    no meds   . Vitamin D deficiency      Current Medications:  Outpatient Encounter Medications as of 08/17/2018  Medication Sig  . ACCU-CHEK SOFTCLIX LANCETS lancets Check blood sugar 1 time a day.DX-R73.03  . Alcohol Swabs (B-D SINGLE USE SWABS REGULAR) PADS Check blood sugar 1 time daily-DX-R73.03  . Blood Glucose Monitoring Suppl (ACCU-CHEK AVIVA PLUS) w/Device KIT Check blood sugar 1 time a day-DX-R73.03  . Cholecalciferol (VITAMIN D3 PO) Take 2,500 Units by mouth daily. Take 1/2 tablet daily  . glucose blood (ACCU-CHEK AVIVA PLUS) test strip Check blood sugar 1 time a day-DX-R73.03  . Magnesium 250 MG TABS Take 250 mg by mouth daily.  . NONFORMULARY OR COMPOUNDED ITEM Inject 0.4 mg into the skin as needed. Prostaglandin EL. 0.4 mm/20 mg as needed injected into the penis  . omeprazole (PRILOSEC) 20 MG capsule Take 1 capsule 2 x/day for acid reflux & indigestion  . oxybutynin (DITROPAN-XL) 10 MG  24 hr tablet Take 1 tablet every morning for irritable bladder  . PAZEO 0.7 % SOLN   . Polyethylene Glycol 3350 (MIRALAX PO) Take 17 g/day by mouth daily.  . pravastatin (PRAVACHOL) 40 MG tablet  TAKE 1 TABLET EVERY EVENING  . promethazine (PHENERGAN) 25 MG tablet Takes 1 tablet every 4 hours as needed for nausea or vomititng.  . promethazine-dextromethorphan (PROMETHAZINE-DM) 6.25-15 MG/5ML syrup Take 5 mLs by mouth 4 (four) times daily as needed for cough.  . terbinafine (LAMISIL) 250 MG tablet Take 1 tablet daily for skin fungus  . triamcinolone cream (KENALOG) 0.1 % Apply sparingly 2 to 3 x day (do not use on face)  . vitamin B-12 (CYANOCOBALAMIN) 500 MCG tablet Dissolve 1 tablet under tongue daily   No facility-administered encounter medications on file as of 08/17/2018.      Behavioral Observations:   Appearance: Neatly and appropriately dressed and groomed Gait: Ambulated independently, no gross abnormalities observed Speech: Fluent; soft volume, mildly reduced rate, increased response latencies. No significant word finding difficulty. Thought process: Linear, goal directed Affect: Full, euthymic Interpersonal: Pleasant, appropriate   30 minutes spent face-to-face with patient completing neurobehavioral status exam. 20 minutes spent integrating medical records/clinical data and completing this report. [id number] unit.   TESTING: There is medical necessity to proceed with neuropsychological assessment as the results will be used to aid in differential diagnosis and clinical decision-making and to inform specific treatment recommendations. Per the patient, his wife and medical records reviewed, there has been a change in cognitive functioning and a reasonable suspicion of neurocognitive disorder (perhaps was related to medication effect since he and his wife report improvement following discontinuation of medication; hopefully testing will show improvement from MMSE of 21/30 back in March).  Clinical Decision Making: In considering the patient's current level of functioning, level of presumed impairment, nature of symptoms, emotional and behavioral responses during the  interview, level of literacy, and observed level of motivation, a battery of tests was selected and communicated to the psychometrician.   Following the clinical interview/neurobehavioral status exam, the patient completed this full battery of neuropsychological testing with my psychometrician under my supervision (see separate note).   PLAN: The patient will return to see me for a follow-up session at which time his test performances and my impressions and treatment recommendations will be reviewed in detail.  Evaluation ongoing; full report to follow.

## 2018-08-17 NOTE — Progress Notes (Signed)
   Neuropsychology Note  Mathew Miller. completed 60 minutes of neuropsychological testing with technician, Mathew Miller, BS, under the supervision of Mathew Miller, Licensed Psychologist. The patient did not appear overtly distressed by the testing session, per behavioral observation or via self-report to the technician. Rest breaks were offered.   Clinical Decision Making: In considering the patient's current level of functioning, level of presumed impairment, nature of symptoms, emotional and behavioral responses during the interview, level of literacy, and observed level of motivation/effort, a battery of tests was selected and communicated to the psychometrician.  Communication between the psychologist and technician was ongoing throughout the testing session and changes were made as deemed necessary based on patient performance on testing, technician observations and additional pertinent factors such as those listed above.  Mathew Phi. will return within approximately 2 weeks for an interactive feedback session with Mathew Miller at which time his test performances, clinical impressions and treatment recommendations will be reviewed in detail. The patient understands he can contact our office should he require our assistance before this time.  35 minutes spent performing neuropsychological evaluation services/clinical decision making (psychologist). [CPT 22575] 60 minutes spent face-to-face with patient administering standardized tests, 30 minutes spent scoring (technician). [CPT [id number], 05183]  Full report to follow.

## 2018-08-30 NOTE — Progress Notes (Signed)
NEUROPSYCHOLOGICAL EVALUATION   Name:    Mathew Miller.  Date of Birth:   23-Dec-1945 Date of Interview:  08/17/2018 Date of Testing:  08/17/2018   Date of Feedback:  08/31/2018       Background Information:  Reason for Referral:  Mathew Noyce. is a 72 y.o. male referred by Dr. Metta Clines to assess his current level of cognitive functioning and assist in differential diagnosis. The current evaluation consisted of a review of available medical records, an interview with the patient and his wife, and the completion of a neuropsychological testing battery. Informed consent was obtained.  History of Presenting Problem:  Mathew Miller was seen by Dr. Tomi Likens for initial neurologic consultation on 01/10/2018 for dizziness and memory changes. MMSE was 21/30. At that time he reported memory difficulty for about 6 months, primarily word finding difficulty. He had a brain MRI on 01/19/2018 which showed no acute intracranial abnormality; there was atrophy and moderate chronic microvascular ischemic change in the white matter and pons noted. Subsequent to seeing Dr. Tomi Likens, atenolol was discontinued. The patient and his wife report that his dizziness and walking improved greatly immediately after stopping the medication. They have also noticed improvement in word finding and memory since stopping the medication. At the present time they do not have significant concerns about cognitive functioning. They endorse occasional forgetfulness for recent conversations. They deny forgetfulness for recent events, misplacing/losing items, repeating statements/questions, forgetting to take medications, forgetting appointments, word finding difficulty, difficulty concentrating or staying on task, comprehension problems or problems with navigation. The patient does not do much driving secondary to vision changes. He manages his medications and appointments with no difficulty. His wife has always managed the finances and  cooking. The patient has no known family history of dementia. His mother is still alive at age 91 and is just beginning to show some cognitive changes.  Physically, the patient reports feeling "fair". He and his wife walk daily, weather permitting. He does not have any chronic pain. He is no longer having any issues with balance or walking.   Psychiatric history was denied. He reported stable mood and denied any depression or anxiety. He denies past or present suicidal ideation. His wife agrees mood is stable. He does not have any sleep difficulty. He feels rested upon awakening. His wife notes he snores. He has never been tested for sleep apnea.   Social History: Born/Raised: [place] Education: High school Occupational history: Educational psychologist for a Engineer, maintenance ([employer]); retired approx 4 years ago Marital history: Married x 70 years, 2 daughters, 2 grandchildren Alcohol: None Tobacco: None   Medical History:  Past Medical History:  Diagnosis Date  . Constipation   . Hyperlipidemia   . Hypertension   . Prediabetes    no meds   . Vitamin D deficiency     Current medications:  Outpatient Encounter Medications as of 08/31/2018  Medication Sig  . ACCU-CHEK SOFTCLIX LANCETS lancets Check blood sugar 1 time a day.DX-R73.03  . Alcohol Swabs (B-D SINGLE USE SWABS REGULAR) PADS Check blood sugar 1 time daily-DX-R73.03  . Blood Glucose Monitoring Suppl (ACCU-CHEK AVIVA PLUS) w/Device KIT Check blood sugar 1 time a day-DX-R73.03  . Cholecalciferol (VITAMIN D3 PO) Take 2,500 Units by mouth daily. Take 1/2 tablet daily  . glucose blood (ACCU-CHEK AVIVA PLUS) test strip Check blood sugar 1 time a day-DX-R73.03  . Magnesium 250 MG TABS Take 250 mg by mouth daily.  . NONFORMULARY OR COMPOUNDED ITEM  Inject 0.4 mg into the skin as needed. Prostaglandin EL. 0.4 mm/20 mg as needed injected into the penis  . omeprazole (PRILOSEC) 20 MG capsule Take 1 capsule 2 x/day for acid reflux & indigestion  .  oxybutynin (DITROPAN-XL) 10 MG 24 hr tablet Take 1 tablet every morning for irritable bladder  . PAZEO 0.7 % SOLN   . Polyethylene Glycol 3350 (MIRALAX PO) Take 17 g/day by mouth daily.  . pravastatin (PRAVACHOL) 40 MG tablet TAKE 1 TABLET EVERY EVENING  . promethazine (PHENERGAN) 25 MG tablet Takes 1 tablet every 4 hours as needed for nausea or vomititng.  . promethazine-dextromethorphan (PROMETHAZINE-DM) 6.25-15 MG/5ML syrup Take 5 mLs by mouth 4 (four) times daily as needed for cough.  . terbinafine (LAMISIL) 250 MG tablet Take 1 tablet daily for skin fungus  . triamcinolone cream (KENALOG) 0.1 % Apply sparingly 2 to 3 x day (do not use on face)  . vitamin B-12 (CYANOCOBALAMIN) 500 MCG tablet Dissolve 1 tablet under tongue daily   No facility-administered encounter medications on file as of 08/31/2018.      Current Examination:  Behavioral Observations:  Appearance: Neatly and appropriately dressed and groomed Gait: Ambulated independently, no gross abnormalities observed Speech: Fluent; soft volume, mildly reduced rate, increased response latencies. No significant word finding difficulty. Thought process: Linear, goal directed Affect: Full, euthymic Interpersonal: Pleasant, appropriate Orientation: Oriented to person, place and most aspects of time (one year off on his current age). Accurately named the current President but could not name his predecessor.  Tests Administered: . Test of Premorbid Functioning (TOPF) . Wechsler Adult Intelligence Scale-Fourth Edition (WAIS-IV): Similarities, Music therapist, Coding and Digit Span subtests . Wechsler Memory Scale-Fourth Edition (WMS-IV) Older Adult Version (ages 44-90): Logical Memory I, II and Recognition subtests  . [employer] Verbal Learning Test - 2nd Edition (CVLT-2) Short Form . Repeatable Battery for the Assessment of Neuropsychological Status (RBANS) Form A:  Figure Copy and Recall subtests and Semantic Fluency subtest . [place]  Naming Test (BNT) . [place] Diagnostic Aphasia Examination: Complex Ideational Material subtest . Controlled Oral Word Association Test (COWAT) . Trail Making Test A and B . Clock drawing test . Geriatric Depression Scale (GDS) 15 Item . Generalized Anxiety Disorder - 7 item screener (GAD-7)  Test Results: Note: Standardized scores are presented only for use by appropriately trained professionals and to allow for any future test-retest comparison. These scores should not be interpreted without consideration of all the information that is contained in the rest of the report. The most recent standardization samples from the test publisher or other sources were used whenever possible to derive standard scores; scores were corrected for age, gender, ethnicity and education when available.   Test Scores:  Test Name Raw Score Standardized Score Descriptor  TOPF 26/70 SS= 86 Low average  WAIS-IV Subtests     Similarities 15/36 ss= 6 Low average  Block Design 4/66 ss= 2 Impaired  Coding 12/135 ss= 2 Impaired  Digit Span Forward 7/16 ss= 7 Low average  Digit Span Backward 5/16 ss= 6 Low average  WMS-IV Subtests     LM I 12/53 ss= 3 Impaired  LM II 2/39 ss= 3 Impaired  LM II Recognition 17/23 Cum %: 26-50   RBANS Subtests     Figure Copy 9/20 Z= -5 Severely impaired  Figure Recall 2/20 Z= -2.5 Impaired  Semantic Fluency 6 Z= -2.7 Severely impaired  CVLT-II Scores     Trial 1 4/9 Z= -1 Low average  Trial 4  4/9 Z= -2 Impaired  Trials 1-4 total 17/36 T= 33 Borderline  SD Free Recall 4/9 Z= -1.5 Borderline  LD Free Recall 3/9 Z= -1 Low average  LD Cued Recall 3/9 Z= -1.5 Borderline  Recognition Discriminability 7/9 hits 1 false positive Z= 0 Average  Forced Choice Recognition 9/9  WNL  BNT 39/60 T= 31 Borderline  BDAE Complex Ideational Material 8/12  Impaired  COWAT-FAS 8 T= 28 Impaired  COWAT-Animals 4 T= 21 Severely impaired  Trail Making Test A  259" 2 errors T= <25 Severely  impaired  Trail Making Test B Pt unable    Clock Drawing   Impaired  GDS-15 3/15  WNL  GAD-7 7/21  Mild      Description of Test Results:  Premorbid verbal intellectual abilities were estimated to have been within the low average range based on a test of word reading.   Psychomotor processing speed was severely impaired.   Auditory attention and working memory were low average.   Visual-spatial construction was severely impaired.   Language abilities were below expectation. Specifically, confrontation naming was borderline impaired, and semantic verbal fluency was severely impaired. Auditory comprehension of complex ideational material was impaired.   With regard to verbal memory, encoding and acquisition of non-contextual information (i.e., word list) was borderline. After a brief distracter task, free recall was borderline (4/9 items). After a delay, free recall was low average (3/9 items). Cued recall was borderline (3/9 items). Performance on a yes/no recognition task was average. On another verbal memory test, encoding and acquisition of contextual auditory information (i.e., short stories) was impaired. After a delay, free recall was impaired. Performance on a yes/no recognition task was mildly below expectation. With regard to non-verbal memory, delayed free recall of visual information was impaired.   Executive functioning was mostly below expectation. Mental flexibility and set-shifting were impaired; he was unable to complete Trails [address]. Verbal fluency with phonemic search restrictions was impaired. Verbal abstract reasoning was low average. Performance on a clock drawing task was impaired.   On a self-report measure of mood, the patient's responses were not indicative of clinically significant depression at the present time. On a self-report measure of anxiety, the patient endorsed mild generalized anxiety characterized by trouble relaxing, irritability, and mild nervousness,  inability to control worrying and excessive worries.    Clinical Impressions: Mild cognitive impairment, multi-domain. Cognitive testing suggests several areas of cognitive decline beyond what would be expected in the normal aging process. Specifically, performances were impaired on tests measuring processing speed, visual-spatial construction, language abilities, executive functioning, and delayed recall of information that was only presented once. Basic attention, learning and memory for information that was repeated, and verbal abstract reasoning were within normal limits. Test results are concerning for dementia; however, the patient and his wife deny any problems in managing complex ADLs (and they also deny having any cognitive concerns since stopping atenolol). As such, diagnostic criteria for a dementia syndrome are not met. However, a diagnosis of MCI, multidomain, is appropriate. His cognitive profile is reflective of both subcortical and cortical features.  He also reports mild generalized anxiety but no depression.     Recommendations/Plan: Based on the findings of the present evaluation, the following recommendations are offered:  1. Based on the level of impairment on testing and the possibility of underlying neurodegenerative process, cholinesterase inhibitor therapy could be considered if not medically contraindicated.  2. Optimal control of vascular risk factors is encouraged. 3. His wife should monitor medications to  ensure errors are not made. 4. Consideration of driving evaluation is recommended, given his impaired performance on a cognitive test highly correlated with driving ability. 5. Neurocognitive re-evaluation in 1 year to monitor cognitive status, track any progression of symptoms and further assist with treatment planning.   Feedback to Patient: Mathew Miller. returned for a feedback appointment on 08/21/2018 to review the results of his neuropsychological  evaluation with this provider. 20 minutes face-to-face time was spent reviewing his test results, my impressions and my recommendations as detailed above.    Total time spent on this patient's case: 50 minutes for neurobehavioral status exam with psychologist (CPT code [phone]); 90 minutes of testing/scoring by psychometrician under psychologist's supervision (CPT codes [phone], [phone] units); 180 minutes for integration of patient data, interpretation of standardized test results and clinical data, clinical decision making, treatment planning and preparation of this report, and interactive feedback with review of results to the patient/family by psychologist (CPT codes [phone], [phone] units).      Thank you for your referral of Mathew Miller.. Please feel free to contact me if you have any questions or concerns regarding this report.

## 2018-08-31 ENCOUNTER — Encounter: Payer: Self-pay | Admitting: Psychology

## 2018-08-31 ENCOUNTER — Ambulatory Visit (INDEPENDENT_AMBULATORY_CARE_PROVIDER_SITE_OTHER): Payer: Medicare HMO | Admitting: Psychology

## 2018-08-31 DIAGNOSIS — R413 Other amnesia: Secondary | ICD-10-CM

## 2018-08-31 DIAGNOSIS — G3184 Mild cognitive impairment, so stated: Secondary | ICD-10-CM

## 2018-09-21 ENCOUNTER — Encounter: Payer: Medicare HMO | Admitting: Psychology

## 2018-10-03 ENCOUNTER — Ambulatory Visit: Payer: Self-pay | Admitting: Physician Assistant

## 2018-10-03 ENCOUNTER — Ambulatory Visit: Payer: Self-pay | Admitting: Adult Health Nurse Practitioner

## 2018-10-04 ENCOUNTER — Ambulatory Visit: Payer: Self-pay | Admitting: Adult Health

## 2018-10-12 NOTE — Progress Notes (Signed)
MEDICARE ANNUAL WELLNESS VISIT AND FOLLOW UP Assessment:    Encounter for Medicare annual wellness exam 1 year  Essential hypertension - continue medications, DASH diet, exercise and monitor at home. Call if greater than 130/80.  -     CBC with Differential/Platelet -     COMPLETE METABOLIC PANEL WITH GFR -     TSH  Mixed hyperlipidemia check lipids decrease fatty foods increase activity.  -     Lipid panel  Medication management -     Magnesium  History of prediabetes Discussed disease progression and risks Discussed diet/exercise, weight management and risk modification  Vitamin D deficiency Continue supplement  Gastroesophageal reflux disease, esophagitis presence not specified Continue PPI/H2 blocker, diet discussed  BMI 24.0-24.9, adult -recommended diet heavy in fruits and veggies and low in animal meats, cheeses, and dairy products  Memory changes Imbalance Continue follow up neuro, continue B12 Control blood pressure, cholesterol, glucose, increase exercise.   B12 deficiency -     Vitamin B12 - 1000 3 x a week (takes 549m 2 pills)  Overactive bladder -     mirabegron ER (MYRBETRIQ) 50 MG TB24 tablet; Take 1 tablet (50 mg total) by mouth daily. - will stop the oxybutynin due to constipation, add magnesium    Wife requesting visit every 6 months  Future Appointments  Date Time Provider Mathew Miller 01/02/2019  4:00 PM Mathew Miller [facility] None  07/24/2019 10:00 AM Mathew Miller [facility] None     Plan:   During the course of the visit the patient was educated and counseled about appropriate screening and preventive services including:    Pneumococcal vaccine   Influenza vaccine  Td vaccine  Screening electrocardiogram  Colorectal cancer screening  Diabetes screening  Glaucoma screening  Nutrition counseling   Subjective:  Mathew Miller is a 72y.o. male who presents for Medicare Annual Wellness Visit  and 3 month follow up for HTN, hyperlipidemia, prediabetes, and vitamin D Def.   He had memory issues, had very low B12, on that now, he has seen neuro and had an MRI that showed chronic microvascular changes.   His blood pressure has been controlled at home, off atenolol and BP meds , today their BP is BP: 128/88 He does workout. He denies chest pain, shortness of breath, dizziness.  He is on cholesterol medication and denies myalgias. His cholesterol is at goal. The cholesterol last visit was:   Lab Results  Component Value Date   CHOL 168 06/26/2018   HDL 61 06/26/2018   LDLCALC 90 06/26/2018   TRIG 84 06/26/2018   CHOLHDL 2.8 06/26/2018   He has been working on diet and exercise for prediabetes, and denies paresthesia of the feet, polydipsia, polyuria and visual disturbances. Last A1C in the office was:  Lab Results  Component Value Date   HGBA1C 5.4 06/26/2018   Patient is on Vitamin D supplement.   Lab Results  Component Value Date   VD25OH 67 06/26/2018     BMI is Body mass index is 25.09 kg/m., he is working on diet and exercise. Wt Readings from Last 3 Encounters:  10/20/18 190 lb 3.2 oz (86.3 kg)  06/26/18 189 lb 9.6 oz (86 kg)  04/13/18 190 lb (86.2 kg)    Names of Other Physician/Practitioners you currently use: 1. [facility] here for primary care 2. Mathew Miller eye doctor, last visit 05/2018 3. [facility]w [facility] Patient Care Team: Mathew Miller as  PCP - General (Internal Medicine) Mathew Castle, Miller (Inactive) as Consulting Physician (Gastroenterology) Mathew Clines, Miller as Consulting Physician (Urology) Mathew Miller, [place] as Referring Physician (Optometry)  Medication Review: Current Outpatient Medications on File Prior to Visit  Medication Sig Dispense Refill  . ACCU-CHEK SOFTCLIX LANCETS lancets Check blood sugar 1 time a day.DX-R73.03 100 each 1  . Alcohol Swabs (B-D SINGLE USE SWABS REGULAR) PADS Check  blood sugar 1 time daily-DX-R73.03 100 each 4  . Blood Glucose Monitoring Suppl (ACCU-CHEK AVIVA PLUS) w/Device KIT Check blood sugar 1 time a day-DX-R73.03 1 kit 0  . Cholecalciferol (VITAMIN D3 PO) Take 2,500 Units by mouth daily. Take 1/2 tablet daily    . glucose blood (ACCU-CHEK AVIVA PLUS) test strip Check blood sugar 1 time a day-DX-R73.03 100 each 1  . Magnesium 250 MG TABS Take 250 mg by mouth daily.    . NONFORMULARY OR COMPOUNDED ITEM Inject 0.4 mg into the skin as needed. Prostaglandin EL. 0.4 mm/20 mg as needed injected into the penis    . omeprazole (PRILOSEC) 20 MG capsule Take 1 capsule 2 x/day for acid reflux & indigestion 180 capsule 3  . oxybutynin (DITROPAN-XL) 10 MG 24 hr tablet Take 1 tablet every morning for irritable bladder 90 tablet 3  . PAZEO 0.7 % SOLN     . pravastatin (PRAVACHOL) 40 MG tablet TAKE 1 TABLET EVERY EVENING 90 tablet 3  . promethazine (PHENERGAN) 25 MG tablet Takes 1 tablet every 4 hours as needed for nausea or vomititng. 100 tablet 0  . promethazine-dextromethorphan (PROMETHAZINE-DM) 6.25-15 MG/5ML syrup Take 5 mLs by mouth 4 (four) times daily as needed for cough. 473 mL 0  . triamcinolone cream (KENALOG) 0.1 % Apply sparingly 2 to 3 x day (do not use on face) 80 g 3  . vitamin B-12 (CYANOCOBALAMIN) 500 MCG tablet Dissolve 1 tablet under tongue daily 90 tablet 3   No current facility-administered medications on file prior to visit.     Current Problems (verified) Patient Active Problem List   Diagnosis Date Noted  . Imbalance 02/27/2018  . BMI 24.0-24.9, adult 02/26/2018  . Acid reflux 02/26/2018  . Memory changes 02/26/2018  . Vitamin D deficiency 03/01/2014  . Medication management 03/01/2014  . Hyperlipidemia   . History of prediabetes   . HTN (hypertension) 10/09/2013    Screening Tests Immunization History  Administered Date(s) Administered  . Influenza Split 08/06/2013  . Influenza, High Dose Seasonal PF 07/30/2014, 07/29/2015,  08/11/2016, 07/20/2017  . Influenza-Unspecified 07/18/2018  . Pneumococcal Conjugate-13 04/23/2016  . Pneumococcal Polysaccharide-23 10/21/2009  . Td 10/18/1965  . Zoster 10/19/2011  . Zoster Recombinat (Shingrix) 03/18/2018, 06/09/2018   Preventative care: Last colonoscopy: 09/04/2014 Dr. Deatra Miller MRI brain 01/19/2018  Prior vaccinations: TD or Tdap: allergy  Influenza: 2018 Pneumococcal: 2011 Prevnar 2017 Shingles/Zostavax: 2013  History reviewed: allergies, current medications, past family history, past medical history, past social history, past surgical history and problem list  Allergies Allergies  Allergen Reactions  . Hydrocodone-Acetaminophen Nausea Only  . Sudafed [Pseudoephedrine Hcl]     Urinary retention  . Tetanus Toxoids     Itching, burning feeling, ran a fever    SURGICAL HISTORY He  has a past surgical history that includes No past surgeries; Colonoscopy; and Cataract extraction, bilateral (2017). FAMILY HISTORY His family history includes Glaucoma in his mother; Heart disease in his father; Hyperlipidemia in his mother; Hypertension in his father. SOCIAL HISTORY He  reports that he has never smoked. He has  never used smokeless tobacco. He reports current alcohol use. He reports that he does not use drugs.  MEDICARE WELLNESS OBJECTIVES: Physical activity: Current Exercise Habits: Home exercise routine, Type of exercise: walking Cardiac risk factors: Cardiac Risk Factors include: advanced age (>39mn, >>7women);dyslipidemia;hypertension;sedentary lifestyle;male gender Depression/mood screen:   [facility]  Decreased Interest 0  Down, Depressed, Hopeless 0  PHQ - 2 Score 0    ADLs:  In your present state of health, do you have any difficulty performing the following activities: 10/20/2018 06/26/2018  Hearing? N Y  Comment - bilat hearing aids  Vision? N N  Difficulty concentrating or making decisions? Y N  Comment has improved, has  seen Dr. JTomi Likens-  Walking or climbing stairs? N N  Dressing or bathing? N N  Doing errands, shopping? Y N  Comment wife is driving -  PConservation officer, natureand eating ? Y -  Using the Toilet? N -  In the past six months, have you accidently leaked urine? N -  Do you have problems with loss of bowel control? N -  Managing your Medications? Y -  Comment wife helps -  Managing your Finances? N -  Housekeeping or managing your Housekeeping? Y -  Some recent data might be hidden     Cognitive Testing  Alert? Yes  Normal Appearance?Yes  Oriented to person? Yes  Place? Yes   Time? Yes  Recall of three objects?  Yes  Can perform simple calculations? Yes  Displays appropriate judgment?Yes  Can read the correct time from a watch face?Yes  EOL planning: Does Patient Have a Medical Advance Directive?: Yes [place]ive: [place] Does patient want to make changes to medical advance directive?: No - Patient declined  Objective:   Blood pressure 128/88, pulse 64, temperature 97.9 F (36.6 C), resp. rate 16, height 6' 1"  (1.854 m), weight 190 lb 3.2 oz (86.3 kg). Body mass index is 25.09 kg/m.  General appearance: alert, no distress, WD/WN, male HEENT: normocephalic, sclerae anicteric, TMs pearly, nares patent, no discharge or erythema, pharynx normal Oral cavity: MMM, no lesions Neck: supple, no lymphadenopathy, no thyromegaly, no masses Heart: RRR, normal S1, S2, no murmurs Lungs: CTA bilaterally, no wheezes, rhonchi, or rales Abdomen: +bs, soft, non tender, non distended, no masses, no hepatomegaly, no splenomegaly Musculoskeletal: nontender, no swelling, no obvious deformity Extremities: no edema, no cyanosis, no clubbing Pulses: 2+ symmetric, upper and lower extremities, normal cap refill Neurological: alert, oriented x 3, CN2-12 intact, strength normal upper extremities and lower extremities, sensation normal throughout, DTRs 2+ throughout, no  cerebellar signs, no tremor, gait slow.  Psychiatric: flat affect, behavior normal, pleasant   Medicare Attestation I have personally reviewed: The patient's medical and social history Their use of alcohol, tobacco or illicit drugs Their current medications and supplements The patient's functional ability including ADLs,fall risks, home safety risks, cognitive, and hearing and visual impairment Diet and physical activities Evidence for depression or mood disorders  The patient's weight, height, BMI, and visual acuity have been recorded in the chart.  I have made referrals, counseling, and provided education to the patient based on review of the above and I have provided the patient with a written personalized care plan for preventive services.     AVicie Mutters PA-C   10/20/2018

## 2018-10-20 ENCOUNTER — Ambulatory Visit (INDEPENDENT_AMBULATORY_CARE_PROVIDER_SITE_OTHER): Payer: Medicare HMO | Admitting: Physician Assistant

## 2018-10-20 ENCOUNTER — Encounter: Payer: Self-pay | Admitting: Physician Assistant

## 2018-10-20 VITALS — BP 128/88 | HR 64 | Temp 97.9°F | Resp 16 | Ht 73.0 in | Wt 190.2 lb

## 2018-10-20 DIAGNOSIS — E559 Vitamin D deficiency, unspecified: Secondary | ICD-10-CM

## 2018-10-20 DIAGNOSIS — R413 Other amnesia: Secondary | ICD-10-CM

## 2018-10-20 DIAGNOSIS — Z79899 Other long term (current) drug therapy: Secondary | ICD-10-CM

## 2018-10-20 DIAGNOSIS — I10 Essential (primary) hypertension: Secondary | ICD-10-CM | POA: Diagnosis not present

## 2018-10-20 DIAGNOSIS — Z87898 Personal history of other specified conditions: Secondary | ICD-10-CM

## 2018-10-20 DIAGNOSIS — Z6824 Body mass index (BMI) 24.0-24.9, adult: Secondary | ICD-10-CM | POA: Diagnosis not present

## 2018-10-20 DIAGNOSIS — K219 Gastro-esophageal reflux disease without esophagitis: Secondary | ICD-10-CM

## 2018-10-20 DIAGNOSIS — N3281 Overactive bladder: Secondary | ICD-10-CM

## 2018-10-20 DIAGNOSIS — E538 Deficiency of other specified B group vitamins: Secondary | ICD-10-CM | POA: Diagnosis not present

## 2018-10-20 DIAGNOSIS — R6889 Other general symptoms and signs: Secondary | ICD-10-CM

## 2018-10-20 DIAGNOSIS — Z0000 Encounter for general adult medical examination without abnormal findings: Secondary | ICD-10-CM

## 2018-10-20 DIAGNOSIS — E782 Mixed hyperlipidemia: Secondary | ICD-10-CM

## 2018-10-20 DIAGNOSIS — Z0001 Encounter for general adult medical examination with abnormal findings: Secondary | ICD-10-CM | POA: Diagnosis not present

## 2018-10-20 DIAGNOSIS — R2689 Other abnormalities of gait and mobility: Secondary | ICD-10-CM

## 2018-10-20 MED ORDER — SENNOSIDES-DOCUSATE SODIUM 8.6-50 MG PO TABS: 1.0000 | ORAL_TABLET | Freq: Every day | ORAL | 1 refills | Status: DC

## 2018-10-20 MED ORDER — MIRABEGRON ER 50 MG PO TB24: 50.0000 mg | ORAL_TABLET | Freq: Every day | ORAL | 0 refills | Status: DC

## 2018-10-20 NOTE — Patient Instructions (Addendum)
Continue miralax Add on senokot or stool softner Can add on magnesium 2 pills or 500mg  without food Increase water and exercise  If it is bad you can do milk of magnesia but do not do this consistently  Please go to the ER if you have any severe AB pain, unable to hold down food/water, blood in stool or vomit, chest pain, shortness of breath, or any worsening symptoms.   The oxybutyinin can cause constipation- stop this for now Start myrbetriq samples 25 mg and then the 50 mg   Mathew Miller , Thank you for taking time to come for your Medicare Wellness Visit. I appreciate your ongoing commitment to your health goals. Please review the following plan we discussed and let me know if I can assist you in the future.   This is a list of the screening recommended for you and due dates:  Health Maintenance  Topic Date Due  .  Hepatitis C: One time screening is recommended by Center for Disease Control  (CDC) for  adults born from [birth year] through 1967.   08/31/2023*  . Tetanus Vaccine  08/26/2027*  . Colon Cancer Screening  09/05/2019  . Flu Shot  Completed  . Pneumonia vaccines  Completed  *Topic was postponed. The date shown is not the original due date.    If you have been in the ER, hospital, or long term care facility, we want to see you within one week of discharge so please CONTACT OUR OFFICE at [phone].  We will try to contact you if we know about your admission/visit but we would love for you to contact us first.  We know your history and want to make sure all of your questions are answered and needs are taken care of after admission.   We also want our patients to know that we do NOT approve of [facility] soliciting our patients to do home visits and we do NOT approve of [facility] screenings. Please contact us before doing either of these "services".

## 2018-10-21 LAB — CBC WITH DIFFERENTIAL/PLATELET
Absolute Monocytes: 715 cells/uL (ref 200–950)
Basophils Absolute: 49 cells/uL (ref 0–200)
Basophils Relative: 0.5 %
Eosinophils Absolute: 49 cells/uL (ref 15–500)
Eosinophils Relative: 0.5 %
HCT: 46.5 % (ref 38.5–50.0)
Hemoglobin: 15.5 g/dL (ref 13.2–17.1)
Lymphs Abs: 1431 cells/uL (ref 850–3900)
MCH: 29.9 pg (ref 27.0–33.0)
MCHC: 33.3 g/dL (ref 32.0–36.0)
MCV: 89.8 fL (ref 80.0–100.0)
MPV: 10.4 fL (ref 7.5–12.5)
Monocytes Relative: 7.3 %
Neutro Abs: 7556 cells/uL (ref 1500–7800)
Neutrophils Relative %: 77.1 %
Platelets: 195 10*3/uL (ref 140–400)
RBC: 5.18 10*6/uL (ref 4.20–5.80)
RDW: 12.9 % (ref 11.0–15.0)
Total Lymphocyte: 14.6 %
WBC: 9.8 10*3/uL (ref 3.8–10.8)

## 2018-10-21 LAB — LIPID PANEL
Cholesterol: 156 mg/dL (ref <200)
HDL: 57 mg/dL (ref >40)
LDL Cholesterol (Calc): 84 mg/dL (calc)
Non-HDL Cholesterol (Calc): 99 mg/dL (calc) (ref <130)
Total CHOL/HDL Ratio: 2.7 (calc) (ref <5.0)
Triglycerides: 69 mg/dL (ref <150)

## 2018-10-21 LAB — COMPLETE METABOLIC PANEL WITH GFR
AG Ratio: 2 (calc) (ref 1.0–2.5)
ALT: 14 U/L (ref 9–46)
AST: 15 U/L (ref 10–35)
Albumin: 4.4 g/dL (ref 3.6–5.1)
Alkaline phosphatase (APISO): 80 U/L (ref 40–115)
BUN/Creatinine Ratio: 17 (calc) (ref 6–22)
BUN: 21 mg/dL (ref 7–25)
CO2: 28 mmol/L (ref 20–32)
Calcium: 9.4 mg/dL (ref 8.6–10.3)
Chloride: 105 mmol/L (ref 98–110)
Creat: 1.23 mg/dL — ABNORMAL HIGH (ref 0.70–1.18)
GFR, Est African American: 67 mL/min/{1.73_m2} (ref >=60)
GFR, Est Non African American: 57 mL/min/{1.73_m2} — ABNORMAL LOW (ref >=60)
Globulin: 2.2 g/dL (calc) (ref 1.9–3.7)
Glucose, Bld: 93 mg/dL (ref 65–99)
Potassium: 4.5 mmol/L (ref 3.5–5.3)
Sodium: 140 mmol/L (ref 135–146)
Total Bilirubin: 0.7 mg/dL (ref 0.2–1.2)
Total Protein: 6.6 g/dL (ref 6.1–8.1)

## 2018-10-21 LAB — MAGNESIUM: Magnesium: 2.4 mg/dL (ref 1.5–2.5)

## 2018-10-21 LAB — VITAMIN B12: Vitamin B-12: 1839 pg/mL — ABNORMAL HIGH (ref 200–1100)

## 2018-10-21 LAB — TSH: TSH: 3.05 mIU/L (ref 0.40–4.50)

## 2018-10-31 ENCOUNTER — Other Ambulatory Visit: Payer: Self-pay | Admitting: *Deleted

## 2018-10-31 DIAGNOSIS — Z1211 Encounter for screening for malignant neoplasm of colon: Secondary | ICD-10-CM | POA: Diagnosis not present

## 2018-10-31 DIAGNOSIS — Z1212 Encounter for screening for malignant neoplasm of rectum: Principal | ICD-10-CM

## 2018-10-31 LAB — POC HEMOCCULT BLD/STL (HOME/3-CARD/SCREEN)
Card #2 Fecal Occult Blod, POC: NEGATIVE
Card #3 Fecal Occult Blood, POC: NEGATIVE
Fecal Occult Blood, POC: NEGATIVE

## 2018-11-14 DIAGNOSIS — N3281 Overactive bladder: Secondary | ICD-10-CM

## 2018-11-14 MED ORDER — MIRABEGRON ER 50 MG PO TB24: 50.0000 mg | ORAL_TABLET | Freq: Every day | ORAL | 3 refills | Status: DC

## 2018-12-15 ENCOUNTER — Ambulatory Visit: Payer: Medicare HMO | Admitting: Neurology

## 2019-01-02 ENCOUNTER — Ambulatory Visit: Payer: Self-pay | Admitting: Internal Medicine

## 2019-01-04 ENCOUNTER — Other Ambulatory Visit: Payer: Self-pay | Admitting: Internal Medicine

## 2019-01-11 ENCOUNTER — Ambulatory Visit: Payer: Medicare HMO | Admitting: Neurology

## 2019-02-16 DIAGNOSIS — N3281 Overactive bladder: Secondary | ICD-10-CM

## 2019-02-17 MED ORDER — MIRABEGRON ER 50 MG PO TB24: 50.0000 mg | ORAL_TABLET | Freq: Every day | ORAL | 3 refills | Status: DC

## 2019-02-22 DIAGNOSIS — Z20828 Contact with and (suspected) exposure to other viral communicable diseases: Secondary | ICD-10-CM | POA: Diagnosis not present

## 2019-02-28 ENCOUNTER — Encounter: Payer: Self-pay | Admitting: Physician Assistant

## 2019-02-28 ENCOUNTER — Other Ambulatory Visit: Payer: Self-pay

## 2019-02-28 ENCOUNTER — Telehealth: Payer: Medicare HMO | Admitting: Physician Assistant

## 2019-02-28 VITALS — BP 134/72 | HR 78 | Temp 96.1°F | Ht 73.0 in | Wt 185.5 lb

## 2019-02-28 DIAGNOSIS — I743 Embolism and thrombosis of arteries of the lower extremities: Secondary | ICD-10-CM | POA: Diagnosis not present

## 2019-02-28 DIAGNOSIS — R55 Syncope and collapse: Secondary | ICD-10-CM

## 2019-02-28 NOTE — Progress Notes (Signed)
Patient's wife reports  Deep red spots on LEFT FEET----1 wk  And 3 days ago 2 more spots showed up.  Spots are hard, with no pain to report

## 2019-02-28 NOTE — Progress Notes (Signed)
THIS ENCOUNTER IS A VIRTUAL VISIT DUE TO COVID-19 - PATIENT WAS NOT SEEN IN THE OFFICE.  PATIENT HAS CONSENTED TO VIRTUAL VISIT / TELEMEDICINE VISIT   Virtual Visit via video Note  I connected with Mathew Miller. on 02/28/2019   by video.  I verified that I am speaking with the correct person using two identifiers.    I discussed the limitations of evaluation and management by telemedicine and the availability of in person appointments. The patient expressed understanding and agreed to proceed.  History of Present Illness: 73 y.o. WM with history of HTN (hypertension); Hyperlipidemia; History of prediabetes; Vitamin D deficiency; Medication management; BMI 24.0-24.9, adult; Acid reflux; Memory changes; and Imbalance call with 5 spots on his left foot.  States 1 week ago started with deep red spots on his left toe, hard without pain. Tried triamcinolone for 3 days, then noticed 2 more spots on different toes.  Raised burgundy areas He has not been outside bare foot.  No new shoes, no change in detergent.     Lab Results  Component Value Date   GFRNONAA 57 (L) 10/20/2018    Medications   Current Outpatient Medications (Cardiovascular):  .  pravastatin (PRAVACHOL) 40 MG tablet, TAKE 1 TABLET EVERY EVENING  Current Outpatient Medications (Respiratory):  .  promethazine (PHENERGAN) 25 MG tablet, Takes 1 tablet every 4 hours as needed for nausea or vomititng. .  promethazine-dextromethorphan (PROMETHAZINE-DM) 6.25-15 MG/5ML syrup, Take 5 mLs by mouth 4 (four) times daily as needed for cough.   Current Outpatient Medications (Hematological):  .  vitamin B-12 (CYANOCOBALAMIN) 500 MCG tablet, Dissolve 1 tablet under tongue daily  Current Outpatient Medications (Other):  Marland Kitchen  ACCU-CHEK SOFTCLIX LANCETS lancets, Check blood sugar 1 time a day.DX-R73.03 .  Alcohol Swabs (B-D SINGLE USE SWABS REGULAR) PADS, Check blood sugar 1 time daily-DX-R73.03 .  Blood Glucose Monitoring Suppl  (ACCU-CHEK AVIVA PLUS) w/Device KIT, Check blood sugar 1 time a day-DX-R73.03 .  Cholecalciferol (VITAMIN D3 PO), Take 2,500 Units by mouth daily. Take 1/2 tablet daily .  glucose blood (ACCU-CHEK AVIVA PLUS) test strip, Check blood sugar 1 time a day-DX-R73.03 .  Magnesium 250 MG TABS, Take 250 mg by mouth daily. .  mirabegron ER (MYRBETRIQ) 50 MG TB24 tablet, Take 1 tablet (50 mg total) by mouth daily. Marland Kitchen  omeprazole (PRILOSEC) 20 MG capsule, Take 1 capsule 2 x/day for acid reflux & indigestion .  oxybutynin (DITROPAN-XL) 10 MG 24 hr tablet, Take 1 tablet every morning for irritable bladder .  PAZEO 0.7 % SOLN,  .  senna-docusate (SENOKOT-S) 8.6-50 MG tablet, Take 1 tablet by mouth daily. Marland Kitchen  triamcinolone cream (KENALOG) 0.1 %, Apply sparingly 2 to 3 x day (do not use on face)  Problem list He has HTN (hypertension); Hyperlipidemia; History of prediabetes; Vitamin D deficiency; Medication management; BMI 24.0-24.9, adult; Acid reflux; Memory changes; and Imbalance on their problem list.   Observations/Objective: General Appearance: Well developed, in no apparent distress.  Eyes: conjunctiva no swelling or erythema ENT/Mouth: No hoarseness, No cough for duration of visit.  Neck: Supple  Respiratory: Respiratory effort normal, normal rate, no retractions or distress.   Cardio: Appears well-perfused, noncyanotic Musculoskeletal: no obvious deformity Skin: left toe with 2 erythematous areas on 3rd toe, 1 area on great toe at tip, and one area on pinky toe.  Neuro: Awake and oriented X 3,  Psych:  normal affect, Insight and Judgment appropriate.    Assessment and Plan: Diagnoses and all  orders for this visit:  Embolic disease of toe ([place]) -     CT ANGIO LOWER EXT BILAT W &/OR WO CONTRAST; Future -     ECHOCARDIOGRAM COMPLETE; Future  Embolism and thrombosis of artery of lower extremity ([facility]) -     CT ANGIO LOWER EXT BILAT W &/OR WO CONTRAST; Future   Increase ASA to 32m Go to the  ER if any chest pain, shortness of breath, nausea, dizziness, severe HA, changes vision/speech If negative have follow up in the office for lab work evaluation  Follow Up Instructions:    I discussed the assessment and treatment plan with the patient. The patient was provided an opportunity to ask questions and all were answered. The patient agreed with the plan and demonstrated an understanding of the instructions.   The patient was advised to call back or seek an in-person evaluation if the symptoms worsen or if the condition fails to improve as anticipated.  I provided 20 minutes of non-face-to-face time during this encounter.   AVicie Mutters PA-C

## 2019-02-28 NOTE — Addendum Note (Signed)
Addended by: Vicie Mutters R on: 02/28/2019 04:54 PM   Modules accepted: Orders

## 2019-02-28 NOTE — Progress Notes (Signed)
Wife called back to say last week he was outside, it was warmer, he walked across the deck and he sat on the steps and slid to the ground for 3 mins, no shaking, no loss of bladder control. He was weak afterwards but at his baseline. No SOB, CP, palpitations.

## 2019-03-01 ENCOUNTER — Telehealth: Payer: Self-pay | Admitting: *Deleted

## 2019-03-01 ENCOUNTER — Other Ambulatory Visit: Payer: Medicare HMO

## 2019-03-01 ENCOUNTER — Other Ambulatory Visit: Payer: Self-pay

## 2019-03-01 DIAGNOSIS — I743 Embolism and thrombosis of arteries of the lower extremities: Secondary | ICD-10-CM | POA: Diagnosis not present

## 2019-03-01 NOTE — Telephone Encounter (Signed)
[employer] to ship 3 day long term holter monitor to their home via [employer].  This is a replacement for the ordered 48 hour holter monitors, which are not being used during COVID-19 restrictions.  Patient may remove after 48 hour but could also wear up to 72 hours. Instructions reviewed briefly as they are included in the monitor kit. Patient may contact [employer] at [phone] if they have questions once they receive the monitor.

## 2019-03-02 LAB — CBC WITH DIFFERENTIAL/PLATELET
Absolute Monocytes: 886 cells/uL (ref 200–950)
Basophils Absolute: 54 cells/uL (ref 0–200)
Basophils Relative: 0.5 %
Eosinophils Absolute: 65 cells/uL (ref 15–500)
Eosinophils Relative: 0.6 %
HCT: 45.2 % (ref 38.5–50.0)
Hemoglobin: 15.2 g/dL (ref 13.2–17.1)
Lymphs Abs: 1210 cells/uL (ref 850–3900)
MCH: 30.5 pg (ref 27.0–33.0)
MCHC: 33.6 g/dL (ref 32.0–36.0)
MCV: 90.6 fL (ref 80.0–100.0)
MPV: 10.6 fL (ref 7.5–12.5)
Monocytes Relative: 8.2 %
Neutro Abs: 8586 cells/uL — ABNORMAL HIGH (ref 1500–7800)
Neutrophils Relative %: 79.5 %
Platelets: 195 10*3/uL (ref 140–400)
RBC: 4.99 10*6/uL (ref 4.20–5.80)
RDW: 13.1 % (ref 11.0–15.0)
Total Lymphocyte: 11.2 %
WBC: 10.8 10*3/uL (ref 3.8–10.8)

## 2019-03-02 LAB — COMPLETE METABOLIC PANEL WITH GFR
AG Ratio: 2 (calc) (ref 1.0–2.5)
ALT: 13 U/L (ref 9–46)
AST: 13 U/L (ref 10–35)
Albumin: 4 g/dL (ref 3.6–5.1)
Alkaline phosphatase (APISO): 83 U/L (ref 35–144)
BUN/Creatinine Ratio: 14 (calc) (ref 6–22)
BUN: 18 mg/dL (ref 7–25)
CO2: 29 mmol/L (ref 20–32)
Calcium: 9.3 mg/dL (ref 8.6–10.3)
Chloride: 105 mmol/L (ref 98–110)
Creat: 1.32 mg/dL — ABNORMAL HIGH (ref 0.70–1.18)
GFR, Est African American: 61 mL/min/{1.73_m2} (ref >=60)
GFR, Est Non African American: 52 mL/min/{1.73_m2} — ABNORMAL LOW (ref >=60)
Globulin: 2 g/dL (calc) (ref 1.9–3.7)
Glucose, Bld: 100 mg/dL — ABNORMAL HIGH (ref 65–99)
Potassium: 4 mmol/L (ref 3.5–5.3)
Sodium: 143 mmol/L (ref 135–146)
Total Bilirubin: 0.6 mg/dL (ref 0.2–1.2)
Total Protein: 6 g/dL — ABNORMAL LOW (ref 6.1–8.1)

## 2019-03-02 LAB — SEDIMENTATION RATE: Sed Rate: 2 mm/h (ref 0–20)

## 2019-03-02 LAB — C-REACTIVE PROTEIN: CRP: 2.2 mg/L (ref <8.0)

## 2019-03-06 ENCOUNTER — Ambulatory Visit (INDEPENDENT_AMBULATORY_CARE_PROVIDER_SITE_OTHER): Payer: Medicare HMO

## 2019-03-06 DIAGNOSIS — R55 Syncope and collapse: Secondary | ICD-10-CM | POA: Diagnosis not present

## 2019-03-07 NOTE — Progress Notes (Signed)
FOLLOW UP  Assessment and Plan:   Essential hypertension - continue medications, DASH diet, exercise and monitor at home. Call if greater than 130/80.   Imbalance -     Ambulatory referral to [place] Will send out to his house, difficult for patient to leave the home due to dementia  Mixed hyperlipidemia -     rosuvastatin (CRESTOR) 5 MG tablet; Take 1 tablet (5 mg total) by mouth at bedtime. Stop pravastatin, switch to crestor, goal below 70  Memory changes -     rosuvastatin (CRESTOR) 5 MG tablet; Take 1 tablet (5 mg total) by mouth at bedtime. Continue to follow up neuro  Newly recognized heart murmur Has holter on right now New systolic murmur best heard at apex-? MR No radiation to carotids  Rash? Though possible vascular but they are improving, no other symptoms Will proceed with echo/holter first, avoid CT for now due to cost, patient and wife preference.   Continue diet and meds as discussed. Further disposition pending results of labs. Discussed med's effects and SE's.   Over 30 minutes of exam, counseling, chart review, and critical decision making was performed.   Future Appointments  Date Time Provider [place]  03/26/2019 11:30 AM Vicie Mutters, PA-C [facility] None  07/24/2019 10:00 AM Unk Pinto, MD [facility] None  10/24/2019 11:15 AM Vicie Mutters, PA-C [facility] None    ----------------------------------------------------------------------------------------------------------------------  HPI 73 y.o. male  presents for 3 month follow up on hypertension, cholesterol, glucose managment, weight and vitamin D deficiency.  He also complains of worsening leg weakness with walking, 1 fall.He has a history of unsteady gait, walks with a cane. He has done PT in the past and would like to do it again.   wife states that he "passed out" on the deck for several minutes, no injury, has happened once but he will get episodes of not feeling well. .   He had red spots that came up on his feet, here for evaluation. Has been putting hydrocortisone cream on the spots and they have resolved, no pain in his legs. No other rash anywhere. He has some white/red on his toes with the cold.  He is wearing a holter monitor right now.     He was recently evaluated by Dr. Tomi Likens for dementia after noting some mild memory changes; MMSE scored 21/30.  Brain MRI 01/2018 showed mild chronic changes.   BMI is Body mass index is 24.09 kg/m., he has been working on diet and exercise. Wt Readings from Last 3 Encounters:  03/08/19 182 lb 9.6 oz (82.8 kg)  02/28/19 185 lb 8 oz (84.1 kg)  10/20/18 190 lb 3.2 oz (86.3 kg)    He is currently taking telmisartan 20 mg daily at night.Today his BP is BP: 130/74  He does workout. He denies chest pain, shortness of breath, dizziness.   He is on cholesterol medication (pravastatin 20 mg daily) and denies myalgias. His cholesterol is at goal. The cholesterol last visit was:  Lab Results  Component Value Date   CHOL 156 10/20/2018   HDL 57 10/20/2018   LDLCALC 84 10/20/2018   TRIG 69 10/20/2018   CHOLHDL 2.7 10/20/2018    He has been working on diet and exercise for glucose management (hx of prediabetes), and denies foot ulcerations, increased appetite, nausea, paresthesia of the feet, polydipsia, polyuria, visual disturbances, vomiting and weight loss. Last A1C in the office was:  Lab Results  Component Value Date   HGBA1C 5.4 06/26/2018  Patient is on Vitamin D supplement and at goal at recent check:    Lab Results  Component Value Date   VD25OH 67 06/26/2018        Current Medications:  Current Outpatient Medications on File Prior to Visit  Medication Sig  . ACCU-CHEK SOFTCLIX LANCETS lancets Check blood sugar 1 time a day.DX-R73.03  . Alcohol Swabs (B-D SINGLE USE SWABS REGULAR) PADS Check blood sugar 1 time daily-DX-R73.03  . Blood Glucose Monitoring Suppl (ACCU-CHEK AVIVA PLUS) w/Device KIT Check  blood sugar 1 time a day-DX-R73.03  . Cholecalciferol (VITAMIN D3 PO) Take 2,500 Units by mouth daily. Take 1/2 tablet daily  . glucose blood (ACCU-CHEK AVIVA PLUS) test strip Check blood sugar 1 time a day-DX-R73.03  . Magnesium 250 MG TABS Take 250 mg by mouth daily.  . mirabegron ER (MYRBETRIQ) 50 MG TB24 tablet Take 1 tablet (50 mg total) by mouth daily.  Marland Kitchen omeprazole (PRILOSEC) 20 MG capsule Take 1 capsule 2 x/day for acid reflux & indigestion  . oxybutynin (DITROPAN-XL) 10 MG 24 hr tablet Take 1 tablet every morning for irritable bladder  . PAZEO 0.7 % SOLN   . pravastatin (PRAVACHOL) 40 MG tablet TAKE 1 TABLET EVERY EVENING  . promethazine (PHENERGAN) 25 MG tablet Takes 1 tablet every 4 hours as needed for nausea or vomititng.  . promethazine-dextromethorphan (PROMETHAZINE-DM) 6.25-15 MG/5ML syrup Take 5 mLs by mouth 4 (four) times daily as needed for cough.  . senna-docusate (SENOKOT-S) 8.6-50 MG tablet Take 1 tablet by mouth daily.  Marland Kitchen triamcinolone cream (KENALOG) 0.1 % Apply sparingly 2 to 3 x day (do not use on face)  . vitamin B-12 (CYANOCOBALAMIN) 500 MCG tablet Dissolve 1 tablet under tongue daily   No current facility-administered medications on file prior to visit.      Allergies:  Allergies  Allergen Reactions  . Hydrocodone-Acetaminophen Nausea Only  . Sudafed [Pseudoephedrine Hcl]     Urinary retention  . Tetanus Toxoids     Itching, burning feeling, ran a fever     Medical History:  Past Medical History:  Diagnosis Date  . Constipation   . Hyperlipidemia   . Hypertension   . Prediabetes    no meds   . Vitamin D deficiency    Family history- Reviewed and unchanged Social history- Reviewed and unchanged   Review of Systems:  Review of Systems  Constitutional: Positive for malaise/fatigue. Negative for weight loss.  HENT: Negative for hearing loss and tinnitus.   Eyes: Negative for blurred vision and double vision.  Respiratory: Negative for cough,  shortness of breath and wheezing.   Cardiovascular: Negative for chest pain, palpitations, orthopnea, claudication, leg swelling and PND.  Gastrointestinal: Negative for abdominal pain, blood in stool, constipation, diarrhea, heartburn, melena, nausea and vomiting.  Genitourinary: Negative.   Musculoskeletal: Negative for falls, joint pain and myalgias.  Skin: Negative for rash.  Neurological: Positive for loss of consciousness (? presyncopal spells) and weakness (bilateral legs). Negative for dizziness, tingling, sensory change, seizures and headaches.  Endo/Heme/Allergies: Negative for polydipsia.  Psychiatric/Behavioral: Positive for memory loss. Negative for depression, hallucinations, substance abuse and suicidal ideas. The patient is nervous/anxious. The patient does not have insomnia.   All other systems reviewed and are negative.   Physical Exam: BP 130/74   Pulse 88   Temp 97.6 F (36.4 C)   Ht 6' 1"  (1.854 m)   Wt 182 lb 9.6 oz (82.8 kg)   SpO2 99%   BMI 24.09 kg/m  Wt  Readings from Last 3 Encounters:  03/08/19 182 lb 9.6 oz (82.8 kg)  02/28/19 185 lb 8 oz (84.1 kg)  10/20/18 190 lb 3.2 oz (86.3 kg)   General Appearance: Well nourished, in no apparent distress. Eyes: PERRLA, EOMs, conjunctiva no swelling or erythema Sinuses: No Frontal/maxillary tenderness ENT/Mouth: Ext aud canals clear, TMs without erythema, bulging. No erythema, swelling, or exudate on post pharynx.  Tonsils not swollen or erythematous. Hearing decreased Neck: Supple, thyroid normal.  Respiratory: Respiratory effort normal, BS equal bilaterally without rales, rhonchi, wheezing or stridor.  Cardio: RRR with new systolic holosystolic murmur heard best at apex. Brisk peripheral pulses without edema, feet are cold with very little hair. Abdomen: Soft, + BS.  Non tender, no guarding, rebound, hernias, masses. Lymphatics: Non tender without lymphadenopathy.  Musculoskeletal: Full ROM, symmetrical strength,   Gait slow with cane.  Skin: Warm, dry without rashes, lesions, ecchymosis.  Neuro: Cranial nerves intact. No cerebellar symptoms.  Psych: Awake and oriented X 3, normal affect, Insight and Judgment appropriate.    Vicie Mutters, PA-C 11:42 AM [facility]

## 2019-03-08 ENCOUNTER — Other Ambulatory Visit: Payer: Self-pay

## 2019-03-08 ENCOUNTER — Ambulatory Visit (INDEPENDENT_AMBULATORY_CARE_PROVIDER_SITE_OTHER): Payer: Medicare HMO | Admitting: Physician Assistant

## 2019-03-08 ENCOUNTER — Encounter: Payer: Self-pay | Admitting: Physician Assistant

## 2019-03-08 VITALS — BP 130/74 | HR 88 | Temp 97.6°F | Ht 73.0 in | Wt 182.6 lb

## 2019-03-08 DIAGNOSIS — R413 Other amnesia: Secondary | ICD-10-CM

## 2019-03-08 DIAGNOSIS — E782 Mixed hyperlipidemia: Secondary | ICD-10-CM

## 2019-03-08 DIAGNOSIS — Z79899 Other long term (current) drug therapy: Secondary | ICD-10-CM

## 2019-03-08 DIAGNOSIS — I10 Essential (primary) hypertension: Secondary | ICD-10-CM

## 2019-03-08 DIAGNOSIS — R011 Cardiac murmur, unspecified: Secondary | ICD-10-CM | POA: Diagnosis not present

## 2019-03-08 DIAGNOSIS — R2689 Other abnormalities of gait and mobility: Secondary | ICD-10-CM | POA: Diagnosis not present

## 2019-03-08 MED ORDER — ROSUVASTATIN CALCIUM 5 MG PO TABS: 5.0000 mg | ORAL_TABLET | Freq: Every day | ORAL | 1 refills | Status: DC

## 2019-03-09 DIAGNOSIS — I10 Essential (primary) hypertension: Secondary | ICD-10-CM | POA: Diagnosis not present

## 2019-03-09 DIAGNOSIS — R2689 Other abnormalities of gait and mobility: Secondary | ICD-10-CM | POA: Diagnosis not present

## 2019-03-09 DIAGNOSIS — F0390 Unspecified dementia without behavioral disturbance: Secondary | ICD-10-CM | POA: Diagnosis not present

## 2019-03-09 DIAGNOSIS — Z7982 Long term (current) use of aspirin: Secondary | ICD-10-CM | POA: Diagnosis not present

## 2019-03-14 ENCOUNTER — Encounter: Payer: Self-pay | Admitting: Internal Medicine

## 2019-03-14 ENCOUNTER — Other Ambulatory Visit: Payer: Self-pay

## 2019-03-14 DIAGNOSIS — R2689 Other abnormalities of gait and mobility: Secondary | ICD-10-CM | POA: Diagnosis not present

## 2019-03-14 DIAGNOSIS — I10 Essential (primary) hypertension: Secondary | ICD-10-CM | POA: Diagnosis not present

## 2019-03-14 DIAGNOSIS — F0390 Unspecified dementia without behavioral disturbance: Secondary | ICD-10-CM | POA: Diagnosis not present

## 2019-03-14 DIAGNOSIS — Z7982 Long term (current) use of aspirin: Secondary | ICD-10-CM | POA: Diagnosis not present

## 2019-03-20 DIAGNOSIS — R2689 Other abnormalities of gait and mobility: Secondary | ICD-10-CM | POA: Diagnosis not present

## 2019-03-20 DIAGNOSIS — I10 Essential (primary) hypertension: Secondary | ICD-10-CM | POA: Diagnosis not present

## 2019-03-20 DIAGNOSIS — F0390 Unspecified dementia without behavioral disturbance: Secondary | ICD-10-CM | POA: Diagnosis not present

## 2019-03-20 DIAGNOSIS — Z7982 Long term (current) use of aspirin: Secondary | ICD-10-CM | POA: Diagnosis not present

## 2019-03-21 ENCOUNTER — Other Ambulatory Visit: Payer: Self-pay | Admitting: Physician Assistant

## 2019-03-21 ENCOUNTER — Telehealth: Payer: Self-pay

## 2019-03-21 DIAGNOSIS — R413 Other amnesia: Secondary | ICD-10-CM

## 2019-03-21 DIAGNOSIS — E782 Mixed hyperlipidemia: Secondary | ICD-10-CM

## 2019-03-21 MED ORDER — ROSUVASTATIN CALCIUM 5 MG PO TABS: 5.0000 mg | ORAL_TABLET | Freq: Every day | ORAL | 1 refills | Status: DC

## 2019-03-21 NOTE — Telephone Encounter (Signed)
Aware of results. 03/21/19 at 4:41pm

## 2019-03-21 NOTE — Telephone Encounter (Signed)
-----   Message from Vicie Mutters, [place] sent at 03/20/2019 10:55 AM EDT ----- Regarding: Holter monitor results Still needs echocardiogram Holter monitor shows no arrhythmia   ----- Message ----- From: Jettie Booze, MD Sent: 03/20/2019   8:52 AM EDT To: Vicie Mutters, PA-C

## 2019-03-22 DIAGNOSIS — I10 Essential (primary) hypertension: Secondary | ICD-10-CM | POA: Diagnosis not present

## 2019-03-22 DIAGNOSIS — Z7982 Long term (current) use of aspirin: Secondary | ICD-10-CM | POA: Diagnosis not present

## 2019-03-22 DIAGNOSIS — F0390 Unspecified dementia without behavioral disturbance: Secondary | ICD-10-CM | POA: Diagnosis not present

## 2019-03-22 DIAGNOSIS — R2689 Other abnormalities of gait and mobility: Secondary | ICD-10-CM | POA: Diagnosis not present

## 2019-03-23 NOTE — Progress Notes (Signed)
FOLLOW UP  Assessment and Plan:   Essential hypertension - continue medications, DASH diet, exercise and monitor at home. Call if greater than 130/80.   Imbalance -patient states he is benefiting from it.   Mixed hyperlipidemia -     rosuvastatin (CRESTOR) 5 MG tablet; Take 1 tablet (5 mg total) by mouth at bedtime. Check labs  Memory changes -     rosuvastatin (CRESTOR) 5 MG tablet; Take 1 tablet (5 mg total) by mouth at bedtime. Continue to monitor Follow up neurology  Newly recognized heart murmur New systolic murmur best heard at apex-? MR No radiation to carotids  Continue diet and meds as discussed. Further disposition pending results of labs. Discussed med's effects and SE's.   Over 30 minutes of exam, counseling, chart review, and critical decision making was performed.   Future Appointments  Date Time Provider [place]  04/10/2019  4:00 PM [facility] ECHO 1 [id number]  07/24/2019 10:00 AM Unk Pinto, MD [facility] None  10/24/2019 11:15 AM Vicie Mutters, PA-C [facility] None    ----------------------------------------------------------------------------------------------------------------------  HPI 73 y.o. male  presents for 3 month follow up on hypertension, cholesterol, glucose managment, weight and vitamin D deficiency.  He also complains of worsening leg weakness with walking, 1 fall.He has a history of unsteady gait, walks with a cane. PT is coming out to the house once a week and he is working on exercises and states that he feels he is doing better with this.   He has dementia, followed with Dr. Tomi Likens. Wife provides a lot of answers, very difficult for the patient to provide answers.  Patient had possible syncopal episode last visit. Had normal holter monitor, pending echo and had new murmur last visit which patient is ruminating on and is very anxious over. Echo is schedule for 1 week.  Had negative sed rate, negative CRP, normal   Areas on his feet are cleared.  Him and his wife started walking, states that he has no SOB, no CP, no symptoms.   Brain MRI 01/2018 showed mild chronic changes.   BMI is Body mass index is 23.48 kg/m., he has been working on diet and exercise. Wt Readings from Last 3 Encounters:  03/26/19 178 lb (80.7 kg)  03/08/19 182 lb 9.6 oz (82.8 kg)  02/28/19 185 lb 8 oz (84.1 kg)    He is currently taking telmisartan 20 mg daily at night.Today his BP is BP: 132/80  He does workout. He denies chest pain, shortness of breath, dizziness.   He is on cholesterol medication WAS SWITCHED FROM PRAVASTATIN TO CRESTOR LAST VISIT and denies myalgias. His cholesterol is at goal. The cholesterol last visit was:  Lab Results  Component Value Date   CHOL 156 10/20/2018   HDL 57 10/20/2018   LDLCALC 84 10/20/2018   TRIG 69 10/20/2018   CHOLHDL 2.7 10/20/2018    He has been working on diet and exercise for glucose management (hx of prediabetes), and denies foot ulcerations, increased appetite, nausea, paresthesia of the feet, polydipsia, polyuria, visual disturbances, vomiting and weight loss. Last A1C in the office was:  Lab Results  Component Value Date   HGBA1C 5.4 06/26/2018   Patient is on Vitamin D supplement and at goal at recent check:    Lab Results  Component Value Date   VD25OH 67 06/26/2018      Current Medications:  Current Outpatient Medications on File Prior to Visit  Medication Sig  . ACCU-CHEK SOFTCLIX LANCETS lancets Check  blood sugar 1 time a day.DX-R73.03  . Alcohol Swabs (B-D SINGLE USE SWABS REGULAR) PADS Check blood sugar 1 time daily-DX-R73.03  . Blood Glucose Monitoring Suppl (ACCU-CHEK AVIVA PLUS) w/Device KIT Check blood sugar 1 time a day-DX-R73.03  . Cholecalciferol (VITAMIN D3 PO) Take 2,500 Units by mouth daily. Take 1/2 tablet daily  . glucose blood (ACCU-CHEK AVIVA PLUS) test strip Check blood sugar 1 time a day-DX-R73.03  . Magnesium 250 MG TABS Take 250 mg by  mouth daily.  . mirabegron ER (MYRBETRIQ) 50 MG TB24 tablet Take 1 tablet (50 mg total) by mouth daily.  Marland Kitchen omeprazole (PRILOSEC) 20 MG capsule Take 1 capsule 2 x/day for acid reflux & indigestion  . PAZEO 0.7 % SOLN   . promethazine (PHENERGAN) 25 MG tablet Takes 1 tablet every 4 hours as needed for nausea or vomititng.  . promethazine-dextromethorphan (PROMETHAZINE-DM) 6.25-15 MG/5ML syrup Take 5 mLs by mouth 4 (four) times daily as needed for cough.  . rosuvastatin (CRESTOR) 5 MG tablet Take 1 tablet (5 mg total) by mouth at bedtime.  . senna-docusate (SENOKOT-S) 8.6-50 MG tablet Take 1 tablet by mouth daily.  Marland Kitchen triamcinolone cream (KENALOG) 0.1 % Apply sparingly 2 to 3 x day (do not use on face)  . vitamin B-12 (CYANOCOBALAMIN) 500 MCG tablet Dissolve 1 tablet under tongue daily   No current facility-administered medications on file prior to visit.      Allergies:  Allergies  Allergen Reactions  . Hydrocodone-Acetaminophen Nausea Only  . Sudafed [Pseudoephedrine Hcl]     Urinary retention  . Tetanus Toxoids     Itching, burning feeling, ran a fever     Medical History:  Past Medical History:  Diagnosis Date  . Constipation   . Hyperlipidemia   . Hypertension   . Prediabetes    no meds   . Vitamin D deficiency    Family history- Reviewed and unchanged Social history- Reviewed and unchanged   Review of Systems:  Review of Systems  Constitutional: Positive for malaise/fatigue. Negative for weight loss.  HENT: Negative for hearing loss and tinnitus.   Eyes: Negative for blurred vision and double vision.  Respiratory: Negative for cough, shortness of breath and wheezing.   Cardiovascular: Negative for chest pain, palpitations, orthopnea, claudication, leg swelling and PND.  Gastrointestinal: Negative for abdominal pain, blood in stool, constipation, diarrhea, heartburn, melena, nausea and vomiting.  Genitourinary: Negative.   Musculoskeletal: Negative for falls, joint  pain and myalgias.  Skin: Negative for rash.  Neurological: Positive for weakness (bilateral legs). Negative for dizziness, tingling, sensory change, seizures, loss of consciousness and headaches.  Endo/Heme/Allergies: Negative for polydipsia.  Psychiatric/Behavioral: Positive for memory loss. Negative for depression, hallucinations, substance abuse and suicidal ideas. The patient is nervous/anxious. The patient does not have insomnia.   All other systems reviewed and are negative.   Physical Exam: BP 132/80   Pulse 90   Temp (!) 97.3 F (36.3 C)   Wt 178 lb (80.7 kg)   SpO2 98%   BMI 23.48 kg/m  Wt Readings from Last 3 Encounters:  03/26/19 178 lb (80.7 kg)  03/08/19 182 lb 9.6 oz (82.8 kg)  02/28/19 185 lb 8 oz (84.1 kg)   General Appearance: Well nourished, in no apparent distress. Eyes: PERRLA, EOMs, conjunctiva no swelling or erythema Sinuses: No Frontal/maxillary tenderness ENT/Mouth: Ext aud canals clear, TMs without erythema, bulging. No erythema, swelling, or exudate on post pharynx.  Tonsils not swollen or erythematous. Hearing decreased Neck: Supple, thyroid normal.  Respiratory: Respiratory effort normal, BS equal bilaterally without rales, rhonchi, wheezing or stridor.  Cardio: RRR with new systolic holosystolic murmur heard best at apex, worse with standing. Brisk peripheral pulses without edema.  Abdomen: Soft, + BS.  Non tender, no guarding, rebound, hernias, masses. Lymphatics: Non tender without lymphadenopathy.  Musculoskeletal: Full ROM, symmetrical strength,  Gait slow with cane.  Skin: Warm, dry without rashes, lesions, ecchymosis.  Neuro: Cranial nerves intact. No cerebellar symptoms.  Psych: Awake and oriented X 3, normal affect, Insight and Judgment appropriate.    Vicie Mutters, PA-C 11:40 AM [facility]

## 2019-03-26 ENCOUNTER — Other Ambulatory Visit: Payer: Self-pay

## 2019-03-26 ENCOUNTER — Ambulatory Visit (INDEPENDENT_AMBULATORY_CARE_PROVIDER_SITE_OTHER): Payer: Medicare HMO | Admitting: Physician Assistant

## 2019-03-26 VITALS — BP 132/80 | HR 90 | Temp 97.3°F | Wt 178.0 lb

## 2019-03-26 DIAGNOSIS — E538 Deficiency of other specified B group vitamins: Secondary | ICD-10-CM

## 2019-03-26 DIAGNOSIS — E782 Mixed hyperlipidemia: Secondary | ICD-10-CM | POA: Diagnosis not present

## 2019-03-26 DIAGNOSIS — I10 Essential (primary) hypertension: Secondary | ICD-10-CM | POA: Diagnosis not present

## 2019-03-26 NOTE — Patient Instructions (Signed)
Near-Syncope Near-syncope is when you suddenly become weak or dizzy, or you feel like you might pass out (faint). During an episode of near-syncope, you may:  Feel dizzy or light-headed.  Feel nauseous.  See all white or all black in your field of vision.  Have cold, clammy skin. This condition is caused by a sudden decrease in blood flow to the brain. This decrease can result from various causes, but most of those causes are not dangerous. However, near-syncope can be a sign of a serious medical problem, so it is important to seek medical care. If you fainted, get medical help right away.Call your local emergency services (911 in the [place] Do not drive yourself to the hospital. Follow these instructions at home: Pay attention to any changes in your symptoms. Take these actions to help with your condition:  Have someone stay with you until you feel stable.  Do not drive, use machinery, or play sports until your health care provider says it is okay.  Keep all follow-up visits as told by your health care provider. This is important.  If you start to feel like you might faint, lie down right away and raise (elevate) your feet above the level of your heart. Breathe deeply and steadily. Wait until all of the symptoms have passed.  Drink enough fluid to keep your urine clear or pale yellow.  If you are taking blood pressure or heart medicine, get up slowly and take several minutes to sit and then stand. This can reduce dizziness.  Take over-the-counter and prescription medicines only as told by your health care provider. Get help right away if:  You have a severe headache.  You have unusual pain in your chest, abdomen, or back.  You are bleeding from your mouth or rectum, or you have black or tarry stool.  You have a very fast or irregular heartbeat (palpitations).  You faint once or repeatedly.  You have a seizure.  You are confused.  You have trouble walking.  You have  severe weakness.  You have vision problems. These symptoms may represent a serious problem that is an emergency. Do not wait to see if your symptoms will go away. Get medical help right away. Call your local emergency services (911 in the [place] Do not drive yourself to the hospital. This information is not intended to replace advice given to you by your health care provider. Make sure you discuss any questions you have with your health care provider. Document Released: 10/04/2005 Document Revised: 11/16/2016 Document Reviewed: 06/18/2015 Elsevier Interactive Patient Education  2019 [employer].

## 2019-03-27 DIAGNOSIS — F0390 Unspecified dementia without behavioral disturbance: Secondary | ICD-10-CM | POA: Diagnosis not present

## 2019-03-27 DIAGNOSIS — I10 Essential (primary) hypertension: Secondary | ICD-10-CM | POA: Diagnosis not present

## 2019-03-27 DIAGNOSIS — Z7982 Long term (current) use of aspirin: Secondary | ICD-10-CM | POA: Diagnosis not present

## 2019-03-27 DIAGNOSIS — R2689 Other abnormalities of gait and mobility: Secondary | ICD-10-CM | POA: Diagnosis not present

## 2019-03-27 LAB — CBC WITH DIFFERENTIAL/PLATELET
Absolute Monocytes: 608 cells/uL (ref 200–950)
Basophils Absolute: 57 cells/uL (ref 0–200)
Basophils Relative: 0.6 %
Eosinophils Absolute: 29 cells/uL (ref 15–500)
Eosinophils Relative: 0.3 %
HCT: 44.7 % (ref 38.5–50.0)
Hemoglobin: 14.7 g/dL (ref 13.2–17.1)
Lymphs Abs: 988 cells/uL (ref 850–3900)
MCH: 30.2 pg (ref 27.0–33.0)
MCHC: 32.9 g/dL (ref 32.0–36.0)
MCV: 91.8 fL (ref 80.0–100.0)
MPV: 10.6 fL (ref 7.5–12.5)
Monocytes Relative: 6.4 %
Neutro Abs: 7819 cells/uL — ABNORMAL HIGH (ref 1500–7800)
Neutrophils Relative %: 82.3 %
Platelets: 186 10*3/uL (ref 140–400)
RBC: 4.87 10*6/uL (ref 4.20–5.80)
RDW: 13.1 % (ref 11.0–15.0)
Total Lymphocyte: 10.4 %
WBC: 9.5 10*3/uL (ref 3.8–10.8)

## 2019-03-27 LAB — COMPLETE METABOLIC PANEL WITH GFR
AG Ratio: 2.1 (calc) (ref 1.0–2.5)
ALT: 16 U/L (ref 9–46)
AST: 15 U/L (ref 10–35)
Albumin: 4 g/dL (ref 3.6–5.1)
Alkaline phosphatase (APISO): 68 U/L (ref 35–144)
BUN/Creatinine Ratio: 15 (calc) (ref 6–22)
BUN: 18 mg/dL (ref 7–25)
CO2: 30 mmol/L (ref 20–32)
Calcium: 9.3 mg/dL (ref 8.6–10.3)
Chloride: 106 mmol/L (ref 98–110)
Creat: 1.21 mg/dL — ABNORMAL HIGH (ref 0.70–1.18)
GFR, Est African American: 67 mL/min/{1.73_m2} (ref >=60)
GFR, Est Non African American: 58 mL/min/{1.73_m2} — ABNORMAL LOW (ref >=60)
Globulin: 1.9 g/dL (calc) (ref 1.9–3.7)
Glucose, Bld: 97 mg/dL (ref 65–99)
Potassium: 4.2 mmol/L (ref 3.5–5.3)
Sodium: 142 mmol/L (ref 135–146)
Total Bilirubin: 0.6 mg/dL (ref 0.2–1.2)
Total Protein: 5.9 g/dL — ABNORMAL LOW (ref 6.1–8.1)

## 2019-03-27 LAB — LIPID PANEL
Cholesterol: 119 mg/dL (ref <200)
HDL: 55 mg/dL (ref >=40)
LDL Cholesterol (Calc): 50 mg/dL (calc)
Non-HDL Cholesterol (Calc): 64 mg/dL (calc) (ref <130)
Total CHOL/HDL Ratio: 2.2 (calc) (ref <5.0)
Triglycerides: 56 mg/dL (ref <150)

## 2019-03-27 LAB — VITAMIN B12: Vitamin B-12: 794 pg/mL (ref 200–1100)

## 2019-03-29 DIAGNOSIS — Z7982 Long term (current) use of aspirin: Secondary | ICD-10-CM | POA: Diagnosis not present

## 2019-03-29 DIAGNOSIS — I10 Essential (primary) hypertension: Secondary | ICD-10-CM | POA: Diagnosis not present

## 2019-03-29 DIAGNOSIS — R2689 Other abnormalities of gait and mobility: Secondary | ICD-10-CM | POA: Diagnosis not present

## 2019-03-29 DIAGNOSIS — F0390 Unspecified dementia without behavioral disturbance: Secondary | ICD-10-CM | POA: Diagnosis not present

## 2019-04-02 DIAGNOSIS — R2689 Other abnormalities of gait and mobility: Secondary | ICD-10-CM | POA: Diagnosis not present

## 2019-04-02 DIAGNOSIS — Z7982 Long term (current) use of aspirin: Secondary | ICD-10-CM | POA: Diagnosis not present

## 2019-04-02 DIAGNOSIS — F0390 Unspecified dementia without behavioral disturbance: Secondary | ICD-10-CM | POA: Diagnosis not present

## 2019-04-02 DIAGNOSIS — I10 Essential (primary) hypertension: Secondary | ICD-10-CM | POA: Diagnosis not present

## 2019-04-05 ENCOUNTER — Other Ambulatory Visit (HOSPITAL_COMMUNITY): Payer: Medicare HMO

## 2019-04-09 ENCOUNTER — Telehealth (HOSPITAL_COMMUNITY): Payer: Self-pay

## 2019-04-09 NOTE — Telephone Encounter (Signed)
COVID-19 Pre-Screening Questions:  . Do you currently have a fever? no (yes = cancel and refer to pcp for e-visit) . Have you recently travelled on a cruise, internationally, or to [place], [place], [place], [place], [place], or [place], [place] [employer]) ? no (yes = cancel, stay home, monitor symptoms, and contact pcp or initiate e-visit if symptoms develop) . Have you been in contact with someone that is currently pending confirmation of Covid19 testing or has been confirmed to have the [place] virus?  No (yes = cancel, stay home, away from tested individual, monitor symptoms, and contact pcp or initiate e-visit if symptoms develop) . Are you currently experiencing fatigue or cough? no (yes = pt should be prepared to have a mask placed at the time of their visit). .  . Reiterated no additional visitors. Eartha Inch no earlier than 15 minutes before appointment time. . Please bring own mask.  Spoke with wife per DPR.

## 2019-04-10 ENCOUNTER — Ambulatory Visit (HOSPITAL_COMMUNITY): Payer: Medicare HMO | Attending: Internal Medicine

## 2019-04-10 ENCOUNTER — Other Ambulatory Visit: Payer: Self-pay

## 2019-04-10 DIAGNOSIS — I358 Other nonrheumatic aortic valve disorders: Secondary | ICD-10-CM | POA: Diagnosis not present

## 2019-04-10 DIAGNOSIS — I743 Embolism and thrombosis of arteries of the lower extremities: Secondary | ICD-10-CM | POA: Insufficient documentation

## 2019-04-11 DIAGNOSIS — Z7982 Long term (current) use of aspirin: Secondary | ICD-10-CM | POA: Diagnosis not present

## 2019-04-11 DIAGNOSIS — R2689 Other abnormalities of gait and mobility: Secondary | ICD-10-CM | POA: Diagnosis not present

## 2019-04-11 DIAGNOSIS — F0390 Unspecified dementia without behavioral disturbance: Secondary | ICD-10-CM | POA: Diagnosis not present

## 2019-04-11 DIAGNOSIS — I10 Essential (primary) hypertension: Secondary | ICD-10-CM | POA: Diagnosis not present

## 2019-04-17 DIAGNOSIS — Z7982 Long term (current) use of aspirin: Secondary | ICD-10-CM | POA: Diagnosis not present

## 2019-04-17 DIAGNOSIS — F0390 Unspecified dementia without behavioral disturbance: Secondary | ICD-10-CM | POA: Diagnosis not present

## 2019-04-17 DIAGNOSIS — I10 Essential (primary) hypertension: Secondary | ICD-10-CM | POA: Diagnosis not present

## 2019-04-17 DIAGNOSIS — R2689 Other abnormalities of gait and mobility: Secondary | ICD-10-CM | POA: Diagnosis not present

## 2019-04-18 NOTE — Progress Notes (Signed)
THIS ENCOUNTER IS A VIRTUAL/TELEPHONE VISIT DUE TO COVID-19 - PATIENT WAS NOT SEEN IN THE OFFICE.  PATIENT HAS CONSENTED TO VIRTUAL VISIT / TELEMEDICINE VISIT  This provider placed a call to [name] using telephone, his appointment was changed to a virtual office visit to reduce the risk of exposure to the COVID-19 virus and to help [employer]. remain healthy and safe. The virtual visit will also provide continuity of care. He verbalizes understanding.   FOLLOW UP  Assessment and Plan:  Hoyle was seen today for acute visit.  Diagnoses and all orders for this visit:  OAB (overactive bladder) -     tamsulosin (FLOMAX) 0.4 MG CAPS capsule; Take 1 capsule (0.4 mg total) by mouth daily after supper.  Continue the myrbetriq Will try once a night on tamsulosin  If this does not help after 2 weeks message the office and we can either 1 1) take oxybutynin AS needed for travel or events, this can cause dry mouth or constipation 2) refer to a urologist ? Needs trial of amitriptyline low dose since primarily with travel/situlational  If any fever, chills, blood in urine, unable to urinate, severe AB pain or worsening symptoms, patient will go to the ER.    Continue diet and meds as discussed. Further disposition pending results of labs. Discussed med's effects and SE's.   Over 30 minutes of exam, counseling, chart review, and critical decision making was performed.   Future Appointments  Date Time Provider [place]  07/24/2019 10:00 AM Unk Pinto, MD [facility] None  10/24/2019 11:15 AM Vicie Mutters, PA-C [facility] None    ----------------------------------------------------------------------------------------------------------------------  HPI 73 y.o. male  presents for abnormal urine.   Nocturia 1-2 x night, if he takes a trip he will have to stop every 1-3 hours,  will have urgency without much urine, feels like the myrbetriq is no helping. Denies  weak stream, no dribbling. No fever, chills, dysuria.  His wife helps with the history.   BMI is Body mass index is 23.88 kg/m., he has been working on diet and exercise. Wt Readings from Last 3 Encounters:  04/19/19 181 lb (82.1 kg)  03/26/19 178 lb (80.7 kg)  03/08/19 182 lb 9.6 oz (82.8 kg)    Current Medications:  Current Outpatient Medications on File Prior to Visit  Medication Sig  . ACCU-CHEK SOFTCLIX LANCETS lancets Check blood sugar 1 time a day.DX-R73.03  . Alcohol Swabs (B-D SINGLE USE SWABS REGULAR) PADS Check blood sugar 1 time daily-DX-R73.03  . Blood Glucose Monitoring Suppl (ACCU-CHEK AVIVA PLUS) w/Device KIT Check blood sugar 1 time a day-DX-R73.03  . Cholecalciferol (VITAMIN D3 PO) Take 2,500 Units by mouth daily. Take 1/2 tablet daily  . glucose blood (ACCU-CHEK AVIVA PLUS) test strip Check blood sugar 1 time a day-DX-R73.03  . Magnesium 250 MG TABS Take 250 mg by mouth daily.  . mirabegron ER (MYRBETRIQ) 50 MG TB24 tablet Take 1 tablet (50 mg total) by mouth daily.  Marland Kitchen omeprazole (PRILOSEC) 20 MG capsule Take 1 capsule 2 x/day for acid reflux & indigestion  . PAZEO 0.7 % SOLN   . rosuvastatin (CRESTOR) 5 MG tablet Take 1 tablet (5 mg total) by mouth at bedtime.  . senna-docusate (SENOKOT-S) 8.6-50 MG tablet Take 1 tablet by mouth daily.  Marland Kitchen triamcinolone cream (KENALOG) 0.1 % Apply sparingly 2 to 3 x day (do not use on face)  . vitamin B-12 (CYANOCOBALAMIN) 500 MCG tablet Dissolve 1 tablet under tongue daily  .  promethazine (PHENERGAN) 25 MG tablet Takes 1 tablet every 4 hours as needed for nausea or vomititng. (Patient not taking: Reported on 04/19/2019)  . promethazine-dextromethorphan (PROMETHAZINE-DM) 6.25-15 MG/5ML syrup Take 5 mLs by mouth 4 (four) times daily as needed for cough. (Patient not taking: Reported on 04/19/2019)   No current facility-administered medications on file prior to visit.      Allergies:  Allergies  Allergen Reactions  .  Hydrocodone-Acetaminophen Nausea Only  . Sudafed [Pseudoephedrine Hcl]     Urinary retention  . Tetanus Toxoids     Itching, burning feeling, ran a fever     Medical History:  Past Medical History:  Diagnosis Date  . Constipation   . Hyperlipidemia   . Hypertension   . Prediabetes    no meds   . Vitamin D deficiency    Family history- Reviewed and unchanged Social history- Reviewed and unchanged   Review of Systems:  Review of Systems  Constitutional: Negative for malaise/fatigue and weight loss.  HENT: Negative for hearing loss and tinnitus.   Eyes: Negative for blurred vision and double vision.  Respiratory: Negative for cough, shortness of breath and wheezing.   Cardiovascular: Negative for chest pain, palpitations, orthopnea, claudication, leg swelling and PND.  Gastrointestinal: Negative for abdominal pain, blood in stool, constipation, diarrhea, heartburn, melena, nausea and vomiting.  Genitourinary: Positive for frequency and urgency. Negative for dysuria, flank pain and hematuria.  Musculoskeletal: Negative for back pain, falls, joint pain and myalgias.  Skin: Negative for rash.  Neurological: Negative for dizziness, tingling, sensory change, seizures, loss of consciousness, weakness and headaches.  Endo/Heme/Allergies: Negative for polydipsia.  Psychiatric/Behavioral: Negative for depression, hallucinations, memory loss, substance abuse and suicidal ideas. The patient is not nervous/anxious and does not have insomnia.   All other systems reviewed and are negative.   Physical Exam: BP 132/85   Temp 97.8 F (36.6 C)   Wt 181 lb (82.1 kg)   BMI 23.88 kg/m  Wt Readings from Last 3 Encounters:  04/19/19 181 lb (82.1 kg)  03/26/19 178 lb (80.7 kg)  03/08/19 182 lb 9.6 oz (82.8 kg)   General Appearance:Well sounding, in no apparent distress.  ENT/Mouth: No hoarseness, No cough for duration of visit.  Respiratory: completing full sentences without distress,  without audible wheeze Neuro: Awake and oriented X 3,  Psych:  Insight and Judgment appropriate.     Vicie Mutters, PA-C 2:11 PM [facility]

## 2019-04-19 ENCOUNTER — Other Ambulatory Visit: Payer: Self-pay

## 2019-04-19 ENCOUNTER — Ambulatory Visit: Payer: Medicare HMO | Admitting: Physician Assistant

## 2019-04-19 ENCOUNTER — Encounter: Payer: Self-pay | Admitting: Physician Assistant

## 2019-04-19 VITALS — BP 132/85 | Temp 97.8°F | Wt 181.0 lb

## 2019-04-19 DIAGNOSIS — N3281 Overactive bladder: Secondary | ICD-10-CM | POA: Diagnosis not present

## 2019-04-19 MED ORDER — TAMSULOSIN HCL 0.4 MG PO CAPS: 0.4000 mg | ORAL_CAPSULE | Freq: Every day | ORAL | 2 refills | Status: DC

## 2019-04-19 NOTE — Patient Instructions (Signed)
Continue the myrbetriq Will try once a night on tamsulosin  If this does not help after 2 weeks message the office and we can either 1 1) take oxybutynin AS needed for travel or events, this can cause dry mouth or constipation 2) refer to a urologist   Overactive Bladder, Adult  Overactive bladder refers to a condition in which a person has a sudden need to pass urine. The person may leak urine if he or she cannot get to the bathroom fast enough (urinary incontinence). A person with this condition may also wake up several times in the night to go to the bathroom. Overactive bladder is associated with poor nerve signals between your bladder and your brain. Your bladder may get the signal to empty before it is full. You may also have very sensitive muscles that make your bladder squeeze too soon. These symptoms might interfere with daily work or social activities. What are the causes? This condition may be associated with or caused by:  Urinary tract infection.  Infection of nearby tissues, such as the prostate.  Prostate enlargement.  Surgery on the uterus or urethra.  Bladder stones, inflammation, or tumors.  Drinking too much caffeine or alcohol.  Certain medicines, especially medicines that get rid of extra fluid in the body (diuretics).  Muscle or nerve weakness, especially from: ? A spinal cord injury. ? Stroke. ? Multiple sclerosis. ? Parkinson's disease.  Diabetes.  Constipation. What increases the risk? You may be at greater risk for overactive bladder if you:  Are an older adult.  Smoke.  Are going through menopause.  Have prostate problems.  Have a neurological disease, such as stroke, dementia, Parkinson's disease, or multiple sclerosis (MS).  Eat or drink things that irritate the bladder. These include alcohol, spicy food, and caffeine.  Are overweight or obese. What are the signs or symptoms? Symptoms of this condition include:  Sudden, strong urge  to urinate.  Leaking urine.  Urinating 8 or more times a day.  Waking up to urinate 2 or more times a night. How is this diagnosed? Your health care provider may suspect overactive bladder based on your symptoms. He or she will diagnose this condition by:  A physical exam and medical history.  Blood or urine tests. You might need bladder or urine tests to help determine what is causing your overactive bladder. You might also need to see a health care provider who specializes in urinary tract problems (urologist). How is this treated? Treatment for overactive bladder depends on the cause of your condition and whether it is mild or severe. You can also make lifestyle changes at home. Options include:  Bladder training. This may include: ? Learning to control the urge to urinate by following a schedule that directs you to urinate at regular intervals (timed voiding). ? Doing Kegel exercises to strengthen your pelvic floor muscles, which support your bladder. Toning these muscles can help you control urination, even if your bladder muscles are overactive.  Special devices. This may include: ? Biofeedback, which uses sensors to help you become aware of your body's signals. ? Electrical stimulation, which uses electrodes placed inside the body (implanted) or outside the body. These electrodes send gentle pulses of electricity to strengthen the nerves or muscles that control the bladder. ? Women may use a plastic device that fits into the vagina and supports the bladder (pessary).  Medicines. ? Antibiotics to treat bladder infection. ? Antispasmodics to stop the bladder from releasing urine at the wrong  time. ? Tricyclic antidepressants to relax bladder muscles. ? Injections of botulinum toxin type A directly into the bladder tissue to relax bladder muscles.  Lifestyle changes. This may include: ? Weight loss. Talk to your health care provider about weight loss methods that would work best  for you. ? Diet changes. This may include reducing how much alcohol and caffeine you consume, or drinking fluids at different times of the day. ? Not smoking. Do not use any products that contain nicotine or tobacco, such as cigarettes and e-cigarettes. If you need help quitting, ask your health care provider.  Surgery. ? A device may be implanted to help manage the nerve signals that control urination. ? An electrode may be implanted to stimulate electrical signals in the bladder. ? A procedure may be done to change the shape of the bladder. This is done only in very severe cases. Follow these instructions at home: Lifestyle  Make any diet or lifestyle changes that are recommended by your health care provider. These may include: ? Drinking less fluid or drinking fluids at different times of the day. ? Cutting down on caffeine or alcohol. ? Doing Kegel exercises. ? Losing weight if needed. ? Eating a healthy and balanced diet to prevent constipation. This may include:  Eating foods that are high in fiber, such as fresh fruits and vegetables, whole grains, and beans.  Limiting foods that are high in fat and processed sugars, such as fried and sweet foods. General instructions  Take over-the-counter and prescription medicines only as told by your health care provider.  If you were prescribed an antibiotic medicine, take it as told by your health care provider. Do not stop taking the antibiotic even if you start to feel better.  Use any implants or pessary as told by your health care provider.  If needed, wear pads to absorb urine leakage.  Keep a journal or log to track how much and when you drink and when you feel the need to urinate. This will help your health care provider monitor your condition.  Keep all follow-up visits as told by your health care provider. This is important. Contact a health care provider if:  You have a fever.  Your symptoms do not get better with  treatment.  Your pain and discomfort get worse.  You have more frequent urges to urinate. Get help right away if:  You are not able to control your bladder. Summary  Overactive bladder refers to a condition in which a person has a sudden need to pass urine.  Several conditions may lead to an overactive bladder.  Treatment for overactive bladder depends on the cause and severity of your condition.  Follow your health care provider's instructions about lifestyle changes, doing Kegel exercises, keeping a journal, and taking medicines. This information is not intended to replace advice given to you by your health care provider. Make sure you discuss any questions you have with your health care provider. Document Released: 07/31/2009 Document Revised: 01/25/2019 Document Reviewed: 10/20/2017 Elsevier Patient Education  2020 [employer].

## 2019-04-23 ENCOUNTER — Other Ambulatory Visit: Payer: Self-pay | Admitting: Physician Assistant

## 2019-04-24 DIAGNOSIS — R2689 Other abnormalities of gait and mobility: Secondary | ICD-10-CM | POA: Diagnosis not present

## 2019-04-24 DIAGNOSIS — F0390 Unspecified dementia without behavioral disturbance: Secondary | ICD-10-CM | POA: Diagnosis not present

## 2019-04-24 DIAGNOSIS — I10 Essential (primary) hypertension: Secondary | ICD-10-CM | POA: Diagnosis not present

## 2019-04-24 DIAGNOSIS — Z7982 Long term (current) use of aspirin: Secondary | ICD-10-CM | POA: Diagnosis not present

## 2019-05-02 DIAGNOSIS — Z7982 Long term (current) use of aspirin: Secondary | ICD-10-CM | POA: Diagnosis not present

## 2019-05-02 DIAGNOSIS — F0390 Unspecified dementia without behavioral disturbance: Secondary | ICD-10-CM | POA: Diagnosis not present

## 2019-05-02 DIAGNOSIS — R2689 Other abnormalities of gait and mobility: Secondary | ICD-10-CM | POA: Diagnosis not present

## 2019-05-02 DIAGNOSIS — I10 Essential (primary) hypertension: Secondary | ICD-10-CM | POA: Diagnosis not present

## 2019-05-27 ENCOUNTER — Other Ambulatory Visit: Payer: Self-pay | Admitting: Internal Medicine

## 2019-05-27 DIAGNOSIS — K219 Gastro-esophageal reflux disease without esophagitis: Secondary | ICD-10-CM

## 2019-05-27 DIAGNOSIS — N3281 Overactive bladder: Secondary | ICD-10-CM

## 2019-05-28 MED ORDER — TAMSULOSIN HCL 0.4 MG PO CAPS: 0.4000 mg | ORAL_CAPSULE | Freq: Every day | ORAL | 2 refills | Status: DC

## 2019-06-06 DIAGNOSIS — R55 Syncope and collapse: Secondary | ICD-10-CM | POA: Diagnosis not present

## 2019-06-06 DIAGNOSIS — W19XXXA Unspecified fall, initial encounter: Secondary | ICD-10-CM | POA: Diagnosis not present

## 2019-06-06 DIAGNOSIS — I10 Essential (primary) hypertension: Secondary | ICD-10-CM | POA: Diagnosis not present

## 2019-06-26 DIAGNOSIS — L259 Unspecified contact dermatitis, unspecified cause: Secondary | ICD-10-CM

## 2019-06-27 MED ORDER — TRIAMCINOLONE ACETONIDE 0.1 % EX CREA: TOPICAL_CREAM | CUTANEOUS | 3 refills | Status: DC

## 2019-07-23 ENCOUNTER — Encounter: Payer: Self-pay | Admitting: Internal Medicine

## 2019-07-23 NOTE — Patient Instructions (Signed)

## 2019-07-23 NOTE — Progress Notes (Signed)
Annual  Screening/Preventative Visit  & Comprehensive Evaluation & Examination     This very nice 73 y.o. [name] presents for a Screening /Preventative Visit & comprehensive evaluation and management of multiple medical co-morbidities.  Patient has been followed for HTN, HLD, Prediabetes and Vitamin D Deficiency.  Patient has been evaluated  by Dr Tomi Likens for mild Dementia with Brain MRI  (12/2017) demonstrating  expected Atrophy and moderate chronic microvascular ischemic changes including old strokes in the white matter & pons. He also had a low Vit B12 level ("235") in Mar 2019 & was advised SL replacement.  Patient has GERD controlled on his meds.     Patient is very symptomatic with LUTS as frequency x 10+ /day, urgency, nocturia x 5-8 & occasional hesitancy.      HTN predates since 2000 and has been followed expectantly. Patient's BP has been controlled at home.  Today's BP is borderline today -  128/88. Patient denies any cardiac symptoms as chest pain, palpitations, shortness of breath, dizziness or ankle swelling.     Patient's hyperlipidemia is controlled with diet and Rosuvastatin. Patient denies myalgias or other medication SE's. Last lipids were at goal:  Lab Results  Component Value Date   CHOL 119 03/26/2019   HDL 55 03/26/2019   LDLCALC 50 03/26/2019   TRIG 56 03/26/2019   CHOLHDL 2.2 03/26/2019      Patient has hx/o prediabetes (A1c 6.1% / 2012) and patient denies reactive hypoglycemic symptoms, visual blurring, diabetic polys or paresthesias. Last A1c was Normal & at goal:  Lab Results  Component Value Date   HGBA1C 5.4 06/26/2018       Finally, patient has history of Vitamin D Deficiency ("33" / 2008)  and last vitamin D was at goal:  Lab Results  Component Value Date   VD25OH 67 06/26/2018   Current Outpatient Medications on File Prior to Visit  Medication Sig  . ACCU-CHEK SOFTCLIX LANCETS lancets Check blood sugar 1 time a day.DX-R73.03  . Alcohol Swabs (B-D  SINGLE USE SWABS REGULAR) PADS Check blood sugar 1 time daily-DX-R73.03  . Blood Glucose Monitoring Suppl (ACCU-CHEK AVIVA PLUS) w/Device KIT Check blood sugar 1 time a day-DX-R73.03  . Cholecalciferol (VITAMIN D3 PO) Take 2,500 Units by mouth daily. Take 1/2 tablet daily  . glucose blood (ACCU-CHEK AVIVA PLUS) test strip Check blood sugar 1 time a day-DX-R73.03  . GNP STOOL SOFTENER/LAXATIVE 8.6-50 MG tablet TAKE 1 TABLET EVERY DAY  . Magnesium 250 MG TABS Take 250 mg by mouth daily.  . mirabegron ER (MYRBETRIQ) 50 MG TB24 tablet Take 1 tablet (50 mg total) by mouth daily.  Marland Kitchen omeprazole (PRILOSEC) 20 MG capsule Take 1 capsule 2 x /day for Acid Indigestion & Heart burn  . PAZEO 0.7 % SOLN   . rosuvastatin (CRESTOR) 5 MG tablet Take 1 tablet (5 mg total) by mouth at bedtime.  . tamsulosin (FLOMAX) 0.4 MG CAPS capsule Take 1 capsule (0.4 mg total) by mouth daily after supper.  . triamcinolone cream (KENALOG) 0.1 % Apply sparingly 2 to 3 x day (do not use on face)  . vitamin B-12 (CYANOCOBALAMIN) 500 MCG tablet Dissolve 1 tablet under tongue daily   No current facility-administered medications on file prior to visit.    Allergies  Allergen Reactions  . Hydrocodone-Acetaminophen Nausea Only  . Sudafed [Pseudoephedrine Hcl]     Urinary retention  . Tetanus Toxoids     Itching, burning feeling, ran a fever   Past Medical History:  Diagnosis Date  . Constipation   . Hyperlipidemia   . Hypertension   . Prediabetes    no meds   . Vitamin D deficiency    Health Maintenance  Topic Date Due  . Hepatitis C Screening  08/31/2023 (Originally Nov 08, 1945)  . TETANUS/TDAP  08/26/2027 (Originally 10/19/1977)  . COLONOSCOPY  09/05/2019  . INFLUENZA VACCINE  Completed  . PNA vac Low Risk Adult  Completed   Immunization History  Administered Date(s) Administered  . Influenza Split 08/06/2013  . Influenza, High Dose Seasonal PF 07/30/2014, 07/29/2015, 08/11/2016, 07/20/2017, 07/18/2018  .  Influenza-Unspecified 07/18/2018, 06/24/2019  . Pneumococcal Conjugate-13 04/23/2016  . Pneumococcal Polysaccharide-23 10/21/2009  . Td 10/18/1965  . Zoster 10/19/2011  . Zoster Recombinat (Shingrix) 03/17/2018, 06/08/2018   Last Colon - 09/04/2014 - Dr Deatra Ina  (+) Adenoma - recc f/u 5 years due  Nov 2020   Past Surgical History:  Procedure Laterality Date  . CATARACT EXTRACTION, BILATERAL  2017  . COLONOSCOPY    . NO PAST SURGERIES     Family History  Problem Relation Age of Onset  . Hyperlipidemia Mother   . Glaucoma Mother   . Heart disease Father   . Hypertension Father   . Colon cancer Neg Hx   . Esophageal cancer Neg Hx   . Pancreatic cancer Neg Hx   . Prostate cancer Neg Hx   . Rectal cancer Neg Hx   . Stomach cancer Neg Hx    Social History   Socioeconomic History  . Marital status: Unknown    Spouse name: Bethena Roys  . Number of children: 2 daughters & 2 GC  Occupational History  . Occupation: retired Environmental education officer  Tobacco Use  . Smoking status: Never Smoker  . Smokeless tobacco: Never Used  Substance and Sexual Activity  . Alcohol use: Yes    Alcohol/week: 0.0 standard drinks    Comment: very Rare /Beer.wine.  . Drug use: No  . Sexual activity: Not on file    ROS Constitutional: Denies fever, chills, weight loss/gain, headaches, insomnia,  night sweats or change in appetite. Does c/o fatigue. Eyes: Denies redness, blurred vision, diplopia, discharge, itchy or watery eyes.  ENT: Denies discharge, congestion, post nasal drip, epistaxis, sore throat, earache, hearing loss, dental pain, Tinnitus, Vertigo, Sinus pain or snoring.  Cardio: Denies chest pain, palpitations, irregular heartbeat, syncope, dyspnea, diaphoresis, orthopnea, PND, claudication or edema Respiratory: denies cough, dyspnea, DOE, pleurisy, hoarseness, laryngitis or wheezing.  Gastrointestinal: Denies dysphagia, heartburn, reflux, water brash, pain, cramps, nausea,  vomiting, bloating, diarrhea, constipation, hematemesis, melena, hematochezia, jaundice or hemorrhoids Genitourinary: Denies dysuria,  discharge, hematuria or flank pain. Has frequency x 10+ /day, urgency, nocturia x 5-8 & occasional hesitancy. Musculoskeletal: Denies arthralgia, myalgia, stiffness, Jt. Swelling, pain, limp or strain/sprain. Denies Falls. Skin: Denies puritis, rash, hives, warts, acne, eczema or change in skin lesion Neuro: No weakness, tremor, incoordination, spasms, paresthesia or pain Psychiatric: Denies confusion, memory loss or sensory loss. Denies Depression. Endocrine: Denies change in weight, skin, hair change, nocturia, and paresthesia, diabetic polys, visual blurring or hyper / hypo glycemic episodes.  Heme/Lymph: No excessive bleeding, bruising or enlarged lymph nodes.  Physical Exam  BP 128/88   Pulse 76   Temp (!) 97.2 F (36.2 C)   Resp 16   Ht 6' (1.829 m)   Wt 179 lb 3.2 oz (81.3 kg)   BMI 24.30 kg/m   General Appearance: Well nourished and well groomed and in no apparent distress.  Eyes:  PERRLA, EOMs, conjunctiva no swelling or erythema, normal fundi and vessels. Sinuses: No frontal/maxillary tenderness ENT/Mouth: EACs patent / TMs  nl. Nares clear without erythema, swelling, mucoid exudates. Oral hygiene is good. No erythema, swelling, or exudate. Tongue normal, non-obstructing. Tonsils not swollen or erythematous. Hearing normal.  Neck: Supple, thyroid not palpable. No bruits, nodes or JVD. Respiratory: Respiratory effort normal.  BS equal and clear bilateral without rales, rhonci, wheezing or stridor. Cardio: Heart sounds are normal with regular rate and rhythm and no murmurs, rubs or gallops. Peripheral pulses are normal and equal bilaterally without edema. No aortic or femoral bruits. Chest: symmetric with normal excursions and percussion.  Abdomen: Soft, with Nl bowel sounds. Nontender, no guarding, rebound, hernias, masses, or organomegaly.   Lymphatics: Non tender without lymphadenopathy.  Musculoskeletal: Full ROM all peripheral extremities, joint stability, 5/5 strength, and normal gait. Skin: Warm and dry without rashes, lesions, cyanosis, clubbing or  ecchymosis.  Neuro: Cranial nerves intact, reflexes equal bilaterally. Normal muscle tone, no cerebellar symptoms. Sensation intact.  Pysch: Alert and oriented X 3 with normal affect, insight and judgment appropriate.   Assessment and Plan  1. Annual Preventative/Screening Exam   2. Labile hypertension  - EKG 12-Lead - [place], RETROPERITNL ABD,  LTD - Urinalysis, Routine w reflex microscopic - Microalbumin / creatinine urine ratio - CBC with Differential/Platelet - COMPLETE METABOLIC PANEL WITH GFR - Magnesium - TSH  3. Hyperlipidemia, mixed  - EKG 12-Lead - [place], RETROPERITNL ABD,  LTD - Lipid panel - TSH  4. Abnormal glucose  - EKG 12-Lead - [place], RETROPERITNL ABD,  LTD - Insulin, random - Hemoglobin A1c  5. Vitamin D deficiency  - VITAMIN D 25 Hydroxyl  6. Prediabetes  - EKG 12-Lead - [place], RETROPERITNL ABD,  LTD - Insulin, random - Hemoglobin A1c  7. B12 deficiency  - Vitamin B12  8. BPH with obstruction/lower urinary tract symptoms  - Urology Referral   - PSA  9. Dementia without behavioral disturbance ([place])  10. Prostate cancer screening  - PSA  11. Elevated PSA  - PSA  12. Gastroesophageal reflux disease without esophagitis  - CBC with Differential/Platelet  13. Screening for ischemic heart disease  - EKG 12-Lead  14. FHx: heart disease  - EKG 12-Lead - [place], RETROPERITNL ABD,  LTD  15. Screening for AAA (aortic abdominal aneurysm)  - [place], RETROPERITNL ABD,  LTD  16. Medication management  - Urinalysis, Routine w reflex microscopic - Microalbumin / creatinine urine ratio - Vitamin B12 - CBC with Differential/Platelet - COMPLETE METABOLIC PANEL WITH GFR - Magnesium - Lipid panel - TSH - Insulin, random - VITAMIN D  25 Hydroxyl - Hemoglobin A1c        Patient was counseled in prudent diet, weight control to achieve/maintain BMI less than 25, BP monitoring, regular exercise and medications as discussed.  Discussed med effects and SE's. Routine screening labs and tests as requested with regular follow-up as recommended. Over 40 minutes of exam, counseling, chart review and high complex critical decision making was performed   Kirtland Bouchard, MD

## 2019-07-24 ENCOUNTER — Ambulatory Visit (INDEPENDENT_AMBULATORY_CARE_PROVIDER_SITE_OTHER): Payer: Medicare HMO | Admitting: Internal Medicine

## 2019-07-24 ENCOUNTER — Other Ambulatory Visit: Payer: Self-pay

## 2019-07-24 VITALS — BP 128/88 | HR 76 | Temp 97.2°F | Resp 16 | Ht 72.0 in | Wt 179.2 lb

## 2019-07-24 DIAGNOSIS — E782 Mixed hyperlipidemia: Secondary | ICD-10-CM | POA: Diagnosis not present

## 2019-07-24 DIAGNOSIS — Z79899 Other long term (current) drug therapy: Secondary | ICD-10-CM

## 2019-07-24 DIAGNOSIS — R7309 Other abnormal glucose: Secondary | ICD-10-CM

## 2019-07-24 DIAGNOSIS — F0390 Unspecified dementia without behavioral disturbance: Secondary | ICD-10-CM

## 2019-07-24 DIAGNOSIS — R7303 Prediabetes: Secondary | ICD-10-CM

## 2019-07-24 DIAGNOSIS — N401 Enlarged prostate with lower urinary tract symptoms: Secondary | ICD-10-CM | POA: Diagnosis not present

## 2019-07-24 DIAGNOSIS — K219 Gastro-esophageal reflux disease without esophagitis: Secondary | ICD-10-CM | POA: Diagnosis not present

## 2019-07-24 DIAGNOSIS — N138 Other obstructive and reflux uropathy: Secondary | ICD-10-CM

## 2019-07-24 DIAGNOSIS — Z8249 Family history of ischemic heart disease and other diseases of the circulatory system: Secondary | ICD-10-CM

## 2019-07-24 DIAGNOSIS — R972 Elevated prostate specific antigen [PSA]: Secondary | ICD-10-CM | POA: Diagnosis not present

## 2019-07-24 DIAGNOSIS — E538 Deficiency of other specified B group vitamins: Secondary | ICD-10-CM | POA: Diagnosis not present

## 2019-07-24 DIAGNOSIS — Z125 Encounter for screening for malignant neoplasm of prostate: Secondary | ICD-10-CM | POA: Diagnosis not present

## 2019-07-24 DIAGNOSIS — Z0001 Encounter for general adult medical examination with abnormal findings: Secondary | ICD-10-CM

## 2019-07-24 DIAGNOSIS — R0989 Other specified symptoms and signs involving the circulatory and respiratory systems: Secondary | ICD-10-CM

## 2019-07-24 DIAGNOSIS — Z136 Encounter for screening for cardiovascular disorders: Secondary | ICD-10-CM | POA: Diagnosis not present

## 2019-07-24 DIAGNOSIS — E559 Vitamin D deficiency, unspecified: Secondary | ICD-10-CM

## 2019-07-24 DIAGNOSIS — I951 Orthostatic hypotension: Secondary | ICD-10-CM

## 2019-07-24 DIAGNOSIS — Z0000 Encounter for general adult medical examination without abnormal findings: Secondary | ICD-10-CM | POA: Diagnosis not present

## 2019-07-24 MED ORDER — FLUDROCORTISONE ACETATE 0.1 MG PO TABS: ORAL_TABLET | ORAL | 1 refills | Status: DC

## 2019-07-25 LAB — LIPID PANEL
Cholesterol: 123 mg/dL (ref <200)
HDL: 56 mg/dL (ref >=40)
LDL Cholesterol (Calc): 55 mg/dL (calc)
Non-HDL Cholesterol (Calc): 67 mg/dL (calc) (ref <130)
Total CHOL/HDL Ratio: 2.2 (calc) (ref <5.0)
Triglycerides: 50 mg/dL (ref <150)

## 2019-07-25 LAB — URINALYSIS, ROUTINE W REFLEX MICROSCOPIC
Bilirubin Urine: NEGATIVE
Glucose, UA: NEGATIVE
Hgb urine dipstick: NEGATIVE
Ketones, ur: NEGATIVE
Leukocytes,Ua: NEGATIVE
Nitrite: NEGATIVE
Protein, ur: NEGATIVE
Specific Gravity, Urine: 1.017 (ref 1.001–1.03)
pH: 6.5 (ref 5.0–8.0)

## 2019-07-25 LAB — MICROALBUMIN / CREATININE URINE RATIO
Creatinine, Urine: 188 mg/dL (ref 20–320)
Microalb Creat Ratio: 13 mcg/mg creat (ref <30)
Microalb, Ur: 2.5 mg/dL

## 2019-07-25 LAB — INSULIN, RANDOM: Insulin: 15.7 u[IU]/mL

## 2019-07-25 LAB — CBC WITH DIFFERENTIAL/PLATELET
Absolute Monocytes: 679 cells/uL (ref 200–950)
Basophils Absolute: 47 cells/uL (ref 0–200)
Basophils Relative: 0.5 %
Eosinophils Absolute: 28 cells/uL (ref 15–500)
Eosinophils Relative: 0.3 %
HCT: 44.2 % (ref 38.5–50.0)
Hemoglobin: 14.5 g/dL (ref 13.2–17.1)
Lymphs Abs: 1274 cells/uL (ref 850–3900)
MCH: 29.8 pg (ref 27.0–33.0)
MCHC: 32.8 g/dL (ref 32.0–36.0)
MCV: 90.9 fL (ref 80.0–100.0)
MPV: 10.7 fL (ref 7.5–12.5)
Monocytes Relative: 7.3 %
Neutro Abs: 7273 cells/uL (ref 1500–7800)
Neutrophils Relative %: 78.2 %
Platelets: 175 10*3/uL (ref 140–400)
RBC: 4.86 10*6/uL (ref 4.20–5.80)
RDW: 13.1 % (ref 11.0–15.0)
Total Lymphocyte: 13.7 %
WBC: 9.3 10*3/uL (ref 3.8–10.8)

## 2019-07-25 LAB — PSA: PSA: 5.6 ng/mL — ABNORMAL HIGH (ref <=4.0)

## 2019-07-25 LAB — COMPLETE METABOLIC PANEL WITH GFR
AG Ratio: 1.9 (calc) (ref 1.0–2.5)
ALT: 14 U/L (ref 9–46)
AST: 13 U/L (ref 10–35)
Albumin: 4 g/dL (ref 3.6–5.1)
Alkaline phosphatase (APISO): 79 U/L (ref 35–144)
BUN/Creatinine Ratio: 17 (calc) (ref 6–22)
BUN: 25 mg/dL (ref 7–25)
CO2: 28 mmol/L (ref 20–32)
Calcium: 9.4 mg/dL (ref 8.6–10.3)
Chloride: 105 mmol/L (ref 98–110)
Creat: 1.49 mg/dL — ABNORMAL HIGH (ref 0.70–1.18)
GFR, Est African American: 52 mL/min/{1.73_m2} — ABNORMAL LOW (ref >=60)
GFR, Est Non African American: 45 mL/min/{1.73_m2} — ABNORMAL LOW (ref >=60)
Globulin: 2.1 g/dL (calc) (ref 1.9–3.7)
Glucose, Bld: 109 mg/dL — ABNORMAL HIGH (ref 65–99)
Potassium: 3.8 mmol/L (ref 3.5–5.3)
Sodium: 141 mmol/L (ref 135–146)
Total Bilirubin: 0.6 mg/dL (ref 0.2–1.2)
Total Protein: 6.1 g/dL (ref 6.1–8.1)

## 2019-07-25 LAB — VITAMIN D 25 HYDROXY (VIT D DEFICIENCY, FRACTURES): Vit D, 25-Hydroxy: 68 ng/mL (ref 30–100)

## 2019-07-25 LAB — VITAMIN B12: Vitamin B-12: 724 pg/mL (ref 200–1100)

## 2019-07-25 LAB — TSH: TSH: 2.28 mIU/L (ref 0.40–4.50)

## 2019-07-25 LAB — HEMOGLOBIN A1C
Hgb A1c MFr Bld: 5.2 % of total Hgb (ref <5.7)
Mean Plasma Glucose: 103 (calc)
eAG (mmol/L): 5.7 (calc)

## 2019-07-25 LAB — MAGNESIUM: Magnesium: 2.2 mg/dL (ref 1.5–2.5)

## 2019-07-28 ENCOUNTER — Encounter: Payer: Self-pay | Admitting: Internal Medicine

## 2019-08-01 ENCOUNTER — Encounter: Payer: Self-pay | Admitting: Gastroenterology

## 2019-08-07 DIAGNOSIS — R3915 Urgency of urination: Secondary | ICD-10-CM | POA: Diagnosis not present

## 2019-08-07 DIAGNOSIS — N401 Enlarged prostate with lower urinary tract symptoms: Secondary | ICD-10-CM | POA: Diagnosis not present

## 2019-08-13 DIAGNOSIS — K449 Diaphragmatic hernia without obstruction or gangrene: Secondary | ICD-10-CM | POA: Diagnosis not present

## 2019-08-13 DIAGNOSIS — K5730 Diverticulosis of large intestine without perforation or abscess without bleeding: Secondary | ICD-10-CM | POA: Diagnosis not present

## 2019-08-13 DIAGNOSIS — R1084 Generalized abdominal pain: Secondary | ICD-10-CM | POA: Diagnosis not present

## 2019-08-21 DIAGNOSIS — R3915 Urgency of urination: Secondary | ICD-10-CM | POA: Diagnosis not present

## 2019-08-21 DIAGNOSIS — R972 Elevated prostate specific antigen [PSA]: Secondary | ICD-10-CM | POA: Diagnosis not present

## 2019-08-21 DIAGNOSIS — N401 Enlarged prostate with lower urinary tract symptoms: Secondary | ICD-10-CM | POA: Diagnosis not present

## 2019-08-22 ENCOUNTER — Ambulatory Visit (INDEPENDENT_AMBULATORY_CARE_PROVIDER_SITE_OTHER): Payer: Medicare HMO | Admitting: Internal Medicine

## 2019-08-22 ENCOUNTER — Other Ambulatory Visit: Payer: Self-pay

## 2019-08-22 VITALS — BP 104/62 | HR 64 | Temp 97.6°F | Resp 16 | Ht 72.0 in | Wt 180.0 lb

## 2019-08-22 DIAGNOSIS — R0989 Other specified symptoms and signs involving the circulatory and respiratory systems: Secondary | ICD-10-CM | POA: Diagnosis not present

## 2019-08-22 DIAGNOSIS — I951 Orthostatic hypotension: Secondary | ICD-10-CM

## 2019-08-23 ENCOUNTER — Ambulatory Visit: Payer: Medicare HMO | Admitting: Internal Medicine

## 2019-08-25 ENCOUNTER — Encounter: Payer: Self-pay | Admitting: Internal Medicine

## 2019-08-25 NOTE — Progress Notes (Signed)
     Subjective:    Patient ID: Mathew Phi., male    DOB: 06-Nov-1945, 73 y.o.   MRN: [id number]  HPI   Patient is a nice 73 yo [name] with HTN, HLD, PreDM, GERD,  Mild Vascular Dementia Who recently had developed symptomatic postural hypotension for which he was Rx'd Florinef.  On Florinef, wife reports BP's still running low in the range 82-88/50-60 with him having tendency to faint.    Medication Sig  . VITAMIN D3 Take 2,500 Units by mouth daily. Take 1/2 tablet daily  . FLORINEF 0.1 MG tablet Take 1 to 2 tablets to maintain Standing BP over 100  . STOOL SOFTENER/LAXATIVE 8.6-50 MG  TAKE 1 TABLET EVERY DAY  . Magnesium 250 MG TABS Take 250 mg by mouth daily.  Marland Kitchen MYRBETRIQ 50 MG TB24 Take 1 tablet (50 mg total) by mouth daily.  Marland Kitchen omeprazole  20 MG capsule Take 1 capsule 2 x /day  . PAZEO 0.7 % SOLN   . rosuvastatin  5 MG tablet Take 1 tablet (5 mg total) by mouth at bedtime.  Marland Kitchen FLOMAX 0.4 MG CAPS capsule Take 1 capsule  daily.  Marland Kitchen triamcinolone crm 0.1 % Apply sparingly 2 to 3 x day  . vitamin B-12  500 MCG tablet Dissolve 1 tablet under tongue daily    Allergies  Allergen Reactions  . Hydrocodone-Acetaminophen Nausea Only  . Sudafed [Pseudoephedrine Hcl]     Urinary retention  . Tetanus Toxoids     Itching, burning feeling, ran a fever   Past Medical History:  Diagnosis Date  . Constipation   . Hyperlipidemia   . Hypertension   . Prediabetes    no meds   . Vitamin D deficiency    Past Surgical History:  Procedure Laterality Date  . CATARACT EXTRACTION, BILATERAL  2017  . COLONOSCOPY    . NO PAST SURGERIES     Review of Systems    10 point systems review negative except as above.    Objective:   Physical Exam  BP 104/62   Pulse 64   Temp 97.6 F (36.4 C)   Resp 16   Postural Sit BP 131/97   P 666     & Stand BP 145/58    P 72  HEENT - WNL. Neck - supple.  Chest - Clear equal BS. Cor - Nl HS. RRR w/o sig MGR. PP 1(+). No edema. MS- FROM w/o  deformities.  Gait Nl. Neuro -  Nl w/o focal abnormalities.    Assessment & Plan:   1. Autonomic postural hypotension  - Wife advised to stop his Flomax & Myrbetriq & monitor for  LUTS of BPH/BOO.  2. Labile hypertension

## 2019-08-28 ENCOUNTER — Ambulatory Visit: Payer: Medicare HMO | Admitting: Internal Medicine

## 2019-09-04 NOTE — Progress Notes (Signed)
   Subjective:    Patient ID: Mathew Phi., male    DOB: 12/14/1945, 73 y.o.   MRN: [id number]  HPI    This is a 2 week f/u for this nice 73 yo [name] with HTN, HLD, PreDM, GERD,  Mild Vascular Dementia who was started on Florinef for postural hypotension.  On Florinef - now up to 5 tabs /day & wife reports he's still having significant postural BP drops to the 70-80/50-60 range w/o reflex tachycardia.  Patient becomes weak & unsteady and is using a walker & is aware of presyncopal precautions.   Medication Sig  . VITAMIN D    2,500 Units  Take by mouth daily. Take 1/2 tablet daily  . fludrocortisone (FLORINEF) 0.1 MG tab Take 1 to 2 tablets to maintain Standing BP over 100  . GNP STOOL SOFTENER/LAXATIVE  TAKE 1 TABLET EVERY DAY  . Magnesium 250 MG TABS Take 250 mg  daily.  Marland Kitchen omeprazole20 MG capsule Take 1 capsule 2 x /day  . PAZEO 0.7 % SOLN   . Rosuvastatin 5 MG tablet Take 1 tablet  at bedtime.  . triamcinolone crm  0.1 % Apply sparingly 2 to 3 x day  . vitamin B-12  500 MCG tab Dissolve 1 tablet under tongue daily    Allergies  Allergen Reactions  . Hydrocodone-Acetaminophen Nausea Only  . Sudafed [Pseudoephedrine Hcl]     Urinary retention  . Tetanus Toxoids     Itching, burning feeling, ran a fever   Past Medical History:  Diagnosis Date  . Constipation   . Hyperlipidemia   . Hypertension   . Prediabetes    no meds   . Vitamin D deficiency    Review of Systems    10 point systems review negative except as above.    Objective:   Physical Exam  BP  sitting-110/74 & standing-  86/56  P  56   T 97.9 F   R 16   Ht 6\' 2"   Wt 190 lb  BMI 24.39  Postural Sit BP 114/.66    P   63    &  Stand  BP 87/51   P  68   HEENT - WNL. Neck - supple.  Chest - Clear equal BS. Cor - Nl HS. RRR w/o sig MGR. PP 1(+). No edema. MS- FROM w/o deformities.  Gait Nl. Neuro -  Nl w/o focal abnormalities.    Assessment & Plan:   1. Autonomic postural hypotension  - CBC with  Diff - COMPLETE METABOLIC PANEL WITH GFR  - midodrine (PROAMATINE) 5 MG tablet; Take 1 tablet 3 x /day for Low BP  Dispense: 90 tablet; Refill: 1  - fludrocortisone (FLORINEF) 0.1 MG tablet; Take 2 tablets to maintain Standing BP over 100  Dispense: 120 tablet; Refill: 1  2. Medication management  - CBC with Diff - COMPLETE METABOLIC PANEL WITH GFR  - Discussed continued postural BP monitoring &  titration of meds .  - ROV 2 weeks

## 2019-09-05 ENCOUNTER — Encounter: Payer: Self-pay | Admitting: Internal Medicine

## 2019-09-05 ENCOUNTER — Ambulatory Visit (INDEPENDENT_AMBULATORY_CARE_PROVIDER_SITE_OTHER): Payer: Medicare HMO | Admitting: Internal Medicine

## 2019-09-05 ENCOUNTER — Other Ambulatory Visit: Payer: Self-pay

## 2019-09-05 VITALS — BP 110/74 | HR 56 | Temp 97.9°F | Resp 16 | Ht 74.0 in | Wt 190.0 lb

## 2019-09-05 DIAGNOSIS — I951 Orthostatic hypotension: Secondary | ICD-10-CM | POA: Diagnosis not present

## 2019-09-05 DIAGNOSIS — Z79899 Other long term (current) drug therapy: Secondary | ICD-10-CM

## 2019-09-05 MED ORDER — MIDODRINE HCL 5 MG PO TABS: ORAL_TABLET | ORAL | 1 refills | Status: DC

## 2019-09-05 MED ORDER — FLUDROCORTISONE ACETATE 0.1 MG PO TABS: ORAL_TABLET | ORAL | 1 refills | Status: DC

## 2019-09-05 NOTE — Patient Instructions (Signed)

## 2019-09-06 ENCOUNTER — Other Ambulatory Visit: Payer: Self-pay | Admitting: Internal Medicine

## 2019-09-06 DIAGNOSIS — E876 Hypokalemia: Secondary | ICD-10-CM

## 2019-09-06 LAB — COMPLETE METABOLIC PANEL WITH GFR
AG Ratio: 2.2 (calc) (ref 1.0–2.5)
ALT: 9 U/L (ref 9–46)
AST: 11 U/L (ref 10–35)
Albumin: 3.7 g/dL (ref 3.6–5.1)
Alkaline phosphatase (APISO): 71 U/L (ref 35–144)
BUN/Creatinine Ratio: 15 (calc) (ref 6–22)
BUN: 19 mg/dL (ref 7–25)
CO2: 34 mmol/L — ABNORMAL HIGH (ref 20–32)
Calcium: 8.7 mg/dL (ref 8.6–10.3)
Chloride: 103 mmol/L (ref 98–110)
Creat: 1.28 mg/dL — ABNORMAL HIGH (ref 0.70–1.18)
GFR, Est African American: 63 mL/min/{1.73_m2} (ref >=60)
GFR, Est Non African American: 54 mL/min/{1.73_m2} — ABNORMAL LOW (ref >=60)
Globulin: 1.7 g/dL (calc) — ABNORMAL LOW (ref 1.9–3.7)
Glucose, Bld: 165 mg/dL — ABNORMAL HIGH (ref 65–99)
Potassium: 2.8 mmol/L — ABNORMAL LOW (ref 3.5–5.3)
Sodium: 144 mmol/L (ref 135–146)
Total Bilirubin: 0.6 mg/dL (ref 0.2–1.2)
Total Protein: 5.4 g/dL — ABNORMAL LOW (ref 6.1–8.1)

## 2019-09-06 LAB — CBC WITH DIFFERENTIAL/PLATELET
Absolute Monocytes: 611 cells/uL (ref 200–950)
Basophils Absolute: 38 cells/uL (ref 0–200)
Basophils Relative: 0.4 %
Eosinophils Absolute: 56 cells/uL (ref 15–500)
Eosinophils Relative: 0.6 %
HCT: 42.2 % (ref 38.5–50.0)
Hemoglobin: 14.2 g/dL (ref 13.2–17.1)
Lymphs Abs: 1241 cells/uL (ref 850–3900)
MCH: 29.9 pg (ref 27.0–33.0)
MCHC: 33.6 g/dL (ref 32.0–36.0)
MCV: 88.8 fL (ref 80.0–100.0)
MPV: 11 fL (ref 7.5–12.5)
Monocytes Relative: 6.5 %
Neutro Abs: 7454 cells/uL (ref 1500–7800)
Neutrophils Relative %: 79.3 %
Platelets: 166 10*3/uL (ref 140–400)
RBC: 4.75 10*6/uL (ref 4.20–5.80)
RDW: 13.2 % (ref 11.0–15.0)
Total Lymphocyte: 13.2 %
WBC: 9.4 10*3/uL (ref 3.8–10.8)

## 2019-09-06 MED ORDER — POTASSIUM CHLORIDE CRYS ER 20 MEQ PO TBCR: EXTENDED_RELEASE_TABLET | ORAL | 0 refills | Status: DC

## 2019-09-14 ENCOUNTER — Other Ambulatory Visit: Payer: Self-pay | Admitting: Internal Medicine

## 2019-09-14 DIAGNOSIS — I951 Orthostatic hypotension: Secondary | ICD-10-CM

## 2019-09-14 MED ORDER — FLUDROCORTISONE ACETATE 0.1 MG PO TABS: ORAL_TABLET | ORAL | 1 refills | Status: DC

## 2019-09-23 ENCOUNTER — Other Ambulatory Visit: Payer: Self-pay | Admitting: Internal Medicine

## 2019-09-23 DIAGNOSIS — I951 Orthostatic hypotension: Secondary | ICD-10-CM

## 2019-09-23 MED ORDER — MIDODRINE HCL 5 MG PO TABS: ORAL_TABLET | ORAL | 1 refills | Status: DC

## 2019-10-01 ENCOUNTER — Encounter: Payer: Self-pay | Admitting: Internal Medicine

## 2019-10-01 DIAGNOSIS — R2681 Unsteadiness on feet: Secondary | ICD-10-CM | POA: Insufficient documentation

## 2019-10-01 DIAGNOSIS — R3915 Urgency of urination: Secondary | ICD-10-CM | POA: Diagnosis not present

## 2019-10-01 DIAGNOSIS — N401 Enlarged prostate with lower urinary tract symptoms: Secondary | ICD-10-CM | POA: Diagnosis not present

## 2019-10-01 DIAGNOSIS — R296 Repeated falls: Secondary | ICD-10-CM | POA: Insufficient documentation

## 2019-10-09 ENCOUNTER — Other Ambulatory Visit: Payer: Self-pay

## 2019-10-09 ENCOUNTER — Ambulatory Visit (INDEPENDENT_AMBULATORY_CARE_PROVIDER_SITE_OTHER): Payer: Medicare HMO | Admitting: Internal Medicine

## 2019-10-09 VITALS — BP 142/90 | HR 68 | Temp 97.8°F | Resp 16 | Ht 74.0 in | Wt 169.2 lb

## 2019-10-09 DIAGNOSIS — R296 Repeated falls: Secondary | ICD-10-CM | POA: Diagnosis not present

## 2019-10-09 DIAGNOSIS — R2681 Unsteadiness on feet: Secondary | ICD-10-CM

## 2019-10-09 DIAGNOSIS — R5383 Other fatigue: Secondary | ICD-10-CM | POA: Diagnosis not present

## 2019-10-09 DIAGNOSIS — I951 Orthostatic hypotension: Secondary | ICD-10-CM

## 2019-10-09 DIAGNOSIS — Z79899 Other long term (current) drug therapy: Secondary | ICD-10-CM

## 2019-10-09 NOTE — Progress Notes (Signed)
    Subjective:    Patient ID: Mathew Phi., male    DOB: 10-Feb-1946, 73 y.o.   MRN: [id number]  HPI    This is a 1 month f/u for this 73 yo [name] with HTN, HLD, PreDM, GERD, Mild Vascular Dementia with Dysautonomia and orthostatic hypotension who was initially treated with Florinef  With only minimal improvement in his orthostasis, so Midodrine was added and dosing increased gradually up to 60 mg /day due to a mis-communication of caretaker wife not understanding directions and when discovered they were advised to taper dose to 10 mg 3 x /day. Despite this patient appears to have minimal improvement in his orthostasis.     Outpatient Medications Prior to Visit  Medication Sig Dispense Refill  . ACCU-CHEK SOFTCLIX LANCETS lancets Check blood sugar 1 time a day.DX-R73.03 100 each 1  . Alcohol Swabs (B-D SINGLE USE SWABS REGULAR) PADS Check blood sugar 1 time daily-DX-R73.03 100 each 4  . Blood Glucose Monitoring Suppl (ACCU-CHEK AVIVA PLUS) w/Device KIT Check blood sugar 1 time a day-DX-R73.03 1 kit 0  . Cholecalciferol (VITAMIN D3 PO) Take 2,500 Units by mouth daily. Take 1/2 tablet daily    . fludrocortisone (FLORINEF) 0.1 MG tablet Take 2 tablets 3 x/day  to maintain Standing BP over 100 180 tablet 1  . glucose blood (ACCU-CHEK AVIVA PLUS) test strip Check blood sugar 1 time a day-DX-R73.03 100 each 1  . GNP STOOL SOFTENER/LAXATIVE 8.6-50 MG tablet TAKE 1 TABLET EVERY DAY 90 tablet 1  . Magnesium 250 MG TABS Take 250 mg by mouth daily.    . midodrine (PROAMATINE) 5 MG tablet Take 1 tablet 3 x /day for Low BP 270 tablet 1  . omeprazole (PRILOSEC) 20 MG capsule Take 1 capsule 2 x /day for Acid Indigestion & Heart burn 180 capsule 3  . PAZEO 0.7 % SOLN     . potassium chloride SA (KLOR-CON) 20 MEQ tablet Take 1 tablet 2 x /day for Potassium 180 tablet 0  . rosuvastatin (CRESTOR) 5 MG tablet Take 1 tablet (5 mg total) by mouth at bedtime. 90 tablet 1  . triamcinolone cream (KENALOG) 0.1 %  Apply sparingly 2 to 3 x day (do not use on face) 80 g 3  . vitamin B-12 (CYANOCOBALAMIN) 500 MCG tablet Dissolve 1 tablet under tongue daily 90 tablet 3   No facility-administered medications prior to visit.   Review of Systems     Objective:   Physical Exam  BP 142/90-sitting and 108/76-standing  P 68   T 97.8 F    R 16   Ht _0     Wt 169 lb    BMI 21.72   HEENT - WNL. Neck - supple.  Chest - Clear equal BS. Cor - Nl HS. RRR w/o sig MGR. PP 1(+). No edema. MS- FROM w/o deformities.  Gait Nl. Neuro -  Nl w/o focal abnormalities.    Assessment & Plan:   1. Autonomic postural hypotension  - CBC with Diff - COMPLETE METABOLIC PANEL WITH GFR - Cortisol - ACTH  2. Unstable gait   3. Fatigue / Weakness  - CBC with Diff - COMPLETE METABOLIC PANEL WITH GFR  4. Medication management  - CBC with Diff - COMPLETE METABOLIC PANEL WITH GFR - Cortisol - ACTH  - Discussed meds /SE's and continued postural BP monitoring

## 2019-10-09 NOTE — Patient Instructions (Addendum)
Orthostatic Hypotension °Blood pressure is a measurement of how strongly, or weakly, your blood is pressing against the walls of your arteries. Orthostatic hypotension is a sudden drop in blood pressure that happens when you quickly change positions, such as when you get up from sitting or lying down. °Arteries are blood vessels that carry blood from your heart throughout your body. When blood pressure is too low, you may not get enough blood to your brain or to the rest of your organs. This can cause weakness, light-headedness, rapid heartbeat, and fainting. This can last for just a few seconds or for up to a few minutes. Orthostatic hypotension is usually not a serious problem. However, if it happens frequently or gets worse, it may be a sign of something more serious. °What are the causes? °This condition may be caused by: °· Sudden changes in posture, such as standing up quickly after you have been sitting or lying down. °· Blood loss. °· Loss of body fluids (dehydration). °· Heart problems. °· Hormone (endocrine) problems. °· Pregnancy. °· Severe infection. °· Lack of certain nutrients. °· Severe allergic reactions (anaphylaxis). °· Certain medicines, such as blood pressure medicine or medicines that make the body lose excess fluids (diuretics). Sometimes, this condition can be caused by not taking medicine as directed, such as taking too much of a certain medicine. °What increases the risk? °The following factors may make you more likely to develop this condition: °· Age. Risk increases as you get older. °· Conditions that affect the heart or the central nervous system. °· Taking certain medicines, such as blood pressure medicine or diuretics. °· Being pregnant. °What are the signs or symptoms? °Symptoms of this condition may include: °· Weakness. °· Light-headedness. °· Dizziness. °· Blurred vision. °· Fatigue. °· Rapid heartbeat. °· Fainting, in severe cases. °How is this diagnosed? °This condition is  diagnosed based on: °· Your medical history. °· Your symptoms. °· Your blood pressure measurement. Your health care provider will check your blood pressure when you are: °? Lying down. °? Sitting. °? Standing. °A blood pressure reading is recorded as two numbers, such as "120 over 80" (or 120/80). The first ("top") number is called the systolic pressure. It is a measure of the pressure in your arteries as your heart beats. The second ("bottom") number is called the diastolic pressure. It is a measure of the pressure in your arteries when your heart relaxes between beats. Blood pressure is measured in a unit called mm Hg. Healthy blood pressure for most adults is 120/80. If your blood pressure is below 90/60, you may be diagnosed with hypotension. °Other information or tests that may be used to diagnose orthostatic hypotension include: °· Your other vital signs, such as your heart rate and temperature. °· Blood tests. °· Tilt table test. For this test, you will be safely secured to a table that moves you from a lying position to an upright position. Your heart rhythm and blood pressure will be monitored during the test. °How is this treated? °This condition may be treated by: °· Changing your diet. This may involve eating more salt (sodium) or drinking more water. °· Taking medicines to raise your blood pressure. °· Changing the dosage of certain medicines you are taking that might be lowering your blood pressure. °· Wearing compression stockings. These stockings help to prevent blood clots and reduce swelling in your legs. °In some cases, you may need to go to the hospital for: °· Fluid replacement. This means you will   receive fluids through an IV. °· Blood replacement. This means you will receive donated blood through an IV (transfusion). °· Treating an infection or heart problems, if this applies. °· Monitoring. You may need to be monitored while medicines that you are taking wear off. °Follow these instructions  at home: °Eating and drinking ° °· Drink enough fluid to keep your urine pale yellow. °· Eat a healthy diet, and follow instructions from your health care provider about eating or drinking restrictions. A healthy diet includes: °? Fresh fruits and vegetables. °? Whole grains. °? Lean meats. °? Low-fat dairy products. °· Eat extra salt only as directed. Do not add extra salt to your diet unless your health care provider told you to do that. °· Eat frequent, small meals. °· Avoid standing up suddenly after eating. °Medicines °· Take over-the-counter and prescription medicines only as told by your health care provider. °? Follow instructions from your health care provider about changing the dosage of your current medicines, if this applies. °? Do not stop or adjust any of your medicines on your own. °General instructions ° °· Wear compression stockings as told by your health care provider. °· Get up slowly from lying down or sitting positions. This gives your blood pressure a chance to adjust. °· Avoid hot showers and excessive heat as directed by your health care provider. °· Return to your normal activities as told by your health care provider. Ask your health care provider what activities are safe for you. °· Do not use any products that contain nicotine or tobacco, such as cigarettes, e-cigarettes, and chewing tobacco. If you need help quitting, ask your health care provider. °· Keep all follow-up visits as told by your health care provider. This is important. °Contact a health care provider if you: °· Vomit. °· Have diarrhea. °· Have a fever for more than 2-3 days. °· Feel more thirsty than usual. °· Feel weak and tired. °Get help right away if you: °· Have chest pain. °· Have a fast or irregular heartbeat. °· Develop numbness in any part of your body. °· Cannot move your arms or your legs. °· Have trouble speaking. °· Become sweaty or feel light-headed. °· Faint. °· Feel short of breath. °· Have trouble staying  awake. °· Feel confused. °Summary °· Orthostatic hypotension is a sudden drop in blood pressure that happens when you quickly change positions. °· Orthostatic hypotension is usually not a serious problem. °· It is diagnosed by having your blood pressure taken lying down, sitting, and then standing. °· It may be treated by changing your diet or adjusting your medicines. °This information is not intended to replace advice given to you by your health care provider. Make sure you discuss any questions you have with your health care provider. °Document Released: 09/24/2002 Document Revised: 03/30/2018 Document Reviewed: 03/30/2018 °Elsevier Patient Education © 2020 [employer] ° °

## 2019-10-10 ENCOUNTER — Other Ambulatory Visit: Payer: Self-pay | Admitting: Internal Medicine

## 2019-10-10 DIAGNOSIS — E876 Hypokalemia: Secondary | ICD-10-CM

## 2019-10-10 DIAGNOSIS — Z79899 Other long term (current) drug therapy: Secondary | ICD-10-CM

## 2019-10-10 DIAGNOSIS — I951 Orthostatic hypotension: Secondary | ICD-10-CM

## 2019-10-11 LAB — COMPLETE METABOLIC PANEL WITH GFR
AG Ratio: 2 (calc) (ref 1.0–2.5)
ALT: 13 U/L (ref 9–46)
AST: 13 U/L (ref 10–35)
Albumin: 4.2 g/dL (ref 3.6–5.1)
Alkaline phosphatase (APISO): 81 U/L (ref 35–144)
BUN/Creatinine Ratio: 14 (calc) (ref 6–22)
BUN: 23 mg/dL (ref 7–25)
CO2: 35 mmol/L — ABNORMAL HIGH (ref 20–32)
Calcium: 9.8 mg/dL (ref 8.6–10.3)
Chloride: 103 mmol/L (ref 98–110)
Creat: 1.64 mg/dL — ABNORMAL HIGH (ref 0.70–1.18)
GFR, Est African American: 47 mL/min/{1.73_m2} — ABNORMAL LOW (ref >=60)
GFR, Est Non African American: 40 mL/min/{1.73_m2} — ABNORMAL LOW (ref >=60)
Globulin: 2.1 g/dL (calc) (ref 1.9–3.7)
Glucose, Bld: 141 mg/dL — ABNORMAL HIGH (ref 65–99)
Potassium: 2.9 mmol/L — ABNORMAL LOW (ref 3.5–5.3)
Sodium: 147 mmol/L — ABNORMAL HIGH (ref 135–146)
Total Bilirubin: 1.1 mg/dL (ref 0.2–1.2)
Total Protein: 6.3 g/dL (ref 6.1–8.1)

## 2019-10-11 LAB — CBC WITH DIFFERENTIAL/PLATELET
Absolute Monocytes: 667 cells/uL (ref 200–950)
Basophils Absolute: 58 cells/uL (ref 0–200)
Basophils Relative: 0.5 %
Eosinophils Absolute: 58 cells/uL (ref 15–500)
Eosinophils Relative: 0.5 %
HCT: 44.6 % (ref 38.5–50.0)
Hemoglobin: 15.4 g/dL (ref 13.2–17.1)
Lymphs Abs: 1622 cells/uL (ref 850–3900)
MCH: 31.2 pg (ref 27.0–33.0)
MCHC: 34.5 g/dL (ref 32.0–36.0)
MCV: 90.5 fL (ref 80.0–100.0)
MPV: 11 fL (ref 7.5–12.5)
Monocytes Relative: 5.8 %
Neutro Abs: 9097 cells/uL — ABNORMAL HIGH (ref 1500–7800)
Neutrophils Relative %: 79.1 %
Platelets: 187 10*3/uL (ref 140–400)
RBC: 4.93 10*6/uL (ref 4.20–5.80)
RDW: 13.3 % (ref 11.0–15.0)
Total Lymphocyte: 14.1 %
WBC: 11.5 10*3/uL — ABNORMAL HIGH (ref 3.8–10.8)

## 2019-10-11 LAB — ACTH: C206 ACTH: 13 pg/mL (ref 6–50)

## 2019-10-11 LAB — CORTISOL: Cortisol, Plasma: 21.7 ug/dL

## 2019-10-13 ENCOUNTER — Encounter: Payer: Self-pay | Admitting: Internal Medicine

## 2019-10-17 NOTE — Progress Notes (Signed)
    Subjective:    Patient ID: Mathew Phi., male    DOB: [birth year], 73 y.o.   MRN: [id number]  HPI   Patient is a 73 yo [name] with Moderately severe Dysautonomia and orthostatic hypotensionand also Hx/o HTN, HLD, PreDM, GERD, mild Vascular Dementia who is on maximal therapy for his Hypotension with Midodrine 10 mg tid and Florinef 0.1 mg x 4 tabs /day.  Wife reports patient has had elevated sitting BP's, But has NOT fallen in about 2 weeks.   Recent potassium was 2.9 and his KCL 20 meq tabs were increased to 2 tabs tid.  Medication Sig  . VITAMIN D Take 2,500 Units  daily. Take 1/2 tablet daily  . FLORINEF 0.1 MG  Take 2 tablets 3 x/day  to maintain Standing BP over 100  . GNP STOOL SOFTENER/LAXATIVE  TAKE 1 TABLET EVERY DAY  . Magnesium 250 MG  Take  daily.  . midodrine  5 MG Take 1 tablet 3 x /day for Low BP  . omeprazole  20 MG  Take 1 capsule 2 x /day for Acid Indigestion & Heart burn  . PAZEO 0.7 % SOLN   . potassium chloride  20 MEQ Take 1 tablet 2 x /day for Potassium  . rosuvastatin  5 MG  Take 1 tablet  at bedtime.  . triamcinolone cream  0.1 % Apply sparingly 2 to 3 x day (do not use on face)  . vitamin B-12 500 MCG tablet Dissolve 1 tablet under tongue daily   Allergies  Allergen Reactions  . Hydrocodone-Acetaminophen Nausea Only  . Sudafed [Pseudoephedrine Hcl]     Urinary retention  . Tetanus Toxoids     Itching, burning feeling, ran a fever   Past Medical History:  Diagnosis Date  . Constipation   . Hyperlipidemia   . Hypertension   . Prediabetes    no meds   . Vitamin D deficiency     Review of Systems   10 point systems review negative except as above.    Objective:   Physical Exam  BP  172/110-sitting and 138/92-standing  P 64   T97 F    R 16   Ht 6\' 2"    Wt 171 lb 9.6 oz   BMI 22.03   HEENT - WNL. Neck - supple.  Chest - Clear equal BS. Cor - Nl HS. RRR w/o sig MGR. PP 1(+). No edema. MS- FROM w/o deformities.  Gait Nl. Neuro -  Nl  w/o focal abnormalities.    Assessment & Plan:         1. Autonomic postural hypotension  2. Hypokalemia  - BASIC METABOLIC PANEL WITH GFR - Magnesium  3. Labile hypertension  - BASIC METABOLIC PANEL WITH GFR - Magnesium  4. Medication management  - BASIC METABOLIC PANEL WITH GFR - Magnesium  - Discussed meds & SE's - Recommended continue meds same

## 2019-10-18 ENCOUNTER — Ambulatory Visit (INDEPENDENT_AMBULATORY_CARE_PROVIDER_SITE_OTHER): Payer: Medicare HMO | Admitting: Internal Medicine

## 2019-10-18 ENCOUNTER — Other Ambulatory Visit: Payer: Self-pay

## 2019-10-18 VITALS — BP 172/110 | HR 64 | Temp 97.0°F | Resp 16 | Ht 74.0 in | Wt 171.6 lb

## 2019-10-18 DIAGNOSIS — R0989 Other specified symptoms and signs involving the circulatory and respiratory systems: Secondary | ICD-10-CM

## 2019-10-18 DIAGNOSIS — E876 Hypokalemia: Secondary | ICD-10-CM | POA: Diagnosis not present

## 2019-10-18 DIAGNOSIS — Z79899 Other long term (current) drug therapy: Secondary | ICD-10-CM

## 2019-10-18 DIAGNOSIS — I951 Orthostatic hypotension: Secondary | ICD-10-CM | POA: Diagnosis not present

## 2019-10-18 MED ORDER — QUETIAPINE FUMARATE 25 MG PO TABS: ORAL_TABLET | ORAL | 0 refills | Status: DC

## 2019-10-19 ENCOUNTER — Encounter: Payer: Self-pay | Admitting: Internal Medicine

## 2019-10-19 ENCOUNTER — Other Ambulatory Visit: Payer: Self-pay | Admitting: Internal Medicine

## 2019-10-19 DIAGNOSIS — I951 Orthostatic hypotension: Secondary | ICD-10-CM

## 2019-10-19 LAB — BASIC METABOLIC PANEL WITH GFR
BUN/Creatinine Ratio: 17 (calc) (ref 6–22)
BUN: 25 mg/dL (ref 7–25)
CO2: 30 mmol/L (ref 20–32)
Calcium: 9.6 mg/dL (ref 8.6–10.3)
Chloride: 107 mmol/L (ref 98–110)
Creat: 1.47 mg/dL — ABNORMAL HIGH (ref 0.70–1.18)
GFR, Est African American: 53 mL/min/{1.73_m2} — ABNORMAL LOW (ref >=60)
GFR, Est Non African American: 46 mL/min/{1.73_m2} — ABNORMAL LOW (ref >=60)
Glucose, Bld: 123 mg/dL — ABNORMAL HIGH (ref 65–99)
Potassium: 4.7 mmol/L (ref 3.5–5.3)
Sodium: 143 mmol/L (ref 135–146)

## 2019-10-19 LAB — MAGNESIUM: Magnesium: 2.7 mg/dL — ABNORMAL HIGH (ref 1.5–2.5)

## 2019-10-19 MED ORDER — FLUDROCORTISONE ACETATE 0.1 MG PO TABS: ORAL_TABLET | ORAL | 1 refills | Status: DC

## 2019-10-20 ENCOUNTER — Other Ambulatory Visit: Payer: Self-pay | Admitting: Physician Assistant

## 2019-10-24 ENCOUNTER — Ambulatory Visit: Payer: Self-pay | Admitting: Physician Assistant

## 2019-10-24 ENCOUNTER — Ambulatory Visit: Payer: Self-pay | Admitting: Internal Medicine

## 2019-10-29 DIAGNOSIS — N401 Enlarged prostate with lower urinary tract symptoms: Secondary | ICD-10-CM | POA: Diagnosis not present

## 2019-10-29 DIAGNOSIS — R351 Nocturia: Secondary | ICD-10-CM | POA: Diagnosis not present

## 2019-11-03 ENCOUNTER — Other Ambulatory Visit: Payer: Self-pay | Admitting: Internal Medicine

## 2019-11-03 DIAGNOSIS — I951 Orthostatic hypotension: Secondary | ICD-10-CM

## 2019-11-03 MED ORDER — MIDODRINE HCL 10 MG PO TABS: ORAL_TABLET | ORAL | 3 refills | Status: DC

## 2019-11-04 ENCOUNTER — Ambulatory Visit: Payer: Medicare HMO | Attending: Internal Medicine

## 2019-11-04 DIAGNOSIS — Z23 Encounter for immunization: Secondary | ICD-10-CM | POA: Insufficient documentation

## 2019-11-04 NOTE — Progress Notes (Signed)
   [id number]  Name:  Mathew Miller.    MRN: [id number] DOB: [birth year]  11/04/2019  [name] was observed post Covid-19 immunization for 12 MINS without incidence. He was provided with Vaccine Information Sheet and instruction to access the V-Safe system.   [name] was instructed to call 911 with any severe reactions post vaccine: Marland Kitchen Difficulty breathing  . Swelling of your face and throat  . A fast heartbeat  . A bad rash all over your body  . Dizziness and weakness

## 2019-11-08 ENCOUNTER — Ambulatory Visit: Payer: Medicare HMO

## 2019-11-14 ENCOUNTER — Ambulatory Visit: Payer: Medicare HMO | Admitting: Physician Assistant

## 2019-11-15 ENCOUNTER — Other Ambulatory Visit: Payer: Self-pay

## 2019-11-15 MED ORDER — QUETIAPINE FUMARATE 50 MG PO TABS: ORAL_TABLET | ORAL | 1 refills | Status: DC

## 2019-11-23 ENCOUNTER — Other Ambulatory Visit: Payer: Self-pay | Admitting: Physician Assistant

## 2019-11-23 DIAGNOSIS — R413 Other amnesia: Secondary | ICD-10-CM

## 2019-11-23 DIAGNOSIS — E782 Mixed hyperlipidemia: Secondary | ICD-10-CM

## 2019-11-24 ENCOUNTER — Other Ambulatory Visit: Payer: Self-pay

## 2019-11-24 DIAGNOSIS — E876 Hypokalemia: Secondary | ICD-10-CM

## 2019-11-24 MED ORDER — POTASSIUM CHLORIDE CRYS ER 20 MEQ PO TBCR: EXTENDED_RELEASE_TABLET | ORAL | 3 refills | Status: DC

## 2019-11-25 ENCOUNTER — Ambulatory Visit: Payer: Medicare HMO | Attending: Internal Medicine

## 2019-11-25 DIAGNOSIS — Z23 Encounter for immunization: Secondary | ICD-10-CM | POA: Insufficient documentation

## 2019-11-25 NOTE — Progress Notes (Signed)
   [id number] [facility]  Name:  Mathew Miller.    MRN: [id number] DOB: 06-09-1946  11/25/2019  [name] was observed post Covid-19 immunization for 15 minutes without incidence. He was provided with Vaccine Information Sheet and instruction to access the V-Safe system.   [name] was instructed to call 911 with any severe reactions post vaccine: Marland Kitchen Difficulty breathing  . Swelling of your face and throat  . A fast heartbeat  . A bad rash all over your body  . Dizziness and weakness    Immunizations Administered    Name Date Dose VIS Date Route   Pfizer COVID-19 Vaccine 11/25/2019 12:47 PM 0.3 mL 09/28/2019 Intramuscular   Manufacturer: [place]   Lot: [id number]   [place]: [id number]

## 2019-12-10 DIAGNOSIS — N401 Enlarged prostate with lower urinary tract symptoms: Secondary | ICD-10-CM | POA: Diagnosis not present

## 2019-12-10 DIAGNOSIS — R3915 Urgency of urination: Secondary | ICD-10-CM | POA: Diagnosis not present

## 2019-12-17 ENCOUNTER — Other Ambulatory Visit: Payer: Self-pay | Admitting: *Deleted

## 2019-12-17 DIAGNOSIS — I951 Orthostatic hypotension: Secondary | ICD-10-CM

## 2019-12-17 DIAGNOSIS — E876 Hypokalemia: Secondary | ICD-10-CM

## 2019-12-17 MED ORDER — ACCU-CHEK AVIVA PLUS VI STRP: ORAL_STRIP | 1 refills | Status: DC

## 2019-12-17 MED ORDER — POTASSIUM CHLORIDE CRYS ER 20 MEQ PO TBCR: EXTENDED_RELEASE_TABLET | ORAL | 3 refills | Status: DC

## 2019-12-17 MED ORDER — BD SWAB SINGLE USE REGULAR PADS: MEDICATED_PAD | 4 refills | Status: DC

## 2019-12-17 MED ORDER — ACCU-CHEK SOFTCLIX LANCETS MISC: 1 refills | Status: DC

## 2019-12-17 MED ORDER — MIDODRINE HCL 10 MG PO TABS: ORAL_TABLET | ORAL | 3 refills | Status: DC

## 2019-12-17 MED ORDER — ACCU-CHEK AVIVA PLUS W/DEVICE KIT: PACK | 0 refills | Status: DC

## 2019-12-20 ENCOUNTER — Other Ambulatory Visit: Payer: Self-pay

## 2019-12-20 ENCOUNTER — Encounter: Payer: Self-pay | Admitting: Internal Medicine

## 2019-12-20 ENCOUNTER — Ambulatory Visit (INDEPENDENT_AMBULATORY_CARE_PROVIDER_SITE_OTHER): Payer: Medicare HMO | Admitting: Internal Medicine

## 2019-12-20 ENCOUNTER — Other Ambulatory Visit: Payer: Self-pay | Admitting: Internal Medicine

## 2019-12-20 VITALS — BP 152/80 | HR 64 | Temp 97.8°F | Resp 16 | Ht 74.0 in | Wt 169.4 lb

## 2019-12-20 DIAGNOSIS — Z79899 Other long term (current) drug therapy: Secondary | ICD-10-CM | POA: Diagnosis not present

## 2019-12-20 DIAGNOSIS — R0989 Other specified symptoms and signs involving the circulatory and respiratory systems: Secondary | ICD-10-CM | POA: Diagnosis not present

## 2019-12-20 DIAGNOSIS — I951 Orthostatic hypotension: Secondary | ICD-10-CM | POA: Diagnosis not present

## 2019-12-20 DIAGNOSIS — E559 Vitamin D deficiency, unspecified: Secondary | ICD-10-CM | POA: Diagnosis not present

## 2019-12-20 DIAGNOSIS — E782 Mixed hyperlipidemia: Secondary | ICD-10-CM

## 2019-12-20 DIAGNOSIS — R7309 Other abnormal glucose: Secondary | ICD-10-CM | POA: Diagnosis not present

## 2019-12-20 NOTE — Progress Notes (Signed)
History of Present Illness:       This very nice 74 y.o. [name] presents for 3 month follow up with HTN, HLD, Pre-Diabetes and Vitamin D Deficiency.  Patient has been evaluated for Dementia in Mar 2019 by Dr Tomi Likens with Brain MRI showing chronic microvascular ischemic changes including old strokes.       Patient is treated for labile HTN complicated by dysautonomic postural hypotension & is requiring maximal doses of Florinef  & Midodrine to maintain a standing BP so he can function.  Today's BP - 152/80-sitting and 138/76-standing. Patient has had no complaints of any cardiac type chest pain, palpitations, dyspnea / orthopnea / PND, claudication, or dependent edema.      Hyperlipidemia is controlled with diet & meds. Patient denies myalgias or other med SE's. Last Lipids were at goal:  Lab Results  Component Value Date   CHOL 123 07/24/2019   HDL 56 07/24/2019   LDLCALC 55 07/24/2019   TRIG 50 07/24/2019   CHOLHDL 2.2 07/24/2019    Also, the patient has history of  PreDiabetes (A1c 6.1%/2012)  and has had no symptoms of reactive hypoglycemia, diabetic polys, paresthesias or visual blurring.  Last A1c was Normal & at goal:  Lab Results  Component Value Date   HGBA1C 5.2 07/24/2019           Further, the patient also has history of Vitamin D Deficiency("33"/2008)  and supplements vitamin D without any suspected side-effects. Last vitamin D was at goal:   Lab Results  Component Value Date   VD25OH 68 07/24/2019    Current Outpatient Medications on File Prior to Visit  Medication Sig  . Accu-Chek Softclix Lancets lancets Check blood sugar 1 time a day.[id number].  Marland Kitchen Alcohol Swabs (B-D SINGLE USE SWABS REGULAR) PADS Check blood sugar 1 time daily-[id number].  . fludrocortisone (FLORINEF) 0.1 MG tablet Take 2 tablets 3 x/day  to maintain Standing BP over 100  . glucose blood (ACCU-CHEK AVIVA PLUS) test strip Check blood sugar 1 time a day-DX-Z87.898  . Magnesium 250 MG TABS  Take 250 mg by mouth daily.  . midodrine (PROAMATINE) 10 MG tablet Take 1 tablet 3 x /day for Low BP  . Mirabegron (MYRBETRIQ PO) Take 1 tablet by mouth daily. Not sure of strenght  . omeprazole (PRILOSEC) 20 MG capsule Take 1 capsule 2 x /day for Acid Indigestion & Heart burn  . PAZEO 0.7 % SOLN   . potassium chloride SA (KLOR-CON) 20 MEQ tablet Take 1 tablet 2 x /day for Potassium  . QUEtiapine (SEROQUEL) 50 MG tablet Take 1 to 2 tablets in late Afternoon for "Sundowning"  . rosuvastatin (CRESTOR) 5 MG tablet Take 1 tablet Daily for Cholesterol  . senna-docusate (GNP STOOL SOFTENER/LAXATIVE) 8.6-50 MG tablet Take 1 tablet Daily for Constipation  . vitamin B-12 (CYANOCOBALAMIN) 500 MCG tablet Dissolve 1 tablet under tongue daily  . Cholecalciferol (VITAMIN D3 PO) Take 2,500 Units by mouth daily. Take 1/2 tablet daily   No current facility-administered medications on file prior to visit.    Allergies  Allergen Reactions  . Hydrocodone-Acetaminophen Nausea Only  . Sudafed [Pseudoephedrine Hcl]     Urinary retention  . Tetanus Toxoids     Itching, burning feeling, ran a fever    PMHx:   Past Medical History:  Diagnosis Date  . Constipation   . Hyperlipidemia   . Hypertension   . Prediabetes    no meds   . Vitamin D  deficiency     Immunization History  Administered Date(s) Administered  . Influenza Split 08/06/2013  . Influenza, High Dose Seasonal PF 07/30/2014, 07/29/2015, 08/11/2016, 07/20/2017, 07/18/2018  . Influenza-Unspecified 07/18/2018, 06/24/2019  . PFIZER SARS-COV-2 Vaccination 11/04/2019, 11/25/2019  . Pneumococcal Conjugate-13 04/23/2016  . Pneumococcal Polysaccharide-23 10/21/2009  . Td 10/18/1965  . Zoster 10/19/2011  . Zoster Recombinat (Shingrix) 03/17/2018, 06/08/2018    Past Surgical History:  Procedure Laterality Date  . CATARACT EXTRACTION, BILATERAL  2017  . COLONOSCOPY    . NO PAST SURGERIES      FHx:    Reviewed / unchanged  SHx:     Reviewed / unchanged   Systems Review:  Constitutional: Denies fever, chills, wt changes, headaches, insomnia, fatigue, night sweats, change in appetite. Eyes: Denies redness, blurred vision, diplopia, discharge, itchy, watery eyes.  ENT: Denies discharge, congestion, post nasal drip, epistaxis, sore throat, earache, hearing loss, dental pain, tinnitus, vertigo, sinus pain, snoring.  CV: Denies chest pain, palpitations, irregular heartbeat, syncope, dyspnea, diaphoresis, orthopnea, PND, claudication or edema. Respiratory: denies cough, dyspnea, DOE, pleurisy, hoarseness, laryngitis, wheezing.  Gastrointestinal: Denies dysphagia, odynophagia, heartburn, reflux, water brash, abdominal pain or cramps, nausea, vomiting, bloating, diarrhea, constipation, hematemesis, melena, hematochezia  or hemorrhoids. Genitourinary: Denies dysuria, frequency, urgency, nocturia, hesitancy, discharge, hematuria or flank pain. Musculoskeletal: Denies arthralgias, myalgias, stiffness, jt. swelling, pain, limping or strain/sprain.  Skin: Denies pruritus, rash, hives, warts, acne, eczema or change in skin lesion(s). Neuro: No weakness, tremor, incoordination, spasms, paresthesia or pain. Psychiatric: Denies confusion, memory loss or sensory loss. Endo: Denies change in weight, skin or hair change.  Heme/Lymph: No excessive bleeding, bruising or enlarged lymph nodes.  Physical Exam  BP (!) 152/80 Comment: 152/80-sitting and 138/76-standing  Pulse 64   Temp 97.8 F (36.6 C)   Resp 16   Ht 6\' 2"  (1.88 m)   Wt 169 lb 6.4 oz (76.8 kg)   BMI 21.75 kg/m   Appears  well nourished, well groomed  and in no distress.  Eyes: PERRLA, EOMs, conjunctiva no swelling or erythema. Sinuses: No frontal/maxillary tenderness ENT/Mouth: EAC's clear, TM's nl w/o erythema, bulging. Nares clear w/o erythema, swelling, exudates. Oropharynx clear without erythema or exudates. Oral hygiene is good. Tongue normal, non obstructing.  Hearing intact.  Neck: Supple. Thyroid not palpable. Car 2+/2+ without bruits, nodes or JVD. Chest: Respirations nl with BS clear & equal w/o rales, rhonchi, wheezing or stridor.  Cor: Heart sounds normal w/ regular rate and rhythm without sig. murmurs, gallops, clicks or rubs. Peripheral pulses normal and equal  without edema.  Abdomen: Soft & bowel sounds normal. Non-tender w/o guarding, rebound, hernias, masses or organomegaly.  Lymphatics: Unremarkable.  Musculoskeletal: Full ROM all peripheral extremities, joint stability, 5/5 strength and normal gait.  Skin: Warm, dry without exposed rashes, lesions or ecchymosis apparent.  Neuro: Cranial nerves intact, reflexes equal bilaterally. Sensory-motor testing grossly intact. Tendon reflexes grossly intact.  Pysch: Alert & oriented x 3.  Insight and judgement nl & appropriate. No ideations.  Assessment and Plan:  1. Labile hypertension  - Continue medication, monitor blood pressure at home.  - Continue DASH diet.  Reminder to go to the ER if any CP,  SOB, nausea, dizziness, severe HA, changes vision/speech.  - CBC with Differential/Platelet - COMPLETE METABOLIC PANEL WITH GFR - Magnesium - TSH  2. Hyperlipidemia, mixed  - Continue diet/meds, exercise,& lifestyle modifications.  - Continue monitor periodic cholesterol/liver & renal functions   - Lipid panel - TSH  3. Abnormal  glucose  - Continue diet, exercise  - Lifestyle modifications.  - Monitor appropriate labs.  - Hemoglobin A1c - Insulin, random  4. Vitamin D deficiency  - Continue supplementation.  - VITAMIN D 25 Hydroxy  5. Autonomic postural hypotension   6. Medication management  - CBC with Differential/Platelet - COMPLETE METABOLIC PANEL WITH GFR - Magnesium - Lipid panel - TSH - Hemoglobin A1c - Insulin, random - VITAMIN D 25 Hydroxy         Discussed  regular exercise, BP monitoring, weight control to achieve/maintain BMI less than 25 and  discussed med and SE's. Recommended labs to assess and monitor clinical status with further disposition pending results of labs.  I discussed the assessment and treatment plan with the patient. The patient was provided an opportunity to ask questions and all were answered. The patient agreed with the plan and demonstrated an understanding of the instructions.  I provided over 30 minutes of exam, counseling, chart review and  complex critical decision making.         The patient was advised to call back or seek an in-person evaluation if the symptoms worsen or if the condition fails to improve as anticipated.   Kirtland Bouchard, MD

## 2019-12-20 NOTE — Patient Instructions (Signed)

## 2019-12-21 LAB — HEMOGLOBIN A1C
Hgb A1c MFr Bld: 5.2 % of total Hgb (ref <5.7)
Mean Plasma Glucose: 103 (calc)
eAG (mmol/L): 5.7 (calc)

## 2019-12-21 LAB — CBC WITH DIFFERENTIAL/PLATELET
Absolute Monocytes: 602 cells/uL (ref 200–950)
Basophils Absolute: 47 cells/uL (ref 0–200)
Basophils Relative: 0.5 %
Eosinophils Absolute: 75 cells/uL (ref 15–500)
Eosinophils Relative: 0.8 %
HCT: 43.2 % (ref 38.5–50.0)
Hemoglobin: 14.5 g/dL (ref 13.2–17.1)
Lymphs Abs: 1194 cells/uL (ref 850–3900)
MCH: 30.1 pg (ref 27.0–33.0)
MCHC: 33.6 g/dL (ref 32.0–36.0)
MCV: 89.6 fL (ref 80.0–100.0)
MPV: 10.9 fL (ref 7.5–12.5)
Monocytes Relative: 6.4 %
Neutro Abs: 7482 cells/uL (ref 1500–7800)
Neutrophils Relative %: 79.6 %
Platelets: 161 10*3/uL (ref 140–400)
RBC: 4.82 10*6/uL (ref 4.20–5.80)
RDW: 13 % (ref 11.0–15.0)
Total Lymphocyte: 12.7 %
WBC: 9.4 10*3/uL (ref 3.8–10.8)

## 2019-12-21 LAB — COMPLETE METABOLIC PANEL WITH GFR
AG Ratio: 1.9 (calc) (ref 1.0–2.5)
ALT: 14 U/L (ref 9–46)
AST: 14 U/L (ref 10–35)
Albumin: 4 g/dL (ref 3.6–5.1)
Alkaline phosphatase (APISO): 74 U/L (ref 35–144)
BUN/Creatinine Ratio: 20 (calc) (ref 6–22)
BUN: 27 mg/dL — ABNORMAL HIGH (ref 7–25)
CO2: 33 mmol/L — ABNORMAL HIGH (ref 20–32)
Calcium: 9.3 mg/dL (ref 8.6–10.3)
Chloride: 108 mmol/L (ref 98–110)
Creat: 1.36 mg/dL — ABNORMAL HIGH (ref 0.70–1.18)
GFR, Est African American: 58 mL/min/{1.73_m2} — ABNORMAL LOW (ref >=60)
GFR, Est Non African American: 50 mL/min/{1.73_m2} — ABNORMAL LOW (ref >=60)
Globulin: 2.1 g/dL (calc) (ref 1.9–3.7)
Glucose, Bld: 104 mg/dL — ABNORMAL HIGH (ref 65–99)
Potassium: 3.8 mmol/L (ref 3.5–5.3)
Sodium: 147 mmol/L — ABNORMAL HIGH (ref 135–146)
Total Bilirubin: 0.5 mg/dL (ref 0.2–1.2)
Total Protein: 6.1 g/dL (ref 6.1–8.1)

## 2019-12-21 LAB — LIPID PANEL
Cholesterol: 134 mg/dL (ref <200)
HDL: 54 mg/dL (ref >=40)
LDL Cholesterol (Calc): 67 mg/dL (calc)
Non-HDL Cholesterol (Calc): 80 mg/dL (calc) (ref <130)
Total CHOL/HDL Ratio: 2.5 (calc) (ref <5.0)
Triglycerides: 52 mg/dL (ref <150)

## 2019-12-21 LAB — MAGNESIUM: Magnesium: 2.4 mg/dL (ref 1.5–2.5)

## 2019-12-21 LAB — VITAMIN D 25 HYDROXY (VIT D DEFICIENCY, FRACTURES): Vit D, 25-Hydroxy: 76 ng/mL (ref 30–100)

## 2019-12-21 LAB — TSH: TSH: 2.1 mIU/L (ref 0.40–4.50)

## 2019-12-21 LAB — INSULIN, RANDOM: Insulin: 7.2 u[IU]/mL

## 2019-12-22 ENCOUNTER — Encounter: Payer: Self-pay | Admitting: Internal Medicine

## 2019-12-29 ENCOUNTER — Other Ambulatory Visit: Payer: Self-pay | Admitting: Internal Medicine

## 2019-12-29 DIAGNOSIS — N3281 Overactive bladder: Secondary | ICD-10-CM

## 2019-12-29 MED ORDER — MIRABEGRON ER 50 MG PO TB24: ORAL_TABLET | ORAL | 3 refills | Status: DC

## 2020-01-01 ENCOUNTER — Other Ambulatory Visit: Payer: Self-pay | Admitting: Internal Medicine

## 2020-01-01 DIAGNOSIS — N3281 Overactive bladder: Secondary | ICD-10-CM

## 2020-01-01 MED ORDER — MIRABEGRON ER 50 MG PO TB24: ORAL_TABLET | ORAL | 0 refills | Status: DC

## 2020-01-09 ENCOUNTER — Other Ambulatory Visit: Payer: Self-pay | Admitting: Internal Medicine

## 2020-01-09 DIAGNOSIS — N3281 Overactive bladder: Secondary | ICD-10-CM

## 2020-01-09 MED ORDER — MIRABEGRON ER 50 MG PO TB24: ORAL_TABLET | ORAL | 0 refills | Status: DC

## 2020-01-15 ENCOUNTER — Other Ambulatory Visit: Payer: Self-pay | Admitting: Internal Medicine

## 2020-01-15 DIAGNOSIS — I951 Orthostatic hypotension: Secondary | ICD-10-CM

## 2020-01-15 MED ORDER — FLUDROCORTISONE ACETATE 0.1 MG PO TABS: ORAL_TABLET | ORAL | 1 refills | Status: DC

## 2020-01-23 ENCOUNTER — Other Ambulatory Visit: Payer: Self-pay | Admitting: Internal Medicine

## 2020-01-23 DIAGNOSIS — I951 Orthostatic hypotension: Secondary | ICD-10-CM

## 2020-01-23 MED ORDER — FLUDROCORTISONE ACETATE 0.1 MG PO TABS: ORAL_TABLET | ORAL | 1 refills | Status: DC

## 2020-01-28 ENCOUNTER — Ambulatory Visit: Payer: Medicare HMO | Admitting: Internal Medicine

## 2020-03-12 NOTE — Progress Notes (Addendum)
History of Present Illness:    This very nice 74 yo [name] with moderate Vascular Dementia who is also folleowed for Postural Hypotension  /  labile supine from Dysautonomia, HLD is monitored closely by wife for postural BP's.  Patient presents today for a 2 week hx/o a rapidly changing red scaly lesion of his upper mid chest for evaluation.  Caregiver wife also report patient gait was gotten worse since he stopped in-home LPT and desires that he be re-evaluated for High Risk Fall Gait.   Medications  .  fludrocortisone (FLORINEF) 0.1 MG tablet, Take 2 tablets 3 x/day  to maintain Standing BP over 100 .  midodrine (PROAMATINE) 10 MG tablet, Take 1 tablet 3 x /day for Low BP .  rosuvastatin (CRESTOR) 5 MG tablet, Take 1 tablet Daily for Cholesterol .  vitamin B-12 (CYANOCOBALAMIN) 500 MCG tablet, Dissolve 1 tablet under tongue daily .  Magnesium 250 MG TABS, Take 250 mg by mouth daily. .  mirabegron ER (MYRBETRIQ) 50 MG TB24 tablet, Take 1 tablet Daily for Bladder .  omeprazole (PRILOSEC) 20 MG capsule, Take 1 capsule 2 x /day for Acid Indigestion & Heart burn .  PAZEO 0.7 % SOLN,  .  potassium chloride SA (KLOR-CON) 20 MEQ tablet, Take 1 tablet 2 x /day for Potassium .  QUEtiapine (SEROQUEL) 50 MG tablet, Take 1 to 2 tablets in late Afternoon for "Sundowning" .  senna-docusate (GNP STOOL SOFTENER/LAXATIVE) 8.6-50 MG tablet, Take 1 tablet Daily for Constipation  Problem list He has HTN (hypertension); Hyperlipidemia; History of prediabetes; Vitamin D deficiency; Medication management; BMI 24.0-24.9, adult; Acid reflux; Memory changes; Imbalance; Unstable gait; and Frequent falls on their problem list.   Observations/Objective:   BP 126/82 Comment: sit-126/82 and stand-86/64  Pulse 80   Temp (!) 97.5 F (36.4 C)   Resp 16   Ht 6\' 2"  (1.88 m)   Wt 162 lb 3.2 oz (73.6 kg)   BMI 20.83 kg/m   HEENT - WNL. Neck - supple.  Chest - Clear equal BS. Cor - Nl HS. RRR w/o sig MGR. PP 1(+).  No edema. MS- FROM w/o deformities.  Gait Nl. Neuro -  Nl w/o focal abnormalities. Skin - There is a red slightly thickened and scaly area 20 mm x 25 mm irregular area over the upper mid chest.  Procedure [id number] ) After aseptic prep with alcohol and local anesthesia with 2 ml Marcaine 0.5% sq & intradermal, the area was sharply excised in a transverse elliptical fashion full thickness to the deep subcutaneous tissues. Then the wound edges were approximated with Nylon 3-0 with  # 4 vertical mattress sutures and then aligned & exerted with a running lock stitch with Nylon 3-0 of  #10 sutures. Transverse wound measured 5.0 cm. Path specimen was sent for analysis to r/o schirrous carcinoma. Wound was dressed with Antibiotic ung / sterile Guaze and covered with a 4' x 6" Tegaderm - Wife instructed in Post-op care.  Assessment and Plan:  1. Labile hypertension   2. Skin Cancer of Chest   - Surgical pathology  3. Unstable gait  - Ambulatory referral to [place] (LPT) 4. Severe muscle deconditioning  - Ambulatory referral to [place] (LPT)  5. Adult failure to thrive  - Ambulatory referral to [place] (LPT)  6. At high risk for falls  - Ambulatory referral to [place] (LPT)  7. Fatigue  - Ambulatory referral to [place] ( LPT)   Follow Up  Instructions:     I discussed the assessment and treatment plan with the patient's wife. The patient's wife  was provided an opportunity to ask questions and all were answered. The patient's wife  agreed with the plan and demonstrated an understanding of the instructions.      Patient to return approx 2 weeks for M/C  Wellness visit & sutures can be removed then   Kirtland Bouchard, MD

## 2020-03-13 ENCOUNTER — Encounter: Payer: Self-pay | Admitting: Internal Medicine

## 2020-03-13 ENCOUNTER — Other Ambulatory Visit: Payer: Self-pay

## 2020-03-13 ENCOUNTER — Other Ambulatory Visit: Payer: Self-pay | Admitting: Internal Medicine

## 2020-03-13 ENCOUNTER — Ambulatory Visit (INDEPENDENT_AMBULATORY_CARE_PROVIDER_SITE_OTHER): Payer: Medicare HMO | Admitting: Internal Medicine

## 2020-03-13 VITALS — BP 126/82 | HR 80 | Temp 97.5°F | Resp 16 | Ht 74.0 in | Wt 162.2 lb

## 2020-03-13 DIAGNOSIS — R5383 Other fatigue: Secondary | ICD-10-CM | POA: Diagnosis not present

## 2020-03-13 DIAGNOSIS — R0989 Other specified symptoms and signs involving the circulatory and respiratory systems: Secondary | ICD-10-CM | POA: Diagnosis not present

## 2020-03-13 DIAGNOSIS — R2681 Unsteadiness on feet: Secondary | ICD-10-CM

## 2020-03-13 DIAGNOSIS — D485 Neoplasm of uncertain behavior of skin: Secondary | ICD-10-CM | POA: Diagnosis not present

## 2020-03-13 DIAGNOSIS — R29898 Other symptoms and signs involving the musculoskeletal system: Secondary | ICD-10-CM | POA: Diagnosis not present

## 2020-03-13 DIAGNOSIS — C44509 Unspecified malignant neoplasm of skin of other part of trunk: Secondary | ICD-10-CM

## 2020-03-13 DIAGNOSIS — Z9181 History of falling: Secondary | ICD-10-CM

## 2020-03-13 DIAGNOSIS — C44529 Squamous cell carcinoma of skin of other part of trunk: Secondary | ICD-10-CM | POA: Diagnosis not present

## 2020-03-13 DIAGNOSIS — R627 Adult failure to thrive: Secondary | ICD-10-CM

## 2020-03-15 DIAGNOSIS — R627 Adult failure to thrive: Secondary | ICD-10-CM | POA: Diagnosis not present

## 2020-03-15 DIAGNOSIS — E785 Hyperlipidemia, unspecified: Secondary | ICD-10-CM | POA: Diagnosis not present

## 2020-03-15 DIAGNOSIS — F0150 Vascular dementia without behavioral disturbance: Secondary | ICD-10-CM | POA: Diagnosis not present

## 2020-03-15 DIAGNOSIS — E559 Vitamin D deficiency, unspecified: Secondary | ICD-10-CM | POA: Diagnosis not present

## 2020-03-15 DIAGNOSIS — I951 Orthostatic hypotension: Secondary | ICD-10-CM | POA: Diagnosis not present

## 2020-03-15 DIAGNOSIS — D485 Neoplasm of uncertain behavior of skin: Secondary | ICD-10-CM | POA: Diagnosis not present

## 2020-03-15 DIAGNOSIS — G901 Familial dysautonomia [Riley-Day]: Secondary | ICD-10-CM | POA: Diagnosis not present

## 2020-03-15 DIAGNOSIS — K219 Gastro-esophageal reflux disease without esophagitis: Secondary | ICD-10-CM | POA: Diagnosis not present

## 2020-03-15 DIAGNOSIS — I10 Essential (primary) hypertension: Secondary | ICD-10-CM | POA: Diagnosis not present

## 2020-03-19 DIAGNOSIS — I10 Essential (primary) hypertension: Secondary | ICD-10-CM | POA: Diagnosis not present

## 2020-03-19 DIAGNOSIS — R627 Adult failure to thrive: Secondary | ICD-10-CM | POA: Diagnosis not present

## 2020-03-19 DIAGNOSIS — D485 Neoplasm of uncertain behavior of skin: Secondary | ICD-10-CM | POA: Diagnosis not present

## 2020-03-19 DIAGNOSIS — G901 Familial dysautonomia [Riley-Day]: Secondary | ICD-10-CM | POA: Diagnosis not present

## 2020-03-19 DIAGNOSIS — F0150 Vascular dementia without behavioral disturbance: Secondary | ICD-10-CM | POA: Diagnosis not present

## 2020-03-19 DIAGNOSIS — E559 Vitamin D deficiency, unspecified: Secondary | ICD-10-CM | POA: Diagnosis not present

## 2020-03-19 DIAGNOSIS — I951 Orthostatic hypotension: Secondary | ICD-10-CM | POA: Diagnosis not present

## 2020-03-19 DIAGNOSIS — E785 Hyperlipidemia, unspecified: Secondary | ICD-10-CM | POA: Diagnosis not present

## 2020-03-19 DIAGNOSIS — K219 Gastro-esophageal reflux disease without esophagitis: Secondary | ICD-10-CM | POA: Diagnosis not present

## 2020-03-24 DIAGNOSIS — R627 Adult failure to thrive: Secondary | ICD-10-CM | POA: Diagnosis not present

## 2020-03-24 DIAGNOSIS — F0150 Vascular dementia without behavioral disturbance: Secondary | ICD-10-CM | POA: Diagnosis not present

## 2020-03-24 DIAGNOSIS — G901 Familial dysautonomia [Riley-Day]: Secondary | ICD-10-CM | POA: Diagnosis not present

## 2020-03-24 DIAGNOSIS — I951 Orthostatic hypotension: Secondary | ICD-10-CM | POA: Diagnosis not present

## 2020-03-24 DIAGNOSIS — E785 Hyperlipidemia, unspecified: Secondary | ICD-10-CM | POA: Diagnosis not present

## 2020-03-24 DIAGNOSIS — E559 Vitamin D deficiency, unspecified: Secondary | ICD-10-CM | POA: Diagnosis not present

## 2020-03-24 DIAGNOSIS — K219 Gastro-esophageal reflux disease without esophagitis: Secondary | ICD-10-CM | POA: Diagnosis not present

## 2020-03-24 DIAGNOSIS — D485 Neoplasm of uncertain behavior of skin: Secondary | ICD-10-CM | POA: Diagnosis not present

## 2020-03-24 DIAGNOSIS — I10 Essential (primary) hypertension: Secondary | ICD-10-CM | POA: Diagnosis not present

## 2020-03-26 ENCOUNTER — Ambulatory Visit: Payer: Medicare HMO | Admitting: Physician Assistant

## 2020-03-26 DIAGNOSIS — E538 Deficiency of other specified B group vitamins: Secondary | ICD-10-CM | POA: Insufficient documentation

## 2020-03-26 NOTE — Progress Notes (Signed)
MEDICARE ANNUAL WELLNESS VISIT AND FOLLOW UP Assessment:    Encounter for Medicare annual wellness exam 1 year  Essential hypertension - continue medications, DASH diet, exercise and monitor at home. Call if greater than 130/80.  -     CBC with Differential/Platelet -     COMPLETE METABOLIC PANEL WITH GFR -     TSH  Mixed hyperlipidemia check lipids decrease fatty foods increase activity.  -     Lipid panel  Medication management -     Magnesium  History of prediabetes Discussed disease progression and risks Discussed diet/exercise, weight management and risk modification  Vitamin D deficiency Continue supplement  Gastroesophageal reflux disease, esophagitis presence not specified Continue PPI/H2 blocker, diet discussed  BMI 24.0-24.9, adult -recommended diet heavy in fruits and veggies and low in animal meats, cheeses, and dairy products  Memory changes Imbalance Continue follow up neuro, continue B12 Control blood pressure, cholesterol, glucose, increase exercise.   B12 deficiency -     Vitamin B12 - 1000 3 x Mathew Miller week (takes 53m 2 pills)  Overactive bladder -     mirabegron ER (MYRBETRIQ) 50 MG TB24 tablet; Take 1 tablet (50 mg total) by mouth daily. - will stop the oxybutynin due to constipation, add magnesium    Wife requesting visit every 6 months  Future Appointments  Date Time Provider [place] 08/14/2020 10:00 AM MUnk Pinto MD [facility] None     Plan:   During the course of the visit the patient was educated and counseled about appropriate screening and preventive services including:    Pneumococcal vaccine   Influenza vaccine  Td vaccine  Screening electrocardiogram  Colorectal cancer screening  Diabetes screening  Glaucoma screening  Nutrition counseling   Subjective:  Mathew Miller Miller is Mathew Miller 74y.o. male who presents for Medicare Annual Wellness Visit and 3 month follow up for HTN, hyperlipidemia,  prediabetes, and vitamin D Def.   He is here in Mathew Miller wheel chair due to long disance, wife is with him and gives the majority of the history due to dementia. He has poor memory. He uses Mathew Miller walker at home, has poor balance and is doing PT at home. Wife does medications, she drives, she cooks.   He has incontinence, follows with Dr. DDiona Fantifor BPH with symptoms.   He had memory issues, had very low B12, on that now, he has seen neuro, Dr. JTomi Likensand had an MRI that showed chronic microvascular changes. Saw several years ago,. He is on seroqel for sleep/sun downing.  Getting PT at home [facility]ord Aase Sutures removed in the office, tolerated well. Has area on right cheek he wants looked at by Dr. MMelford Aase   Lab Results  Component Value Date   [id number] 03810/03/2019    His blood pressure has been controlled at home, off atenolol and BP meds, has history of dysautonomic postural hypotension & is requiring maximal doses of Florinef  & Midodrine to maintain Mathew Miller standing BP so he can function., today their BP is BP: 136/72 He does workout. He denies chest pain, shortness of breath, dizziness.  He is on cholesterol medication and denies myalgias. His cholesterol is at goal. The cholesterol last visit was:   Lab Results  Component Value Date   CHOL 134 12/20/2019   HDL 54 12/20/2019   LDLCALC 67 12/20/2019   TRIG 52 12/20/2019   CHOLHDL 2.5 12/20/2019   He has  been working on diet and exercise for prediabetes, and denies paresthesia of the feet, polydipsia, polyuria and visual disturbances. Last A1C in the office was:  Lab Results  Component Value Date   HGBA1C 5.2 12/20/2019   Patient is on Vitamin D supplement.   Lab Results  Component Value Date   VD25OH 76 12/20/2019     BMI is Body mass index is 20.93 kg/m., he is working on diet and exercise. Wt Readings from Last 3 Encounters:  03/27/20 163 lb (73.9 kg)  03/13/20 162 lb 3.2  oz (73.6 kg)  12/20/19 169 lb 6.4 oz (76.8 kg)    Names of Other Physician/Practitioners you currently use: 1. [facility] here for primary care 2. [place] [facility], last visit 2020 Dr. Nicki Reaper 2 weeks ago dentist 3. Dr. Deatra Ina, GI Patient Care Team: Unk Pinto, MD as PCP - General (Internal Medicine) Inda Castle, MD (Inactive) as Consulting Physician (Gastroenterology) Carolan Clines, MD (Inactive) as Consulting Physician (Urology) Webb Laws, [place] as Referring Physician (Optometry)  Medication Review:  Current Outpatient Medications (Endocrine & Metabolic):  .  fludrocortisone (FLORINEF) 0.1 MG tablet, Take 2 tablets 3 x/day  to maintain Standing BP over 100  Current Outpatient Medications (Cardiovascular):  .  midodrine (PROAMATINE) 10 MG tablet, Take 1 tablet 3 x /day for Low BP .  rosuvastatin (CRESTOR) 5 MG tablet, Take 1 tablet Daily for Cholesterol    Current Outpatient Medications (Hematological):  .  vitamin B-12 (CYANOCOBALAMIN) 500 MCG tablet, Dissolve 1 tablet under tongue daily  Current Outpatient Medications (Other):  Marland Kitchen  Accu-Chek Softclix Lancets lancets, Check blood sugar 1 time Mathew Miller day.[id number]. Marland Kitchen  Alcohol Swabs (B-D SINGLE USE SWABS REGULAR) PADS, Check blood sugar 1 time daily-[id number]. Marland Kitchen  Blood Glucose Monitoring Suppl (ACCU-CHEK AVIVA PLUS) w/Device KIT, USE AS DIRECTED .  glucose blood (ACCU-CHEK AVIVA PLUS) test strip, Check blood sugar 1 time Mathew Miller day-DX-Z87.898 .  Magnesium 250 MG TABS, Take 250 mg by mouth daily. .  mirabegron ER (MYRBETRIQ) 50 MG TB24 tablet, Take 1 tablet Daily for Bladder .  omeprazole (PRILOSEC) 20 MG capsule, Take 1 capsule 2 x /day for Acid Indigestion & Heart burn .  PAZEO 0.7 % SOLN,  .  potassium chloride SA (KLOR-CON) 20 MEQ tablet, Take 1 tablet 2 x /day for Potassium .  QUEtiapine (SEROQUEL) 50 MG tablet, Take 1 to 2 tablets in late Afternoon for "Sundowning" .   senna-docusate (GNP STOOL SOFTENER/LAXATIVE) 8.6-50 MG tablet, Take 1 tablet Daily for Constipation  Current Problems (verified) Patient Active Problem List   Diagnosis Date Noted  . B12 deficiency 03/26/2020  . Unstable gait 10/01/2019  . Frequent falls 10/01/2019  . Imbalance 02/27/2018  . BMI 24.0-24.9, adult 02/26/2018  . Acid reflux 02/26/2018  . Memory changes 02/26/2018  . Vitamin D deficiency 03/01/2014  . Medication management 03/01/2014  . Hyperlipidemia   . History of prediabetes   . HTN (hypertension) 10/09/2013    Screening Tests Immunization History  Administered Date(s) Administered  . Influenza Split 08/06/2013  . Influenza, High Dose Seasonal PF 07/30/2014, 07/29/2015, 08/11/2016, 07/20/2017, 07/18/2018  . Influenza-Unspecified 07/18/2018, 06/24/2019  . PFIZER SARS-COV-2 Vaccination 11/04/2019, 11/25/2019  . Pneumococcal Conjugate-13 04/23/2016  . Pneumococcal Polysaccharide-23 10/21/2009  . Td 10/18/1965  . Zoster 10/19/2011  . Zoster Recombinat (Shingrix) 03/17/2018, 06/08/2018   Health Maintenance  Topic Date Due  . Hepatitis C Screening  08/31/2023 (Originally 1946/06/07)  . TETANUS/TDAP  08/26/2027 (Originally 10/19/1977)  .  INFLUENZA VACCINE  05/18/2020  . COVID-19 Vaccine  Completed  . PNA vac Low Risk Adult  Completed    Preventative care: Last colonoscopy: 09/04/2014 Dr. Deatra Ina will not get another MRI brain 01/19/2018  Prior vaccinations: TD or Tdap: allergy  Influenza: 2018 Pneumococcal: 2011 Prevnar 2017 Shingles/Zostavax: 2013  History reviewed: allergies, current medications, past family history, past medical history, past social history, past surgical history and problem list  Allergies Allergies  Allergen Reactions  . Hydrocodone-Acetaminophen Nausea Only  . Sudafed [Pseudoephedrine Hcl]     Urinary retention  . Tetanus Toxoids     Itching, burning feeling, ran Mathew Miller fever    SURGICAL HISTORY He  has Mathew Miller past surgical history  that includes No past surgeries; Colonoscopy; and Cataract extraction, bilateral (2017). FAMILY HISTORY His family history includes Glaucoma in his mother; Heart disease in his father; Hyperlipidemia in his mother; Hypertension in his father. SOCIAL HISTORY He  reports that he has never smoked. He has never used smokeless tobacco. He reports current alcohol use. He reports that he does not use drugs.  MEDICARE WELLNESS OBJECTIVES: Physical activity:   None Depression/mood screen:   Depression screen [facility] 2/9 03/27/2020  Decreased Interest 0  Down, Depressed, Hopeless 0  PHQ - 2 Score 0    ADLs:  In your present state of health, do you have any difficulty performing the following activities: 03/27/2020 12/22/2019  Hearing? N N  Comment wears hearing aids -  Vision? N N  Comment wears glasses -  Difficulty concentrating or making decisions? Tempie Donning  Walking or climbing stairs? Y N  Dressing or bathing? Y N  Comment some problem with buttons, has walk in shower -  Doing errands, shopping? Tempie Donning  Comment wife driving wife drives  Conservation officer, nature and eating ? Y -  Using the Toilet? N -  In the past six months, have you accidently leaked urine? N -  Do you have problems with loss of bowel control? N -  Managing your Medications? Y -  Managing your Finances? Y -  Housekeeping or managing your Housekeeping? Y -  Some recent data might be hidden     Cognitive Testing  Alert? Yes  Normal Appearance?Yes  Oriented to person? Yes  Place? Yes   Time? No  Recall of three objects?  No  EOL planning:    YES  Objective:   Blood pressure 136/72, pulse 80, temperature 97.6 F (36.4 C), weight 163 lb (73.9 kg), SpO2 98 %. Body mass index is 20.93 kg/m.  General appearance: alert, no distress, WD/WN, male HEENT: normocephalic, sclerae anicteric, TMs pearly, nares patent, no discharge or erythema, pharynx normal Oral cavity: MMM, no lesions Neck: supple, no lymphadenopathy, no thyromegaly, no  masses Heart: RRR, normal S1, S2, no murmurs Lungs: CTA bilaterally, no wheezes, rhonchi, or rales Abdomen: +bs, soft, non tender, non distended, no masses, no hepatomegaly, no splenomegaly Musculoskeletal: nontender, no swelling, no obvious deformity Extremities: no edema, no cyanosis, no clubbing Pulses: 2+ symmetric, upper and lower extremities, normal cap refill Skin: suture removed from right upper chest, no swelling,warmth, discharge, well appoximated.  Neurological: alert, oriented x 3, CN2-12 intact, strength normal upper extremities and lower extremities, sensation normal throughout, DTRs 2+ throughout, no cerebellar signs, no tremor, gait not assessed, patient in wheel chair Psychiatric: flat affect, behavior normal, pleasant     Medicare Attestation I have personally reviewed: The patient's medical and social history Their use of alcohol, tobacco or illicit drugs Their current  medications and supplements The patient's functional ability including ADLs,fall risks, home safety risks, cognitive, and hearing and visual impairment Diet and physical activities Evidence for depression or mood disorders  The patient's weight, height, BMI, and visual acuity have been recorded in the chart.  I have made referrals, counseling, and provided education to the patient based on review of the above and I have provided the patient with Mathew Miller written personalized care plan for preventive services.     Vicie Mutters, PA-C   03/27/2020

## 2020-03-27 ENCOUNTER — Other Ambulatory Visit: Payer: Self-pay

## 2020-03-27 ENCOUNTER — Encounter: Payer: Self-pay | Admitting: Physician Assistant

## 2020-03-27 ENCOUNTER — Ambulatory Visit (INDEPENDENT_AMBULATORY_CARE_PROVIDER_SITE_OTHER): Payer: Medicare HMO | Admitting: Physician Assistant

## 2020-03-27 VITALS — BP 136/72 | HR 80 | Temp 97.6°F | Wt 163.0 lb

## 2020-03-27 DIAGNOSIS — E782 Mixed hyperlipidemia: Secondary | ICD-10-CM | POA: Diagnosis not present

## 2020-03-27 DIAGNOSIS — R6889 Other general symptoms and signs: Secondary | ICD-10-CM | POA: Diagnosis not present

## 2020-03-27 DIAGNOSIS — E538 Deficiency of other specified B group vitamins: Secondary | ICD-10-CM

## 2020-03-27 DIAGNOSIS — Z6824 Body mass index (BMI) 24.0-24.9, adult: Secondary | ICD-10-CM

## 2020-03-27 DIAGNOSIS — R296 Repeated falls: Secondary | ICD-10-CM | POA: Diagnosis not present

## 2020-03-27 DIAGNOSIS — Z87898 Personal history of other specified conditions: Secondary | ICD-10-CM

## 2020-03-27 DIAGNOSIS — E559 Vitamin D deficiency, unspecified: Secondary | ICD-10-CM

## 2020-03-27 DIAGNOSIS — Z79899 Other long term (current) drug therapy: Secondary | ICD-10-CM

## 2020-03-27 DIAGNOSIS — R2689 Other abnormalities of gait and mobility: Secondary | ICD-10-CM

## 2020-03-27 DIAGNOSIS — I10 Essential (primary) hypertension: Secondary | ICD-10-CM

## 2020-03-27 DIAGNOSIS — Z0000 Encounter for general adult medical examination without abnormal findings: Secondary | ICD-10-CM

## 2020-03-27 DIAGNOSIS — Z0001 Encounter for general adult medical examination with abnormal findings: Secondary | ICD-10-CM | POA: Diagnosis not present

## 2020-03-27 DIAGNOSIS — R2681 Unsteadiness on feet: Secondary | ICD-10-CM

## 2020-03-27 DIAGNOSIS — K219 Gastro-esophageal reflux disease without esophagitis: Secondary | ICD-10-CM | POA: Diagnosis not present

## 2020-03-27 DIAGNOSIS — R413 Other amnesia: Secondary | ICD-10-CM | POA: Diagnosis not present

## 2020-03-27 NOTE — Patient Instructions (Signed)
Constipation, Adult Constipation is when a person has fewer bowel movements in a week than normal, has difficulty having a bowel movement, or has stools that are dry, hard, or larger than normal. Constipation may be caused by an underlying condition. It may become worse with age if a person takes certain medicines and does not take in enough fluids. Follow these instructions at home: Eating and drinking   Eat foods that have a lot of fiber, such as fresh fruits and vegetables, whole grains, and beans.  Limit foods that are high in fat, low in fiber, or overly processed, such as french fries, hamburgers, cookies, candies, and soda.  Drink enough fluid to keep your urine clear or pale yellow. General instructions  Exercise regularly or as told by your health care provider.  Go to the restroom when you have the urge to go. Do not hold it in.  Take over-the-counter and prescription medicines only as told by your health care provider. These include any fiber supplements.  Practice pelvic floor retraining exercises, such as deep breathing while relaxing the lower abdomen and pelvic floor relaxation during bowel movements.  Watch your condition for any changes.  Keep all follow-up visits as told by your health care provider. This is important. Contact a health care provider if:  You have pain that gets worse.  You have a fever.  You do not have a bowel movement after 4 days.  You vomit.  You are not hungry.  You lose weight.  You are bleeding from the anus.  You have thin, pencil-like stools. Get help right away if:  You have a fever and your symptoms suddenly get worse.  You leak stool or have blood in your stool.  Your abdomen is bloated.  You have severe pain in your abdomen.  You feel dizzy or you faint. This information is not intended to replace advice given to you by your health care provider. Make sure you discuss any questions you have with your health care  provider. Document Revised: 09/16/2017 Document Reviewed: 03/24/2016 Elsevier Patient Education  2020 [place].   Squamous Cell Carcinoma Squamous cell carcinoma is a common form of skin cancer. It begins in the squamous cells in the outer layer of the skin (epidermis). It occurs most often in parts of the body that are frequently exposed to the sun, such as the face, ears, lips, neck, arms, legs, and hands. However, this condition can occur anywhere on the body, including the inside of the mouth, the genital area, and the anus. If squamous cell carcinoma is treated early, it rarely spreads to other areas of the body (metastasizes). If it is not treated, it can affect nearby tissues. In rare cases, it can spread to other areas of the body. What are the causes? This condition is usually caused by exposure to ultraviolet (UV) light. UV light may come from the sun or from tanning beds.  Other causes include exposure to:  Arsenic.  Radiation.  Toxic tars and oils. In very rare cases, a genetic condition that makes a person sensitive to sunlight (xeroderma pigmentosum) may cause the condition. What increases the risk? This condition is more likely to develop in people who:  Are older than 76 years of age.  Have fair skin (light complexion).  Have blond or red hair.  Have blue, green, or gray eyes.  Have childhood freckling.  Have had sun exposure over long periods of time, especially during childhood.  Use tanning beds.  Have  had psoralen and ultraviolet A (PUVA) treatments.  Have had repeated sunburns.  Have a weakened immune system.  Have an HPV (human papillomavirus) infection.  Have a history of precancerous lesions (actinic keratosis).  Have conditions that cause chronic scarring. These can include burn scars, chronic ulcers, heat (thermal) injuries, and radiation.  Have been exposed to certain chemicals, such as tar, soot, and arsenic.  Smoke. What are the  signs or symptoms?  This condition often starts as a red, pink, or brown growth on the skin. The growths have an irregular surface that may feel rough. In some cases, the growths are easier to feel than to see. The growths may develop into a sore that does not heal. How is this diagnosed? This condition may be diagnosed with:  A physical exam.  Removal of a tissue sample to be examined under a microscope (biopsy). How is this treated? Treatment for this condition involves removing the cancerous tissue. The method that is used for this depends on the size and location of the tumor, as well as your overall health. Possible treatments include:  Electrodesiccation and curettage. This involves alternately scraping and burning the tumor while using an electric current to control bleeding.  Cryosurgery. This involves freezing the tumor with liquid nitrogen.  Laser therapy. This uses an intense beam of light to remove the tumor.  Photodynamic therapy. A chemical cream is applied to the skin, and light exposure is used to activate the chemical.  High-energy rays that kill cancer cells (radiation therapy). This may be used for tumors that are deeper in the tissues.  Surgical removal (excision) of the tumor. This involves removing the entire tumor and a small amount of normal skin that surrounds it.  Mohs surgery. In this procedure, the cancerous skin cells are removed layer by layer until all of the tumor has been removed.  Plastic surgery. The tumor is removed, and healthy skin from another part of the body is used to cover the wound (skin graft).  Chemotherapy. This treatment uses medicines to destroy cancer cells.  Chemotherapy creams or lotions. These may be applied directly to the skin where the tumor is located and may be used for smaller tumors.  Targeted therapy. This targets specific parts of cancer cells and the area around them to block the growth and spread of the  cancer.  Immunotherapy. This treatment helps your body's immune system fight the cancer cells. Follow these instructions at home:   Avoid being out in the sun. Wear protective clothing, including long-sleeved shirts, long pants, and a hat when outdoors.  Do skin self-exams as told by your health care provider. Look for any new spots or changes in your skin.  Do not use any products that contain nicotine or tobacco, such as cigarettes, e-cigarettes, and chewing tobacco. If you need help quitting, ask your health care provider.  Keep all follow-up visits as told by your health care provider. This is important. How is this prevented?   Avoid the sun when it is at its strongest. This is usually between 10:00 a.m. and 4:00 p.m.  When you are out in the sun, use a sunscreen that has a sun protection factor (SPF) of at least 77.  Apply sunscreen at least 30 minutes before exposure to the sun.  Reapply sunscreen every 2-4 hours while you are outside. Also reapply it after swimming and after excessive sweating.  Always wear hats, protective clothing, and UV-blocking sunglasses when you are outdoors.  Do not use  tanning beds. Contact a health care provider if:  You notice any new growths or any changes in your skin.  You have had a squamous cell carcinoma tumor removed and you notice a new growth in the same location. Get help right away if you have:  A fever or chills.  Chest pain or difficulty breathing. Summary  Squamous cell carcinoma is a common form of skin cancer. It begins in the squamous cells in the outer layer of the skin (epidermis).  This condition is usually caused by exposure to ultraviolet (UV) light.  Treatment for this condition involves removing the cancerous tissue. The method that is used for this depends on the size and location of the tumor, as well as your overall health.  Contact a health care provider if you notice any new growths or any changes in your  skin.  Keep all follow-up visits as told by your health care provider. This is important. This information is not intended to replace advice given to you by your health care provider. Make sure you discuss any questions you have with your health care provider. Document Revised: 06/27/2018 Document Reviewed: 06/27/2018 Elsevier Patient Education  [place].

## 2020-03-28 DIAGNOSIS — K219 Gastro-esophageal reflux disease without esophagitis: Secondary | ICD-10-CM | POA: Diagnosis not present

## 2020-03-28 DIAGNOSIS — R627 Adult failure to thrive: Secondary | ICD-10-CM | POA: Diagnosis not present

## 2020-03-28 DIAGNOSIS — E785 Hyperlipidemia, unspecified: Secondary | ICD-10-CM | POA: Diagnosis not present

## 2020-03-28 DIAGNOSIS — I10 Essential (primary) hypertension: Secondary | ICD-10-CM | POA: Diagnosis not present

## 2020-03-28 DIAGNOSIS — F0150 Vascular dementia without behavioral disturbance: Secondary | ICD-10-CM | POA: Diagnosis not present

## 2020-03-28 DIAGNOSIS — D485 Neoplasm of uncertain behavior of skin: Secondary | ICD-10-CM | POA: Diagnosis not present

## 2020-03-28 DIAGNOSIS — E559 Vitamin D deficiency, unspecified: Secondary | ICD-10-CM | POA: Diagnosis not present

## 2020-03-28 DIAGNOSIS — G901 Familial dysautonomia [Riley-Day]: Secondary | ICD-10-CM | POA: Diagnosis not present

## 2020-03-28 DIAGNOSIS — I951 Orthostatic hypotension: Secondary | ICD-10-CM | POA: Diagnosis not present

## 2020-03-28 LAB — CBC WITH DIFFERENTIAL/PLATELET
Absolute Monocytes: 610 cells/uL (ref 200–950)
Basophils Absolute: 75 cells/uL (ref 0–200)
Basophils Relative: 0.7 %
Eosinophils Absolute: 64 cells/uL (ref 15–500)
Eosinophils Relative: 0.6 %
HCT: 42.3 % (ref 38.5–50.0)
Hemoglobin: 13.9 g/dL (ref 13.2–17.1)
Lymphs Abs: 1177 cells/uL (ref 850–3900)
MCH: 30 pg (ref 27.0–33.0)
MCHC: 32.9 g/dL (ref 32.0–36.0)
MCV: 91.4 fL (ref 80.0–100.0)
MPV: 10.8 fL (ref 7.5–12.5)
Monocytes Relative: 5.7 %
Neutro Abs: 8774 cells/uL — ABNORMAL HIGH (ref 1500–7800)
Neutrophils Relative %: 82 %
Platelets: 178 10*3/uL (ref 140–400)
RBC: 4.63 10*6/uL (ref 4.20–5.80)
RDW: 13.6 % (ref 11.0–15.0)
Total Lymphocyte: 11 %
WBC: 10.7 10*3/uL (ref 3.8–10.8)

## 2020-03-28 LAB — COMPLETE METABOLIC PANEL WITH GFR
AG Ratio: 2 (calc) (ref 1.0–2.5)
ALT: 14 U/L (ref 9–46)
AST: 12 U/L (ref 10–35)
Albumin: 4.1 g/dL (ref 3.6–5.1)
Alkaline phosphatase (APISO): 79 U/L (ref 35–144)
BUN/Creatinine Ratio: 15 (calc) (ref 6–22)
BUN: 26 mg/dL — ABNORMAL HIGH (ref 7–25)
CO2: 33 mmol/L — ABNORMAL HIGH (ref 20–32)
Calcium: 10 mg/dL (ref 8.6–10.3)
Chloride: 106 mmol/L (ref 98–110)
Creat: 1.69 mg/dL — ABNORMAL HIGH (ref 0.70–1.18)
GFR, Est African American: 45 mL/min/{1.73_m2} — ABNORMAL LOW (ref >=60)
GFR, Est Non African American: 39 mL/min/{1.73_m2} — ABNORMAL LOW (ref >=60)
Globulin: 2.1 g/dL (calc) (ref 1.9–3.7)
Glucose, Bld: 119 mg/dL — ABNORMAL HIGH (ref 65–99)
Potassium: 3.7 mmol/L (ref 3.5–5.3)
Sodium: 146 mmol/L (ref 135–146)
Total Bilirubin: 0.7 mg/dL (ref 0.2–1.2)
Total Protein: 6.2 g/dL (ref 6.1–8.1)

## 2020-03-28 LAB — LIPID PANEL
Cholesterol: 144 mg/dL (ref <200)
HDL: 63 mg/dL (ref >=40)
LDL Cholesterol (Calc): 65 mg/dL (calc)
Non-HDL Cholesterol (Calc): 81 mg/dL (calc) (ref <130)
Total CHOL/HDL Ratio: 2.3 (calc) (ref <5.0)
Triglycerides: 77 mg/dL (ref <150)

## 2020-03-28 LAB — MAGNESIUM: Magnesium: 2.7 mg/dL — ABNORMAL HIGH (ref 1.5–2.5)

## 2020-03-28 LAB — HEMOGLOBIN A1C
Hgb A1c MFr Bld: 5 % of total Hgb (ref <5.7)
Mean Plasma Glucose: 97 (calc)
eAG (mmol/L): 5.4 (calc)

## 2020-03-28 LAB — TSH: TSH: 2.17 mIU/L (ref 0.40–4.50)

## 2020-03-31 ENCOUNTER — Ambulatory Visit: Payer: Medicare HMO | Admitting: Physician Assistant

## 2020-03-31 DIAGNOSIS — I951 Orthostatic hypotension: Secondary | ICD-10-CM | POA: Diagnosis not present

## 2020-03-31 DIAGNOSIS — E785 Hyperlipidemia, unspecified: Secondary | ICD-10-CM | POA: Diagnosis not present

## 2020-03-31 DIAGNOSIS — F0150 Vascular dementia without behavioral disturbance: Secondary | ICD-10-CM | POA: Diagnosis not present

## 2020-03-31 DIAGNOSIS — R627 Adult failure to thrive: Secondary | ICD-10-CM | POA: Diagnosis not present

## 2020-03-31 DIAGNOSIS — K219 Gastro-esophageal reflux disease without esophagitis: Secondary | ICD-10-CM | POA: Diagnosis not present

## 2020-03-31 DIAGNOSIS — G901 Familial dysautonomia [Riley-Day]: Secondary | ICD-10-CM | POA: Diagnosis not present

## 2020-03-31 DIAGNOSIS — D485 Neoplasm of uncertain behavior of skin: Secondary | ICD-10-CM | POA: Diagnosis not present

## 2020-03-31 DIAGNOSIS — E559 Vitamin D deficiency, unspecified: Secondary | ICD-10-CM | POA: Diagnosis not present

## 2020-03-31 DIAGNOSIS — I10 Essential (primary) hypertension: Secondary | ICD-10-CM | POA: Diagnosis not present

## 2020-04-02 DIAGNOSIS — G901 Familial dysautonomia [Riley-Day]: Secondary | ICD-10-CM | POA: Diagnosis not present

## 2020-04-02 DIAGNOSIS — K219 Gastro-esophageal reflux disease without esophagitis: Secondary | ICD-10-CM | POA: Diagnosis not present

## 2020-04-02 DIAGNOSIS — D485 Neoplasm of uncertain behavior of skin: Secondary | ICD-10-CM | POA: Diagnosis not present

## 2020-04-02 DIAGNOSIS — I951 Orthostatic hypotension: Secondary | ICD-10-CM | POA: Diagnosis not present

## 2020-04-02 DIAGNOSIS — F0150 Vascular dementia without behavioral disturbance: Secondary | ICD-10-CM | POA: Diagnosis not present

## 2020-04-02 DIAGNOSIS — E785 Hyperlipidemia, unspecified: Secondary | ICD-10-CM | POA: Diagnosis not present

## 2020-04-02 DIAGNOSIS — R627 Adult failure to thrive: Secondary | ICD-10-CM | POA: Diagnosis not present

## 2020-04-02 DIAGNOSIS — I10 Essential (primary) hypertension: Secondary | ICD-10-CM | POA: Diagnosis not present

## 2020-04-02 DIAGNOSIS — E559 Vitamin D deficiency, unspecified: Secondary | ICD-10-CM | POA: Diagnosis not present

## 2020-04-09 DIAGNOSIS — H524 Presbyopia: Secondary | ICD-10-CM | POA: Diagnosis not present

## 2020-04-09 DIAGNOSIS — Z961 Presence of intraocular lens: Secondary | ICD-10-CM | POA: Diagnosis not present

## 2020-04-11 DIAGNOSIS — I951 Orthostatic hypotension: Secondary | ICD-10-CM | POA: Diagnosis not present

## 2020-04-11 DIAGNOSIS — E559 Vitamin D deficiency, unspecified: Secondary | ICD-10-CM | POA: Diagnosis not present

## 2020-04-11 DIAGNOSIS — F0150 Vascular dementia without behavioral disturbance: Secondary | ICD-10-CM | POA: Diagnosis not present

## 2020-04-11 DIAGNOSIS — K219 Gastro-esophageal reflux disease without esophagitis: Secondary | ICD-10-CM | POA: Diagnosis not present

## 2020-04-11 DIAGNOSIS — D485 Neoplasm of uncertain behavior of skin: Secondary | ICD-10-CM | POA: Diagnosis not present

## 2020-04-11 DIAGNOSIS — E785 Hyperlipidemia, unspecified: Secondary | ICD-10-CM | POA: Diagnosis not present

## 2020-04-11 DIAGNOSIS — R627 Adult failure to thrive: Secondary | ICD-10-CM | POA: Diagnosis not present

## 2020-04-11 DIAGNOSIS — G901 Familial dysautonomia [Riley-Day]: Secondary | ICD-10-CM | POA: Diagnosis not present

## 2020-04-11 DIAGNOSIS — I10 Essential (primary) hypertension: Secondary | ICD-10-CM | POA: Diagnosis not present

## 2020-04-16 DIAGNOSIS — G901 Familial dysautonomia [Riley-Day]: Secondary | ICD-10-CM | POA: Diagnosis not present

## 2020-04-16 DIAGNOSIS — I10 Essential (primary) hypertension: Secondary | ICD-10-CM | POA: Diagnosis not present

## 2020-04-16 DIAGNOSIS — K219 Gastro-esophageal reflux disease without esophagitis: Secondary | ICD-10-CM | POA: Diagnosis not present

## 2020-04-16 DIAGNOSIS — E785 Hyperlipidemia, unspecified: Secondary | ICD-10-CM | POA: Diagnosis not present

## 2020-04-16 DIAGNOSIS — R627 Adult failure to thrive: Secondary | ICD-10-CM | POA: Diagnosis not present

## 2020-04-16 DIAGNOSIS — D485 Neoplasm of uncertain behavior of skin: Secondary | ICD-10-CM | POA: Diagnosis not present

## 2020-04-16 DIAGNOSIS — F0150 Vascular dementia without behavioral disturbance: Secondary | ICD-10-CM | POA: Diagnosis not present

## 2020-04-16 DIAGNOSIS — E559 Vitamin D deficiency, unspecified: Secondary | ICD-10-CM | POA: Diagnosis not present

## 2020-04-16 DIAGNOSIS — I951 Orthostatic hypotension: Secondary | ICD-10-CM | POA: Diagnosis not present

## 2020-04-17 ENCOUNTER — Telehealth: Payer: Self-pay

## 2020-04-17 NOTE — Telephone Encounter (Signed)
Patient's wife called questioning about BP medication and what to do about giving it to him this evening. Per Dr. Melford Aase, patient is not to take BP meds if over 120.

## 2020-04-22 NOTE — Progress Notes (Signed)
     History of Present Illness:      Patient is a very nice 74 yo [name] with  moderate Vascular Dementia and  Postural Hypotension / labile supine HT from Dysautonomia who presents today with wife's concerns of a non healing lesion of his Rt cheek and 2 excoriated areas of his upper back. Widely divergent BP's are reviewed with wife. She monitors postural BP's at least 2 x /day and withholds his Florinef & Proamatine when his BP's are moderately to severely elevated.   Medications  .  fludrocortisone (FLORINEF) 0.1 MG t, Take 2 tablets 3 x/day  to maintain Standing BP over 100 .  midodrine (PROAMATINE) 10 MG tablet, Take 1 tablet 3 x /day for Low BP .  rosuvastatin (CRESTOR) 5 MG tablet, Take 1 tablet Daily for Cholesterol .  vitamin B-12 (CYANOCOBALAMIN) 500 MCG tablet, Dissolve 1 tablet under tongue daily .  Magnesium 250 MG TABS, Take 250 mg by mouth daily. .  mirabegron ER (MYRBETRIQ) 50 MG TB24 tablet, Take 1 tablet Daily for Bladder .  omeprazole  20 MG capsule, Take 1 capsule 2 x /day for Acid Indigestion & Heart burn .  PAZEO 0.7 % SOLN,  .  potassium chloride SA (KLOR-CON) 20 MEQ tablet, Take 1 tablet 2 x /day for Potassium .  QUEtiapine (SEROQUEL) 50 MG tablet, Take 1 to 2 tablets in late Afternoon for "Sundowning" .  senna-docusate  8.6-50 MG tablet, Take 1 tablet Daily for Constipation  Problem list He has HTN (hypertension); Hyperlipidemia; History of prediabetes; Vitamin D deficiency; Medication management; BMI 24.0-24.9, adult; Acid reflux; Memory changes; Imbalance; Unstable gait; Frequent falls; and B12 deficiency on their problem list.   Observations/Objective:  BP 112/66   Pulse 80   Temp 97.8 F (36.6 C)   Resp 16   HEENT - WNL. Neck - supple.  Chest - Clear equal BS. Cor - Nl HS. RRR w/o sig MGR. PP 1(+). No edema. MS- FROM w/o deformities.  Gait Nl. Neuro -  Nl w/o focal abnormalities. Skin - 5-6  Mm x 10-12 mm superficially ulcerated pink lesion of the Rt  cheek over the zygoma area and #  3 excoriate inflamed raised medium brown seborrheic keratoses approx 2 x 3 cm each of the upper left and mid upper back.   Procedure (CPT 17000 and 41937 x20     After informed consent with patient's wife Bethena Roys) all 3 of the lasions are treated with liquid Nitrogen by Triple Freeze / Thaw technique and then covered with bterile bandages. Patient's wife instructed in post-op wound care.   Assessment and Plan:  1. Autonomic postural hypotension   2. Labile hypertension   3. Neoplasm of uncertain behavior of skin  - Cryosurgery  4. Seborrheic keratoses, inflamed  - Cryosurgery        I discussed the assessment and treatment plan with the patient. The patient was provided an opportunity to ask questions and all were answered. The patient agreed with the plan and demonstrated an understanding of the instructions.        The patient was advised to call back or seek an in-person evaluation if the symptoms worsen or if the condition fails to improve as anticipated.   Kirtland Bouchard, MD

## 2020-04-23 ENCOUNTER — Other Ambulatory Visit: Payer: Self-pay

## 2020-04-23 ENCOUNTER — Encounter: Payer: Self-pay | Admitting: Internal Medicine

## 2020-04-23 ENCOUNTER — Ambulatory Visit (INDEPENDENT_AMBULATORY_CARE_PROVIDER_SITE_OTHER): Payer: Medicare HMO | Admitting: Internal Medicine

## 2020-04-23 VITALS — BP 112/66 | HR 80 | Temp 97.8°F | Resp 16

## 2020-04-23 DIAGNOSIS — D485 Neoplasm of uncertain behavior of skin: Secondary | ICD-10-CM

## 2020-04-23 DIAGNOSIS — L820 Inflamed seborrheic keratosis: Secondary | ICD-10-CM

## 2020-04-23 DIAGNOSIS — I951 Orthostatic hypotension: Secondary | ICD-10-CM

## 2020-04-23 DIAGNOSIS — R0989 Other specified symptoms and signs involving the circulatory and respiratory systems: Secondary | ICD-10-CM

## 2020-04-23 NOTE — Patient Instructions (Signed)
Cryosurgery for Skin Conditions, Care After These instructions give you information on caring for yourself after your procedure. Your doctor may also give you more specific instructions. Call your doctor if you have any problems or questions after your procedure. Follow these instructions at home: Caring for the treated area   Follow instructions from your doctor about how to take care of the treated area. Make sure you: ? Keep the area covered with a bandage (dressing) until it heals, or for as long as told by your doctor. ? Wash your hands with soap and water before you change your bandage. If you do not have soap and water, use hand sanitizer. ? Change your bandage as told by your doctor. ? Keep the bandage and the treated area clean and dry. If the bandage gets wet, change it right away. ? Clean the treated area with soap and water.  Check the treated area every day for signs of infection. Check for: ? More redness, swelling, or pain. ? More fluid or blood. ? Warmth. ? Pus or a bad smell. General instructions  Do not pick at your blister. Do not try to break it open. This can cause infection and scarring.  Do not put any medicine, cream, or lotion on the treated area unless told by your doctor.  Take over-the-counter and prescription medicines only as told by your doctor.  Keep all follow-up visits as told by your doctor. This is important. Contact a doctor if:  You have more redness, swelling, or pain around the treated area.  You have more fluid or blood coming from the treated area.  The treated area feels warm to the touch.  You have pus or a bad smell coming from the treated area.  Your blister gets large and painful. Get help right away if:  You have a fever and have redness spreading from the treated area. Summary  You should keep the treated area and your bandage clean and dry.  Check the treated area every day for signs of infection. Signs include fluid, pus,  warmth, or having more redness, swelling, or pain.  Do not pick at your blister. Do not try to break it open.

## 2020-04-25 DIAGNOSIS — R627 Adult failure to thrive: Secondary | ICD-10-CM | POA: Diagnosis not present

## 2020-04-25 DIAGNOSIS — G901 Familial dysautonomia [Riley-Day]: Secondary | ICD-10-CM | POA: Diagnosis not present

## 2020-04-25 DIAGNOSIS — E559 Vitamin D deficiency, unspecified: Secondary | ICD-10-CM | POA: Diagnosis not present

## 2020-04-25 DIAGNOSIS — F0150 Vascular dementia without behavioral disturbance: Secondary | ICD-10-CM | POA: Diagnosis not present

## 2020-04-25 DIAGNOSIS — I10 Essential (primary) hypertension: Secondary | ICD-10-CM | POA: Diagnosis not present

## 2020-04-25 DIAGNOSIS — K219 Gastro-esophageal reflux disease without esophagitis: Secondary | ICD-10-CM | POA: Diagnosis not present

## 2020-04-25 DIAGNOSIS — I951 Orthostatic hypotension: Secondary | ICD-10-CM | POA: Diagnosis not present

## 2020-04-25 DIAGNOSIS — E785 Hyperlipidemia, unspecified: Secondary | ICD-10-CM | POA: Diagnosis not present

## 2020-04-25 DIAGNOSIS — D485 Neoplasm of uncertain behavior of skin: Secondary | ICD-10-CM | POA: Diagnosis not present

## 2020-04-30 DIAGNOSIS — D485 Neoplasm of uncertain behavior of skin: Secondary | ICD-10-CM | POA: Diagnosis not present

## 2020-04-30 DIAGNOSIS — E785 Hyperlipidemia, unspecified: Secondary | ICD-10-CM | POA: Diagnosis not present

## 2020-04-30 DIAGNOSIS — G901 Familial dysautonomia [Riley-Day]: Secondary | ICD-10-CM | POA: Diagnosis not present

## 2020-04-30 DIAGNOSIS — I10 Essential (primary) hypertension: Secondary | ICD-10-CM | POA: Diagnosis not present

## 2020-04-30 DIAGNOSIS — K219 Gastro-esophageal reflux disease without esophagitis: Secondary | ICD-10-CM | POA: Diagnosis not present

## 2020-04-30 DIAGNOSIS — I951 Orthostatic hypotension: Secondary | ICD-10-CM | POA: Diagnosis not present

## 2020-04-30 DIAGNOSIS — R627 Adult failure to thrive: Secondary | ICD-10-CM | POA: Diagnosis not present

## 2020-04-30 DIAGNOSIS — E559 Vitamin D deficiency, unspecified: Secondary | ICD-10-CM | POA: Diagnosis not present

## 2020-04-30 DIAGNOSIS — F0150 Vascular dementia without behavioral disturbance: Secondary | ICD-10-CM | POA: Diagnosis not present

## 2020-05-05 DIAGNOSIS — H6123 Impacted cerumen, bilateral: Secondary | ICD-10-CM | POA: Diagnosis not present

## 2020-05-07 DIAGNOSIS — I10 Essential (primary) hypertension: Secondary | ICD-10-CM | POA: Diagnosis not present

## 2020-05-07 DIAGNOSIS — I951 Orthostatic hypotension: Secondary | ICD-10-CM | POA: Diagnosis not present

## 2020-05-07 DIAGNOSIS — D485 Neoplasm of uncertain behavior of skin: Secondary | ICD-10-CM | POA: Diagnosis not present

## 2020-05-07 DIAGNOSIS — E785 Hyperlipidemia, unspecified: Secondary | ICD-10-CM | POA: Diagnosis not present

## 2020-05-07 DIAGNOSIS — F0150 Vascular dementia without behavioral disturbance: Secondary | ICD-10-CM | POA: Diagnosis not present

## 2020-05-07 DIAGNOSIS — G901 Familial dysautonomia [Riley-Day]: Secondary | ICD-10-CM | POA: Diagnosis not present

## 2020-05-07 DIAGNOSIS — E559 Vitamin D deficiency, unspecified: Secondary | ICD-10-CM | POA: Diagnosis not present

## 2020-05-07 DIAGNOSIS — K219 Gastro-esophageal reflux disease without esophagitis: Secondary | ICD-10-CM | POA: Diagnosis not present

## 2020-05-07 DIAGNOSIS — R627 Adult failure to thrive: Secondary | ICD-10-CM | POA: Diagnosis not present

## 2020-05-13 DIAGNOSIS — K219 Gastro-esophageal reflux disease without esophagitis: Secondary | ICD-10-CM | POA: Diagnosis not present

## 2020-05-13 DIAGNOSIS — E559 Vitamin D deficiency, unspecified: Secondary | ICD-10-CM | POA: Diagnosis not present

## 2020-05-13 DIAGNOSIS — E785 Hyperlipidemia, unspecified: Secondary | ICD-10-CM | POA: Diagnosis not present

## 2020-05-13 DIAGNOSIS — G901 Familial dysautonomia [Riley-Day]: Secondary | ICD-10-CM | POA: Diagnosis not present

## 2020-05-13 DIAGNOSIS — I951 Orthostatic hypotension: Secondary | ICD-10-CM | POA: Diagnosis not present

## 2020-05-13 DIAGNOSIS — F0150 Vascular dementia without behavioral disturbance: Secondary | ICD-10-CM | POA: Diagnosis not present

## 2020-05-13 DIAGNOSIS — R627 Adult failure to thrive: Secondary | ICD-10-CM | POA: Diagnosis not present

## 2020-05-13 DIAGNOSIS — I10 Essential (primary) hypertension: Secondary | ICD-10-CM | POA: Diagnosis not present

## 2020-05-13 DIAGNOSIS — D485 Neoplasm of uncertain behavior of skin: Secondary | ICD-10-CM | POA: Diagnosis not present

## 2020-05-14 ENCOUNTER — Encounter: Payer: Self-pay | Admitting: Internal Medicine

## 2020-05-18 ENCOUNTER — Other Ambulatory Visit: Payer: Self-pay | Admitting: Internal Medicine

## 2020-05-18 DIAGNOSIS — K219 Gastro-esophageal reflux disease without esophagitis: Secondary | ICD-10-CM

## 2020-05-22 ENCOUNTER — Encounter (HOSPITAL_COMMUNITY): Payer: Self-pay

## 2020-05-22 ENCOUNTER — Telehealth: Payer: Self-pay | Admitting: *Deleted

## 2020-05-22 ENCOUNTER — Emergency Department (HOSPITAL_COMMUNITY): Payer: Medicare HMO

## 2020-05-22 ENCOUNTER — Emergency Department (HOSPITAL_COMMUNITY)
Admission: EM | Admit: 2020-05-22 | Discharge: 2020-05-22 | Disposition: A | Discharge status: Home / Self Care | Payer: Medicare HMO | Attending: Emergency Medicine | Admitting: Emergency Medicine

## 2020-05-22 ENCOUNTER — Other Ambulatory Visit: Payer: Self-pay

## 2020-05-22 DIAGNOSIS — G909 Disorder of the autonomic nervous system, unspecified: Secondary | ICD-10-CM | POA: Diagnosis not present

## 2020-05-22 DIAGNOSIS — M549 Dorsalgia, unspecified: Secondary | ICD-10-CM | POA: Insufficient documentation

## 2020-05-22 DIAGNOSIS — J811 Chronic pulmonary edema: Secondary | ICD-10-CM | POA: Diagnosis not present

## 2020-05-22 DIAGNOSIS — J9811 Atelectasis: Secondary | ICD-10-CM | POA: Insufficient documentation

## 2020-05-22 DIAGNOSIS — I6529 Occlusion and stenosis of unspecified carotid artery: Secondary | ICD-10-CM | POA: Diagnosis not present

## 2020-05-22 DIAGNOSIS — I998 Other disorder of circulatory system: Secondary | ICD-10-CM | POA: Diagnosis not present

## 2020-05-22 DIAGNOSIS — I739 Peripheral vascular disease, unspecified: Secondary | ICD-10-CM | POA: Diagnosis not present

## 2020-05-22 DIAGNOSIS — I10 Essential (primary) hypertension: Secondary | ICD-10-CM | POA: Diagnosis not present

## 2020-05-22 DIAGNOSIS — R55 Syncope and collapse: Secondary | ICD-10-CM | POA: Insufficient documentation

## 2020-05-22 DIAGNOSIS — G9389 Other specified disorders of brain: Secondary | ICD-10-CM | POA: Diagnosis not present

## 2020-05-22 DIAGNOSIS — R531 Weakness: Secondary | ICD-10-CM | POA: Diagnosis not present

## 2020-05-22 LAB — BASIC METABOLIC PANEL
Anion gap: 8 (ref 5–15)
Anion gap: 8 (ref 5–15)
BUN: 25 mg/dL — ABNORMAL HIGH (ref 8–23)
BUN: 24 mg/dL — ABNORMAL HIGH (ref 8–23)
CO2: 28 mmol/L (ref 22–32)
CO2: 27 mmol/L (ref 22–32)
Calcium: 9.1 mg/dL (ref 8.9–10.3)
Calcium: 9 mg/dL (ref 8.9–10.3)
Chloride: 105 mmol/L (ref 98–111)
Chloride: 109 mmol/L (ref 98–111)
Creatinine, Ser: 2.02 mg/dL — ABNORMAL HIGH (ref 0.61–1.24)
Creatinine, Ser: 1.98 mg/dL — ABNORMAL HIGH (ref 0.61–1.24)
GFR calc Af Amer: 36 mL/min — ABNORMAL LOW (ref >60)
GFR calc Af Amer: 37 mL/min — ABNORMAL LOW (ref >60)
GFR calc non Af Amer: 31 mL/min — ABNORMAL LOW (ref >60)
GFR calc non Af Amer: 32 mL/min — ABNORMAL LOW (ref >60)
Glucose, Bld: 120 mg/dL — ABNORMAL HIGH (ref 70–99)
Glucose, Bld: 140 mg/dL — ABNORMAL HIGH (ref 70–99)
Potassium: 5.3 mmol/L — ABNORMAL HIGH (ref 3.5–5.1)
Potassium: 3.7 mmol/L (ref 3.5–5.1)
Sodium: 141 mmol/L (ref 135–145)
Sodium: 144 mmol/L (ref 135–145)

## 2020-05-22 LAB — CBC WITH DIFFERENTIAL/PLATELET
Abs Immature Granulocytes: 0.08 10*3/uL — ABNORMAL HIGH (ref 0.00–0.07)
Basophils Absolute: 0 10*3/uL (ref 0.0–0.1)
Basophils Relative: 0 %
Eosinophils Absolute: 0 10*3/uL (ref 0.0–0.5)
Eosinophils Relative: 0 %
HCT: 40.7 % (ref 39.0–52.0)
Hemoglobin: 13.4 g/dL (ref 13.0–17.0)
Immature Granulocytes: 1 %
Lymphocytes Relative: 5 %
Lymphs Abs: 0.7 10*3/uL (ref 0.7–4.0)
MCH: 30.3 pg (ref 26.0–34.0)
MCHC: 32.9 g/dL (ref 30.0–36.0)
MCV: 92.1 fL (ref 80.0–100.0)
Monocytes Absolute: 0.8 10*3/uL (ref 0.1–1.0)
Monocytes Relative: 5 %
Neutro Abs: 12.9 10*3/uL — ABNORMAL HIGH (ref 1.7–7.7)
Neutrophils Relative %: 89 %
Platelets: 178 10*3/uL (ref 150–400)
RBC: 4.42 MIL/uL (ref 4.22–5.81)
RDW: 13.8 % (ref 11.5–15.5)
WBC: 14.5 10*3/uL — ABNORMAL HIGH (ref 4.0–10.5)
nRBC: 0 % (ref 0.0–0.2)

## 2020-05-22 LAB — URINALYSIS, ROUTINE W REFLEX MICROSCOPIC
Bacteria, UA: NONE SEEN
Bilirubin Urine: NEGATIVE
Glucose, UA: 50 mg/dL — AB
Ketones, ur: NEGATIVE mg/dL
Leukocytes,Ua: NEGATIVE
Nitrite: NEGATIVE
Protein, ur: NEGATIVE mg/dL
Specific Gravity, Urine: 1.012 (ref 1.005–1.030)
pH: 7 (ref 5.0–8.0)

## 2020-05-22 LAB — TROPONIN I (HIGH SENSITIVITY)
Troponin I (High Sensitivity): 9 ng/L (ref <18)
Troponin I (High Sensitivity): 12 ng/L (ref <18)

## 2020-05-22 LAB — MAGNESIUM: Magnesium: 2.7 mg/dL — ABNORMAL HIGH (ref 1.7–2.4)

## 2020-05-22 MED ORDER — DOXYCYCLINE HYCLATE 100 MG PO CAPS
100.0000 mg | ORAL_CAPSULE | Freq: Two times a day (BID) | ORAL | 0 refills | Status: AC
Start: 2020-05-22 — End: 2020-05-29

## 2020-05-22 MED ORDER — SODIUM CHLORIDE 0.9 % IV BOLUS
1000.0000 mL | Freq: Once | INTRAVENOUS | Status: AC
  Administered 2020-05-22: 1000 mL via INTRAVENOUS

## 2020-05-22 MED ORDER — DOXYCYCLINE HYCLATE 100 MG PO TABS: 100.0000 mg | ORAL_TABLET | Freq: Once | ORAL | Status: DC

## 2020-05-22 NOTE — Telephone Encounter (Signed)
Merry Proud, physical therapist, called and requested to extent PT 1 time a week for 4 more weeks. OK, per Dr Melford Aase.

## 2020-05-22 NOTE — ED Triage Notes (Signed)
Pt arrived via EMS from home. Wife reports after coming back from errands the patient was unable to stand and sat on the stairs in front of the home before she called the fire department for help. EMS reports the fire department noted the pt to have a near syncopal episode when they helped his to get up. Pt has a hx of dementia.

## 2020-05-22 NOTE — Discharge Instructions (Signed)
We started treatment for a POSSIBLE pneumonia in your left lower lung.  You will take doxycycline for 7 days as prescribed.  Please follow up with your primary care doctor this week for your ER visit.  We talked about your fall and your back pain.  This may be related to a spine fracture.  I offered a CT scan to look for fracture, versus the option of follow up with your doctor.  Because your back pain was minimal, and you have no signs of nerve damage, I felt it was reasonable to discharge you.  You and your wife told me you preferred to go home and not get the CT scan today. If you are still having back pain in 1 week, your doctor may wish to get a CT scan.

## 2020-05-22 NOTE — ED Notes (Signed)
Wife verbalizes understanding of discharge instructions. Opportunity for questioning and answers were provided. Armband removed by staff, pt discharged from ED via wheelchair.

## 2020-05-22 NOTE — ED Provider Notes (Signed)
[facility] DEPARTMENT Provider Note   CSN: [id number] Arrival date & time: 05/22/20  1614     History Chief Complaint  Patient presents with  . Fatigue  . Near Syncope    Mathew Valdez. is a 74 y.o. male with a history of orthostatic hypotension presented to emergency department with a near syncopal episode.  His wife reports that after getting home from [place], they were trying to help the patient up the stairs to the house.  She said he became lightheaded and lost consciousness.  She said this is happened often times in the past, usually lastes a few seconds and "he wakes up," but today "he wasn't getting up and turning blue."  EMS arrived on scene and back the patient briefly, he did regain consciousness.  His wife reports is been an ongoing issue with him due to "low blood pressure".  His documented history of orthostatic hypotension is prescribed midodrine 10 mg TID and florinef 0.1 mg TID, which he has been taking (last dose this morning).  His wife reports that he has near syncopal episodes multiple times per week.  They are working with PT for this.  She reports yesterday she had another episode upon standing and fell backwards and struck his head on the ground.  The patient denies an active headache.  He is not on blood thinners.  She states he been worked up for other causes in the past including wearing a Holter monitor but there were no noted arrhythmias.  Per his medical records, his last office visit with Dr. Melford Aase on 03/13/20 notes a hx of vascular dementia and posterula hypotension/labile supine with dysautonomia  Last eecho on 04/10/19 with EF > 65% hyperdynamic, no aortic stenosis noted. holter monitr record from 03/14/19 showing sinus rhythm with occassional PVC's and PAC's, short run of SVT, no sustained arrhythmias.  He takes potassium and magnesium supplements.   HPI     Past Medical History:  Diagnosis Date  . Constipation     . Hyperlipidemia   . Hypertension   . Prediabetes    no meds   . Vitamin D deficiency     Patient Active Problem List   Diagnosis Date Noted  . B12 deficiency 03/26/2020  . Unstable gait 10/01/2019  . Frequent falls 10/01/2019  . Imbalance 02/27/2018  . BMI 24.0-24.9, adult 02/26/2018  . Acid reflux 02/26/2018  . Memory changes 02/26/2018  . Vitamin D deficiency 03/01/2014  . Medication management 03/01/2014  . Hyperlipidemia   . History of prediabetes   . HTN (hypertension) 10/09/2013    Past Surgical History:  Procedure Laterality Date  . CATARACT EXTRACTION, BILATERAL  2017  . COLONOSCOPY    . NO PAST SURGERIES         Family History  Problem Relation Age of Onset  . Hyperlipidemia Mother   . Glaucoma Mother   . Heart disease Father   . Hypertension Father   . Colon cancer Neg Hx   . Esophageal cancer Neg Hx   . Pancreatic cancer Neg Hx   . Prostate cancer Neg Hx   . Rectal cancer Neg Hx   . Stomach cancer Neg Hx     Social History   Tobacco Use  . Smoking status: Never Smoker  . Smokeless tobacco: Never Used  Vaping Use  . Vaping Use: Never used  Substance Use Topics  . Alcohol use: Yes    Alcohol/week: 0.0 standard drinks    Comment:  very Rare /Beer.wine.  . Drug use: No    Home Medications Prior to Admission medications   Medication Sig Start Date End Date Taking? Authorizing Provider  Accu-Chek Softclix Lancets lancets Check blood sugar 1 time a day.[id number]. Patient taking differently: 1 each by Other route See admin instructions. Check blood sugar 1 time a day.[id number]. 12/17/19  Yes Unk Pinto, MD  Alcohol Swabs (B-D SINGLE USE SWABS REGULAR) PADS Check blood sugar 1 time daily-[id number]. Patient taking differently: 1 each by Other route See admin instructions. Check blood sugar 1 time daily-[id number]. 12/17/19  Yes Unk Pinto, MD  aspirin EC 81 MG tablet Take 81 mg by mouth daily. Swallow whole.   Yes [provider]  Blood Glucose Monitoring Suppl (ACCU-CHEK AVIVA PLUS) w/Device KIT USE AS DIRECTED Patient taking differently: 1 each by Other route as directed.  12/20/19  Yes Liane Comber, NP  fludrocortisone (FLORINEF) 0.1 MG tablet Take 2 tablets 3 x/day  to maintain Standing BP over 100 Patient taking differently: Take 0.1 mg by mouth 2 (two) times daily.  01/23/20  Yes Unk Pinto, MD  glucose blood (ACCU-CHEK AVIVA PLUS) test strip Check blood sugar 1 time a day-[id number] Patient taking differently: 1 each by Other route See admin instructions. Check blood sugar 1 time a day-[id number] 12/17/19  Yes Unk Pinto, MD  Magnesium 250 MG TABS Take 250 mg by mouth daily.   Yes [provider]  midodrine (PROAMATINE) 10 MG tablet Take 1 tablet 3 x /day for Low BP Patient taking differently: Take 10 mg by mouth 3 (three) times daily.  12/17/19  Yes Unk Pinto, MD  mirabegron ER (MYRBETRIQ) 50 MG TB24 tablet Take 1 tablet Daily for Bladder Patient taking differently: Take 50 mg by mouth daily.  01/09/20  Yes Unk Pinto, MD  potassium chloride SA (KLOR-CON) 20 MEQ tablet Take 1 tablet 2 x /day for Potassium Patient taking differently: Take 20 mEq by mouth 2 (two) times daily.  12/17/19  Yes Unk Pinto, MD  QUEtiapine (SEROQUEL) 50 MG tablet Take 1 to 2 tablets in late Afternoon for "Sundowning" Patient taking differently: Take 50-100 mg by mouth 3 (three) times daily. 49m am, 571mlunch, 10045mm. 11/15/19  Yes McKUnk PintoD  rosuvastatin (CRESTOR) 5 MG tablet Take 1 tablet Daily for Cholesterol Patient taking differently: Take 5 mg by mouth daily.  11/23/19  Yes McKUnk PintoD  senna-docusate (GNP STOOL SOFTENER/LAXATIVE) 8.6-50 MG tablet Take 1 tablet Daily for Constipation Patient taking differently: Take 1 tablet by mouth daily.  10/20/19  Yes McKUnk PintoD  vitamin B-12 (CYANOCOBALAMIN) 500 MCG tablet Dissolve 1 tablet under tongue daily 06/29/18  Yes McKUnk PintoD  VITAMIN D PO Take 5,000 Units by mouth daily.    Yes [provider]  doxycycline (VIBRAMYCIN) 100 MG capsule Take 1 capsule (100 mg total) by mouth 2 (two) times daily for 7 days. 05/22/20 05/29/20  TriWyvonnia DuskyD  omeprazole (PRILOSEC) 20 MG capsule Take 1 capsule   2 x   /day   to Prevent Heartburn & Indigestion Patient not taking: Reported on 05/22/2020 05/18/20   McKUnk PintoD    Allergies    Hydrocodone-acetaminophen, Sudafed [pseudoephedrine hcl], and Tetanus toxoids  Review of Systems   Review of Systems  Constitutional: Negative for chills and fever.  HENT: Negative for ear pain and sore throat.   Eyes: Negative for pain and visual disturbance.  Respiratory: Negative for cough and shortness of breath.  Cardiovascular: Negative for chest pain and palpitations.  Gastrointestinal: Negative for abdominal pain and vomiting.  Genitourinary: Negative for dysuria and hematuria.  Musculoskeletal: Positive for back pain. Negative for arthralgias.  Skin: Negative for color change and rash.  Neurological: Positive for dizziness and light-headedness. Negative for facial asymmetry and weakness.  All other systems reviewed and are negative.   Physical Exam Updated Vital Signs BP (!) 193/111   Pulse 87   Temp 97.9 F (36.6 C) (Oral)   Resp 17   SpO2 99%   Physical Exam Vitals and nursing note reviewed.  Constitutional:      Appearance: He is well-developed.     Comments: Has dementia, at baseline per wife  HENT:     Head: Normocephalic and atraumatic.  Eyes:     Conjunctiva/sclera: Conjunctivae normal.  Cardiovascular:     Rate and Rhythm: Normal rate and regular rhythm.     Pulses: Normal pulses.  Pulmonary:     Effort: Pulmonary effort is normal. No respiratory distress.     Breath sounds: Normal breath sounds.  Abdominal:     General: There is no distension.     Palpations: Abdomen is soft.     Tenderness: There is no abdominal tenderness.  There is no guarding.  Musculoskeletal:     Cervical back: Neck supple.  Skin:    General: Skin is warm and dry.  Neurological:     General: No focal deficit present.     Mental Status: He is alert. Mental status is at baseline.     Cranial Nerves: No cranial nerve deficit.     Sensory: No sensory deficit.     Motor: No weakness.  Psychiatric:        Mood and Affect: Mood normal.        Behavior: Behavior normal.     ED Results / Procedures / Treatments   Labs (all labs ordered are listed, but only abnormal results are displayed) Labs Reviewed  BASIC METABOLIC PANEL - Abnormal; Notable for the following components:      Result Value   Potassium 5.3 (*)    Glucose, Bld 120 (*)    BUN 25 (*)    Creatinine, Ser 2.02 (*)    GFR calc non Af Amer 31 (*)    GFR calc Af Amer 36 (*)    All other components within normal limits  CBC WITH DIFFERENTIAL/PLATELET - Abnormal; Notable for the following components:   WBC 14.5 (*)    Neutro Abs 12.9 (*)    Abs Immature Granulocytes 0.08 (*)    All other components within normal limits  MAGNESIUM - Abnormal; Notable for the following components:   Magnesium 2.7 (*)    All other components within normal limits  URINALYSIS, ROUTINE W REFLEX MICROSCOPIC - Abnormal; Notable for the following components:   Glucose, UA 50 (*)    Hgb urine dipstick SMALL (*)    All other components within normal limits  BASIC METABOLIC PANEL - Abnormal; Notable for the following components:   Glucose, Bld 140 (*)    BUN 24 (*)    Creatinine, Ser 1.98 (*)    GFR calc non Af Amer 32 (*)    GFR calc Af Amer 37 (*)    All other components within normal limits  TROPONIN I (HIGH SENSITIVITY)  TROPONIN I (HIGH SENSITIVITY)    EKG EKG Interpretation  Date/Time:  Thursday May 22 2020 17:56:15 EDT Ventricular Rate:  83 PR Interval:  QRS Duration: 98 QT Interval:  421 QTC Calculation: 495 R Axis:   -20 Text Interpretation: Age not entered, assumed to  be  74 years old for purpose of ECG interpretation Sinus rhythm Prolonged PR interval Borderline left axis deviation Abnormal R-wave progression, early transition Borderline prolonged QT interval No STEMI Confirmed by Octaviano Glow [phone]) on 05/22/2020 6:02:30 PM   Radiology CT Head Wo Contrast  Result Date: 05/22/2020 CLINICAL DATA:  Near syncope, altered mental status EXAM: CT HEAD WITHOUT CONTRAST TECHNIQUE: Contiguous axial images were obtained from the base of the skull through the vertex without intravenous contrast. COMPARISON:  MRI 01/19/2018 FINDINGS: Brain: Likely remote lacunar infarcts in the frontal white matter bilaterally. No evidence of acute infarction, hemorrhage, hydrocephalus, extra-axial collection or mass lesion/mass effect. Symmetric prominence of the ventricles, cisterns and sulci compatible with parenchymal volume loss. Patchy areas of white matter hypoattenuation are most compatible with chronic microvascular angiopathy. Benign appearing dural calcifications. Vascular: Atherosclerotic calcification of the carotid siphons. No hyperdense vessel. Skull: No significant acute scalp swelling or hematoma Sinuses/Orbits: Paranasal sinuses and mastoid air cells are predominantly clear. Orbital structures are unremarkable aside from prior lens extractions. Other: None IMPRESSION: 1. No acute intracranial findings. 2. Remote bifrontal lacunar infarcts. 3. Chronic microvascular angiopathy and parenchymal volume loss. Electronically Signed   By: Lovena Le M.D.   On: 05/22/2020 18:59   DG Chest Portable 1 View  Result Date: 05/22/2020 CLINICAL DATA:  Weakness. EXAM: PORTABLE CHEST 1 VIEW COMPARISON:  None. FINDINGS: The heart size and mediastinal contours are within normal limits. There appears to be mild vascular congestion without overt pulmonary edema. There is no focal infiltrate. There is probable atelectasis at the left costophrenic angle. The visualized skeletal structures are  unremarkable. Atherosclerotic changes are noted of the thoracic aorta. IMPRESSION: 1. Mild vascular congestion without overt pulmonary edema. 2. Probable atelectasis at the left costophrenic angle. Electronically Signed   By: Constance Holster M.D.   On: 05/22/2020 19:50    Procedures Procedures (including critical care time)  Medications Ordered in ED Medications  doxycycline (VIBRA-TABS) tablet 100 mg (has no administration in time range)  sodium chloride 0.9 % bolus 1,000 mL (0 mLs Intravenous Stopped 05/22/20 1912)    ED Course  I have reviewed the triage vital signs and the nursing notes.  Pertinent labs & imaging results that were available during my care of the patient were reviewed by me and considered in my medical decision making (see chart for details).  This is a 74 year old male with a history of autonomic dysfunction, frequent episodes of symptomatic hypotension, on midodrine and fluticasone 3 times daily, presenting to emergency department with hypotension and unresponsiveness at home. This occurred during exertion while attempting to climb stairs. He was hypotensive for EMS on their arrival. Since arriving in the ED, his blood pressure appears have stabilized and the patient is now awake and back to his baseline mental state.  His wife reports that this happens all the time to them. She says this is their "normal" state, unfortunately. She wonders whether anything else can be done to manage his episodes.  I personally reviewed the patient's medical history including his Holter monitor in 2020 as well as his echocardiogram in 2020. It seems unlikely that he is having acute cardiac events. Seems more likely that he has autonomic dysfunction or disregulatory syndrome, as noted by his PCP. This is unfortunate very difficult to manage. He will continue with the midodrine and the florinef at home.  He is also working with physical therapy, which she says has been helping him at home.  Unfortunately he continues to have frequent falls, and there is no simple remedy for this.  He is not on anticoagulation.  I do feel is prudent to perform the medical work-up to evaluate for other causes of near syncope. His EKG here showed sinus rhythm without acute ischemia per my interpretation.  Trops were 9 -> 12.  Doubt ACS or PE with this presentation.  His Bmp was near baseline without significant AKI.  Glucose was wnl.  UA without evidence of infection.  Hgb 13.4, doubt anemia.  His WBC was elevated at 14.5.  He is on florinef which may contribute to this.  However his xray shows suggestion of LLL atelectasis vs PNA. I discussed the pros and cons of treatment for presumptive pneumonia versus watching and waiting with PCP follow-up, they would prefer treatment at this time. I do not think that is unreasonable. We will start on 7 days of doxycycline.  I personally reviewed the patient's prior medical history, obtain additional history from his wife, and reviewed his labs in the ED.  Clinical Course as of May 22 2210  Thu May 22, 2020  1912 No acute findings reported on [facility]  Remote infarcts consistent with his history.     [MT]  2106 Asymptomatic.  Now hypertensive.  His wife states his BP fluctuates like this with exertion including during PT.  I suspect this is all related to his autonomic dysfunction.  I think he's stable for discharge.  We discussed empiric treatment for LLL PNA based on his xray finding and leukocytosis, vs watch and wait with PCP f/u, and he and his wife prefer to initiate treatment.  Will prescribe doxy for 7 days.   [MT]  2119 At the time of discharge, patient was reporting to me that he was having pain in his lower back.  His wife said has been hurting for a week since he fell.  On exam it is difficult ascertain whether this is truly spinal midline tenderness near the L-spine or paraspinal tenderness.  I did offer them a CT scan to evaluate for spinal fracture.  They  did not wish to wait for this.  I do think it is reasonable, given that he is not having any significant pain, that he is ambulatory, that he does not have any neurological deficits to suggest spinal cord compression, to discharge him with follow-up with his doctor.  If he is still having low back pain in a week, his doctor can obtain a CT scan as an outpatient.   [MT]    Clinical Course User Index [MT] Francisco Ostrovsky, Carola Rhine, MD   Final Clinical Impression(s) / ED Diagnoses Final diagnoses:  Atelectasis  Near syncope  Autonomic dysfunction    Rx / DC Orders ED Discharge Orders         Ordered    doxycycline (VIBRAMYCIN) 100 MG capsule  2 times daily     Discontinue  Reprint     05/22/20 2131           Wyvonnia Dusky, MD 05/22/20 2211

## 2020-05-30 DIAGNOSIS — E785 Hyperlipidemia, unspecified: Secondary | ICD-10-CM | POA: Diagnosis not present

## 2020-05-30 DIAGNOSIS — K219 Gastro-esophageal reflux disease without esophagitis: Secondary | ICD-10-CM | POA: Diagnosis not present

## 2020-05-30 DIAGNOSIS — R627 Adult failure to thrive: Secondary | ICD-10-CM | POA: Diagnosis not present

## 2020-05-30 DIAGNOSIS — D485 Neoplasm of uncertain behavior of skin: Secondary | ICD-10-CM | POA: Diagnosis not present

## 2020-05-30 DIAGNOSIS — E559 Vitamin D deficiency, unspecified: Secondary | ICD-10-CM | POA: Diagnosis not present

## 2020-05-30 DIAGNOSIS — F0150 Vascular dementia without behavioral disturbance: Secondary | ICD-10-CM | POA: Diagnosis not present

## 2020-05-30 DIAGNOSIS — I951 Orthostatic hypotension: Secondary | ICD-10-CM | POA: Diagnosis not present

## 2020-05-30 DIAGNOSIS — I10 Essential (primary) hypertension: Secondary | ICD-10-CM | POA: Diagnosis not present

## 2020-05-30 DIAGNOSIS — G901 Familial dysautonomia [Riley-Day]: Secondary | ICD-10-CM | POA: Diagnosis not present

## 2020-06-06 DIAGNOSIS — D485 Neoplasm of uncertain behavior of skin: Secondary | ICD-10-CM | POA: Diagnosis not present

## 2020-06-06 DIAGNOSIS — I951 Orthostatic hypotension: Secondary | ICD-10-CM | POA: Diagnosis not present

## 2020-06-06 DIAGNOSIS — R627 Adult failure to thrive: Secondary | ICD-10-CM | POA: Diagnosis not present

## 2020-06-06 DIAGNOSIS — I10 Essential (primary) hypertension: Secondary | ICD-10-CM | POA: Diagnosis not present

## 2020-06-06 DIAGNOSIS — F0150 Vascular dementia without behavioral disturbance: Secondary | ICD-10-CM | POA: Diagnosis not present

## 2020-06-06 DIAGNOSIS — K219 Gastro-esophageal reflux disease without esophagitis: Secondary | ICD-10-CM | POA: Diagnosis not present

## 2020-06-06 DIAGNOSIS — E559 Vitamin D deficiency, unspecified: Secondary | ICD-10-CM | POA: Diagnosis not present

## 2020-06-06 DIAGNOSIS — E785 Hyperlipidemia, unspecified: Secondary | ICD-10-CM | POA: Diagnosis not present

## 2020-06-06 DIAGNOSIS — G901 Familial dysautonomia [Riley-Day]: Secondary | ICD-10-CM | POA: Diagnosis not present

## 2020-06-09 DIAGNOSIS — R3915 Urgency of urination: Secondary | ICD-10-CM | POA: Diagnosis not present

## 2020-06-09 DIAGNOSIS — N401 Enlarged prostate with lower urinary tract symptoms: Secondary | ICD-10-CM | POA: Diagnosis not present

## 2020-06-12 DIAGNOSIS — N3000 Acute cystitis without hematuria: Secondary | ICD-10-CM | POA: Diagnosis not present

## 2020-06-13 DIAGNOSIS — I10 Essential (primary) hypertension: Secondary | ICD-10-CM | POA: Diagnosis not present

## 2020-06-13 DIAGNOSIS — G901 Familial dysautonomia [Riley-Day]: Secondary | ICD-10-CM | POA: Diagnosis not present

## 2020-06-13 DIAGNOSIS — D485 Neoplasm of uncertain behavior of skin: Secondary | ICD-10-CM | POA: Diagnosis not present

## 2020-06-13 DIAGNOSIS — R627 Adult failure to thrive: Secondary | ICD-10-CM | POA: Diagnosis not present

## 2020-06-13 DIAGNOSIS — E559 Vitamin D deficiency, unspecified: Secondary | ICD-10-CM | POA: Diagnosis not present

## 2020-06-13 DIAGNOSIS — F0150 Vascular dementia without behavioral disturbance: Secondary | ICD-10-CM | POA: Diagnosis not present

## 2020-06-13 DIAGNOSIS — I951 Orthostatic hypotension: Secondary | ICD-10-CM | POA: Diagnosis not present

## 2020-06-13 DIAGNOSIS — K219 Gastro-esophageal reflux disease without esophagitis: Secondary | ICD-10-CM | POA: Diagnosis not present

## 2020-06-13 DIAGNOSIS — E785 Hyperlipidemia, unspecified: Secondary | ICD-10-CM | POA: Diagnosis not present

## 2020-06-16 DIAGNOSIS — G901 Familial dysautonomia [Riley-Day]: Secondary | ICD-10-CM | POA: Diagnosis not present

## 2020-06-16 DIAGNOSIS — D485 Neoplasm of uncertain behavior of skin: Secondary | ICD-10-CM | POA: Diagnosis not present

## 2020-06-16 DIAGNOSIS — H903 Sensorineural hearing loss, bilateral: Secondary | ICD-10-CM | POA: Diagnosis not present

## 2020-06-16 DIAGNOSIS — K219 Gastro-esophageal reflux disease without esophagitis: Secondary | ICD-10-CM | POA: Diagnosis not present

## 2020-06-16 DIAGNOSIS — E559 Vitamin D deficiency, unspecified: Secondary | ICD-10-CM | POA: Diagnosis not present

## 2020-06-16 DIAGNOSIS — F0150 Vascular dementia without behavioral disturbance: Secondary | ICD-10-CM | POA: Diagnosis not present

## 2020-06-16 DIAGNOSIS — I10 Essential (primary) hypertension: Secondary | ICD-10-CM | POA: Diagnosis not present

## 2020-06-16 DIAGNOSIS — R627 Adult failure to thrive: Secondary | ICD-10-CM | POA: Diagnosis not present

## 2020-06-16 DIAGNOSIS — I951 Orthostatic hypotension: Secondary | ICD-10-CM | POA: Diagnosis not present

## 2020-06-16 DIAGNOSIS — E785 Hyperlipidemia, unspecified: Secondary | ICD-10-CM | POA: Diagnosis not present

## 2020-06-20 DIAGNOSIS — D485 Neoplasm of uncertain behavior of skin: Secondary | ICD-10-CM | POA: Diagnosis not present

## 2020-06-20 DIAGNOSIS — I10 Essential (primary) hypertension: Secondary | ICD-10-CM | POA: Diagnosis not present

## 2020-06-20 DIAGNOSIS — E559 Vitamin D deficiency, unspecified: Secondary | ICD-10-CM | POA: Diagnosis not present

## 2020-06-20 DIAGNOSIS — E785 Hyperlipidemia, unspecified: Secondary | ICD-10-CM | POA: Diagnosis not present

## 2020-06-20 DIAGNOSIS — K219 Gastro-esophageal reflux disease without esophagitis: Secondary | ICD-10-CM | POA: Diagnosis not present

## 2020-06-20 DIAGNOSIS — I951 Orthostatic hypotension: Secondary | ICD-10-CM | POA: Diagnosis not present

## 2020-06-20 DIAGNOSIS — G901 Familial dysautonomia [Riley-Day]: Secondary | ICD-10-CM | POA: Diagnosis not present

## 2020-06-20 DIAGNOSIS — F0150 Vascular dementia without behavioral disturbance: Secondary | ICD-10-CM | POA: Diagnosis not present

## 2020-06-20 DIAGNOSIS — R627 Adult failure to thrive: Secondary | ICD-10-CM | POA: Diagnosis not present

## 2020-06-24 DIAGNOSIS — F0150 Vascular dementia without behavioral disturbance: Secondary | ICD-10-CM | POA: Diagnosis not present

## 2020-06-24 DIAGNOSIS — I10 Essential (primary) hypertension: Secondary | ICD-10-CM | POA: Diagnosis not present

## 2020-06-24 DIAGNOSIS — I951 Orthostatic hypotension: Secondary | ICD-10-CM | POA: Diagnosis not present

## 2020-06-24 DIAGNOSIS — D485 Neoplasm of uncertain behavior of skin: Secondary | ICD-10-CM | POA: Diagnosis not present

## 2020-06-24 DIAGNOSIS — R627 Adult failure to thrive: Secondary | ICD-10-CM | POA: Diagnosis not present

## 2020-06-24 DIAGNOSIS — E785 Hyperlipidemia, unspecified: Secondary | ICD-10-CM | POA: Diagnosis not present

## 2020-06-24 DIAGNOSIS — G901 Familial dysautonomia [Riley-Day]: Secondary | ICD-10-CM | POA: Diagnosis not present

## 2020-06-24 DIAGNOSIS — E559 Vitamin D deficiency, unspecified: Secondary | ICD-10-CM | POA: Diagnosis not present

## 2020-06-24 DIAGNOSIS — K219 Gastro-esophageal reflux disease without esophagitis: Secondary | ICD-10-CM | POA: Diagnosis not present

## 2020-06-25 DIAGNOSIS — R109 Unspecified abdominal pain: Secondary | ICD-10-CM | POA: Diagnosis not present

## 2020-06-27 DIAGNOSIS — F0150 Vascular dementia without behavioral disturbance: Secondary | ICD-10-CM | POA: Diagnosis not present

## 2020-06-27 DIAGNOSIS — D485 Neoplasm of uncertain behavior of skin: Secondary | ICD-10-CM | POA: Diagnosis not present

## 2020-06-27 DIAGNOSIS — R627 Adult failure to thrive: Secondary | ICD-10-CM | POA: Diagnosis not present

## 2020-06-27 DIAGNOSIS — E559 Vitamin D deficiency, unspecified: Secondary | ICD-10-CM | POA: Diagnosis not present

## 2020-06-27 DIAGNOSIS — I951 Orthostatic hypotension: Secondary | ICD-10-CM | POA: Diagnosis not present

## 2020-06-27 DIAGNOSIS — E785 Hyperlipidemia, unspecified: Secondary | ICD-10-CM | POA: Diagnosis not present

## 2020-06-27 DIAGNOSIS — K219 Gastro-esophageal reflux disease without esophagitis: Secondary | ICD-10-CM | POA: Diagnosis not present

## 2020-06-27 DIAGNOSIS — G901 Familial dysautonomia [Riley-Day]: Secondary | ICD-10-CM | POA: Diagnosis not present

## 2020-06-27 DIAGNOSIS — I10 Essential (primary) hypertension: Secondary | ICD-10-CM | POA: Diagnosis not present

## 2020-07-03 ENCOUNTER — Telehealth: Payer: Self-pay | Admitting: *Deleted

## 2020-07-03 DIAGNOSIS — R627 Adult failure to thrive: Secondary | ICD-10-CM | POA: Diagnosis not present

## 2020-07-03 DIAGNOSIS — I951 Orthostatic hypotension: Secondary | ICD-10-CM | POA: Diagnosis not present

## 2020-07-03 DIAGNOSIS — I10 Essential (primary) hypertension: Secondary | ICD-10-CM | POA: Diagnosis not present

## 2020-07-03 DIAGNOSIS — D485 Neoplasm of uncertain behavior of skin: Secondary | ICD-10-CM | POA: Diagnosis not present

## 2020-07-03 DIAGNOSIS — K219 Gastro-esophageal reflux disease without esophagitis: Secondary | ICD-10-CM | POA: Diagnosis not present

## 2020-07-03 DIAGNOSIS — F0150 Vascular dementia without behavioral disturbance: Secondary | ICD-10-CM | POA: Diagnosis not present

## 2020-07-03 DIAGNOSIS — E559 Vitamin D deficiency, unspecified: Secondary | ICD-10-CM | POA: Diagnosis not present

## 2020-07-03 DIAGNOSIS — G901 Familial dysautonomia [Riley-Day]: Secondary | ICD-10-CM | POA: Diagnosis not present

## 2020-07-03 DIAGNOSIS — E785 Hyperlipidemia, unspecified: Secondary | ICD-10-CM | POA: Diagnosis not present

## 2020-07-03 NOTE — Telephone Encounter (Signed)
Per Dr Melford Aase, [place] for speech therapy 1 time a week x 4 weeks. Therapist aware.

## 2020-07-04 ENCOUNTER — Telehealth: Payer: Self-pay | Admitting: *Deleted

## 2020-07-04 NOTE — Telephone Encounter (Signed)
[facility] therapist called and reported patient fainted at visit on 07/03/2020. Patient was drooling and urinated on himself. Family states this has happened several times, but refuse to take him to the ED. Dr Melford Aase is aware.

## 2020-07-08 DIAGNOSIS — K219 Gastro-esophageal reflux disease without esophagitis: Secondary | ICD-10-CM | POA: Diagnosis not present

## 2020-07-08 DIAGNOSIS — E559 Vitamin D deficiency, unspecified: Secondary | ICD-10-CM | POA: Diagnosis not present

## 2020-07-08 DIAGNOSIS — E785 Hyperlipidemia, unspecified: Secondary | ICD-10-CM | POA: Diagnosis not present

## 2020-07-08 DIAGNOSIS — R627 Adult failure to thrive: Secondary | ICD-10-CM | POA: Diagnosis not present

## 2020-07-08 DIAGNOSIS — G901 Familial dysautonomia [Riley-Day]: Secondary | ICD-10-CM | POA: Diagnosis not present

## 2020-07-08 DIAGNOSIS — D485 Neoplasm of uncertain behavior of skin: Secondary | ICD-10-CM | POA: Diagnosis not present

## 2020-07-08 DIAGNOSIS — I951 Orthostatic hypotension: Secondary | ICD-10-CM | POA: Diagnosis not present

## 2020-07-08 DIAGNOSIS — I10 Essential (primary) hypertension: Secondary | ICD-10-CM | POA: Diagnosis not present

## 2020-07-08 DIAGNOSIS — F0150 Vascular dementia without behavioral disturbance: Secondary | ICD-10-CM | POA: Diagnosis not present

## 2020-07-11 ENCOUNTER — Other Ambulatory Visit: Payer: Self-pay | Admitting: Internal Medicine

## 2020-07-11 MED ORDER — QUETIAPINE FUMARATE 50 MG PO TABS: ORAL_TABLET | ORAL | 1 refills | Status: DC

## 2020-07-15 ENCOUNTER — Emergency Department (HOSPITAL_COMMUNITY): Payer: Medicare HMO

## 2020-07-15 ENCOUNTER — Inpatient Hospital Stay (HOSPITAL_COMMUNITY): Payer: Medicare HMO

## 2020-07-15 ENCOUNTER — Encounter (HOSPITAL_COMMUNITY): Payer: Self-pay | Admitting: Emergency Medicine

## 2020-07-15 ENCOUNTER — Inpatient Hospital Stay (HOSPITAL_COMMUNITY)
Admission: EM | Admit: 2020-07-15 | Discharge: 2020-07-18 | DRG: 065 | Disposition: A | Discharge status: Skilled Nursing Facility | Payer: Medicare HMO | Attending: Family Medicine | Admitting: Family Medicine

## 2020-07-15 ENCOUNTER — Other Ambulatory Visit: Payer: Self-pay

## 2020-07-15 DIAGNOSIS — N182 Chronic kidney disease, stage 2 (mild): Secondary | ICD-10-CM | POA: Diagnosis present

## 2020-07-15 DIAGNOSIS — M542 Cervicalgia: Secondary | ICD-10-CM | POA: Diagnosis not present

## 2020-07-15 DIAGNOSIS — E876 Hypokalemia: Secondary | ICD-10-CM | POA: Diagnosis not present

## 2020-07-15 DIAGNOSIS — H905 Unspecified sensorineural hearing loss: Secondary | ICD-10-CM | POA: Diagnosis present

## 2020-07-15 DIAGNOSIS — Z7982 Long term (current) use of aspirin: Secondary | ICD-10-CM | POA: Diagnosis not present

## 2020-07-15 DIAGNOSIS — N319 Neuromuscular dysfunction of bladder, unspecified: Secondary | ICD-10-CM | POA: Diagnosis present

## 2020-07-15 DIAGNOSIS — Z515 Encounter for palliative care: Secondary | ICD-10-CM

## 2020-07-15 DIAGNOSIS — F0150 Vascular dementia without behavioral disturbance: Secondary | ICD-10-CM | POA: Diagnosis present

## 2020-07-15 DIAGNOSIS — R0902 Hypoxemia: Secondary | ICD-10-CM | POA: Diagnosis not present

## 2020-07-15 DIAGNOSIS — I6300 Cerebral infarction due to thrombosis of unspecified precerebral artery: Secondary | ICD-10-CM | POA: Diagnosis not present

## 2020-07-15 DIAGNOSIS — R531 Weakness: Secondary | ICD-10-CM | POA: Diagnosis not present

## 2020-07-15 DIAGNOSIS — I639 Cerebral infarction, unspecified: Secondary | ICD-10-CM | POA: Diagnosis present

## 2020-07-15 DIAGNOSIS — Z7189 Other specified counseling: Secondary | ICD-10-CM

## 2020-07-15 DIAGNOSIS — R55 Syncope and collapse: Secondary | ICD-10-CM | POA: Diagnosis not present

## 2020-07-15 DIAGNOSIS — R296 Repeated falls: Secondary | ICD-10-CM | POA: Diagnosis not present

## 2020-07-15 DIAGNOSIS — R7303 Prediabetes: Secondary | ICD-10-CM | POA: Diagnosis not present

## 2020-07-15 DIAGNOSIS — Z7401 Bed confinement status: Secondary | ICD-10-CM | POA: Diagnosis not present

## 2020-07-15 DIAGNOSIS — I7090 Unspecified atherosclerosis: Secondary | ICD-10-CM | POA: Diagnosis not present

## 2020-07-15 DIAGNOSIS — Z20822 Contact with and (suspected) exposure to covid-19: Secondary | ICD-10-CM | POA: Diagnosis not present

## 2020-07-15 DIAGNOSIS — M6281 Muscle weakness (generalized): Secondary | ICD-10-CM | POA: Diagnosis not present

## 2020-07-15 DIAGNOSIS — M2550 Pain in unspecified joint: Secondary | ICD-10-CM | POA: Diagnosis not present

## 2020-07-15 DIAGNOSIS — I951 Orthostatic hypotension: Secondary | ICD-10-CM | POA: Diagnosis present

## 2020-07-15 DIAGNOSIS — R410 Disorientation, unspecified: Secondary | ICD-10-CM | POA: Diagnosis not present

## 2020-07-15 DIAGNOSIS — R2689 Other abnormalities of gait and mobility: Secondary | ICD-10-CM | POA: Diagnosis not present

## 2020-07-15 DIAGNOSIS — I129 Hypertensive chronic kidney disease with stage 1 through stage 4 chronic kidney disease, or unspecified chronic kidney disease: Secondary | ICD-10-CM | POA: Diagnosis present

## 2020-07-15 DIAGNOSIS — I6340 Cerebral infarction due to embolism of unspecified cerebral artery: Secondary | ICD-10-CM | POA: Diagnosis not present

## 2020-07-15 DIAGNOSIS — R29702 NIHSS score 2: Secondary | ICD-10-CM | POA: Diagnosis present

## 2020-07-15 DIAGNOSIS — M4802 Spinal stenosis, cervical region: Secondary | ICD-10-CM | POA: Diagnosis present

## 2020-07-15 DIAGNOSIS — R29818 Other symptoms and signs involving the nervous system: Secondary | ICD-10-CM | POA: Diagnosis not present

## 2020-07-15 DIAGNOSIS — I10 Essential (primary) hypertension: Secondary | ICD-10-CM | POA: Diagnosis not present

## 2020-07-15 DIAGNOSIS — G8194 Hemiplegia, unspecified affecting left nondominant side: Secondary | ICD-10-CM | POA: Diagnosis present

## 2020-07-15 DIAGNOSIS — F0390 Unspecified dementia without behavioral disturbance: Secondary | ICD-10-CM

## 2020-07-15 DIAGNOSIS — I6389 Other cerebral infarction: Secondary | ICD-10-CM | POA: Diagnosis not present

## 2020-07-15 DIAGNOSIS — R278 Other lack of coordination: Secondary | ICD-10-CM | POA: Diagnosis not present

## 2020-07-15 DIAGNOSIS — E785 Hyperlipidemia, unspecified: Secondary | ICD-10-CM | POA: Diagnosis not present

## 2020-07-15 DIAGNOSIS — R2681 Unsteadiness on feet: Secondary | ICD-10-CM | POA: Diagnosis not present

## 2020-07-15 DIAGNOSIS — Z79899 Other long term (current) drug therapy: Secondary | ICD-10-CM

## 2020-07-15 DIAGNOSIS — N179 Acute kidney failure, unspecified: Secondary | ICD-10-CM | POA: Diagnosis present

## 2020-07-15 DIAGNOSIS — K219 Gastro-esophageal reflux disease without esophagitis: Secondary | ICD-10-CM | POA: Diagnosis present

## 2020-07-15 DIAGNOSIS — R41841 Cognitive communication deficit: Secondary | ICD-10-CM | POA: Diagnosis not present

## 2020-07-15 DIAGNOSIS — R4182 Altered mental status, unspecified: Secondary | ICD-10-CM | POA: Diagnosis not present

## 2020-07-15 DIAGNOSIS — E1169 Type 2 diabetes mellitus with other specified complication: Secondary | ICD-10-CM

## 2020-07-15 DIAGNOSIS — R0781 Pleurodynia: Secondary | ICD-10-CM | POA: Diagnosis not present

## 2020-07-15 DIAGNOSIS — R000 Tachycardia, unspecified: Secondary | ICD-10-CM | POA: Diagnosis not present

## 2020-07-15 DIAGNOSIS — R32 Unspecified urinary incontinence: Secondary | ICD-10-CM | POA: Diagnosis present

## 2020-07-15 DIAGNOSIS — M47812 Spondylosis without myelopathy or radiculopathy, cervical region: Secondary | ICD-10-CM | POA: Diagnosis not present

## 2020-07-15 DIAGNOSIS — I6782 Cerebral ischemia: Secondary | ICD-10-CM | POA: Diagnosis not present

## 2020-07-15 DIAGNOSIS — M79605 Pain in left leg: Secondary | ICD-10-CM | POA: Diagnosis not present

## 2020-07-15 DIAGNOSIS — R4020 Unspecified coma: Secondary | ICD-10-CM | POA: Diagnosis not present

## 2020-07-15 LAB — I-STAT CHEM 8, ED
BUN: 26 mg/dL — ABNORMAL HIGH (ref 8–23)
Calcium, Ion: 1.16 mmol/L (ref 1.15–1.40)
Chloride: 99 mmol/L (ref 98–111)
Creatinine, Ser: 1.5 mg/dL — ABNORMAL HIGH (ref 0.61–1.24)
Glucose, Bld: 132 mg/dL — ABNORMAL HIGH (ref 70–99)
HCT: 36 % — ABNORMAL LOW (ref 39.0–52.0)
Hemoglobin: 12.2 g/dL — ABNORMAL LOW (ref 13.0–17.0)
Potassium: 2.5 mmol/L — CL (ref 3.5–5.1)
Sodium: 145 mmol/L (ref 135–145)
TCO2: 30 mmol/L (ref 22–32)

## 2020-07-15 LAB — CBC
HCT: 39.1 % (ref 39.0–52.0)
Hemoglobin: 13.1 g/dL (ref 13.0–17.0)
MCH: 29.8 pg (ref 26.0–34.0)
MCHC: 33.5 g/dL (ref 30.0–36.0)
MCV: 88.9 fL (ref 80.0–100.0)
Platelets: 150 10*3/uL (ref 150–400)
RBC: 4.4 MIL/uL (ref 4.22–5.81)
RDW: 14.3 % (ref 11.5–15.5)
WBC: 8.6 10*3/uL (ref 4.0–10.5)
nRBC: 0 % (ref 0.0–0.2)

## 2020-07-15 LAB — MAGNESIUM: Magnesium: 2.8 mg/dL — ABNORMAL HIGH (ref 1.7–2.4)

## 2020-07-15 LAB — COMPREHENSIVE METABOLIC PANEL
ALT: 20 U/L (ref 0–44)
AST: 27 U/L (ref 15–41)
Albumin: 3.2 g/dL — ABNORMAL LOW (ref 3.5–5.0)
Alkaline Phosphatase: 81 U/L (ref 38–126)
Anion gap: 9 (ref 5–15)
BUN: 21 mg/dL (ref 8–23)
CO2: 31 mmol/L (ref 22–32)
Calcium: 8.6 mg/dL — ABNORMAL LOW (ref 8.9–10.3)
Chloride: 104 mmol/L (ref 98–111)
Creatinine, Ser: 1.59 mg/dL — ABNORMAL HIGH (ref 0.61–1.24)
GFR calc Af Amer: 48 mL/min — ABNORMAL LOW (ref >60)
GFR calc non Af Amer: 42 mL/min — ABNORMAL LOW (ref >60)
Glucose, Bld: 136 mg/dL — ABNORMAL HIGH (ref 70–99)
Potassium: 2.5 mmol/L — CL (ref 3.5–5.1)
Sodium: 144 mmol/L (ref 135–145)
Total Bilirubin: 1.1 mg/dL (ref 0.3–1.2)
Total Protein: 5.8 g/dL — ABNORMAL LOW (ref 6.5–8.1)

## 2020-07-15 LAB — RESPIRATORY PANEL BY RT PCR (FLU A&B, COVID)
Influenza A by PCR: NEGATIVE
Influenza B by PCR: NEGATIVE
SARS Coronavirus 2 by RT PCR: NEGATIVE

## 2020-07-15 LAB — URINALYSIS, ROUTINE W REFLEX MICROSCOPIC
Bacteria, UA: NONE SEEN
Bilirubin Urine: NEGATIVE
Glucose, UA: NEGATIVE mg/dL
Ketones, ur: NEGATIVE mg/dL
Leukocytes,Ua: NEGATIVE
Nitrite: NEGATIVE
Protein, ur: NEGATIVE mg/dL
Specific Gravity, Urine: 1.01 (ref 1.005–1.030)
pH: 7 (ref 5.0–8.0)

## 2020-07-15 LAB — DIFFERENTIAL
Abs Immature Granulocytes: 0.04 10*3/uL (ref 0.00–0.07)
Basophils Absolute: 0.1 10*3/uL (ref 0.0–0.1)
Basophils Relative: 1 %
Eosinophils Absolute: 0.4 10*3/uL (ref 0.0–0.5)
Eosinophils Relative: 5 %
Immature Granulocytes: 1 %
Lymphocytes Relative: 10 %
Lymphs Abs: 0.9 10*3/uL (ref 0.7–4.0)
Monocytes Absolute: 0.7 10*3/uL (ref 0.1–1.0)
Monocytes Relative: 8 %
Neutro Abs: 6.5 10*3/uL (ref 1.7–7.7)
Neutrophils Relative %: 75 %

## 2020-07-15 LAB — NA AND K (SODIUM & POTASSIUM), RAND UR
Potassium Urine: 36 mmol/L
Sodium, Ur: 72 mmol/L

## 2020-07-15 LAB — BASIC METABOLIC PANEL
Anion gap: 10 (ref 5–15)
BUN: 18 mg/dL (ref 8–23)
CO2: 31 mmol/L (ref 22–32)
Calcium: 8.5 mg/dL — ABNORMAL LOW (ref 8.9–10.3)
Chloride: 104 mmol/L (ref 98–111)
Creatinine, Ser: 1.35 mg/dL — ABNORMAL HIGH (ref 0.61–1.24)
GFR calc Af Amer: 59 mL/min — ABNORMAL LOW (ref >60)
GFR calc non Af Amer: 51 mL/min — ABNORMAL LOW (ref >60)
Glucose, Bld: 103 mg/dL — ABNORMAL HIGH (ref 70–99)
Potassium: 2.6 mmol/L — CL (ref 3.5–5.1)
Sodium: 145 mmol/L (ref 135–145)

## 2020-07-15 LAB — PROTIME-INR
INR: 1 (ref 0.8–1.2)
Prothrombin Time: 12.9 seconds (ref 11.4–15.2)

## 2020-07-15 LAB — CBG MONITORING, ED: Glucose-Capillary: 128 mg/dL — ABNORMAL HIGH (ref 70–99)

## 2020-07-15 LAB — APTT: aPTT: 31 seconds (ref 24–36)

## 2020-07-15 LAB — CREATININE, URINE, RANDOM: Creatinine, Urine: 51.98 mg/dL

## 2020-07-15 MED ORDER — QUETIAPINE FUMARATE 25 MG PO TABS
25.0000 mg | ORAL_TABLET | Freq: Every day | ORAL | Status: DC
  Administered 2020-07-16 – 2020-07-17 (×2): 25 mg via ORAL
  Filled 2020-07-15 (×3): qty 1

## 2020-07-15 MED ORDER — MIRABEGRON ER 25 MG PO TB24
50.0000 mg | ORAL_TABLET | Freq: Every day | ORAL | Status: DC
  Administered 2020-07-16 – 2020-07-18 (×3): 50 mg via ORAL
  Filled 2020-07-15: qty 1
  Filled 2020-07-15 (×2): qty 2

## 2020-07-15 MED ORDER — MAGNESIUM OXIDE 400 (241.3 MG) MG PO TABS
200.0000 mg | ORAL_TABLET | Freq: Every day | ORAL | Status: DC
  Administered 2020-07-16: 200 mg via ORAL
  Filled 2020-07-15: qty 1

## 2020-07-15 MED ORDER — HYDRALAZINE HCL 20 MG/ML IJ SOLN
5.0000 mg | INTRAMUSCULAR | Status: DC | PRN
  Administered 2020-07-15 – 2020-07-16 (×2): 5 mg via INTRAVENOUS
  Filled 2020-07-15 (×2): qty 1

## 2020-07-15 MED ORDER — POTASSIUM CHLORIDE CRYS ER 20 MEQ PO TBCR
40.0000 meq | EXTENDED_RELEASE_TABLET | Freq: Once | ORAL | Status: AC
  Administered 2020-07-16: 40 meq via ORAL
  Filled 2020-07-15: qty 2

## 2020-07-15 MED ORDER — VITAMIN D 25 MCG (1000 UNIT) PO TABS
5000.0000 [IU] | ORAL_TABLET | Freq: Every day | ORAL | Status: DC
  Administered 2020-07-16 – 2020-07-18 (×3): 5000 [IU] via ORAL
  Filled 2020-07-15 (×3): qty 5

## 2020-07-15 MED ORDER — STROKE: EARLY STAGES OF RECOVERY BOOK: Freq: Once | Status: DC

## 2020-07-15 MED ORDER — POTASSIUM CHLORIDE 10 MEQ/100ML IV SOLN
10.0000 meq | INTRAVENOUS | Status: AC
  Administered 2020-07-15 (×3): 10 meq via INTRAVENOUS
  Filled 2020-07-15 (×3): qty 100

## 2020-07-15 MED ORDER — VITAMIN B-12 1000 MCG PO TABS
1000.0000 ug | ORAL_TABLET | Freq: Every day | ORAL | Status: DC
  Administered 2020-07-16 – 2020-07-18 (×3): 1000 ug via ORAL
  Filled 2020-07-15 (×3): qty 1

## 2020-07-15 MED ORDER — MAGNESIUM SULFATE 2 GM/50ML IV SOLN
2.0000 g | Freq: Once | INTRAVENOUS | Status: AC
  Administered 2020-07-15: 2 g via INTRAVENOUS
  Filled 2020-07-15: qty 50

## 2020-07-15 MED ORDER — SODIUM CHLORIDE 0.9 % IV SOLN: INTRAVENOUS | Status: DC

## 2020-07-15 MED ORDER — ROSUVASTATIN CALCIUM 5 MG PO TABS: 5.0000 mg | ORAL_TABLET | Freq: Every day | ORAL | Status: DC

## 2020-07-15 MED ORDER — SENNOSIDES-DOCUSATE SODIUM 8.6-50 MG PO TABS
1.0000 | ORAL_TABLET | Freq: Every day | ORAL | Status: DC
  Administered 2020-07-16 – 2020-07-18 (×3): 1 via ORAL
  Filled 2020-07-15 (×4): qty 1

## 2020-07-15 MED ORDER — LORAZEPAM 0.5 MG PO TABS
0.5000 mg | ORAL_TABLET | Freq: Four times a day (QID) | ORAL | Status: AC | PRN
Start: 2020-07-15 — End: 2020-07-17
  Administered 2020-07-16 – 2020-07-17 (×2): 0.5 mg via ORAL
  Filled 2020-07-15 (×2): qty 1

## 2020-07-15 MED ORDER — SENNOSIDES-DOCUSATE SODIUM 8.6-50 MG PO TABS
1.0000 | ORAL_TABLET | Freq: Every evening | ORAL | Status: DC | PRN
  Administered 2020-07-15: 1 via ORAL

## 2020-07-15 MED ORDER — ASPIRIN EC 81 MG PO TBEC
81.0000 mg | DELAYED_RELEASE_TABLET | Freq: Every day | ORAL | Status: DC
  Administered 2020-07-15 – 2020-07-18 (×4): 81 mg via ORAL
  Filled 2020-07-15 (×4): qty 1

## 2020-07-15 MED ORDER — POTASSIUM CHLORIDE CRYS ER 20 MEQ PO TBCR
40.0000 meq | EXTENDED_RELEASE_TABLET | Freq: Once | ORAL | Status: AC
  Administered 2020-07-15: 40 meq via ORAL
  Filled 2020-07-15: qty 2

## 2020-07-15 MED ORDER — MIDODRINE HCL 5 MG PO TABS
10.0000 mg | ORAL_TABLET | Freq: Three times a day (TID) | ORAL | Status: DC
  Administered 2020-07-16: 10 mg via ORAL
  Filled 2020-07-15: qty 2

## 2020-07-15 MED ORDER — POTASSIUM CHLORIDE 10 MEQ/100ML IV SOLN
10.0000 meq | INTRAVENOUS | Status: AC
  Administered 2020-07-16 (×2): 10 meq via INTRAVENOUS
  Filled 2020-07-15 (×2): qty 100

## 2020-07-15 MED ORDER — POTASSIUM CHLORIDE CRYS ER 20 MEQ PO TBCR
20.0000 meq | EXTENDED_RELEASE_TABLET | Freq: Two times a day (BID) | ORAL | Status: DC
  Administered 2020-07-16 (×2): 20 meq via ORAL
  Filled 2020-07-15 (×2): qty 1

## 2020-07-15 MED ORDER — ENOXAPARIN SODIUM 40 MG/0.4ML ~~LOC~~ SOLN
40.0000 mg | SUBCUTANEOUS | Status: DC
  Administered 2020-07-16 – 2020-07-17 (×2): 40 mg via SUBCUTANEOUS
  Filled 2020-07-15 (×2): qty 0.4

## 2020-07-15 MED ORDER — FLUDROCORTISONE ACETATE 0.1 MG PO TABS
0.1000 mg | ORAL_TABLET | Freq: Two times a day (BID) | ORAL | Status: DC
  Administered 2020-07-16 (×2): 0.1 mg via ORAL
  Filled 2020-07-15 (×4): qty 1

## 2020-07-15 MED ORDER — MAGNESIUM 250 MG PO TABS: 250.0000 mg | ORAL_TABLET | Freq: Every day | ORAL | Status: DC

## 2020-07-15 MED ORDER — ROSUVASTATIN CALCIUM 20 MG PO TABS
20.0000 mg | ORAL_TABLET | Freq: Every day | ORAL | Status: DC
  Administered 2020-07-16 – 2020-07-18 (×3): 20 mg via ORAL
  Filled 2020-07-15 (×3): qty 1

## 2020-07-15 MED ORDER — PANTOPRAZOLE SODIUM 40 MG PO TBEC
40.0000 mg | DELAYED_RELEASE_TABLET | Freq: Every day | ORAL | Status: DC
  Administered 2020-07-15 – 2020-07-18 (×4): 40 mg via ORAL
  Filled 2020-07-15 (×4): qty 1

## 2020-07-15 NOTE — ED Triage Notes (Signed)
Pt arrives from home via [facility], wife reports that patients LSN was Sunday 6pm, pt had a fall on Sunday, bruising and swelling to L rib cage from fall. Pt has been having difficulty moving L arm and L leg since Sunday. Has L arm drift and dragging L leg with ambulation. Hx of syncopal episodes that have become more frequent recently. Pt had syncopal ep when moved to stair chair per EMS. EMS VS 174/112, HR 68, RR 20, O298%. No facial droop present. A/ox2 at baseline. Pt arrives to triage, staring, unresponsive (seizures). Pt had recent covid booster shot which he had a fever from yesterday. Hx of dementia.

## 2020-07-15 NOTE — H&P (Signed)
History and Physical    Mathew Miller. DQQ:[id number] DOB: [birth year] DOA: 07/15/2020  PCP: Unk Pinto, MD (Confirm with patient/family/NH records and if not entered, this has to be entered at [facility] point of entry) Patient coming from: Home  I have personally briefly reviewed patient's old medical records in [place]  Chief Complaint: Left sided weakness  HPI: Mathew Miller. is a 74 y.o. male with medical history significant of orthostatic hypotension, frequent recurrent near syncope and syncope, vascular dementia, HTN, HLD, CKD stage II, chronic hypokalemia on supplement, prediabetes, presented with persistent left-sided weakness.  Symptoms started 2 days ago, on Sunday evening patient suddenly felt left-sided weakness more on the left arm.  Over last 2 days, the weakness has not improved on the left arm.  This morning patient had another near syncope episode, he felt lightheaded and then fell down to the left side.  Denied any head or neck injury.  No loss of consciousness and no loss control of urine or BM.  Wife checked his blood pressure right after the episode found his blood pressure in the 90s, and a similar pattern of low blood pressure was repeatedly found by the wife after each episode.  Denies any headache, no blurry vision no numbness or tingling of any of the limbs. ED Course: CT head, no acute findings.  CT neck multilevel degenerative changes.  Potassium 2.5.  Creatinine 1.5 compared to baseline around 2.  Review of Systems: As per HPI otherwise 14 point review of systems negative.    Past Medical History:  Diagnosis Date  . Constipation   . Hyperlipidemia   . Hypertension   . Prediabetes    no meds   . Vitamin D deficiency     Past Surgical History:  Procedure Laterality Date  . CATARACT EXTRACTION, BILATERAL  2017  . COLONOSCOPY    . NO PAST SURGERIES       reports that he has never smoked. He has never used smokeless tobacco. He reports  current alcohol use. He reports that he does not use drugs.  Allergies  Allergen Reactions  . Hydrocodone-Acetaminophen Nausea Only  . Sudafed [Pseudoephedrine Hcl]     Urinary retention  . Tetanus Toxoids     Itching, burning feeling, ran a fever    Family History  Problem Relation Age of Onset  . Hyperlipidemia Mother   . Glaucoma Mother   . Heart disease Father   . Hypertension Father   . Colon cancer Neg Hx   . Esophageal cancer Neg Hx   . Pancreatic cancer Neg Hx   . Prostate cancer Neg Hx   . Rectal cancer Neg Hx   . Stomach cancer Neg Hx      Prior to Admission medications   Medication Sig Start Date End Date Taking? Authorizing Provider  rosuvastatin (CRESTOR) 5 MG tablet Take 1 tablet Daily for Cholesterol Patient taking differently: Take 5 mg by mouth daily.  11/23/19  Yes Unk Pinto, MD  Accu-Chek Softclix Lancets lancets Check blood sugar 1 time a day.[id number]. Patient taking differently: 1 each by Other route See admin instructions. Check blood sugar 1 time a day.[id number]. 12/17/19   Unk Pinto, MD  Alcohol Swabs (B-D SINGLE USE SWABS REGULAR) PADS Check blood sugar 1 time daily-DX-Z87.898. Patient taking differently: 1 each by Other route See admin instructions. Check blood sugar 1 time daily-[id number]. 12/17/19   Unk Pinto, MD  aspirin EC 81 MG tablet Take 81 mg by mouth  daily. Swallow whole.    [provider]  Blood Glucose Monitoring Suppl (ACCU-CHEK AVIVA PLUS) w/Device KIT USE AS DIRECTED Patient taking differently: 1 each by Other route as directed.  12/20/19   Liane Comber, NP  fludrocortisone (FLORINEF) 0.1 MG tablet Take 2 tablets 3 x/day  to maintain Standing BP over 100 Patient taking differently: Take 0.1 mg by mouth 2 (two) times daily.  01/23/20   Unk Pinto, MD  glucose blood (ACCU-CHEK AVIVA PLUS) test strip Check blood sugar 1 time a day-[id number] Patient taking differently: 1 each by Other route See admin  instructions. Check blood sugar 1 time a day-[id number] 12/17/19   Unk Pinto, MD  Magnesium 250 MG TABS Take 250 mg by mouth daily.    [provider]  midodrine (PROAMATINE) 10 MG tablet Take 1 tablet 3 x /day for Low BP Patient taking differently: Take 10 mg by mouth 3 (three) times daily.  12/17/19   Unk Pinto, MD  mirabegron ER (MYRBETRIQ) 50 MG TB24 tablet Take 1 tablet Daily for Bladder Patient taking differently: Take 50 mg by mouth daily.  01/09/20   Unk Pinto, MD  omeprazole (PRILOSEC) 20 MG capsule Take 1 capsule   2 x   /day   to Prevent Heartburn & Indigestion Patient not taking: Reported on 05/22/2020 05/18/20   Unk Pinto, MD  potassium chloride SA (KLOR-CON) 20 MEQ tablet Take 1 tablet 2 x /day for Potassium Patient taking differently: Take 20 mEq by mouth 2 (two) times daily.  12/17/19   Unk Pinto, MD  QUEtiapine (SEROQUEL) 50 MG tablet Take 1 to 2 tablets in late Afternoon for "Sundowning" 07/11/20   Unk Pinto, MD  senna-docusate (GNP STOOL SOFTENER/LAXATIVE) 8.6-50 MG tablet Take 1 tablet Daily for Constipation Patient taking differently: Take 1 tablet by mouth daily.  10/20/19   Unk Pinto, MD  vitamin B-12 (CYANOCOBALAMIN) 500 MCG tablet Dissolve 1 tablet under tongue daily 06/29/18   Unk Pinto, MD  VITAMIN D PO Take 5,000 Units by mouth daily.     [provider]    Physical Exam: Vitals:   07/15/20 1540 07/15/20 1600 07/15/20 1645 07/15/20 1733  BP: (!) 201/95 (!) 181/94  (!) 207/96  Pulse: 64 63 66 72  Resp: 11 17 15 16   Temp:      TempSrc:      SpO2: 97% 96% 98% 98%  Weight:      Height:        Constitutional: NAD, calm, comfortable Vitals:   07/15/20 1540 07/15/20 1600 07/15/20 1645 07/15/20 1733  BP: (!) 201/95 (!) 181/94  (!) 207/96  Pulse: 64 63 66 72  Resp: 11 17 15 16   Temp:      TempSrc:      SpO2: 97% 96% 98% 98%  Weight:      Height:       Eyes: PERRL, lids and conjunctivae  normal ENMT: Mucous membranes are moist. Posterior pharynx clear of any exudate or lesions.Normal dentition.  Neck: normal, supple, no masses, no thyromegaly Respiratory: clear to auscultation bilaterally, no wheezing, no crackles. Normal respiratory effort. No accessory muscle use.  Cardiovascular: Regular rate and rhythm, no murmurs / rubs / gallops. No extremity edema. 2+ pedal pulses. No carotid bruits.  Abdomen: no tenderness, no masses palpated. No hepatosplenomegaly. Bowel sounds positive.  Musculoskeletal: no clubbing / cyanosis. No joint deformity upper and lower extremities. Good ROM, no contractures. Normal muscle tone.  Significant muscle wasting on bilateral hands Skin: no  rashes, lesions, ulcers. No induration Neurologic: CN 2-12 grossly intact. Sensation intact, DTR normal. Strength 4/5 on left arm wrist and fingers compared to 5/5 on the right side, bilateral lower extremity muscle strength 5/5 symmetrical.  Psychiatric: Normal judgment and insight. Alert and oriented x 3. Normal mood.     Labs on Admission: I have personally reviewed following labs and imaging studies  CBC: Recent Labs  Lab 07/15/20 1316 07/15/20 1327  WBC 8.6  --   NEUTROABS 6.5  --   HGB 13.1 12.2*  HCT 39.1 36.0*  MCV 88.9  --   PLT 150  --    Basic Metabolic Panel: Recent Labs  Lab 07/15/20 1316 07/15/20 1327  NA 144 145  K 2.5* 2.5*  CL 104 99  CO2 31  --   GLUCOSE 136* 132*  BUN 21 26*  CREATININE 1.59* 1.50*  CALCIUM 8.6*  --    GFR: Estimated Creatinine Clearance: 46.2 mL/min (A) (by C-G formula based on SCr of 1.5 mg/dL (H)). Liver Function Tests: Recent Labs  Lab 07/15/20 1316  AST 27  ALT 20  ALKPHOS 81  BILITOT 1.1  PROT 5.8*  ALBUMIN 3.2*   No results for input(s): LIPASE, AMYLASE in the last 168 hours. No results for input(s): AMMONIA in the last 168 hours. Coagulation Profile: Recent Labs  Lab 07/15/20 1316  INR 1.0   Cardiac Enzymes: No results for  input(s): CKTOTAL, CKMB, CKMBINDEX, TROPONINI in the last 168 hours. BNP (last 3 results) No results for input(s): PROBNP in the last 8760 hours. HbA1C: No results for input(s): HGBA1C in the last 72 hours. CBG: Recent Labs  Lab 07/15/20 1317  GLUCAP 128*   Lipid Profile: No results for input(s): CHOL, HDL, LDLCALC, TRIG, CHOLHDL, LDLDIRECT in the last 72 hours. Thyroid Function Tests: No results for input(s): TSH, T4TOTAL, FREET4, T3FREE, THYROIDAB in the last 72 hours. Anemia Panel: No results for input(s): VITAMINB12, FOLATE, FERRITIN, TIBC, IRON, RETICCTPCT in the last 72 hours. Urine analysis:    Component Value Date/Time   COLORURINE YELLOW 05/22/2020 1950   APPEARANCEUR CLEAR 05/22/2020 1950   LABSPEC 1.012 05/22/2020 1950   PHURINE 7.0 05/22/2020 1950   GLUCOSEU 50 (A) 05/22/2020 1950   HGBUR SMALL (A) 05/22/2020 1950   BILIRUBINUR NEGATIVE 05/22/2020 [place] NEGATIVE 05/22/2020 1950   PROTEINUR NEGATIVE 05/22/2020 1950   UROBILINOGEN 1 06/12/2014 1156   NITRITE NEGATIVE 05/22/2020 1950   LEUKOCYTESUR NEGATIVE 05/22/2020 1950    Radiological Exams on Admission: DG Ribs Unilateral W/Chest Left  Result Date: 07/15/2020 CLINICAL DATA:  Left rib pain after fall. EXAM: LEFT RIBS AND CHEST - 3+ VIEW COMPARISON:  May 22, 2020. FINDINGS: No fracture or other bone lesions are seen involving the ribs. There is no evidence of pneumothorax or pleural effusion. Both lungs are clear. Heart size and mediastinal contours are within normal limits. IMPRESSION: Negative. Electronically Signed   By: Marijo Conception M.D.   On: 07/15/2020 14:35   DG Tibia/Fibula Left  Result Date: 07/15/2020 CLINICAL DATA:  Left leg pain after fall. EXAM: LEFT TIBIA AND FIBULA - 2 VIEW COMPARISON:  None. FINDINGS: There is no evidence of fracture or other focal bone lesions. Soft tissues are unremarkable. IMPRESSION: Negative. Electronically Signed   By: Marijo Conception M.D.   On: 07/15/2020  14:38   CT HEAD WO CONTRAST  Result Date: 07/15/2020 CLINICAL DATA:  Neuro deficit, stroke suspected. EXAM: CT HEAD WITHOUT CONTRAST CT CERVICAL SPINE WITHOUT  CONTRAST TECHNIQUE: Multidetector CT imaging of the head and cervical spine was performed following the standard protocol without intravenous contrast. Multiplanar CT image reconstructions of the cervical spine were also generated. COMPARISON:  None. FINDINGS: CT HEAD FINDINGS Brain: No evidence of acute large vascular territory infarction, hemorrhage, hydrocephalus, extra-axial collection or mass lesion/mass effect. Similar appearance of a remote infarct in the right frontal white matter. Similar additional patchy white matter hypoattenuation, compatible with chronic microvascular ischemic disease. Vascular: Calcific intracranial atherosclerosis. Skull: Normal. Negative for fracture or focal lesion. Sinuses/Orbits: No acute finding. Other: No mastoid effusion. CT CERVICAL SPINE FINDINGS Alignment: Exaggerated cervical lordosis, likely positional. Mild anterolisthesis of C4 on C5, likely degenerative given facet arthropathy at this level. Skull base and vertebrae: Vertebral body heights are maintained. No evidence of acute fracture. Soft tissues and spinal canal: No prevertebral fluid or swelling. No visible canal hematoma. Disc levels: Multilevel degenerative disc disease, greatest at C5-C6 and C6-C7. No evidence of advanced bony canal stenosis. Bulky multilevel facet hypertrophy. Upper chest: Mild paraseptal emphysema without consolidation. Other: Vascular calcifications. IMPRESSION: 1. No evidence of acute fracture or traumatic malalignment. 2. Multilevel degenerative disc disease, greatest at C5-C6 and C6-C7. Electronically Signed   By: Margaretha Sheffield MD   On: 07/15/2020 16:50   CT Cervical Spine Wo Contrast  Result Date: 07/15/2020 CLINICAL DATA:  Neuro deficit, stroke suspected. EXAM: CT HEAD WITHOUT CONTRAST CT CERVICAL SPINE WITHOUT CONTRAST  TECHNIQUE: Multidetector CT imaging of the head and cervical spine was performed following the standard protocol without intravenous contrast. Multiplanar CT image reconstructions of the cervical spine were also generated. COMPARISON:  None. FINDINGS: CT HEAD FINDINGS Brain: No evidence of acute large vascular territory infarction, hemorrhage, hydrocephalus, extra-axial collection or mass lesion/mass effect. Similar appearance of a remote infarct in the right frontal white matter. Similar additional patchy white matter hypoattenuation, compatible with chronic microvascular ischemic disease. Vascular: Calcific intracranial atherosclerosis. Skull: Normal. Negative for fracture or focal lesion. Sinuses/Orbits: No acute finding. Other: No mastoid effusion. CT CERVICAL SPINE FINDINGS Alignment: Exaggerated cervical lordosis, likely positional. Mild anterolisthesis of C4 on C5, likely degenerative given facet arthropathy at this level. Skull base and vertebrae: Vertebral body heights are maintained. No evidence of acute fracture. Soft tissues and spinal canal: No prevertebral fluid or swelling. No visible canal hematoma. Disc levels: Multilevel degenerative disc disease, greatest at C5-C6 and C6-C7. No evidence of advanced bony canal stenosis. Bulky multilevel facet hypertrophy. Upper chest: Mild paraseptal emphysema without consolidation. Other: Vascular calcifications. IMPRESSION: 1. No evidence of acute fracture or traumatic malalignment. 2. Multilevel degenerative disc disease, greatest at C5-C6 and C6-C7. Electronically Signed   By: Margaretha Sheffield MD   On: 07/15/2020 16:50    EKG: Independently reviewed.  LVH and prolonged QTC  Assessment/Plan Active Problems:   CVA (cerebral vascular accident) ([place])  (please populate well all problems here in Problem List. (For example, if patient is on BP meds at home and you resume or decide to hold them, it is a problem that needs to be her. Same for CAD, COPD, HLD and  so on)  Left arm paresis -CVA suspected, MRI and MRA -Continue aspirin, increased statin dosage -PT OT evaluation  Orthostatic hypotension and frequent falls -Continue midodrine and fludrocortisone, no Hx of Parkinsonian, and physical exam showed no muscle rigidity and wife report the patient does have unsteady gait, which will be evaluated by PT tomorrow. -Patient did not tolerated compression stocking in the past  Hypokalemia, acute on chronic -Wife report  this is a chronic problem at least 3 to 4 years, has been on supplement -We will check urine potassium level -Also send a UA  Uncontrolled hypertension -Might be rebound hypertension from earlier hypotension which caused near syncope and fall. -As needed hydralazine -May need outpatient ambulatory blood pressure monitoring  History of vascular dementia -With symptoms of sundowning, continue Seroquel  CKD stage II -Creatinine level stable, euvolemia   DVT prophylaxis: Lovenox Code Status: Full code Family Communication: Wife at bedside Disposition Plan: Patient has complicated medical problems with CVA, orthostatic hypotension with periodic hypertension, and severe hypokalemia, expect more than 2 midnight hospital stay  consults called: Neuro Admission status: Telemetry admission   Lequita Halt MD [facility] Pager [phone]  07/15/2020, 6:25 PM

## 2020-07-15 NOTE — ED Notes (Signed)
Pt transported to MRI 

## 2020-07-15 NOTE — ED Provider Notes (Signed)
   H/o autonomic dysfunction and syncopal episodes, had recent syncopal episode with fall 2 d ago and now w/ L arm numbness/weakness, subtle L leg weakness.. Unable to ambulate due to leg weakness. Getting oral and IV K for K 2.5.   At time of signout, pending head and c-spine CT and admission.  CTs negative for acute process. Discussed w/ neurology, Dr. Theda Sers, and their team will see pt in consultation.  Discussed admission with [facility], Dr. Roosevelt Locks.   Chanteria Haggard, Wenda Overland, MD 07/15/20 [phone]

## 2020-07-15 NOTE — ED Provider Notes (Signed)
[facility]PART[facility] Provider Note   CSN: [id number] Arrival date & time: 07/15/20  1252     History Chief Complaint  Patient presents with  . Stroke Symptoms    Mathew Heinle. is a 74 y.o. male.  HPI 74 year old male presents with left-sided weakness.  History is primarily from the wife.  The patient fell on 9/26 from a syncopal event.  He has been having syncope for a while.  Did not seem to hit his head.  Since then has had left-sided weakness and has been unable to ambulate due to his legs seeming to give out.  No obvious fevers or other infectious symptoms.  He hit his chest and has left-sided rib pain and has a left leg bruise but no real pain.   Past Medical History:  Diagnosis Date  . Constipation   . Hyperlipidemia   . Hypertension   . Prediabetes    no meds   . Vitamin D deficiency     Patient Active Problem List   Diagnosis Date Noted  . B12 deficiency 03/26/2020  . Unstable gait 10/01/2019  . Frequent falls 10/01/2019  . Imbalance 02/27/2018  . BMI 24.0-24.9, adult 02/26/2018  . Acid reflux 02/26/2018  . Memory changes 02/26/2018  . Vitamin D deficiency 03/01/2014  . Medication management 03/01/2014  . Hyperlipidemia   . History of prediabetes   . HTN (hypertension) 10/09/2013    Past Surgical History:  Procedure Laterality Date  . CATARACT EXTRACTION, BILATERAL  2017  . COLONOSCOPY    . NO PAST SURGERIES         Family History  Problem Relation Age of Onset  . Hyperlipidemia Mother   . Glaucoma Mother   . Heart disease Father   . Hypertension Father   . Colon cancer Neg Hx   . Esophageal cancer Neg Hx   . Pancreatic cancer Neg Hx   . Prostate cancer Neg Hx   . Rectal cancer Neg Hx   . Stomach cancer Neg Hx     Social History   Tobacco Use  . Smoking status: Never Smoker  . Smokeless tobacco: Never Used  Vaping Use  . Vaping Use: Never used  Substance Use Topics  . Alcohol use: Yes     Alcohol/week: 0.0 standard drinks    Comment: very Rare /Beer.wine.  . Drug use: No    Home Medications Prior to Admission medications   Medication Sig Start Date End Date Taking? Authorizing Provider  Accu-Chek Softclix Lancets lancets Check blood sugar 1 time a day.OM-V67.209. Patient taking differently: 1 each by Other route See admin instructions. Check blood sugar 1 time a day.OB-S96.283. 12/17/19   Unk Pinto, MD  Alcohol Swabs (B-D SINGLE USE SWABS REGULAR) PADS Check blood sugar 1 time daily-DX-Z87.898. Patient taking differently: 1 each by Other route See admin instructions. Check blood sugar 1 time daily-DX-Z87.898. 12/17/19   Unk Pinto, MD  aspirin EC 81 MG tablet Take 81 mg by mouth daily. Swallow whole.    [provider]  Blood Glucose Monitoring Suppl (ACCU-CHEK AVIVA PLUS) w/Device KIT USE AS DIRECTED Patient taking differently: 1 each by Other route as directed.  12/20/19   Liane Comber, NP  fludrocortisone (FLORINEF) 0.1 MG tablet Take 2 tablets 3 x/day  to maintain Standing BP over 100 Patient taking differently: Take 0.1 mg by mouth 2 (two) times daily.  01/23/20   Unk Pinto, MD  glucose blood (ACCU-CHEK AVIVA PLUS) test strip Check  blood sugar 1 time a day-DX-Z87.898 Patient taking differently: 1 each by Other route See admin instructions. Check blood sugar 1 time a day-DX-Z87.898 12/17/19   Unk Pinto, MD  Magnesium 250 MG TABS Take 250 mg by mouth daily.    [provider]  midodrine (PROAMATINE) 10 MG tablet Take 1 tablet 3 x /day for Low BP Patient taking differently: Take 10 mg by mouth 3 (three) times daily.  12/17/19   Unk Pinto, MD  mirabegron ER (MYRBETRIQ) 50 MG TB24 tablet Take 1 tablet Daily for Bladder Patient taking differently: Take 50 mg by mouth daily.  01/09/20   Unk Pinto, MD  omeprazole (PRILOSEC) 20 MG capsule Take 1 capsule   2 x   /day   to Prevent Heartburn & Indigestion Patient not taking: Reported  on 05/22/2020 05/18/20   Unk Pinto, MD  potassium chloride SA (KLOR-CON) 20 MEQ tablet Take 1 tablet 2 x /day for Potassium Patient taking differently: Take 20 mEq by mouth 2 (two) times daily.  12/17/19   Unk Pinto, MD  QUEtiapine (SEROQUEL) 50 MG tablet Take 1 to 2 tablets in late Afternoon for "Sundowning" 07/11/20   Unk Pinto, MD  rosuvastatin (CRESTOR) 5 MG tablet Take 1 tablet Daily for Cholesterol Patient taking differently: Take 5 mg by mouth daily.  11/23/19   Unk Pinto, MD  senna-docusate (GNP STOOL SOFTENER/LAXATIVE) 8.6-50 MG tablet Take 1 tablet Daily for Constipation Patient taking differently: Take 1 tablet by mouth daily.  10/20/19   Unk Pinto, MD  vitamin B-12 (CYANOCOBALAMIN) 500 MCG tablet Dissolve 1 tablet under tongue daily 06/29/18   Unk Pinto, MD  VITAMIN D PO Take 5,000 Units by mouth daily.     [provider]    Allergies    Hydrocodone-acetaminophen, Sudafed [pseudoephedrine hcl], and Tetanus toxoids  Review of Systems   Review of Systems  Constitutional: Negative for fever.  Cardiovascular: Positive for chest pain.  Musculoskeletal: Positive for neck pain.  Neurological: Positive for weakness. Negative for headaches.  All other systems reviewed and are negative.   Physical Exam Updated Vital Signs BP (!) 167/95   Pulse 63   Temp 98.9 F (37.2 C) (Oral)   Resp 13   Ht $R'6\' 2"'Ei$  (1.88 m)   Wt 78 kg   SpO2 99%   BMI 22.08 kg/m   Physical Exam Vitals and nursing note reviewed.  Constitutional:      General: He is not in acute distress.    Appearance: He is well-developed. He is not ill-appearing or diaphoretic.  HENT:     Head: Normocephalic and atraumatic.     Right Ear: External ear normal.     Left Ear: External ear normal.     Nose: Nose normal.  Eyes:     General:        Right eye: No discharge.        Left eye: No discharge.     Extraocular Movements: Extraocular movements intact.     Pupils: Pupils  are equal, round, and reactive to light.  Cardiovascular:     Rate and Rhythm: Normal rate and regular rhythm.     Heart sounds: Normal heart sounds.  Pulmonary:     Effort: Pulmonary effort is normal.     Breath sounds: Normal breath sounds.  Chest:     Chest wall: Tenderness (left lower chest wall with bruising) present.  Abdominal:     Palpations: Abdomen is soft.     Tenderness: There is  no abdominal tenderness.  Musculoskeletal:     Cervical back: Neck supple.     Comments: Ecchymosis over proximal tib/fib without tenderness  Skin:    General: Skin is warm and dry.  Neurological:     Mental Status: He is alert.     Comments: Patient has mild left facial droop.  He has significant weakness to the left upper extremity with loss of sensation.  Left leg is slightly weaker than the right but has intact sensation.  Normal strength in the right upper and lower extremities.  Psychiatric:        Mood and Affect: Mood is not anxious.     ED Results / Procedures / Treatments   Labs (all labs ordered are listed, but only abnormal results are displayed) Labs Reviewed  COMPREHENSIVE METABOLIC PANEL - Abnormal; Notable for the following components:      Result Value   Potassium 2.5 (*)    Glucose, Bld 136 (*)    Creatinine, Ser 1.59 (*)    Calcium 8.6 (*)    Total Protein 5.8 (*)    Albumin 3.2 (*)    GFR calc non Af Amer 42 (*)    GFR calc Af Amer 48 (*)    All other components within normal limits  I-STAT CHEM 8, ED - Abnormal; Notable for the following components:   Potassium 2.5 (*)    BUN 26 (*)    Creatinine, Ser 1.50 (*)    Glucose, Bld 132 (*)    Hemoglobin 12.2 (*)    HCT 36.0 (*)    All other components within normal limits  CBG MONITORING, ED - Abnormal; Notable for the following components:   Glucose-Capillary 128 (*)    All other components within normal limits  RESPIRATORY PANEL BY RT PCR (FLU A&B, COVID)  PROTIME-INR  APTT  CBC  DIFFERENTIAL  MAGNESIUM     EKG EKG Interpretation  Date/Time:  Tuesday July 15 2020 12:59:19 EDT Ventricular Rate:  81 PR Interval:  126 QRS Duration: 88 QT Interval:  420 QTC Calculation: 487 R Axis:   -14 Text Interpretation: Unusual P axis, possible ectopic atrial rhythm Left ventricular hypertrophy with repolarization abnormality ( R in aVL , Cornell product , Romhilt-Estes ) Prolonged QT Abnormal ECG Confirmed by Sherwood Gambler [phone]) on 07/15/2020 1:12:51 PM   Radiology DG Ribs Unilateral W/Chest Left  Result Date: 07/15/2020 CLINICAL DATA:  Left rib pain after fall. EXAM: LEFT RIBS AND CHEST - 3+ VIEW COMPARISON:  May 22, 2020. FINDINGS: No fracture or other bone lesions are seen involving the ribs. There is no evidence of pneumothorax or pleural effusion. Both lungs are clear. Heart size and mediastinal contours are within normal limits. IMPRESSION: Negative. Electronically Signed   By: Marijo Conception M.D.   On: 07/15/2020 14:35   DG Tibia/Fibula Left  Result Date: 07/15/2020 CLINICAL DATA:  Left leg pain after fall. EXAM: LEFT TIBIA AND FIBULA - 2 VIEW COMPARISON:  None. FINDINGS: There is no evidence of fracture or other focal bone lesions. Soft tissues are unremarkable. IMPRESSION: Negative. Electronically Signed   By: Marijo Conception M.D.   On: 07/15/2020 14:38    Procedures .Critical Care Performed by: Sherwood Gambler, MD Authorized by: Sherwood Gambler, MD   Critical care provider statement:    Critical care time (minutes):  30   Critical care time was exclusive of:  Separately billable procedures and treating other patients   Critical care was necessary to treat or  prevent imminent or life-threatening deterioration of the following conditions:  Metabolic crisis and CNS failure or compromise   Critical care was time spent personally by me on the following activities:  Discussions with consultants, evaluation of patient's response to treatment, examination of patient, ordering and  performing treatments and interventions, ordering and review of laboratory studies, ordering and review of radiographic studies, pulse oximetry, re-evaluation of patient's condition, obtaining history from patient or surrogate and review of old charts   (including critical care time)  Medications Ordered in ED Medications  magnesium sulfate IVPB 2 g 50 mL (2 g Intravenous New Bag/Given 07/15/20 1438)  potassium chloride 10 mEq in 100 mL IVPB (10 mEq Intravenous New Bag/Given 07/15/20 1436)  potassium chloride SA (KLOR-CON) CR tablet 40 mEq (40 mEq Oral Given 07/15/20 1442)    ED Course  I have reviewed the triage vital signs and the nursing notes.  Pertinent labs & imaging results that were available during my care of the patient were reviewed by me and considered in my medical decision making (see chart for details).    MDM Rules/Calculators/A&P                          Patient's presentation is concerning for subacute stroke.  Will need head CT to help rule out subdural hematoma or other head bleed.  He also has significant hypokalemia which could contribute to some weakness as well.  Head and C-spine CT are currently pending and care was transferred to Dr. Rex Kras. He will need admission afterwards. Given IV and PO potassium. Final Clinical Impression(s) / ED Diagnoses Final diagnoses:  Hypokalemia    Rx / DC Orders ED Discharge Orders    None       Sherwood Gambler, MD 07/15/20 1529

## 2020-07-15 NOTE — ED Notes (Signed)
Pt in Xray/CT unable to start medications.

## 2020-07-15 NOTE — ED Notes (Signed)
Dr. Rex Kras aware of HTN

## 2020-07-16 ENCOUNTER — Encounter (HOSPITAL_COMMUNITY): Payer: Self-pay | Admitting: Internal Medicine

## 2020-07-16 ENCOUNTER — Inpatient Hospital Stay (HOSPITAL_COMMUNITY): Payer: Medicare HMO

## 2020-07-16 DIAGNOSIS — I6389 Other cerebral infarction: Secondary | ICD-10-CM

## 2020-07-16 LAB — BASIC METABOLIC PANEL
Anion gap: 5 (ref 5–15)
Anion gap: 11 (ref 5–15)
BUN: 12 mg/dL (ref 8–23)
BUN: 16 mg/dL (ref 8–23)
CO2: 19 mmol/L — ABNORMAL LOW (ref 22–32)
CO2: 24 mmol/L (ref 22–32)
Calcium: 5.6 mg/dL — CL (ref 8.9–10.3)
Calcium: 8.9 mg/dL (ref 8.9–10.3)
Chloride: 120 mmol/L — ABNORMAL HIGH (ref 98–111)
Chloride: 108 mmol/L (ref 98–111)
Creatinine, Ser: 0.83 mg/dL (ref 0.61–1.24)
Creatinine, Ser: 1.34 mg/dL — ABNORMAL HIGH (ref 0.61–1.24)
GFR calc Af Amer: >60 mL/min (ref >60)
GFR calc Af Amer: 59 mL/min — ABNORMAL LOW (ref >60)
GFR calc non Af Amer: >60 mL/min (ref >60)
GFR calc non Af Amer: 51 mL/min — ABNORMAL LOW (ref >60)
Glucose, Bld: 101 mg/dL — ABNORMAL HIGH (ref 70–99)
Glucose, Bld: 168 mg/dL — ABNORMAL HIGH (ref 70–99)
Potassium: 2.9 mmol/L — ABNORMAL LOW (ref 3.5–5.1)
Potassium: 3.1 mmol/L — ABNORMAL LOW (ref 3.5–5.1)
Sodium: 144 mmol/L (ref 135–145)
Sodium: 143 mmol/L (ref 135–145)

## 2020-07-16 LAB — HEMOGLOBIN A1C
Hgb A1c MFr Bld: 4.9 % (ref 4.8–5.6)
Mean Plasma Glucose: 93.93 mg/dL

## 2020-07-16 LAB — ECHOCARDIOGRAM COMPLETE
Area-P 1/2: 3.65 cm2
Height: 74 in
S' Lateral: 3 cm
Weight: 2752 oz

## 2020-07-16 LAB — LIPID PANEL
Cholesterol: 130 mg/dL (ref 0–200)
HDL: 48 mg/dL (ref >40)
LDL Cholesterol: 69 mg/dL (ref 0–99)
Total CHOL/HDL Ratio: 2.7 RATIO
Triglycerides: 65 mg/dL (ref <150)
VLDL: 13 mg/dL (ref 0–40)

## 2020-07-16 MED ORDER — POTASSIUM CHLORIDE 10 MEQ/100ML IV SOLN
10.0000 meq | INTRAVENOUS | Status: DC
  Administered 2020-07-16: 10 meq via INTRAVENOUS
  Filled 2020-07-16: qty 100

## 2020-07-16 MED ORDER — HYDRALAZINE HCL 20 MG/ML IJ SOLN
10.0000 mg | Freq: Once | INTRAMUSCULAR | Status: AC
  Administered 2020-07-16: 10 mg via INTRAVENOUS
  Filled 2020-07-16: qty 1

## 2020-07-16 MED ORDER — POTASSIUM CHLORIDE 10 MEQ/100ML IV SOLN: 10.0000 meq | INTRAVENOUS | Status: DC

## 2020-07-16 MED ORDER — METOPROLOL TARTRATE 5 MG/5ML IV SOLN
5.0000 mg | Freq: Once | INTRAVENOUS | Status: AC
  Administered 2020-07-17: 5 mg via INTRAVENOUS
  Filled 2020-07-16: qty 5

## 2020-07-16 MED ORDER — CALCIUM GLUCONATE-NACL 1-0.675 GM/50ML-% IV SOLN
1.0000 g | Freq: Once | INTRAVENOUS | Status: AC
  Administered 2020-07-16: 1000 mg via INTRAVENOUS
  Filled 2020-07-16: qty 50

## 2020-07-16 MED ORDER — CALCIUM CARBONATE ANTACID 500 MG PO CHEW
1.0000 | CHEWABLE_TABLET | Freq: Three times a day (TID) | ORAL | Status: AC
  Administered 2020-07-16 (×3): 200 mg via ORAL
  Filled 2020-07-16 (×3): qty 1

## 2020-07-16 MED ORDER — POTASSIUM CHLORIDE 10 MEQ/100ML IV SOLN
10.0000 meq | INTRAVENOUS | Status: AC
  Administered 2020-07-16 (×2): 10 meq via INTRAVENOUS
  Filled 2020-07-16 (×2): qty 100

## 2020-07-16 MED ORDER — ENSURE ENLIVE PO LIQD
237.0000 mL | Freq: Two times a day (BID) | ORAL | Status: DC
  Administered 2020-07-16: 237 mL via ORAL

## 2020-07-16 MED ORDER — POTASSIUM CHLORIDE IN NACL 40-0.9 MEQ/L-% IV SOLN
INTRAVENOUS | Status: DC
  Filled 2020-07-16 (×2): qty 1000

## 2020-07-16 NOTE — Consult Note (Signed)
NEUROLOGY CONSULTATION NOTE   Date of service: July 16, 2020 Patient Name: Mathew Miller. MRN:  [id number] DOB:  1946-07-24 Reason for consult: "frontal stroke"  History of Present Illness  Mathew Miller. is a 74 y.o. male with PMH significant for HTN, HLD, dementia, prediabetes, sensorineural hearing loss who presents with fall and left sided weakness with unknown LKW but atleast several days ago. MRI Brain demonstrated 2 R frontal lobe white matter infarcts. MRA head with no LVO, carotid duplex is pending. Lipid panel with LDL of 69, HbA1c of 4.9.  He endorses almost a fall 3 days ago when he ran into someone, also reports left sided weakness that he is not sure how long has been going on for but thinks that it has been there for about 2 weeks. He lives with his wife. Does not smoke. Does not think that he takes an antiplatelet at home.  NIHSS: 2 Premorbid mRS: 2 TPA: no, outside the window Thrombectomy: no, outside the window.   ROS   Constitutional Denies weight loss, fever and chills.  HEENT Denies changes in vision and hearing.  Respiratory Denies SOB and cough.  CV Denies palpitations and CP  GI Denies abdominal pain, nausea, vomiting and diarrhea.  GU Endorses urinary frequency, denies dysuria.  MSK Denies myalgia and joint pain.  Skin Denies rash and pruritus.  Neurological Denies headache and syncope.  Psychiatric Denies recent changes in mood. Denies anxiety and depression.   Past History   Past Medical History:  Diagnosis Date   Constipation    Hyperlipidemia    Hypertension    Prediabetes    no meds    Vitamin D deficiency    Past Surgical History:  Procedure Laterality Date   CATARACT EXTRACTION, BILATERAL  2017   COLONOSCOPY     NO PAST SURGERIES     Family History  Problem Relation Age of Onset   Hyperlipidemia Mother    Glaucoma Mother    Heart disease Father    Hypertension Father    Colon cancer Neg Hx    Esophageal  cancer Neg Hx    Pancreatic cancer Neg Hx    Prostate cancer Neg Hx    Rectal cancer Neg Hx    Stomach cancer Neg Hx    Social History   Socioeconomic History   Marital status: Unknown    Spouse name: Not on file   Number of children: Not on file   Years of education: Not on file   Highest education level: Not on file  Occupational History   Occupation: retired   Tobacco Use   Smoking status: Never Smoker   Smokeless tobacco: Never Used  Scientific laboratory technician Use: Never used  Substance and Sexual Activity   Alcohol use: Yes    Alcohol/week: 0.0 standard drinks    Comment: very Rare /Beer.wine.   Drug use: No   Sexual activity: Not on file  Other Topics Concern   Not on file  Social History Narrative   Not on file   Social Determinants of Health   Financial Resource Strain:    Difficulty of Paying Living Expenses: Not on file  Food Insecurity:    Worried About [place] in the Last Year: Not on file   Ran Out of Food in the Last Year: Not on file  Transportation Needs:    Lack of Transportation (Medical): Not on file   Lack of Transportation (Non-Medical): Not on file  Physical Activity:    Days of Exercise per Week: Not on file   Minutes of Exercise per Session: Not on file  Stress:    Feeling of Stress : Not on file  Social Connections:    Frequency of Communication with Friends and Family: Not on file   Frequency of Social Gatherings with Friends and Family: Not on file   Attends Religious Services: Not on file   Active Member of [place] or Organizations: Not on file   Attends Archivist Meetings: Not on file   Marital Status: Not on file   Allergies  Allergen Reactions   Hydrocodone-Acetaminophen Nausea Only   Sudafed [Pseudoephedrine Hcl]     Urinary retention   Tetanus Toxoids     Itching, burning feeling, ran a fever    Medications   Medications Prior to Admission  Medication Sig Dispense Refill  Last Dose   fludrocortisone (FLORINEF) 0.1 MG tablet Take 2 tablets 3 x/day  to maintain Standing BP over 100 (Patient taking differently: Take 0.1 mg by mouth See admin instructions. Take one tablet by mouth six times daily per spouse) 540 tablet 1 07/15/2020 at Unknown time   Magnesium 250 MG TABS Take 250 mg by mouth daily.   07/15/2020 at Unknown time   midodrine (PROAMATINE) 10 MG tablet Take 1 tablet 3 x /day for Low BP (Patient taking differently: Take 10 mg by mouth 3 (three) times daily. ) 270 tablet 3 07/15/2020 at Unknown time   mirabegron ER (MYRBETRIQ) 50 MG TB24 tablet Take 1 tablet Daily for Bladder (Patient taking differently: Take 50 mg by mouth daily. ) 90 tablet 0 07/15/2020 at Unknown time   omeprazole (PRILOSEC) 20 MG capsule Take 1 capsule   2 x   /day   to Prevent Heartburn & Indigestion (Patient taking differently: Take 20 mg by mouth daily. ) 180 capsule 0 07/15/2020 at Unknown time   potassium chloride SA (KLOR-CON) 20 MEQ tablet Take 1 tablet 2 x /day for Potassium (Patient taking differently: Take 20 mEq by mouth 2 (two) times daily. ) 180 tablet 3 07/15/2020 at Unknown time   rosuvastatin (CRESTOR) 5 MG tablet Take 1 tablet Daily for Cholesterol (Patient taking differently: Take 5 mg by mouth daily. ) 90 tablet 3 07/15/2020 at Unknown time   senna-docusate (GNP STOOL SOFTENER/LAXATIVE) 8.6-50 MG tablet Take 1 tablet Daily for Constipation (Patient taking differently: Take 1 tablet by mouth daily. ) 90 tablet 3 07/15/2020 at Unknown time   vitamin B-12 (CYANOCOBALAMIN) 500 MCG tablet Dissolve 1 tablet under tongue daily (Patient taking differently: Take 500 mcg by mouth daily. Dissolve 1 tablet under tongue daily) 90 tablet 3 07/15/2020 at Unknown time   VITAMIN D PO Take 5,000 Units by mouth daily.    07/15/2020 at Unknown time   Accu-Chek Softclix Lancets lancets Check blood sugar 1 time a day.FB-X03.833. (Patient taking differently: 1 each by Other route See admin  instructions. Check blood sugar 1 time a day.XO-V29.191.) 100 each 1    Alcohol Swabs (B-D SINGLE USE SWABS REGULAR) PADS Check blood sugar 1 time daily-DX-Z87.898. (Patient taking differently: 1 each by Other route See admin instructions. Check blood sugar 1 time daily-DX-Z87.898.) 100 each 4    Blood Glucose Monitoring Suppl (ACCU-CHEK AVIVA PLUS) w/Device KIT USE AS DIRECTED (Patient not taking: Reported on 07/15/2020) 1 kit 0 Not Taking at Unknown time   glucose blood (ACCU-CHEK AVIVA PLUS) test strip Check blood sugar 1 time a day-DX-Z87.898 (Patient not  taking: Reported on 07/15/2020) 100 each 1 Not Taking at Unknown time   QUEtiapine (SEROQUEL) 50 MG tablet Take 1 to 2 tablets in late Afternoon for "Sundowning" (Patient not taking: Reported on 07/15/2020) 180 tablet 1 Not Taking at Unknown time     Vitals  Temp:  [97.5 F (36.4 C)-100 F (37.8 C)] 98.6 F (37 C) (09/29 1643) Pulse Rate:  [68-85] 79 (09/29 1643) Resp:  [13-23] 18 (09/29 1643) BP: (98-199)/(53-138) 193/95 (09/29 1643) SpO2:  [96 %-99 %] 97 % (09/29 1643)  Body mass index is 22.08 kg/m.  Physical Exam   General: Laying comfortably in bed; in no acute distress.  HENT: Normal oropharynx and mucosa. Normal external appearance of ears and nose. Neck: Supple, no pain or tenderness CV: No JVD. No peripheral edema. Pulmonary: Symmetric Chest rise. Normal respiratory effort. Abdomen: Soft to touch, non-tender Ext: No cyanosis, edema, or deformity  Skin: No rash. Normal palpation of skin.   Musculoskeletal: Normal digits and nails by inspection. No clubbing.  Neurologic Examination  Mental status/Cognition: Alert, oriented to self, place, but not to month. Oriented to year, slowed thought process and poor attention. Speech/language: Slowed, non fluent, comprehension intact, object naming intact. Cranial nerves:   CN II Pupils equal and reactive to light, no VF deficits   CN III,IV,VI EOM intact, no gaze preference or  deviation, no nystagmus   CN V Decreased to touch in left face   CN VII no asymmetry, ? mild nasolabial fold flattening on the left   CN VIII Hard of hearing BL.   CN IX & X normal palatal elevation, no uvular deviation   CN XI    CN XII midline tongue protrusion   Motor:  Muscle bulk: poor, tone normal, pronator drift yes, in LUE. Mvmt Root Nerve  Muscle Right Left Comments  SA C5/6 Ax Deltoid 5 4+   EF C5/6 Mc Biceps 5 5   EE C6/7/8 Rad Triceps 5 4+   WF C6/7 Med FCR 5 5   WE C7/8 PIN ECU 5 5   F Ab C8/T1 U ADM/FDI 5 4   HF L1/2/3 Fem Illopsoas 5 5   KE L2/3/4 Fem Quad 5 5   DF L4/5 D Peron Tib Ant 5 5   PF S1/2 Tibial Grc/Sol 5 5    Reflexes:  Right Left Comments  Pectoralis      Biceps (C5/6) 3 3   Brachioradialis (C5/6) 3 3    Triceps (C6/7) 3 3    Patellar (L3/4) 3 3 Cross adductors + BL   Achilles (S1) 3 3 No clonus thou   Hoffman + +    Plantar up up   Jaw jerk    Sensation:  Light touch Decreased in LUE and LLE to touch   Pin prick    Temperature    Vibration   Proprioception    Coordination/Complex Motor:  - Finger to Nose intact on the R - Heel to shin unable to assess due to poor attention. - Rapid alternating movement are slowed.   Labs   Lab Results  Component Value Date   NA 144 07/16/2020   K 2.9 (L) 07/16/2020   CL 120 (H) 07/16/2020   CO2 19 (L) 07/16/2020   GLUCOSE 101 (H) 07/16/2020   BUN 12 07/16/2020   CREATININE 0.83 07/16/2020   CALCIUM 5.6 (LL) 07/16/2020   ALBUMIN 3.2 (L) 07/15/2020   AST 27 07/15/2020   ALT 20 07/15/2020   ALKPHOS 81 07/15/2020  BILITOT 1.1 07/15/2020   GFRNONAA >60 07/16/2020   GFRAA >60 07/16/2020     Imaging and Diagnostic studies  MRI Brain and MRA Head without contrast: 1. Acute white matter infarcts in the right frontal lobe. 2. Moderate chronic small vessel ischemic disease with multiple chronic lacunar infarcts, progressed from 2019. 3. No major intracranial arterial occlusion. 4.  Mild-to-moderate right P2 stenosis.  CT C spine w/o C: 1. No evidence of acute fracture or traumatic malalignment. 2. Multilevel degenerative disc disease, greatest at C5-C6 and C6-C7.  Impression   Mathew David. is a 74 y.o. male with PMH significant for HTN, HLD, dementia, prediabetes, sensorineural hearing loss who presents with fall and left sided weakness with unknown LKW. NIHSS of 2. MRI Brain with R frontal white matter stroke.  His neurologic examination is also notable for diffuse hyperreflexia in addition to L sided weakness and decreased sensation to light touch.  Recommendations  - Brain imaging- MRI Brain with R frontal stroke - Vascular imaging- MRA Head with no LVO, Carotid duplex is pending. - TTE - EF of 60-65%, mild LV hypertrophy- Lipid panel - ordered  - Statin - not indicated, his LDL is less than 70. - A1C - 4.9. - Antithrombotic - Continue Aspirin 36m daily - SBP goal - aim for gradual normotension, he is more than 24 hours from his LKW. - Telemetry monitoring for arrythmia - Swallow screen - passed - Stroke education - PT/OT/SLP consulted - I ordered MRI cervical spine without contrast to rule out cord compression given hyperreflexia on exam with some urinary incontinence that he reported.  ______________________________________________________________________   Thank you for the opportunity to take part in the care of this patient. If you have any further questions, please contact the neurology consultation attending.  Signed,  SDownsPager Number [phone]

## 2020-07-16 NOTE — Progress Notes (Signed)
45 - Chotiner, Yevonne Aline, MD made aware of patients  Mathew Miller of yellow, blood pressure 208/104 with prn hydralazine discontinued, increased confusion per wife trying to attempt to get out bed to urinate despite condom cath.  2009 - Chotiner, Yevonne Aline, MD - orders for one time hydralazine  2100 - Neurologist Dr. Annice Pih at bedside, gradual normal tensive B/P is the goal    Patient mentation is better, will reassess B/P  Dr. Annice Pih - Ordered MRI cervical spine without contrast  2343 - Blood pressure 187/94  2347 - Chotiner, Yevonne Aline, MD -  One time order for lopressor  0210 - Blood pressure - 190/90   0221 - Chotiner, Yevonne Aline, MD - Losartan restarted

## 2020-07-16 NOTE — Progress Notes (Addendum)
[facility] PROGRESS NOTE    [name]  DGU:[id number] DOB: 02-Nov-1945 DOA: 07/15/2020 PCP: Unk Pinto, MD      Brief Narrative:  Mr. Fritze is a 74 y.o. M with dementia, home dwelling, labile orthostatic hypotension due to dysautonomia complicated by recurrent syncope who presented with left-sided weakness and fall.  Several days ago, patient had sudden onset left-sided weakness in the arm, this did not improve, and on the day of admission the patient had a syncopal episode that was worse than usual, so he was brought to the ER.  In the ER, potassium 2.5, creatinine at baseline, CT head no acute changes.  Admitted for hypokalemia and stroke work-up.      Assessment & Plan:  Scattered right frontal lobe white matter infarcts -Non-invasive angiography no major intracranial arterial occlusion, scattered small vessel disease -Echocardiogram pending -Carotid imaging pending -Lipids ordered, continue Crestor  -Aspirin ordered at admission --> continue low-dose aspirin -Atrial fibrillation: Not present on telemetry -tPA not given because outside the window -Dysphagia screen ordered in ER -PT eval ordered   Dementia -Continue quetiapine  Hypokalemia Hypocalcemia Low K due to Florinef use -Aggressive potassium supplementation -Calcium gluconate ordered -Trend electrolytes closely  AKI Creatinine 1.59 on admission, improved to 0.8 baseline with IV fluids, normal.  Orthostatic hypotension -Continue midodrine, Florinef  GERD -Continue PPI  Bladder dysfunction -Continue mirabegron       Disposition: Status is: Inpatient  Remains inpatient appropriate because:aggressive IV potassium supplementation needed, ongoing stroke work up and evaluation   Dispo: The patient is from: Home              Anticipated d/c is to: SNF              Anticipated d/c date is: 2 days              Patient currently is not medically stable to  d/c.   The patient was admitted with a new stroke, he has new left-sided deficits, that impair his ability to function independently.  He will likely need SNF at time of discharge, after aggressive supplementation of his potassium.           MDM: The below labs and imaging reports were reviewed and summarized above.  Medication management as above.    DVT prophylaxis: enoxaparin (LOVENOX) injection 40 mg Start: 07/15/20 2200  Code Status: Full code Family Communication: At the bedside           Subjective: Patient is pleasantly confused.  He has no vomiting, headache, change in left-sided weakness, palpitations, dyspnea.  Objective: Vitals:   07/16/20 0915 07/16/20 1036 07/16/20 1257 07/16/20 1643  BP: (!) 152/86 (!) 177/90 (!) 170/92 (!) 193/95  Pulse: 70 68 70 79  Resp: 17 16 18 18   Temp: 98.4 F (36.9 C) 100 F (37.8 C) (!) 97.5 F (36.4 C) 98.6 F (37 C)  TempSrc: Oral Oral Oral Axillary  SpO2: 97% 99% 98% 97%  Weight:      Height:        Intake/Output Summary (Last 24 hours) at 07/16/2020 1747 Last data filed at 07/16/2020 1505 Gross per 24 hour  Intake 1135.91 ml  Output 800 ml  Net 335.91 ml   Filed Weights   07/15/20 1316  Weight: 78 kg    Examination: General appearance:  adult male, alert and in no obvious distress.   HEENT: Anicteric, conjunctiva pink, lids and lashes normal. No nasal deformity, discharge, epistaxis.  Lips moist.   Skin: Warm and dry.  No jaundice.  No suspicious rashes or lesions. Cardiac: RRR, nl A6-T0, systolic murmur noted.  Capillary refill is brisk.  JVP normal.  No LE edema.  Radial pulses 2+ and symmetric. Respiratory: Normal respiratory rate and rhythm.  CTAB without rales or wheezes. Abdomen: Abdomen soft.  No TTP or guarding. No ascites, distension, hepatosplenomegaly.   MSK: No deformities or effusions. Neuro: Awake and alert.  EOMI, moves all extremities, but left side is weak in the arm, legs strength is  normal. Speech fluent.    Psych: Sensorium intact and responding to questions, attention normal. Affect blunted.  Judgment and insight appear severely impaired, unable to make intelligible answers.    Data Reviewed: I have personally reviewed following labs and imaging studies:  CBC: Recent Labs  Lab 07/15/20 1316 07/15/20 1327  WBC 8.6  --   NEUTROABS 6.5  --   HGB 13.1 12.2*  HCT 39.1 36.0*  MCV 88.9  --   PLT 150  --    Basic Metabolic Panel: Recent Labs  Lab 07/15/20 1316 07/15/20 1327 07/15/20 2116 07/16/20 0952  NA 144 145 145 144  K 2.5* 2.5* 2.6* 2.9*  CL 104 99 104 120*  CO2 31  --  31 19*  GLUCOSE 136* 132* 103* 101*  BUN 21 26* 18 12  CREATININE 1.59* 1.50* 1.35* 0.83  CALCIUM 8.6*  --  8.5* 5.6*  MG  --   --  2.8*  --    GFR: Estimated Creatinine Clearance: 83.5 mL/min (by C-G formula based on SCr of 0.83 mg/dL). Liver Function Tests: Recent Labs  Lab 07/15/20 1316  AST 27  ALT 20  ALKPHOS 81  BILITOT 1.1  PROT 5.8*  ALBUMIN 3.2*   No results for input(s): LIPASE, AMYLASE in the last 168 hours. No results for input(s): AMMONIA in the last 168 hours. Coagulation Profile: Recent Labs  Lab 07/15/20 1316  INR 1.0   Cardiac Enzymes: No results for input(s): CKTOTAL, CKMB, CKMBINDEX, TROPONINI in the last 168 hours. BNP (last 3 results) No results for input(s): PROBNP in the last 8760 hours. HbA1C: Recent Labs    07/16/20 0340  HGBA1C 4.9   CBG: Recent Labs  Lab 07/15/20 1317  GLUCAP 128*   Lipid Profile: Recent Labs    07/16/20 0340  CHOL 130  HDL 48  LDLCALC 69  TRIG 65  CHOLHDL 2.7   Thyroid Function Tests: No results for input(s): TSH, T4TOTAL, FREET4, T3FREE, THYROIDAB in the last 72 hours. Anemia Panel: No results for input(s): VITAMINB12, FOLATE, FERRITIN, TIBC, IRON, RETICCTPCT in the last 72 hours. Urine analysis:    Component Value Date/Time   COLORURINE STRAW (A) 07/15/2020 2116   APPEARANCEUR CLEAR 07/15/2020  2116   LABSPEC 1.010 07/15/2020 2116   PHURINE 7.0 07/15/2020 2116   GLUCOSEU NEGATIVE 07/15/2020 2116   HGBUR SMALL (A) 07/15/2020 2116   BILIRUBINUR NEGATIVE 07/15/2020 2116   KETONESUR NEGATIVE 07/15/2020 2116   PROTEINUR NEGATIVE 07/15/2020 2116   UROBILINOGEN 1 06/12/2014 1156   NITRITE NEGATIVE 07/15/2020 2116   LEUKOCYTESUR NEGATIVE 07/15/2020 2116   Sepsis Labs: @LABRCNTIP (procalcitonin:4,lacticacidven:4)  ) Recent Results (from the past 240 hour(s))  Respiratory Panel by RT PCR (Flu A&B, Covid) - Nasopharyngeal Swab     Status: None   Collection Time: 07/15/20  3:39 PM   Specimen: Nasopharyngeal Swab  Result Value Ref Range Status   SARS Coronavirus 2 by RT PCR NEGATIVE NEGATIVE Final  Comment: (NOTE) SARS-CoV-2 target nucleic acids are NOT DETECTED.  The SARS-CoV-2 RNA is generally detectable in upper respiratoy specimens during the acute phase of infection. The lowest concentration of SARS-CoV-2 viral copies this assay can detect is 131 copies/mL. A negative result does not preclude SARS-Cov-2 infection and should not be used as the sole basis for treatment or other patient management decisions. A negative result may occur with  improper specimen collection/handling, submission of specimen other than nasopharyngeal swab, presence of viral mutation(s) within the areas targeted by this assay, and inadequate number of viral copies (<131 copies/mL). A negative result must be combined with clinical observations, patient history, and epidemiological information. The expected result is Negative.  Fact Sheet for Patients:  PinkCheek.be  Fact Sheet for Healthcare Providers:  GravelBags.it  This test is no t yet approved or cleared by the [place] FDA and  has been authorized for detection and/or diagnosis of SARS-CoV-2 by FDA under an Emergency Use Authorization (EUA). This EUA will remain  in effect  (meaning this test can be used) for the duration of the COVID-19 declaration under Section 564(b)(1) of the Act, 21 U.S.C. section 360bbb-3(b)(1), unless the authorization is terminated or revoked sooner.     Influenza A by PCR NEGATIVE NEGATIVE Final   Influenza B by PCR NEGATIVE NEGATIVE Final    Comment: (NOTE) The Xpert Xpress SARS-CoV-2/FLU/RSV assay is intended as an aid in  the diagnosis of influenza from Nasopharyngeal swab specimens and  should not be used as a sole basis for treatment. Nasal washings and  aspirates are unacceptable for Xpert Xpress SARS-CoV-2/FLU/RSV  testing.  Fact Sheet for Patients: PinkCheek.be  Fact Sheet for Healthcare Providers: GravelBags.it  This test is not yet approved or cleared by the [place] FDA and  has been authorized for detection and/or diagnosis of SARS-CoV-2 by  FDA under an Emergency Use Authorization (EUA). This EUA will remain  in effect (meaning this test can be used) for the duration of the  Covid-19 declaration under Section 564(b)(1) of the Act, 21  U.S.C. section 360bbb-3(b)(1), unless the authorization is  terminated or revoked. Performed at [facility], [place] [address], [place], [place] [address]          Radiology Studies: DG Ribs Unilateral W/Chest Left  Result Date: 07/15/2020 CLINICAL DATA:  Left rib pain after fall. EXAM: LEFT RIBS AND CHEST - 3+ VIEW COMPARISON:  May 22, 2020. FINDINGS: No fracture or other bone lesions are seen involving the ribs. There is no evidence of pneumothorax or pleural effusion. Both lungs are clear. Heart size and mediastinal contours are within normal limits. IMPRESSION: Negative. Electronically Signed   By: Marijo Conception M.D.   On: 07/15/2020 14:35   DG Tibia/Fibula Left  Result Date: 07/15/2020 CLINICAL DATA:  Left leg pain after fall. EXAM: LEFT TIBIA AND FIBULA - 2 VIEW COMPARISON:  None. FINDINGS: There is  no evidence of fracture or other focal bone lesions. Soft tissues are unremarkable. IMPRESSION: Negative. Electronically Signed   By: Marijo Conception M.D.   On: 07/15/2020 14:38   CT HEAD WO CONTRAST  Result Date: 07/15/2020 CLINICAL DATA:  Neuro deficit, stroke suspected. EXAM: CT HEAD WITHOUT CONTRAST CT CERVICAL SPINE WITHOUT CONTRAST TECHNIQUE: Multidetector CT imaging of the head and cervical spine was performed following the standard protocol without intravenous contrast. Multiplanar CT image reconstructions of the cervical spine were also generated. COMPARISON:  None. FINDINGS: CT HEAD FINDINGS Brain: No evidence of acute  large vascular territory infarction, hemorrhage, hydrocephalus, extra-axial collection or mass lesion/mass effect. Similar appearance of a remote infarct in the right frontal white matter. Similar additional patchy white matter hypoattenuation, compatible with chronic microvascular ischemic disease. Vascular: Calcific intracranial atherosclerosis. Skull: Normal. Negative for fracture or focal lesion. Sinuses/Orbits: No acute finding. Other: No mastoid effusion. CT CERVICAL SPINE FINDINGS Alignment: Exaggerated cervical lordosis, likely positional. Mild anterolisthesis of C4 on C5, likely degenerative given facet arthropathy at this level. Skull base and vertebrae: Vertebral body heights are maintained. No evidence of acute fracture. Soft tissues and spinal canal: No prevertebral fluid or swelling. No visible canal hematoma. Disc levels: Multilevel degenerative disc disease, greatest at C5-C6 and C6-C7. No evidence of advanced bony canal stenosis. Bulky multilevel facet hypertrophy. Upper chest: Mild paraseptal emphysema without consolidation. Other: Vascular calcifications. IMPRESSION: 1. No evidence of acute fracture or traumatic malalignment. 2. Multilevel degenerative disc disease, greatest at C5-C6 and C6-C7. Electronically Signed   By: Margaretha Sheffield MD   On: 07/15/2020 16:50    CT Cervical Spine Wo Contrast  Result Date: 07/15/2020 CLINICAL DATA:  Neuro deficit, stroke suspected. EXAM: CT HEAD WITHOUT CONTRAST CT CERVICAL SPINE WITHOUT CONTRAST TECHNIQUE: Multidetector CT imaging of the head and cervical spine was performed following the standard protocol without intravenous contrast. Multiplanar CT image reconstructions of the cervical spine were also generated. COMPARISON:  None. FINDINGS: CT HEAD FINDINGS Brain: No evidence of acute large vascular territory infarction, hemorrhage, hydrocephalus, extra-axial collection or mass lesion/mass effect. Similar appearance of a remote infarct in the right frontal white matter. Similar additional patchy white matter hypoattenuation, compatible with chronic microvascular ischemic disease. Vascular: Calcific intracranial atherosclerosis. Skull: Normal. Negative for fracture or focal lesion. Sinuses/Orbits: No acute finding. Other: No mastoid effusion. CT CERVICAL SPINE FINDINGS Alignment: Exaggerated cervical lordosis, likely positional. Mild anterolisthesis of C4 on C5, likely degenerative given facet arthropathy at this level. Skull base and vertebrae: Vertebral body heights are maintained. No evidence of acute fracture. Soft tissues and spinal canal: No prevertebral fluid or swelling. No visible canal hematoma. Disc levels: Multilevel degenerative disc disease, greatest at C5-C6 and C6-C7. No evidence of advanced bony canal stenosis. Bulky multilevel facet hypertrophy. Upper chest: Mild paraseptal emphysema without consolidation. Other: Vascular calcifications. IMPRESSION: 1. No evidence of acute fracture or traumatic malalignment. 2. Multilevel degenerative disc disease, greatest at C5-C6 and C6-C7. Electronically Signed   By: Margaretha Sheffield MD   On: 07/15/2020 16:50   MR ANGIO HEAD WO CONTRAST  Result Date: 07/15/2020 CLINICAL DATA:  Left-sided weakness. EXAM: MRI HEAD WITHOUT CONTRAST MRA HEAD WITHOUT CONTRAST TECHNIQUE:  Multiplanar, multiecho pulse sequences of the brain and surrounding structures were obtained without intravenous contrast. Angiographic images of the head were obtained using MRA technique without contrast. COMPARISON:  Head CT 07/15/2020 and MRI 01/19/2018 FINDINGS: MRI HEAD FINDINGS Brain: There is a 1.5 cm acute white matter infarct in the posterior right frontal lobe at the level of the centrum semiovale and upper corona radiata, and there is also a punctate acute white matter infarct more anteriorly in the right frontal lobe in the centrum semiovale. There is an increased number of chronic microhemorrhages scattered both peripherally in both cerebral hemispheres as well as in the left thalamus and basal ganglia compared to the prior MRI which may be related to chronic hypertension. T2 hyperintensities in the cerebral white matter bilaterally and in the pons have progressed from the prior MRI and are nonspecific but compatible with moderate chronic small vessel ischemic disease.  There is an increased number of chronic lacunar infarcts in the cerebral white matter bilaterally. Chronic infarcts are also noted in the thalami and body of the corpus callosum. No mass, midline shift, or extra-axial fluid collection is identified. There is mild cerebral atrophy. Vascular: Major intracranial vascular flow voids are preserved. Skull and upper cervical spine: Unremarkable bone marrow signal. Sinuses/Orbits: Bilateral cataract extraction. Paranasal sinuses and mastoid air cells are clear. Other: None. MRA HEAD FINDINGS The visualized distal vertebral arteries are widely patent to the basilar with the left being slightly dominant. Patent bilateral PICA, right AICA, and bilateral SCA origins are identified. The basilar artery is widely patent. Posterior communicating arteries are diminutive or absent. Both PCAs are patent without evidence of a significant proximal stenosis on the left. There is a mild-to-moderate proximal  right P2 stenosis. The internal carotid arteries are widely patent from skull base to carotid termini. ACAs and MCAs are patent without evidence of a proximal branch occlusion or significant proximal stenosis. No aneurysm is identified. IMPRESSION: 1. Acute white matter infarcts in the right frontal lobe. 2. Moderate chronic small vessel ischemic disease with multiple chronic lacunar infarcts, progressed from 2019. 3. No major intracranial arterial occlusion. 4. Mild-to-moderate right P2 stenosis. Electronically Signed   By: Logan Bores M.D.   On: 07/15/2020 19:45   MR BRAIN WO CONTRAST  Result Date: 07/15/2020 CLINICAL DATA:  Left-sided weakness. EXAM: MRI HEAD WITHOUT CONTRAST MRA HEAD WITHOUT CONTRAST TECHNIQUE: Multiplanar, multiecho pulse sequences of the brain and surrounding structures were obtained without intravenous contrast. Angiographic images of the head were obtained using MRA technique without contrast. COMPARISON:  Head CT 07/15/2020 and MRI 01/19/2018 FINDINGS: MRI HEAD FINDINGS Brain: There is a 1.5 cm acute white matter infarct in the posterior right frontal lobe at the level of the centrum semiovale and upper corona radiata, and there is also a punctate acute white matter infarct more anteriorly in the right frontal lobe in the centrum semiovale. There is an increased number of chronic microhemorrhages scattered both peripherally in both cerebral hemispheres as well as in the left thalamus and basal ganglia compared to the prior MRI which may be related to chronic hypertension. T2 hyperintensities in the cerebral white matter bilaterally and in the pons have progressed from the prior MRI and are nonspecific but compatible with moderate chronic small vessel ischemic disease. There is an increased number of chronic lacunar infarcts in the cerebral white matter bilaterally. Chronic infarcts are also noted in the thalami and body of the corpus callosum. No mass, midline shift, or extra-axial  fluid collection is identified. There is mild cerebral atrophy. Vascular: Major intracranial vascular flow voids are preserved. Skull and upper cervical spine: Unremarkable bone marrow signal. Sinuses/Orbits: Bilateral cataract extraction. Paranasal sinuses and mastoid air cells are clear. Other: None. MRA HEAD FINDINGS The visualized distal vertebral arteries are widely patent to the basilar with the left being slightly dominant. Patent bilateral PICA, right AICA, and bilateral SCA origins are identified. The basilar artery is widely patent. Posterior communicating arteries are diminutive or absent. Both PCAs are patent without evidence of a significant proximal stenosis on the left. There is a mild-to-moderate proximal right P2 stenosis. The internal carotid arteries are widely patent from skull base to carotid termini. ACAs and MCAs are patent without evidence of a proximal branch occlusion or significant proximal stenosis. No aneurysm is identified. IMPRESSION: 1. Acute white matter infarcts in the right frontal lobe. 2. Moderate chronic small vessel ischemic disease with multiple  chronic lacunar infarcts, progressed from 2019. 3. No major intracranial arterial occlusion. 4. Mild-to-moderate right P2 stenosis. Electronically Signed   By: Logan Bores M.D.   On: 07/15/2020 19:45   ECHOCARDIOGRAM COMPLETE  Result Date: 07/16/2020    ECHOCARDIOGRAM REPORT   Patient Name:   Reco Shonk. Date of Exam: 07/16/2020 Medical Rec #:  [id number]           Height:       74.0 in Accession #:    [id number]          Weight:       172.0 lb Date of Birth:  Mar 12, 1946           BSA:          2.039 m Patient Age:    16 years            BP:           170/92 mmHg Patient Gender: M                   HR:           80 bpm. Exam Location:  Inpatient Procedure: 2D Echo Indications:    TIA 435.9  History:        Patient has prior history of Echocardiogram examinations, most                 recent 04/10/2019. Chronic kidney disease,                  Signs/Symptoms:Syncope; Risk Factors:Hypertension and                 Dyslipidemia.  Sonographer:    Johny Chess Referring Phys: [id number] [place]  1. Left ventricular ejection fraction, by estimation, is 60 to 65%. The left ventricle has normal function. The left ventricle has no regional wall motion abnormalities. There is mild left ventricular hypertrophy. Left ventricular diastolic parameters are consistent with Grade I diastolic dysfunction (impaired relaxation).  2. Right ventricular systolic function is normal. The right ventricular size is normal.  3. The mitral valve is normal in structure. Trivial mitral valve regurgitation. No evidence of mitral stenosis.  4. The aortic valve is tricuspid. Aortic valve regurgitation is not visualized. Mild to moderate aortic valve sclerosis/calcification is present, without any evidence of aortic stenosis.  5. The inferior vena cava is normal in size with greater than 50% respiratory variability, suggesting right atrial pressure of 3 mmHg. Comparison(s): No significant change from prior study. Prior images reviewed side by side. Conclusion(s)/Recommendation(s): No intracardiac source of embolism detected on this transthoracic study. A transesophageal echocardiogram is recommended to exclude cardiac source of embolism if clinically indicated. FINDINGS  Left Ventricle: Left ventricular ejection fraction, by estimation, is 60 to 65%. The left ventricle has normal function. The left ventricle has no regional wall motion abnormalities. The left ventricular internal cavity size was normal in size. There is  mild left ventricular hypertrophy. Left ventricular diastolic parameters are consistent with Grade I diastolic dysfunction (impaired relaxation). Right Ventricle: The right ventricular size is normal. No increase in right ventricular wall thickness. Right ventricular systolic function is normal. Left Atrium: Left atrial size was normal in  size. Right Atrium: Right atrial size was normal in size. Pericardium: There is no evidence of pericardial effusion. Mitral Valve: The mitral valve is normal in structure. Trivial mitral valve regurgitation. No evidence of mitral valve stenosis. Tricuspid Valve: The tricuspid valve is normal  in structure. Tricuspid valve regurgitation is not demonstrated. No evidence of tricuspid stenosis. Aortic Valve: The aortic valve is tricuspid. Aortic valve regurgitation is not visualized. Mild to moderate aortic valve sclerosis/calcification is present, without any evidence of aortic stenosis. Pulmonic Valve: The pulmonic valve was normal in structure. Pulmonic valve regurgitation is trivial. No evidence of pulmonic stenosis. Aorta: The aortic root is normal in size and structure. Venous: The inferior vena cava is normal in size with greater than 50% respiratory variability, suggesting right atrial pressure of 3 mmHg. IAS/Shunts: No atrial level shunt detected by color flow Doppler.  LEFT VENTRICLE PLAX 2D LVIDd:         4.30 cm  Diastology LVIDs:         3.00 cm  LV e' medial:    6.53 cm/s LV PW:         1.10 cm  LV E/e' medial:  13.7 LV IVS:        1.20 cm  LV e' lateral:   7.18 cm/s LVOT diam:     2.00 cm  LV E/e' lateral: 12.4 LV SV:         118 LV SV Index:   58 LVOT Area:     3.14 cm  RIGHT VENTRICLE             IVC RV S prime:     20.00 cm/s  IVC diam: 1.50 cm TAPSE (M-mode): 2.8 cm LEFT ATRIUM             Index       RIGHT ATRIUM           Index LA diam:        4.00 cm 1.96 cm/m  RA Area:     12.00 cm LA Vol (A2C):   53.4 ml 26.20 ml/m RA Volume:   23.80 ml  11.68 ml/m LA Vol (A4C):   59.8 ml 29.33 ml/m LA Biplane Vol: 59.2 ml 29.04 ml/m  AORTIC VALVE LVOT Vmax:   149.00 cm/s LVOT Vmean:  113.000 cm/s LVOT VTI:    0.375 m  AORTA Ao Root diam: 3.30 cm Ao Asc diam:  3.40 cm MITRAL VALVE MV Area (PHT): 3.65 cm     SHUNTS MV Decel Time: 208 msec     Systemic VTI:  0.38 m MV E velocity: 89.30 cm/s   Systemic Diam:  2.00 cm MV A velocity: 111.00 cm/s MV E/A ratio:  0.80 Candee Furbish MD Electronically signed by Candee Furbish MD Signature Date/Time: 07/16/2020/4:39:26 PM    Final         Scheduled Meds: .  stroke: mapping our early stages of recovery book   Does not apply Once  . aspirin EC  81 mg Oral Daily  . calcium carbonate  1 tablet Oral TID  . cholecalciferol  5,000 Units Oral Daily  . enoxaparin (LOVENOX) injection  40 mg Subcutaneous Q24H  . feeding supplement (ENSURE ENLIVE)  237 mL Oral BID BM  . fludrocortisone  0.1 mg Oral BID  . magnesium oxide  200 mg Oral Daily  . midodrine  10 mg Oral TID with meals  . mirabegron ER  50 mg Oral Daily  . pantoprazole  40 mg Oral Daily  . QUEtiapine  25 mg Oral QHS  . rosuvastatin  20 mg Oral Daily  . senna-docusate  1 tablet Oral Daily  . vitamin B-12  1,000 mcg Oral Daily   Continuous Infusions:   LOS: 1 day  Time spent: 25 minutes    Edwin Dada, MD [facility] 07/16/2020, 5:47 PM     Please page though [place] or Epic secure chat:  For [employer], Adult nurse

## 2020-07-16 NOTE — Progress Notes (Signed)
  Echocardiogram 2D Echocardiogram has been performed.  Mathew Miller 07/16/2020, 4:26 PM

## 2020-07-16 NOTE — Evaluation (Signed)
Physical Therapy Evaluation Patient Details Name: Mathew Miller. MRN: [id number] DOB: November 08, 1945 Today's Date: 07/16/2020   History of Present Illness  Mathew Miller. is a 74 y.o. male with medical history significant of orthostatic hypotension, frequent recurrent near syncope and syncope, vascular dementia, HTN, HLD, CKD stage II, chronic hypokalemia on supplement, prediabetes, presented with persistent left-sided weakness.  Had another episode of near syncope and fell on L side at home.  MRI positive for Acute white matter infarcts in the right frontal lobe.  Clinical Impression  Patient presents with decreased mobility due to new L side weakness, decreased balance, decreased activity tolerance, decreased safety awareness, and general weakness.  He currently needs mod to max A to stand at the bedside and to take side steps to [facility].  Significantly limited by orthostatic hypotension with BP measurements as below.  Wife reports he falls at home on average of 2x/week due to pre-syncope.  Feel he will need SNF level rehab at d/c.  Wife prefers [facility] as noted below.  Orthostatic VS for the past 24 hrs (Last 3 readings):  BP- Lying Pulse- Lying BP- Sitting Pulse- Sitting BP- Standing at 0 minutes Pulse- Standing at 0 minutes BP- Standing at 3 minutes Pulse- Standing at 3 minutes  07/16/20 1200 164/88 78 117/79 75 91/57 80 (!) 79/50 81  Did not tolerate standing 3 minutes, took BP again after sit to stand x 3 for side steps to [facility] .     Follow Up Recommendations SNF (wife prefers [facility] as they live in [place])    Financial risk analyst (measurements PT)    Recommendations for Other Services       Precautions / Restrictions Precautions Precautions: Fall Precaution Comments: orthostatic hypotension; multiple falls at home      Mobility  Bed Mobility Overal bed mobility: Needs Assistance Bed Mobility: Supine to Sit;Sit to Supine     Supine to sit: Mod  assist;HOB elevated Sit to supine: Mod assist   General bed mobility comments: increased time, wife donned shoes in supine, assist to lift trunk and scoot hips, to supine assist for legs into bed and positioning trunk once supine  Transfers Overall transfer level: Needs assistance Equipment used: Rolling walker (2 wheeled) Transfers: Sit to/from Stand Sit to Stand: From elevated surface;Mod assist;Max assist         General transfer comment: initially some lifting help and cues for hand placement, increased attempts (x 2 more) needed more help with low BP and leaning posterior  Ambulation/Gait Ambulation/Gait assistance: Mod assist;Max assist Gait Distance (Feet): 1 Feet Assistive device: Rolling walker (2 wheeled) Gait Pattern/deviations: Step-to pattern;Trunk flexed;Leaning posteriorly     General Gait Details: side stepping towards HOB pt with bilateral LE weakness and flexed trunk/knees difficulty moving legs over toward [facility]; needed 2 attempts to achieve positioning in bed  Stairs            Wheelchair Mobility    Modified Rankin (Stroke Patients Only) Modified Rankin (Stroke Patients Only) Pre-Morbid Rankin Score: Moderately severe disability Modified Rankin: Severe disability     Balance Overall balance assessment: Needs assistance   Sitting balance-Leahy Scale: Poor Sitting balance - Comments: leans back and to L initially S for balance, but with BP drop needing min A Postural control: Left lateral lean Standing balance support: Bilateral upper extremity supported Standing balance-Leahy Scale: Poor Standing balance comment: UE support and min to mod A for balance  Pertinent Vitals/Pain Pain Assessment: Faces Faces Pain Scale: Hurts a little bit Pain Location: generalized Pain Descriptors / Indicators: Aching Pain Intervention(s): Monitored during session    Home Living Family/patient expects to be discharged  to:: Private residence Living Arrangements: Spouse/significant other Available Help at Discharge: Family Type of Home: House Home Access: Stairs to enter Entrance Stairs-Rails: Psychiatric nurse of Steps: 6 Home Layout: Multi-level Home Equipment: Cane - single point;Walker - 2 wheels;Shower seat      Prior Function Level of Independence: Needs assistance   Gait / Transfers Assistance Needed: walks with cane and assist (wife reports if he passes out while walking falls with the walker are worse)  ADL's / Homemaking Assistance Needed: wife assists with all except feeding, but since L hand weakness and IV in R arms she has been helping        Hand Dominance        Extremity/Trunk Assessment   Upper Extremity Assessment Upper Extremity Assessment: LUE deficits/detail LUE Deficits / Details: decreased grip compared to R, positive drift with shoulder elevation; strength 3/5 shoulder flexion, 3+/5 elbow flex LUE Sensation: decreased light touch    Lower Extremity Assessment Lower Extremity Assessment: Generalized weakness    Cervical / Trunk Assessment Cervical / Trunk Assessment: Kyphotic  Communication      Cognition Arousal/Alertness: Awake/alert Behavior During Therapy: WFL for tasks assessed/performed Overall Cognitive Status: History of cognitive impairments - at baseline                                 General Comments: dementia at baseline      General Comments General comments (skin integrity, edema, etc.): wife in the room, reports BP issues are chronic and he falls usually on average 2x/week due to pre-syncope, reports at worst might fall 5-6 times in a week.  BP taked supine, sitting, initial standing and after several sit to stands as pt unable to tolerate standing 3 minutes    Exercises     Assessment/Plan    PT Assessment Patient needs continued PT services  PT Problem List Decreased strength;Decreased mobility;Decreased  safety awareness;Decreased balance;Decreased knowledge of use of DME;Cardiopulmonary status limiting activity;Decreased activity tolerance       PT Treatment Interventions DME instruction;Therapeutic activities;Gait training;Therapeutic exercise;Patient/family education;Balance training;Functional mobility training;Stair training    PT Goals (Current goals can be found in the Care Plan section)  Acute Rehab PT Goals Patient Stated Goal: per wife to be able to walk, get up steps, though she is working to get a ramp PT Goal Formulation: With patient/family Time For Goal Achievement: 07/30/20 Potential to Achieve Goals: Good    Frequency Min 3X/week   Barriers to discharge        Co-evaluation               AM-PAC PT "6 Clicks" Mobility  Outcome Measure Help needed turning from your back to your side while in a flat bed without using bedrails?: A Little Help needed moving from lying on your back to sitting on the side of a flat bed without using bedrails?: A Lot Help needed moving to and from a bed to a chair (including a wheelchair)?: Total Help needed standing up from a chair using your arms (e.g., wheelchair or bedside chair)?: Total Help needed to walk in hospital room?: Total Help needed climbing 3-5 steps with a railing? : Total 6 Click Score: 9    End  of Session   Activity Tolerance: Patient limited by fatigue;Treatment limited secondary to medical complications (Comment) (orthostatic hypotension) Patient left: in bed;with call bell/phone within reach;with family/visitor present Nurse Communication: Other (comment) (BP drop) PT Visit Diagnosis: Other abnormalities of gait and mobility (R26.89);History of falling (Z91.81);Other symptoms and signs involving the nervous system (R29.898)    Time: 1150-1220 PT Time Calculation (min) (ACUTE ONLY): 30 min   Charges:   PT Evaluation $PT Eval Moderate Complexity: 1 Mod PT Treatments $Therapeutic Activity: 8-22 mins         Magda Kiel, PT Acute Rehabilitation Services OJZBF:[phone] Office:[phone] 07/16/2020   Reginia Naas 07/16/2020, 12:39 PM

## 2020-07-17 ENCOUNTER — Inpatient Hospital Stay (HOSPITAL_COMMUNITY): Payer: Medicare HMO

## 2020-07-17 DIAGNOSIS — I6300 Cerebral infarction due to thrombosis of unspecified precerebral artery: Secondary | ICD-10-CM

## 2020-07-17 LAB — CBC
HCT: 36.2 % — ABNORMAL LOW (ref 39.0–52.0)
Hemoglobin: 12 g/dL — ABNORMAL LOW (ref 13.0–17.0)
MCH: 29 pg (ref 26.0–34.0)
MCHC: 33.1 g/dL (ref 30.0–36.0)
MCV: 87.4 fL (ref 80.0–100.0)
Platelets: 141 10*3/uL — ABNORMAL LOW (ref 150–400)
RBC: 4.14 MIL/uL — ABNORMAL LOW (ref 4.22–5.81)
RDW: 14.1 % (ref 11.5–15.5)
WBC: 8.1 10*3/uL (ref 4.0–10.5)
nRBC: 0 % (ref 0.0–0.2)

## 2020-07-17 LAB — BASIC METABOLIC PANEL
Anion gap: 7 (ref 5–15)
BUN: 11 mg/dL (ref 8–23)
CO2: 29 mmol/L (ref 22–32)
Calcium: 8.7 mg/dL — ABNORMAL LOW (ref 8.9–10.3)
Chloride: 108 mmol/L (ref 98–111)
Creatinine, Ser: 1.1 mg/dL (ref 0.61–1.24)
GFR calc Af Amer: >60 mL/min (ref >60)
GFR calc non Af Amer: >60 mL/min (ref >60)
Glucose, Bld: 96 mg/dL (ref 70–99)
Potassium: 3.4 mmol/L — ABNORMAL LOW (ref 3.5–5.1)
Sodium: 144 mmol/L (ref 135–145)

## 2020-07-17 MED ORDER — ADULT MULTIVITAMIN W/MINERALS CH
1.0000 | ORAL_TABLET | Freq: Every day | ORAL | Status: DC
  Administered 2020-07-17 – 2020-07-18 (×2): 1 via ORAL
  Filled 2020-07-17 (×2): qty 1

## 2020-07-17 MED ORDER — LABETALOL HCL 100 MG PO TABS
100.0000 mg | ORAL_TABLET | Freq: Three times a day (TID) | ORAL | Status: DC | PRN
  Administered 2020-07-17: 100 mg via ORAL
  Filled 2020-07-17: qty 1

## 2020-07-17 MED ORDER — POLYETHYLENE GLYCOL 3350 17 G PO PACK
17.0000 g | PACK | Freq: Every day | ORAL | Status: DC
  Administered 2020-07-17 – 2020-07-18 (×2): 17 g via ORAL
  Filled 2020-07-17 (×2): qty 1

## 2020-07-17 MED ORDER — SENNOSIDES-DOCUSATE SODIUM 8.6-50 MG PO TABS
1.0000 | ORAL_TABLET | Freq: Every day | ORAL | Status: DC
  Filled 2020-07-17: qty 1

## 2020-07-17 MED ORDER — LOSARTAN POTASSIUM 50 MG PO TABS
50.0000 mg | ORAL_TABLET | Freq: Every day | ORAL | Status: DC
  Administered 2020-07-17: 50 mg via ORAL
  Filled 2020-07-17: qty 1

## 2020-07-17 MED ORDER — CLOPIDOGREL BISULFATE 75 MG PO TABS
75.0000 mg | ORAL_TABLET | Freq: Every day | ORAL | Status: DC
  Administered 2020-07-17 – 2020-07-18 (×2): 75 mg via ORAL
  Filled 2020-07-17 (×2): qty 1

## 2020-07-17 MED ORDER — ENSURE ENLIVE PO LIQD
237.0000 mL | Freq: Three times a day (TID) | ORAL | Status: DC
  Administered 2020-07-17 – 2020-07-18 (×3): 237 mL via ORAL

## 2020-07-17 NOTE — Progress Notes (Signed)
SLP Cancellation Note  Patient Details Name: Mathew Miller. MRN: [id number] DOB: 1946/03/09   Cancelled treatment:       Reason Eval/Treat Not Completed: Fatigue/lethargy limiting ability to participate (Pt asleep and wife requested that SLP re-attempts later. SLP will follow up later as schedule allows.)  Mathew Miller Negus, [place], [place] Office number [phone] Pager [place] 07/17/2020, 10:19 AM

## 2020-07-17 NOTE — Evaluation (Signed)
Occupational Therapy Evaluation Patient Details Name: Mathew Miller. MRN: [id number] DOB: 1945-12-29 Today's Date: 07/17/2020    History of Present Illness Mathew Miller. is a 74 y.o. male with medical history significant of orthostatic hypotension, frequent recurrent near syncope and syncope, vascular dementia, HTN, HLD, CKD stage II, chronic hypokalemia on supplement, prediabetes, presented with persistent left-sided weakness.  Had another episode of near syncope and fell on L side at home.  MRI positive for Acute white matter infarcts in the right frontal lobe.   Clinical Impression   Pt admitted with the above diagnoses and presents with below problem list. Pt will benefit from continued acute OT to address the below listed deficits and maximize independence with basic ADLs prior to d/c to venue below. PTA, spouse reports pt utilized cane vs walker to mobilize, assist with UB/LB ADLs, was able to feed self with setup provided. Orthostatic vitals assessed as follows: supine: 188/97 (HR 70), sitting EOB: 142/83 (HR 76). Spouse present throughout session. Pt lethargic in supine but more alert sitting EOB. Currently max - total A with basic ADLs at bed level or supported sitting.     Follow Up Recommendations  SNF    Equipment Recommendations  Other (comment) (defer to next venue)    Recommendations for Other Services       Precautions / Restrictions Precautions Precautions: Fall Precaution Comments: orthostatic hypotension; multiple falls at home Restrictions Weight Bearing Restrictions: No      Mobility Bed Mobility Overal bed mobility: Needs Assistance Bed Mobility: Supine to Sit;Sit to Supine     Supine to sit: Mod assist;HOB elevated Sit to supine: Mod assist   General bed mobility comments: decreased initiation. assist to advance BLE off bed and powerup trunk. Utilized bed pad to pivot hips. Assist to guide descent of trunk and to advance BLE onto bed at end of  session  Transfers                 General transfer comment: unable to assess due to significant drop in bp (see general comments).     Balance Overall balance assessment: Needs assistance   Sitting balance-Leahy Scale: Poor Sitting balance - Comments: min A to steady trunk (vs cues to sit upright long enough for bp check)                                   ADL either performed or assessed with clinical judgement   ADL Overall ADL's : Needs assistance/impaired Eating/Feeding: Bed level;Total assistance;Maximal assistance   Grooming: Maximal assistance;Total assistance   Upper Body Bathing: Total assistance;Sitting;Bed level   Lower Body Bathing: Total assistance;Bed level;+2 for safety/equipment;Maximal assistance;Sitting/lateral leans   Upper Body Dressing : Maximal assistance;Total assistance;Sitting;Bed level   Lower Body Dressing: Maximal assistance;Total assistance;+2 for safety/equipment;Sitting/lateral leans;Bed level                 General ADL Comments: Pt completed bed mobility and sat EOB long enough for blood pressure assessment then returned to supine.      Vision   Additional Comments: difficult to assess due to cognition and lethargy      Perception     Praxis      Pertinent Vitals/Pain Pain Assessment: Faces Faces Pain Scale: Hurts a little bit Pain Location: generalized Pain Descriptors / Indicators: Aching Pain Intervention(s): Monitored during session     Hand Dominance  Extremity/Trunk Assessment Upper Extremity Assessment Upper Extremity Assessment: Generalized weakness;LUE deficits/detail;Difficult to assess due to impaired cognition LUE Deficits / Details: did not initiate moving against gravity or attempt to use in functional context. forearm in pronation sitting EOB. Difficult to assess 2/2 cognition and lethargy. LUE Coordination: decreased fine motor;decreased gross motor   Lower Extremity  Assessment Lower Extremity Assessment: Defer to PT evaluation   Cervical / Trunk Assessment Cervical / Trunk Assessment: Kyphotic   Communication     Cognition Arousal/Alertness: Lethargic;Suspect due to medications (more alert sitting EOB) Behavior During Therapy: Flat affect Overall Cognitive Status: History of cognitive impairments - at baseline                                 General Comments: dementia at baseline   General Comments  bp in supine at start of session 188/97 (HR 70), Sitting EOB 142/83 (HR 76). Spouse present throughout session. She reports he sometimes fallls a few times a day.     Exercises     Shoulder Instructions      Home Living Family/patient expects to be discharged to:: Private residence Living Arrangements: Spouse/significant other Available Help at Discharge: Family Type of Home: House Home Access: Stairs to enter Technical brewer of Steps: 6 Entrance Stairs-Rails: Right;Left Home Layout: Multi-level Alternate Level Stairs-Number of Steps: 5 to bedrooms, doesn't go downstairs Alternate Level Stairs-Rails: Right Bathroom Shower/Tub: Occupational psychologist: Handicapped height     Home Equipment: [place] - single point;Walker - 2 wheels;Shower seat          Prior Functioning/Environment Level of Independence: Needs assistance  Gait / Transfers Assistance Needed: walks with cane and assist (wife reports if he passes out while walking falls with the walker are worse) ADL's / Homemaking Assistance Needed: wife assists with all except feeding, but since L hand weakness and IV in R arms she has been helping Communication / Swallowing Assistance Needed: HOH          OT Problem List: Decreased strength;Decreased range of motion;Decreased activity tolerance;Impaired balance (sitting and/or standing);Decreased coordination;Decreased cognition;Decreased safety awareness;Decreased knowledge of use of DME or AE;Decreased  knowledge of precautions;Cardiopulmonary status limiting activity;Impaired sensation;Impaired tone;Impaired UE functional use;Pain      OT Treatment/Interventions: Self-care/ADL training;Therapeutic exercise;Neuromuscular education;Energy conservation;DME and/or AE instruction;Therapeutic activities;Cognitive remediation/compensation;Patient/family education;Balance training    OT Goals(Current goals can be found in the care plan section) Acute Rehab OT Goals Patient Stated Goal: per wife to be able to walk, get up steps, though she is working to get a ramp OT Goal Formulation: With family Time For Goal Achievement: 07/31/20 Potential to Achieve Goals: Fair ADL Goals Pt Will Perform Eating: with mod assist;bed level;sitting Pt Will Perform Grooming: with mod assist;sitting;bed level Pt Will Transfer to Toilet: with min assist;stand pivot transfer;ambulating;bedside commode Additional ADL Goal #1: Pt will complete bed mobility at min A level to prepare for EOB/OOB ADLs. Additional ADL Goal #2: Pt will static sit EOB for 5 minutes at min A level towards the goal of UB ADLs at EOB level.  OT Frequency: Min 2X/week   Barriers to D/C:            Co-evaluation              AM-PAC OT "6 Clicks" Daily Activity     Outcome Measure Help from another person eating meals?: Total Help from another person taking care of personal grooming?:  Total Help from another person toileting, which includes using toliet, bedpan, or urinal?: Total Help from another person bathing (including washing, rinsing, drying)?: Total Help from another person to put on and taking off regular upper body clothing?: Total Help from another person to put on and taking off regular lower body clothing?: Total 6 Click Score: 6   End of Session Nurse Communication: Other (comment) (bp readings)  Activity Tolerance: Other (comment);Patient limited by lethargy (high bp initially, orthostatic drop sitting EOB) Patient  left: in bed;with call bell/phone within reach;with bed alarm set;with restraints reapplied;with family/visitor present  OT Visit Diagnosis: Unsteadiness on feet (R26.81);Muscle weakness (generalized) (M62.81);Other abnormalities of gait and mobility (R26.89);Repeated falls (R29.6);History of falling (Z91.81);Other symptoms and signs involving the nervous system (R29.898);Other symptoms and signs involving cognitive function;Cognitive communication deficit (R41.841);Pain;Hemiplegia and hemiparesis                Time: 0920-0947 OT Time Calculation (min): 27 min Charges:  OT General Charges $OT Visit: 1 Visit OT Evaluation $OT Eval Moderate Complexity: 1 Mod OT Treatments $Therapeutic Activity: 8-22 mins  Tyrone Schimke, OT Acute Rehabilitation Services Pager: [phone] Office: [phone]   Hortencia Pilar 07/17/2020, 10:16 AM

## 2020-07-17 NOTE — NC FL2 (Signed)
[place] LEVEL OF CARE SCREENING TOOL     IDENTIFICATION  Patient Name: Mathew Miller. Birthdate: April 05, 1946 Sex: male Admission Date (Current Location): 07/15/2020  [facility] and [place] Number:  Herbalist and Address:  [facility], [place] [address], [place], [place] [address]      Provider Number: [id number]  Attending Physician Name and Address:  Edwin Dada, *  Relative Name and Phone Number:       Current Level of Care: Hospital Recommended Level of Care: [place] Prior Approval Number:    Date Approved/Denied:   PASRR Number: [id number]  Discharge Plan: SNF    Current Diagnoses: Patient Active Problem List   Diagnosis Date Noted  . CVA (cerebral vascular accident) ([place]) 07/15/2020  . Hypokalemia   . Left-sided weakness   . B12 deficiency 03/26/2020  . Unstable gait 10/01/2019  . Frequent falls 10/01/2019  . Imbalance 02/27/2018  . BMI 24.0-24.9, adult 02/26/2018  . Acid reflux 02/26/2018  . Memory changes 02/26/2018  . Vitamin D deficiency 03/01/2014  . Medication management 03/01/2014  . Hyperlipidemia   . History of prediabetes   . HTN (hypertension) 10/09/2013    Orientation RESPIRATION BLADDER Height & Weight     Self, Place  Normal Incontinent Weight: 172 lb (78 kg) Height:  6\' 2"  (188 cm)  BEHAVIORAL SYMPTOMS/MOOD NEUROLOGICAL BOWEL NUTRITION STATUS      Continent Diet (See DC Summary)  AMBULATORY STATUS COMMUNICATION OF NEEDS Skin   Limited Assist Verbally Normal                       Personal Care Assistance Level of Assistance  Bathing, Feeding, Dressing Bathing Assistance: Limited assistance Feeding assistance: Limited assistance Dressing Assistance: Limited assistance     Functional Limitations Info  Hearing   Hearing Info: Impaired      SPECIAL CARE FACTORS FREQUENCY  PT (By licensed PT), OT (By licensed OT)     PT Frequency: 5xweek OT  Frequency: 5xweek            Contractures Contractures Info: Not present    Additional Factors Info  Code Status, Allergies Code Status Info: Full Allergies Info: Hydrocodone-acetaminophen, sudafed pseudophedrine Hcl, Tetanus Toxoids           Current Medications (07/17/2020):  This is the current hospital active medication list Current Facility-Administered Medications  Medication Dose Route Frequency Provider Last Rate Last Admin  .  stroke: mapping our early stages of recovery book   Does not apply Once [place], Ping T, MD      . 0.9 % NaCl with KCl 40 mEq / L  infusion   Intravenous Continuous Edwin Dada, MD 75 mL/hr at 07/16/20 2210 New Bag at 07/16/20 2210  . aspirin EC tablet 81 mg  81 mg Oral Daily Wynetta Fines T, MD   81 mg at 07/16/20 0925  . cholecalciferol (VITAMIN D3) tablet 5,000 Units  5,000 Units Oral Daily Lequita Halt, MD   5,000 Units at 07/16/20 [phone]  . enoxaparin (LOVENOX) injection 40 mg  40 mg Subcutaneous Q24H Wynetta Fines T, MD   40 mg at 07/16/20 2021  . feeding supplement (ENSURE ENLIVE) (ENSURE ENLIVE) liquid 237 mL  237 mL Oral BID BM Danford, Suann Larry, MD   237 mL at 07/16/20 1506  . mirabegron ER (MYRBETRIQ) tablet 50 mg  50 mg Oral Daily Lequita Halt, MD  50 mg at 07/16/20 0957  . pantoprazole (PROTONIX) EC tablet 40 mg  40 mg Oral Daily Wynetta Fines T, MD   40 mg at 07/16/20 0923  . QUEtiapine (SEROQUEL) tablet 25 mg  25 mg Oral QHS Wynetta Fines T, MD   25 mg at 07/16/20 2021  . rosuvastatin (CRESTOR) tablet 20 mg  20 mg Oral Daily Wynetta Fines T, MD   20 mg at 07/16/20 0925  . senna-docusate (Senokot-S) tablet 1 tablet  1 tablet Oral Daily Lequita Halt, MD   1 tablet at 07/16/20 [phone]  . senna-docusate (Senokot-S) tablet 1 tablet  1 tablet Oral QHS PRN Lequita Halt, MD   1 tablet at 07/15/20 2120  . vitamin B-12 (CYANOCOBALAMIN) tablet 1,000 mcg  1,000 mcg Oral Daily Lequita Halt, MD   1,000 mcg at 07/16/20 3329     Discharge  Medications: Please see discharge summary for a list of discharge medications.  Relevant Imaging Results:  Relevant Lab Results:   Additional Information SS#: [id number]  Geralynn Ochs, LCSW

## 2020-07-17 NOTE — Progress Notes (Signed)
Carotid artery duplex has been completed. Preliminary results can be found in CV Proc through chart review.   07/17/20 11:44 AM Mathew Miller RVT

## 2020-07-17 NOTE — Progress Notes (Signed)
STROKE TEAM PROGRESS NOTE   HISTORY OF PRESENT ILLNESS (per record) Mathew Miller. is a 74 y.o. male with PMH significant for HTN, HLD, dementia, prediabetes, sensorineural hearing loss who presents with fall and left sided weakness with unknown LKW but at least several days ago. MRI Brain demonstrated 2 R frontal lobe white matter infarcts. MRA head with no LVO, carotid duplex is pending. Lipid panel with LDL of 69, HbA1c of 4.9. He endorses almost a fall 3 days ago when he ran into someone, also reports left sided weakness that he is not sure how long has been going on for but thinks that it has been there for about 2 weeks. He lives with his wife. Does not smoke. Does not think that he takes an antiplatelet at home. NIHSS: 2 Premorbid mRS: 2 TPA: no, outside the window Thrombectomy: no, outside the window.   INTERVAL HISTORY His  wife and Dr. Loleta Books are at the bedside.  I have personally reviewed history of presenting illness, electronic medical records and imaging films in PACS.  Presented with a fall and EMS noticed some left-sided weakness which has persisted.  Patient has history of orthostatic hypotension and frequent falls.  MRI scan shows right frontal white matter lacunar infarct and MRI of the brain shows no significant large vessel stenosis or occlusion.  Echocardiogram showed normal ejection fraction without cardiac source of embolism.  Carotid ultrasound is pending.  LDL cholesterol 69 mg percent.  Hemoglobin A1c is 4.9.  MRI scan cervical spine shows severe left-sided foraminal narrowing at C5-6 and C6-7.   OBJECTIVE Vitals:   07/17/20 0002 07/17/20 0025 07/17/20 0200 07/17/20 0304  BP: (!) 193/103 (!) 185/96 (!) 190/90 (!) 182/103  Pulse: 86 70 78 75  Resp:    18  Temp:    98.7 F (37.1 C)  TempSrc:    Oral  SpO2:    98%  Weight:      Height:        CBC:  Recent Labs  Lab 07/15/20 1316 07/15/20 1327  WBC 8.6  --   NEUTROABS 6.5  --   HGB 13.1 12.2*  HCT  39.1 36.0*  MCV 88.9  --   PLT 150  --     Basic Metabolic Panel:  Recent Labs  Lab 07/15/20 2116 07/15/20 2116 07/16/20 0952 07/16/20 1744  NA 145   < > 144 143  K 2.6*   < > 2.9* 3.1*  CL 104   < > 120* 108  CO2 31   < > 19* 24  GLUCOSE 103*   < > 101* 168*  BUN 18   < > 12 16  CREATININE 1.35*   < > 0.83 1.34*  CALCIUM 8.5*   < > 5.6* 8.9  MG 2.8*  --   --   --    < > = values in this interval not displayed.    Lipid Panel:     Component Value Date/Time   CHOL 130 07/16/2020 0340   TRIG 65 07/16/2020 0340   HDL 48 07/16/2020 0340   CHOLHDL 2.7 07/16/2020 0340   VLDL 13 07/16/2020 0340   LDLCALC 69 07/16/2020 0340   LDLCALC 65 03/27/2020 1248   HgbA1c:  Lab Results  Component Value Date   HGBA1C 4.9 07/16/2020   Urine Drug Screen: No results found for: LABOPIA, COCAINSCRNUR, LABBENZ, AMPHETMU, THCU, LABBARB  Alcohol Level No results found for: [facility]  IMAGING  DG Ribs Unilateral W/Chest Left 07/15/2020  IMPRESSION:  Negative.   DG Tibia/Fibula Left  07/15/2020 IMPRESSION:  Negative.   CT HEAD WO CONTRAST  07/15/2020 IMPRESSION:  No evidence of acute large vascular territory infarction, hemorrhage, hydrocephalus, extra-axial collection or mass lesion/mass effect. Similar appearance of a remote infarct in the right frontal white matter. Similar additional patchy white matter hypoattenuation, compatible with chronic microvascular ischemic disease.  CT Cervical Spine Wo Contrast 07/15/2020  IMPRESSION:  1. No evidence of acute fracture or traumatic malalignment.  2. Multilevel degenerative disc disease, greatest at C5-C6 and C6-C7.   MR BRAIN WO CONTRAST MR ANGIO HEAD WO CONTRAST 07/15/2020 IMPRESSION:  1. Acute white matter infarcts in the right frontal lobe.  2. Moderate chronic small vessel ischemic disease with multiple chronic lacunar infarcts, progressed from 2019.  3. No major intracranial arterial occlusion.  4. Mild-to-moderate right P2  stenosis.   MR CERVICAL SPINE WO CONTRAST 07/16/2020 IMPRESSION:  1. Severe left C5-6 and C6-7 neural foraminal stenosis secondary to foraminal disc osteophyte complexes.  2. Mild C4-5 spinal canal stenosis.  3. Mild T3 and T4 wedge compression fractures, age indeterminate. Correlate for recent trauma.   ECHOCARDIOGRAM COMPLETE 07/16/2020 IMPRESSIONS  1. Left ventricular ejection fraction, by estimation, is 60 to 65%. The left ventricle has normal function. The left ventricle has no regional wall motion abnormalities. There is mild left ventricular hypertrophy. Left ventricular diastolic parameters are consistent with Grade I diastolic dysfunction (impaired relaxation).   2. Right ventricular systolic function is normal. The right ventricular size is normal.   3. The mitral valve is normal in structure. Trivial mitral valve regurgitation. No evidence of mitral stenosis.   4. The aortic valve is tricuspid. Aortic valve regurgitation is not visualized. Mild to moderate aortic valve sclerosis/calcification is present, without any evidence of aortic stenosis.   5. The inferior vena cava is normal in size with greater than 50% respiratory variability, suggesting right atrial pressure of 3 mmHg.   Bilateral Carotid Dopplers  07/16/20 Summary:  Right Carotid: Velocities in the right ICA are consistent with a 1-39% stenosis.  Left Carotid: Velocities in the left ICA are consistent with a 1-39% stenosis.  Vertebrals: Bilateral vertebral arteries demonstrate antegrade flow.   ECG - SR rate 81 BPM. (See cardiology reading for complete details)  PHYSICAL EXAM Blood pressure (!) 182/103, pulse 75, temperature 98.7 F (37.1 C), temperature source Oral, resp. rate 18, height 6\' 2"  (1.88 m), weight 78 kg, SpO2 98 %. Frail elderly Caucasian male not in distress. . Afebrile. Head is nontraumatic. Neck is supple without bruit.    Cardiac exam no murmur or gallop. Lungs are clear to auscultation. Distal pulses  are well felt. Neurological Exam : He is awake alert oriented to person only.  Diminished attention, registration and recall.  Speech is slow nonfluent and slightly dysarthric.  Follows simple midline and one-step commands.  Motor system exam shows left hemiparesis with left upper extremity 3/5 strength in left lower extremity 4/5 strength.  Good strength on the right side.  Sensation was intact.  Tone is diminished in the left upper extremity.  Coordination impaired on the left.  Gait not tested.  ASSESSMENT/PLAN Mr. Mathew Miller. is a 74 y.o. male with history of HTN, HLD, dementia, prediabetes, sensorineural hearing loss who presents with a fall and left sided weakness with unknown LKW but at least several days ago. MRI Brain demonstrated 2 R frontal lobe white matter infarcts. He did not receive IV t-PA due to late presentation (>4.5  hours from time of onset).  Stroke: acute white matter infarcts in the right frontal lobe - embolic - unknown source.  Resultant left hemiparesis  Code Stroke CT Head - not ordered    CT head - no acute infarct  MRI head - Acute white matter infarcts in the right frontal lobe. Moderate chronic small vessel ischemic disease with multiple chronic lacunar infarcts, progressed from 2019.   MRA head - No major intracranial arterial occlusion. Mild-to-moderate right P2 stenosis.   CTA H&N - not ordered  CT Perfusion - not ordered  Carotid Doppler - unremarkable  2D Echo - EF 60 - 65%. No cardiac source of emboli identified.   Sars Corona Virus 2 - negative  LDL - 69  HgbA1c - 4.9  UDS - not ordered  VTE prophylaxis - Lovenox Diet  Diet Order            Diet Heart Room service appropriate? Yes; Fluid consistency: Thin  Diet effective now                  No antithrombotic prior to admission, now on aspirin 81 mg daily  Patient counseled to be compliant with his antithrombotic medications  Ongoing aggressive stroke risk factor  management  Therapy recommendations:  SNF  Disposition:  Pending  Hypertension  Home BP meds: none   Current BP meds: Losartan  Blood pressure somewhat high at times but within post stroke/TIA parameters  Permissive hypertension (OK if < 220/120) but gradually normalize in 5-7 days   Long-term BP goal normotensive  Hyperlipidemia  Home Lipid lowering medication: Crestor 5 mg daily  LDL 69, goal < 70  Current lipid lowering medication: Crestor 20 mg daily  Continue statin at discharge  Pre Diabetes  Home diabetic meds: none   Current diabetic meds: none  HgbA1c 4.9, goal < 7.0 Recent Labs    07/15/20 1317  GLUCAP 128*     Other Stroke Risk Factors  Advanced age  ETOH use, advised to drink no more than 1 alcoholic beverage per day.  Previous strokes by imaging  Other Active Problems  Code status - Full code  Hypokalemia - potassium - 1.34 CKD - stage 3b - creatinine - 3.1  Palliative care consult    Severe left C5-6 and C6-7 neural foraminal stenosis secondary to foraminal disc osteophyte complexes.   Mild anemia - Hgb - 12.2  Hospital day # 2 He presented with left hemiparesis due to right subcortical infarct likely from small vessel disease.  Recommend aspirin 81 mg and Plavix 75 mg daily for 3 weeks followed by aspirin alone.  Continue ongoing stroke work-up.  Risk factor modification.  Mobilize out of bed.  Therapy consults.  Consider using Mestinon or Northera for his orthostatic hypotension since is still symptomatic on midodrine and has supine hypertension.  Long discussion with patient and his wife and Dr. Loleta Books and answered questions.  Greater than 50% time during this 35-minute visit was spent on counseling and coordination of care about his lacunar stroke and orthostatic hypotension and discussion with care team and answering questions. Antony Contras, MD To contact Stroke Continuity provider, please refer to [email or web address] After hours, contact  General Neurology

## 2020-07-17 NOTE — Progress Notes (Signed)
Mathew Miller PROGRESS NOTE    Mathew Miller.  GXQ:[id number] DOB: [birth year] DOA: 07/15/2020 PCP: Unk Pinto, MD      Brief Narrative:  Mathew Miller is a 74 y.o. M with dementia, home dwelling, labile orthostatic hypotension due to dysautonomia complicated by recurrent syncope who presented with left-sided weakness and fall.  Several days ago, patient had sudden onset left-sided weakness in the arm, this did not improve, and on the day of admission the patient had a syncopal episode that was worse than usual, so he was brought to the ER.  In the ER, potassium 2.5, creatinine at baseline, CT head no acute changes.  Admitted for hypokalemia and stroke work-up.      Assessment & Plan:  Small vessel ischemic stroke Patient admitted and MRI brain showed 2 small right frontal lobe white matter infarcts, consistent with small vessel disease.  Cervical MRI showed some foraminal disease, which actually correlate with his left-sided arm weakness. -Non-invasive angiography no major intracranial arterial occlusion, scattered small vessel disease -Echocardiogram showed no cardiogenic source of embolism -Carotid imaging showed no stenosis -Lipids ordered, continue Crestor -Aspirin ordered at admission --> continue low-dose aspirin plus Plavix for 3 weeks followed by aspirin alone -Atrial fibrillation: Not present on telemetry -tPA not given because outside the window -Dysphagia screen ordered in ER -PT eval ordered   Dementia -Continue quetiapine  Hypokalemia Hypocalcemia Low K due to Florinef use Resolved  AKI Creatinine 1.59 on admission, improved to 0.8 baseline with IV fluids, now resolved.  Orthostatic hypotension This is a long-term problem.  Overnight he had significant supine hypertension. -Hold midodrine -Hold Florinef for now -Oral labetalol as needed for severe range pressures   GERD -Continue PPI  Bladder dysfunction -Continue  mirabegron   Degenerative spine disease MRI C-spine shows some foraminal narrowing at C5-7 level.  Not a candidate for spine surgery.         Disposition: Status is: Inpatient  Remains inpatient appropriate because:aggressive IV potassium supplementation needed, ongoing stroke work up and evaluation   Dispo: The patient is from: Home              Anticipated d/c is to: SNF              Anticipated d/c date is: 2 days              Patient currently is not medically stable to d/c.   The patient was admitted with a new stroke, he has new left-sided deficits, that impair his ability to function independently.  He remains unable to ambulate.  We will likely be able to complete his stroke work-up today, and anticipate discharge to SNF as soon as bed is available.              MDM: The below labs and imaging reports were reviewed and summarized above.  Medication management as above.    DVT prophylaxis: enoxaparin (LOVENOX) injection 40 mg Start: 07/15/20 2200  Code Status: Full code Family Communication: Wife at the bedside           Subjective: No new vomiting, headache, change in left-sided weakness.  No chest pain, dyspnea.  No fever.  No acute change in baseline confusion.  He has some expected delirium.  Objective: Vitals:   07/17/20 0304 07/17/20 0800 07/17/20 1200 07/17/20 1448  BP: (!) 182/103 (!) 183/102 (!) 178/96 (!) 178/94  Pulse: 75 70 79 75  Resp: 18 18 16 18   Temp: 98.7  F (37.1 C) 98.1 F (36.7 C) 98.2 F (36.8 C) 97.9 F (36.6 C)  TempSrc: Oral Axillary Axillary Axillary  SpO2: 98% 100% 98% 99%  Weight:      Height:        Intake/Output Summary (Last 24 hours) at 07/17/2020 1745 Last data filed at 07/17/2020 1652 Gross per 24 hour  Intake 1232.76 ml  Output 3050 ml  Net -1817.24 ml   Filed Weights   07/15/20 1316  Weight: 78 kg    Examination: General appearance: Elderly adult male, sitting in bed, no acute distress,  interactive but pleasantly confused HEENT: Anicteric, conjunctival pink, lids and lashes normal.  No nasal flaring, discharge, epistaxis, lips moist.   Skin: Skin warm and dry, no suspicious rashes or lesions Cardiac: Regular rate and rhythm, soft systolic murmur, no elevation in JVP, no lower extremity edema Respiratory: Lung sounds clear, normal respiratory effort. Abdomen: Abdomen soft no tenderness palpation or guarding, no ascites or distention.   MSK: Reduced muscle bulk and tone. Neuro: Awake and alert, moves all extremities, but mild left-sided weakness.  Speech fluent. Psych: Sensorium intact and makes eye contact, attention seems diminished, affect blunted, judgment insight severely impaired    Data Reviewed: I have personally reviewed following labs and imaging studies:  CBC: Recent Labs  Lab 07/15/20 1316 07/15/20 1327 07/17/20 0855  WBC 8.6  --  8.1  NEUTROABS 6.5  --   --   HGB 13.1 12.2* 12.0*  HCT 39.1 36.0* 36.2*  MCV 88.9  --  87.4  PLT 150  --  010*   Basic Metabolic Panel: Recent Labs  Lab 07/15/20 1316 07/15/20 1316 07/15/20 1327 07/15/20 2116 07/16/20 0952 07/16/20 1744 07/17/20 0855  NA 144   < > 145 145 144 143 144  K 2.5*   < > 2.5* 2.6* 2.9* 3.1* 3.4*  CL 104   < > 99 104 120* 108 108  CO2 31  --   --  31 19* 24 29  GLUCOSE 136*   < > 132* 103* 101* 168* 96  BUN 21   < > 26* 18 12 16 11   CREATININE 1.59*   < > 1.50* 1.35* 0.83 1.34* 1.10  CALCIUM 8.6*  --   --  8.5* 5.6* 8.9 8.7*  MG  --   --   --  2.8*  --   --   --    < > = values in this interval not displayed.   GFR: Estimated Creatinine Clearance: 63 mL/min (by C-G formula based on SCr of 1.1 mg/dL). Liver Function Tests: Recent Labs  Lab 07/15/20 1316  AST 27  ALT 20  ALKPHOS 81  BILITOT 1.1  PROT 5.8*  ALBUMIN 3.2*   No results for input(s): LIPASE, AMYLASE in the last 168 hours. No results for input(s): AMMONIA in the last 168 hours. Coagulation Profile: Recent Labs   Lab 07/15/20 1316  INR 1.0   Cardiac Enzymes: No results for input(s): CKTOTAL, CKMB, CKMBINDEX, TROPONINI in the last 168 hours. BNP (last 3 results) No results for input(s): PROBNP in the last 8760 hours. HbA1C: Recent Labs    07/16/20 0340  HGBA1C 4.9   CBG: Recent Labs  Lab 07/15/20 1317  GLUCAP 128*   Lipid Profile: Recent Labs    07/16/20 0340  CHOL 130  HDL 48  LDLCALC 69  TRIG 65  CHOLHDL 2.7   Thyroid Function Tests: No results for input(s): TSH, T4TOTAL, FREET4, T3FREE, THYROIDAB in the last  72 hours. Anemia Panel: No results for input(s): VITAMINB12, FOLATE, FERRITIN, TIBC, IRON, RETICCTPCT in the last 72 hours. Urine analysis:    Component Value Date/Time   COLORURINE STRAW (A) 07/15/2020 2116   APPEARANCEUR CLEAR 07/15/2020 2116   LABSPEC 1.010 07/15/2020 2116   PHURINE 7.0 07/15/2020 2116   GLUCOSEU NEGATIVE 07/15/2020 2116   HGBUR SMALL (A) 07/15/2020 2116   BILIRUBINUR NEGATIVE 07/15/2020 2116   KETONESUR NEGATIVE 07/15/2020 2116   PROTEINUR NEGATIVE 07/15/2020 2116   UROBILINOGEN 1 06/12/2014 1156   NITRITE NEGATIVE 07/15/2020 2116   LEUKOCYTESUR NEGATIVE 07/15/2020 2116   Sepsis Labs: @LABRCNTIP (procalcitonin:4,lacticacidven:4)  ) Recent Results (from the past 240 hour(s))  Respiratory Panel by RT PCR (Flu A&B, Covid) - Nasopharyngeal Swab     Status: None   Collection Time: 07/15/20  3:39 PM   Specimen: Nasopharyngeal Swab  Result Value Ref Range Status   SARS Coronavirus 2 by RT PCR NEGATIVE NEGATIVE Final    Comment: (NOTE) SARS-CoV-2 target nucleic acids are NOT DETECTED.  The SARS-CoV-2 RNA is generally detectable in upper respiratoy specimens during the acute phase of infection. The lowest concentration of SARS-CoV-2 viral copies this assay can detect is 131 copies/mL. A negative result does not preclude SARS-Cov-2 infection and should not be used as the sole basis for treatment or other patient management decisions. A  negative result may occur with  improper specimen collection/handling, submission of specimen other than nasopharyngeal swab, presence of viral mutation(s) within the areas targeted by this assay, and inadequate number of viral copies (<131 copies/mL). A negative result must be combined with clinical observations, patient history, and epidemiological information. The expected result is Negative.  Fact Sheet for Patients:  PinkCheek.be  Fact Sheet for Healthcare Providers:  GravelBags.it  This test is no t yet approved or cleared by the [place] FDA and  has been authorized for detection and/or diagnosis of SARS-CoV-2 by FDA under an Emergency Use Authorization (EUA). This EUA will remain  in effect (meaning this test can be used) for the duration of the COVID-19 declaration under Section 564(b)(1) of the Act, 21 U.S.C. section 360bbb-3(b)(1), unless the authorization is terminated or revoked sooner.     Influenza A by PCR NEGATIVE NEGATIVE Final   Influenza B by PCR NEGATIVE NEGATIVE Final    Comment: (NOTE) The Xpert Xpress SARS-CoV-2/FLU/RSV assay is intended as an aid in  the diagnosis of influenza from Nasopharyngeal swab specimens and  should not be used as a sole basis for treatment. Nasal washings and  aspirates are unacceptable for Xpert Xpress SARS-CoV-2/FLU/RSV  testing.  Fact Sheet for Patients: PinkCheek.be  Fact Sheet for Healthcare Providers: GravelBags.it  This test is not yet approved or cleared by the [place] FDA and  has been authorized for detection and/or diagnosis of SARS-CoV-2 by  FDA under an Emergency Use Authorization (EUA). This EUA will remain  in effect (meaning this test can be used) for the duration of the  Covid-19 declaration under Section 564(b)(1) of the Act, 21  U.S.C. section 360bbb-3(b)(1), unless the authorization  is  terminated or revoked. Performed at [facility], [place] [address] [place], [place] [address]          Radiology Studies: MR ANGIO HEAD WO CONTRAST  Result Date: 07/15/2020 CLINICAL DATA:  Left-sided weakness. EXAM: MRI HEAD WITHOUT CONTRAST MRA HEAD WITHOUT CONTRAST TECHNIQUE: Multiplanar, multiecho pulse sequences of the brain and surrounding structures were obtained without intravenous contrast. Angiographic images of the head were  obtained using MRA technique without contrast. COMPARISON:  Head CT 07/15/2020 and MRI 01/19/2018 FINDINGS: MRI HEAD FINDINGS Brain: There is a 1.5 cm acute white matter infarct in the posterior right frontal lobe at the level of the centrum semiovale and upper corona radiata, and there is also a punctate acute white matter infarct more anteriorly in the right frontal lobe in the centrum semiovale. There is an increased number of chronic microhemorrhages scattered both peripherally in both cerebral hemispheres as well as in the left thalamus and basal ganglia compared to the prior MRI which may be related to chronic hypertension. T2 hyperintensities in the cerebral white matter bilaterally and in the pons have progressed from the prior MRI and are nonspecific but compatible with moderate chronic small vessel ischemic disease. There is an increased number of chronic lacunar infarcts in the cerebral white matter bilaterally. Chronic infarcts are also noted in the thalami and body of the corpus callosum. No mass, midline shift, or extra-axial fluid collection is identified. There is mild cerebral atrophy. Vascular: Major intracranial vascular flow voids are preserved. Skull and upper cervical spine: Unremarkable bone marrow signal. Sinuses/Orbits: Bilateral cataract extraction. Paranasal sinuses and mastoid air cells are clear. Other: None. MRA HEAD FINDINGS The visualized distal vertebral arteries are widely patent to the basilar with the left being slightly  dominant. Patent bilateral PICA, right AICA, and bilateral SCA origins are identified. The basilar artery is widely patent. Posterior communicating arteries are diminutive or absent. Both PCAs are patent without evidence of a significant proximal stenosis on the left. There is a mild-to-moderate proximal right P2 stenosis. The internal carotid arteries are widely patent from skull base to carotid termini. ACAs and MCAs are patent without evidence of a proximal branch occlusion or significant proximal stenosis. No aneurysm is identified. IMPRESSION: 1. Acute white matter infarcts in the right frontal lobe. 2. Moderate chronic small vessel ischemic disease with multiple chronic lacunar infarcts, progressed from 2019. 3. No major intracranial arterial occlusion. 4. Mild-to-moderate right P2 stenosis. Electronically Signed   By: Logan Bores M.D.   On: 07/15/2020 19:45   MR BRAIN WO CONTRAST  Result Date: 07/15/2020 CLINICAL DATA:  Left-sided weakness. EXAM: MRI HEAD WITHOUT CONTRAST MRA HEAD WITHOUT CONTRAST TECHNIQUE: Multiplanar, multiecho pulse sequences of the brain and surrounding structures were obtained without intravenous contrast. Angiographic images of the head were obtained using MRA technique without contrast. COMPARISON:  Head CT 07/15/2020 and MRI 01/19/2018 FINDINGS: MRI HEAD FINDINGS Brain: There is a 1.5 cm acute white matter infarct in the posterior right frontal lobe at the level of the centrum semiovale and upper corona radiata, and there is also a punctate acute white matter infarct more anteriorly in the right frontal lobe in the centrum semiovale. There is an increased number of chronic microhemorrhages scattered both peripherally in both cerebral hemispheres as well as in the left thalamus and basal ganglia compared to the prior MRI which may be related to chronic hypertension. T2 hyperintensities in the cerebral white matter bilaterally and in the pons have progressed from the prior MRI and  are nonspecific but compatible with moderate chronic small vessel ischemic disease. There is an increased number of chronic lacunar infarcts in the cerebral white matter bilaterally. Chronic infarcts are also noted in the thalami and body of the corpus callosum. No mass, midline shift, or extra-axial fluid collection is identified. There is mild cerebral atrophy. Vascular: Major intracranial vascular flow voids are preserved. Skull and upper cervical spine: Unremarkable bone marrow signal.  Sinuses/Orbits: Bilateral cataract extraction. Paranasal sinuses and mastoid air cells are clear. Other: None. MRA HEAD FINDINGS The visualized distal vertebral arteries are widely patent to the basilar with the left being slightly dominant. Patent bilateral PICA, right AICA, and bilateral SCA origins are identified. The basilar artery is widely patent. Posterior communicating arteries are diminutive or absent. Both PCAs are patent without evidence of a significant proximal stenosis on the left. There is a mild-to-moderate proximal right P2 stenosis. The internal carotid arteries are widely patent from skull base to carotid termini. ACAs and MCAs are patent without evidence of a proximal branch occlusion or significant proximal stenosis. No aneurysm is identified. IMPRESSION: 1. Acute white matter infarcts in the right frontal lobe. 2. Moderate chronic small vessel ischemic disease with multiple chronic lacunar infarcts, progressed from 2019. 3. No major intracranial arterial occlusion. 4. Mild-to-moderate right P2 stenosis. Electronically Signed   By: Logan Bores M.D.   On: 07/15/2020 19:45   MR CERVICAL SPINE WO CONTRAST  Result Date: 07/16/2020 CLINICAL DATA:  Hyper-reflexia with chronic neck pain. EXAM: MRI CERVICAL SPINE WITHOUT CONTRAST TECHNIQUE: Multiplanar, multisequence MR imaging of the cervical spine was performed. No intravenous contrast was administered. COMPARISON:  None. FINDINGS: Alignment: Normal Vertebrae:  Mild wedge compression fractures of T3 and T4 with mild superior endplate edema and approximately 10% height loss. No retropulsion. No other bone marrow abnormality. Cord: Normal Posterior Fossa, vertebral arteries, paraspinal tissues: Negative Disc levels: C1-2: Unremarkable. C2-3: Disc desiccation without herniation . No spinal canal stenosis. No neural foraminal stenosis. C3-4: Disc desiccation without herniation. There is no spinal canal stenosis. No neural foraminal stenosis. C4-5: Small central disc protrusion with mild facet hypertrophy bilaterally. Mild spinal canal stenosis. No neural foraminal stenosis. C5-6: Left foraminal disc osteophyte complex. There is no spinal canal stenosis. Severe left neural foraminal stenosis. C6-7: Left foraminal disc osteophyte complex. There is no spinal canal stenosis. Severe left neural foraminal stenosis. C7-T1: Normal disc space and facet joints. There is no spinal canal stenosis. No neural foraminal stenosis. IMPRESSION: 1. Severe left C5-6 and C6-7 neural foraminal stenosis secondary to foraminal disc osteophyte complexes. 2. Mild C4-5 spinal canal stenosis. 3. Mild T3 and T4 wedge compression fractures, age indeterminate. Correlate for recent trauma. Electronically Signed   By: Ulyses Jarred M.D.   On: 07/16/2020 23:03   ECHOCARDIOGRAM COMPLETE  Result Date: 07/16/2020    ECHOCARDIOGRAM REPORT   Patient Name:   Mathew Miller. Date of Exam: 07/16/2020 Medical Rec #:  [id number]           Height:       74.0 in Accession #:    [id number]          Weight:       172.0 lb Date of Birth:  11-Jun-1946           BSA:          2.039 m Patient Age:    28 years            BP:           170/92 mmHg Patient Gender: M                   HR:           80 bpm. Exam Location:  Inpatient Procedure: 2D Echo Indications:    TIA 435.9  History:        Patient has prior history of Echocardiogram examinations, most  recent 04/10/2019. Chronic kidney disease,                  Signs/Symptoms:Syncope; Risk Factors:Hypertension and                 Dyslipidemia.  Sonographer:    Johny Chess Referring Phys: [id number] [place]  1. Left ventricular ejection fraction, by estimation, is 60 to 65%. The left ventricle has normal function. The left ventricle has no regional wall motion abnormalities. There is mild left ventricular hypertrophy. Left ventricular diastolic parameters are consistent with Grade I diastolic dysfunction (impaired relaxation).  2. Right ventricular systolic function is normal. The right ventricular size is normal.  3. The mitral valve is normal in structure. Trivial mitral valve regurgitation. No evidence of mitral stenosis.  4. The aortic valve is tricuspid. Aortic valve regurgitation is not visualized. Mild to moderate aortic valve sclerosis/calcification is present, without any evidence of aortic stenosis.  5. The inferior vena cava is normal in size with greater than 50% respiratory variability, suggesting right atrial pressure of 3 mmHg. Comparison(s): No significant change from prior study. Prior images reviewed side by side. Conclusion(s)/Recommendation(s): No intracardiac source of embolism detected on this transthoracic study. A transesophageal echocardiogram is recommended to exclude cardiac source of embolism if clinically indicated. FINDINGS  Left Ventricle: Left ventricular ejection fraction, by estimation, is 60 to 65%. The left ventricle has normal function. The left ventricle has no regional wall motion abnormalities. The left ventricular internal cavity size was normal in size. There is  mild left ventricular hypertrophy. Left ventricular diastolic parameters are consistent with Grade I diastolic dysfunction (impaired relaxation). Right Ventricle: The right ventricular size is normal. No increase in right ventricular wall thickness. Right ventricular systolic function is normal. Left Atrium: Left atrial size was normal in size. Right  Atrium: Right atrial size was normal in size. Pericardium: There is no evidence of pericardial effusion. Mitral Valve: The mitral valve is normal in structure. Trivial mitral valve regurgitation. No evidence of mitral valve stenosis. Tricuspid Valve: The tricuspid valve is normal in structure. Tricuspid valve regurgitation is not demonstrated. No evidence of tricuspid stenosis. Aortic Valve: The aortic valve is tricuspid. Aortic valve regurgitation is not visualized. Mild to moderate aortic valve sclerosis/calcification is present, without any evidence of aortic stenosis. Pulmonic Valve: The pulmonic valve was normal in structure. Pulmonic valve regurgitation is trivial. No evidence of pulmonic stenosis. Aorta: The aortic root is normal in size and structure. Venous: The inferior vena cava is normal in size with greater than 50% respiratory variability, suggesting right atrial pressure of 3 mmHg. IAS/Shunts: No atrial level shunt detected by color flow Doppler.  LEFT VENTRICLE PLAX 2D LVIDd:         4.30 cm  Diastology LVIDs:         3.00 cm  LV e' medial:    6.53 cm/s LV PW:         1.10 cm  LV E/e' medial:  13.7 LV IVS:        1.20 cm  LV e' lateral:   7.18 cm/s LVOT diam:     2.00 cm  LV E/e' lateral: 12.4 LV SV:         118 LV SV Index:   58 LVOT Area:     3.14 cm  RIGHT VENTRICLE             IVC RV S prime:     20.00 cm/s  IVC diam: 1.50 cm  TAPSE (M-mode): 2.8 cm LEFT ATRIUM             Index       RIGHT ATRIUM           Index LA diam:        4.00 cm 1.96 cm/m  RA Area:     12.00 cm LA Vol (A2C):   53.4 ml 26.20 ml/m RA Volume:   23.80 ml  11.68 ml/m LA Vol (A4C):   59.8 ml 29.33 ml/m LA Biplane Vol: 59.2 ml 29.04 ml/m  AORTIC VALVE LVOT Vmax:   149.00 cm/s LVOT Vmean:  113.000 cm/s LVOT VTI:    0.375 m  AORTA Ao Root diam: 3.30 cm Ao Asc diam:  3.40 cm MITRAL VALVE MV Area (PHT): 3.65 cm     SHUNTS MV Decel Time: 208 msec     Systemic VTI:  0.38 m MV E velocity: 89.30 cm/s   Systemic Diam: 2.00 cm MV A  velocity: 111.00 cm/s MV E/A ratio:  0.80 Candee Furbish MD Electronically signed by Candee Furbish MD Signature Date/Time: 07/16/2020/4:39:26 PM    Final    VAS US CAROTID  Result Date: 07/17/2020 Carotid Arterial Duplex Study Indications:       CVA. Risk Factors:      Hypertension, hyperlipidemia. Limitations        Today's exam was limited due to the patient's inability or                    unwillingness to cooperate, the patient's respiratory                    variation and constant patient movement. Comparison Study:  No prior studies. Performing Technologist: Oliver Hum RVT  Examination Guidelines: A complete evaluation includes B-mode imaging, spectral Doppler, color Doppler, and power Doppler as needed of all accessible portions of each vessel. Bilateral testing is considered an integral part of a complete examination. Limited examinations for reoccurring indications may be performed as noted.  Right Carotid Findings: +----------+--------+-------+--------+--------------------------------+--------+           PSV cm/sEDV    StenosisPlaque Description              Comments                   cm/s                                                    +----------+--------+-------+--------+--------------------------------+--------+ CCA Prox  50      10             smooth and heterogenous                  +----------+--------+-------+--------+--------------------------------+--------+ CCA Distal49      11             smooth and heterogenous                  +----------+--------+-------+--------+--------------------------------+--------+ ICA Prox  53      13             calcific, smooth and  heterogenous                             +----------+--------+-------+--------+--------------------------------+--------+ ICA Distal83      15                                             tortuous  +----------+--------+-------+--------+--------------------------------+--------+ ECA       72      12                                                      +----------+--------+-------+--------+--------------------------------+--------+ +----------+--------+-------+--------+-------------------+           PSV cm/sEDV cmsDescribeArm Pressure (mmHG) +----------+--------+-------+--------+-------------------+ [id number]                                        +----------+--------+-------+--------+-------------------+ +---------+--------+--+--------+-+---------+ VertebralPSV cm/s26EDV cm/s6Antegrade +---------+--------+--+--------+-+---------+  Left Carotid Findings: +----------+--------+--------+--------+-----------------------+--------+           PSV cm/sEDV cm/sStenosisPlaque Description     Comments +----------+--------+--------+--------+-----------------------+--------+ CCA Prox  54      9               smooth and heterogenous         +----------+--------+--------+--------+-----------------------+--------+ CCA Distal68      19              smooth and heterogenous         +----------+--------+--------+--------+-----------------------+--------+ ICA Prox  53      9               smooth and heterogenous         +----------+--------+--------+--------+-----------------------+--------+ ICA Distal63      19                                     tortuous +----------+--------+--------+--------+-----------------------+--------+ ECA       93      16                                              +----------+--------+--------+--------+-----------------------+--------+ +----------+--------+--------+--------+-------------------+           PSV cm/sEDV cm/sDescribeArm Pressure (mmHG) +----------+--------+--------+--------+-------------------+ [id number]                                          +----------+--------+--------+--------+-------------------+  +---------+--------+--+--------+-+---------+ VertebralPSV cm/s34EDV cm/s6Antegrade +---------+--------+--+--------+-+---------+   Summary: Right Carotid: Velocities in the right ICA are consistent with a 1-39% stenosis. Left Carotid: Velocities in the left ICA are consistent with a 1-39% stenosis. Vertebrals: Bilateral vertebral arteries demonstrate antegrade flow. *See table(s) above for measurements and observations.  Electronically signed by Antony Contras MD on 07/17/2020 at 12:31:42 PM.    Final         Scheduled Meds: .  stroke: mapping our early stages of recovery book   Does  not apply Once  . aspirin EC  81 mg Oral Daily  . cholecalciferol  5,000 Units Oral Daily  . clopidogrel  75 mg Oral Daily  . enoxaparin (LOVENOX) injection  40 mg Subcutaneous Q24H  . feeding supplement (ENSURE ENLIVE)  237 mL Oral TID BM  . mirabegron ER  50 mg Oral Daily  . multivitamin with minerals  1 tablet Oral Daily  . pantoprazole  40 mg Oral Daily  . polyethylene glycol  17 g Oral Daily  . QUEtiapine  25 mg Oral QHS  . rosuvastatin  20 mg Oral Daily  . senna-docusate  1 tablet Oral Daily  . senna-docusate  1 tablet Oral QHS  . vitamin B-12  1,000 mcg Oral Daily   Continuous Infusions:    LOS: 2 days    Time spent: 25 minutes     Edwin Dada, MD [facility] 07/17/2020, 5:45 PM     Please page though [place] or Epic secure chat:  For [employer], Adult nurse

## 2020-07-17 NOTE — Progress Notes (Signed)
Initial Nutrition Assessment  DOCUMENTATION CODES:   Not applicable  INTERVENTION:   Please obtain updated weight.   -Ensure Enlive po TID, each supplement provides 350 kcal and 20 grams of protein -Magic cup TID with meals, each supplement provides 290 kcal and 9 grams of protein -MVI daily  NUTRITION DIAGNOSIS:   Inadequate oral intake related to lethargy/confusion as evidenced by per patient/family report.    GOAL:   Patient will meet greater than or equal to 90% of their needs    MONITOR:   PO intake, Supplement acceptance, Labs, Weight trends, I & O's  REASON FOR ASSESSMENT:   Malnutrition Screening Tool    ASSESSMENT:   Pt admitted with scattered R frontal lobe white matter infarcts. PMH includes orthostatic hypotension, syncope, dementia, HTN, HLD, CKD stage 2, chronic hypokalemia, and prediabetes.  Pt sleeping at time of RD visit. Pt's wife at bedside to provide history. Per pt's wife, pt eats 3 balanced meals per day at baseline. Pt typically has a good appetite, but pt's wife has noticed that the pt has been having decreased portions over the last 6 months. Pt's wife feels this has corresponded with a decreased amount of movement in the pt's life. Pt's wife also reported that pt has lost ~30 lbs over the last 6 months, likely due to the decreased movement and intake. Wt readings do not reflect this change; however, suspect most recent wt is estimated and not reflective of pt's true weight. Recommend obtaining new weight. Suspect some degree of malnutrition given decreased intake, reported wt loss, and h/o dementia; however, unable to diagnose at this time. Will attempt NFPE at follow-up.   No PO intake documented. Observed 2 untouched meal trays in pt's room. Per pt's wife, he has been too lethargic to eat so far today. Pt with orders for Ensure Enlive BID, but has not yet received one per pt's wife. Pt's wife agreeable to use of oral nutrition supplements.    Labs: K+ 3.1 (L) Medications: Vitamin D3, Protonix, Senokot-S, Vitamin B12 IVF: NS w/ 52mEq/L KCl @ 78ml/hr  NUTRITION - FOCUSED PHYSICAL EXAM:  Deferred; pt sleeping and pt's wife wanted him to be able to rest  Diet Order:   Diet Order            Diet Heart Room service appropriate? Yes; Fluid consistency: Thin  Diet effective now                 EDUCATION NEEDS:   Not appropriate for education at this time  Skin:  Skin Assessment: Reviewed RN Assessment  Last BM:  9/26  Height:   Ht Readings from Last 1 Encounters:  07/15/20 6\' 2"  (1.88 m)    Weight:   Wt Readings from Last 5 Encounters:  07/15/20 78 kg  03/27/20 73.9 kg  03/13/20 73.6 kg  12/20/19 76.8 kg  10/18/19 77.8 kg    BMI:  Body mass index is 22.08 kg/m.  Estimated Nutritional Needs:   Kcal:  2050-2250  Protein:  105-115 grams  Fluid:  >/=2L/d    Larkin Ina, MS, RD, LDN RD pager number and weekend/on-call pager number located in [place].

## 2020-07-17 NOTE — Progress Notes (Signed)
Physical Therapy Treatment Patient Details Name: Mathew Miller. MRN: [id number] DOB: 05-23-1946 Today's Date: 07/17/2020    History of Present Illness Gayland Siverling. is a 74 y.o. male with medical history significant of orthostatic hypotension, frequent recurrent near syncope and syncope, vascular dementia, HTN, HLD, CKD stage II, chronic hypokalemia on supplement, prediabetes, presented with persistent left-sided weakness.  Had another episode of near syncope and fell on L side at home.  MRI positive for Acute white matter infarcts in the right frontal lobe.    PT Comments    Pt was quick to awaken from a nap.  Pt was slow to follow instruction.  Emphasis on transition to EOB, sitting balance, sit to stand, pre-gait prelude to gait, short distance gait trial in the RW.    Follow Up Recommendations  SNF     Equipment Recommendations  Other (comment);Wheelchair (measurements PT) (TBA)    Recommendations for Other Services       Precautions / Restrictions Precautions Precautions: Fall Precaution Comments: orthostatic hypotension; multiple falls at home    Mobility  Bed Mobility Overal bed mobility: Needs Assistance Bed Mobility: Supine to Sit;Sit to Supine     Supine to sit: Mod assist;HOB elevated Sit to supine: Mod assist   General bed mobility comments: decreased initiation. assist to advance BLE off bed and powerup trunk. Utilized bed pad to pivot hips. Assist to guide descent of trunk and to advance BLE onto bed at end of session  Transfers Overall transfer level: Needs assistance Equipment used: Rolling walker (2 wheeled) Transfers: Sit to/from Stand Sit to Stand: Mod assist         General transfer comment: assist to come forward and boost  Ambulation/Gait Ambulation/Gait assistance: Mod assist;Max assist Gait Distance (Feet): 3 Feet (then 6 feet toware the sink and back to the bed) Assistive device: Rolling walker (2 wheeled) Gait  Pattern/deviations: Step-to pattern;Trunk flexed;Leaning posteriorly   Gait velocity interpretation: <1.31 ft/sec, indicative of household ambulator General Gait Details: slow, unsteady, uncoordinated gait with need to help maneuver the RW.  Turns and backing up produced a parkinson's like locking up/halting of his gait pattern   Stairs             Wheelchair Mobility    Modified Rankin (Stroke Patients Only) Modified Rankin (Stroke Patients Only) Pre-Morbid Rankin Score: Moderately severe disability Modified Rankin: Moderately severe disability     Balance Overall balance assessment: Needs assistance   Sitting balance-Leahy Scale: Poor Sitting balance - Comments: pt sat without assist for a short period then needed assist as he fatigued.   Standing balance support: Bilateral upper extremity supported Standing balance-Leahy Scale: Poor Standing balance comment: reliant on external support                            Cognition Arousal/Alertness: Lethargic;Suspect due to medications (more alert sitting EOB) Behavior During Therapy: Flat affect Overall Cognitive Status: History of cognitive impairments - at baseline                                 General Comments: dementia at baseline      Exercises      General Comments General comments (skin integrity, edema, etc.): pt responded well to changes in position after he spent a good period of time in sitting.      Pertinent Vitals/Pain Faces Pain Scale:  No hurt Pain Intervention(s): Monitored during session    Home Living                      Prior Function            PT Goals (current goals can now be found in the care plan section) Acute Rehab PT Goals Patient Stated Goal: per wife to be able to walk, get up steps, though she is working to get a ramp PT Goal Formulation: With patient/family Time For Goal Achievement: 07/30/20 Potential to Achieve Goals: Good Progress  towards PT goals: Progressing toward goals    Frequency    Min 3X/week      PT Plan Current plan remains appropriate    Co-evaluation              AM-PAC PT "6 Clicks" Mobility   Outcome Measure  Help needed turning from your back to your side while in a flat bed without using bedrails?: A Little Help needed moving from lying on your back to sitting on the side of a flat bed without using bedrails?: A Lot Help needed moving to and from a bed to a chair (including a wheelchair)?: Total Help needed standing up from a chair using your arms (e.g., wheelchair or bedside chair)?: Total Help needed to walk in hospital room?: Total Help needed climbing 3-5 steps with a railing? : Total 6 Click Score: 9    End of Session   Activity Tolerance: Patient tolerated treatment well;Patient limited by fatigue Patient left: in bed;with call bell/phone within reach;with family/visitor present Nurse Communication: Other (comment) PT Visit Diagnosis: Other abnormalities of gait and mobility (R26.89);History of falling (Z91.81);Other symptoms and signs involving the nervous system (R29.898)     Time: 1914-7829 PT Time Calculation (min) (ACUTE ONLY): 23 min  Charges:  $Gait Training: 8-22 mins $Therapeutic Activity: 8-22 mins                     07/17/2020  Jacinto Halim., PT Acute Rehabilitation Services [phone]  (pager) [phone]  (office)   Eliseo Gum Arbie Reisz 07/17/2020, 6:54 PM

## 2020-07-17 NOTE — TOC Initial Note (Signed)
Transition of Care (TOC) - Initial/Assessment Note    Patient Details  Name: Mathew Miller. MRN: [id number] Date of Birth: 1946-07-17  Transition of [facility]) CM/SW Contact:    Geralynn Ochs, [place] Phone Number: 07/17/2020, 12:36 PM  Clinical Narrative:     Per chart review, patient's wife asking about [facility] for SNF. CSW completed referral and sent to [facility] for review, but they have denied due to the patient's needs. CSW met with wife at bedside to update her and discuss other bed offers. Wife chose [facility]. CSW contacted [facility] to confirm bed availability, and ask them to initiate insurance request through [place]. CSW to follow.              Expected Discharge Plan: Skilled Nursing Facility Barriers to Discharge: Insurance Authorization, Continued Medical Work up   Patient Goals and CMS Choice Patient states their goals for this hospitalization and ongoing recovery are:: patient unable to participate in goal setting due to disorientation CMS Medicare.gov Compare Post Acute Care list provided to:: Patient Represenative (must comment) Choice offered to / list presented to : Spouse  Expected Discharge Plan and Services Expected Discharge Plan: [place] Acute Care Choice: [place] arrangements for the past 2 months: Single Family Home                                      Prior Living Arrangements/Services Living arrangements for the past 2 months: Single Family Home Lives with:: Spouse Patient language and need for interpreter reviewed:: No Do you feel safe going back to the place where you live?: Yes      Need for Family Participation in Patient Care: Yes (Comment) Care giver support system in place?: No (comment)   Criminal Activity/Legal Involvement Pertinent to Current Situation/Hospitalization: No - Comment as needed  Activities of Daily Living Home Assistive Devices/Equipment: Cane (specify quad or  straight) ADL Screening (condition at time of admission) Patient's cognitive ability adequate to safely complete daily activities?: No Is the patient deaf or have difficulty hearing?: Yes Does the patient have difficulty seeing, even when wearing glasses/contacts?: No Does the patient have difficulty concentrating, remembering, or making decisions?: Yes Patient able to express need for assistance with ADLs?: Yes Does the patient have difficulty dressing or bathing?: Yes Independently performs ADLs?: No Dressing (OT): Needs assistance Is this a change from baseline?: Pre-admission baseline Grooming: Needs assistance Is this a change from baseline?: Pre-admission baseline Feeding: Needs assistance Is this a change from baseline?: Pre-admission baseline Does the patient have difficulty walking or climbing stairs?: Yes Weakness of Legs: Both Weakness of Arms/Hands: Both  Permission Sought/Granted Permission sought to share information with : Facility Sport and exercise psychologist, Family Supports Permission granted to share information with : Yes, Verbal Permission Granted  Share Information with NAME: [name]  Permission granted to share info w AGENCY: SNF  Permission granted to share info w Relationship: Wife     Emotional Assessment   Attitude/Demeanor/Rapport: Unable to Assess Affect (typically observed): Unable to Assess Orientation: : Oriented to Self Alcohol / Substance Use: Not Applicable Psych Involvement: No (comment)  Admission diagnosis:  Hypokalemia [E87.6] CVA (cerebral vascular accident) ([place]) [I63.9] Left-sided weakness [R53.1] Patient Active Problem List   Diagnosis Date Noted  . CVA (cerebral vascular accident) ([place]) 07/15/2020  . Hypokalemia   . Left-sided weakness   . B12 deficiency  03/26/2020  . Unstable gait 10/01/2019  . Frequent falls 10/01/2019  . Imbalance 02/27/2018  . BMI 24.0-24.9, adult 02/26/2018  . Acid reflux 02/26/2018  . Memory changes  02/26/2018  . Vitamin D deficiency 03/01/2014  . Medication management 03/01/2014  . Hyperlipidemia   . History of prediabetes   . HTN (hypertension) 10/09/2013   PCP:  Unk Pinto, MD Pharmacy:   [facility], [address] [place] [address] Phone: [phone] Fax: [phone]     Social Determinants of Health (SDOH) Interventions    Readmission Risk Interventions No flowsheet data found.

## 2020-07-18 DIAGNOSIS — I639 Cerebral infarction, unspecified: Secondary | ICD-10-CM

## 2020-07-18 DIAGNOSIS — E785 Hyperlipidemia, unspecified: Secondary | ICD-10-CM | POA: Diagnosis not present

## 2020-07-18 DIAGNOSIS — R0989 Other specified symptoms and signs involving the circulatory and respiratory systems: Secondary | ICD-10-CM | POA: Diagnosis not present

## 2020-07-18 DIAGNOSIS — U071 COVID-19: Secondary | ICD-10-CM | POA: Diagnosis not present

## 2020-07-18 DIAGNOSIS — F0390 Unspecified dementia without behavioral disturbance: Secondary | ICD-10-CM | POA: Diagnosis not present

## 2020-07-18 DIAGNOSIS — I10 Essential (primary) hypertension: Secondary | ICD-10-CM | POA: Diagnosis not present

## 2020-07-18 DIAGNOSIS — G8194 Hemiplegia, unspecified affecting left nondominant side: Secondary | ICD-10-CM | POA: Diagnosis not present

## 2020-07-18 DIAGNOSIS — Z79899 Other long term (current) drug therapy: Secondary | ICD-10-CM | POA: Diagnosis not present

## 2020-07-18 DIAGNOSIS — Z515 Encounter for palliative care: Secondary | ICD-10-CM

## 2020-07-18 DIAGNOSIS — I6782 Cerebral ischemia: Secondary | ICD-10-CM | POA: Diagnosis not present

## 2020-07-18 DIAGNOSIS — R55 Syncope and collapse: Secondary | ICD-10-CM | POA: Diagnosis not present

## 2020-07-18 DIAGNOSIS — K219 Gastro-esophageal reflux disease without esophagitis: Secondary | ICD-10-CM | POA: Diagnosis not present

## 2020-07-18 DIAGNOSIS — F0150 Vascular dementia without behavioral disturbance: Secondary | ICD-10-CM | POA: Diagnosis not present

## 2020-07-18 DIAGNOSIS — M479 Spondylosis, unspecified: Secondary | ICD-10-CM | POA: Diagnosis not present

## 2020-07-18 DIAGNOSIS — N183 Chronic kidney disease, stage 3 unspecified: Secondary | ICD-10-CM | POA: Diagnosis not present

## 2020-07-18 DIAGNOSIS — F0151 Vascular dementia with behavioral disturbance: Secondary | ICD-10-CM | POA: Diagnosis not present

## 2020-07-18 DIAGNOSIS — F0280 Dementia in other diseases classified elsewhere without behavioral disturbance: Secondary | ICD-10-CM | POA: Diagnosis not present

## 2020-07-18 DIAGNOSIS — M4802 Spinal stenosis, cervical region: Secondary | ICD-10-CM | POA: Diagnosis not present

## 2020-07-18 DIAGNOSIS — Z043 Encounter for examination and observation following other accident: Secondary | ICD-10-CM | POA: Diagnosis not present

## 2020-07-18 DIAGNOSIS — E782 Mixed hyperlipidemia: Secondary | ICD-10-CM | POA: Diagnosis not present

## 2020-07-18 DIAGNOSIS — R4182 Altered mental status, unspecified: Secondary | ICD-10-CM | POA: Diagnosis not present

## 2020-07-18 DIAGNOSIS — Z7401 Bed confinement status: Secondary | ICD-10-CM | POA: Diagnosis not present

## 2020-07-18 DIAGNOSIS — Z9181 History of falling: Secondary | ICD-10-CM | POA: Diagnosis not present

## 2020-07-18 DIAGNOSIS — E1169 Type 2 diabetes mellitus with other specified complication: Secondary | ICD-10-CM | POA: Diagnosis not present

## 2020-07-18 DIAGNOSIS — N3281 Overactive bladder: Secondary | ICD-10-CM | POA: Diagnosis not present

## 2020-07-18 DIAGNOSIS — R531 Weakness: Secondary | ICD-10-CM | POA: Diagnosis not present

## 2020-07-18 DIAGNOSIS — Z029 Encounter for administrative examinations, unspecified: Secondary | ICD-10-CM | POA: Diagnosis not present

## 2020-07-18 DIAGNOSIS — G901 Familial dysautonomia [Riley-Day]: Secondary | ICD-10-CM | POA: Diagnosis not present

## 2020-07-18 DIAGNOSIS — R2681 Unsteadiness on feet: Secondary | ICD-10-CM | POA: Diagnosis not present

## 2020-07-18 DIAGNOSIS — E876 Hypokalemia: Secondary | ICD-10-CM | POA: Diagnosis not present

## 2020-07-18 DIAGNOSIS — R296 Repeated falls: Secondary | ICD-10-CM | POA: Diagnosis not present

## 2020-07-18 DIAGNOSIS — R4181 Age-related cognitive decline: Secondary | ICD-10-CM | POA: Diagnosis not present

## 2020-07-18 DIAGNOSIS — R278 Other lack of coordination: Secondary | ICD-10-CM | POA: Diagnosis not present

## 2020-07-18 DIAGNOSIS — R29898 Other symptoms and signs involving the musculoskeletal system: Secondary | ICD-10-CM | POA: Diagnosis not present

## 2020-07-18 DIAGNOSIS — Z7189 Other specified counseling: Secondary | ICD-10-CM

## 2020-07-18 DIAGNOSIS — R2689 Other abnormalities of gait and mobility: Secondary | ICD-10-CM | POA: Diagnosis not present

## 2020-07-18 DIAGNOSIS — M2550 Pain in unspecified joint: Secondary | ICD-10-CM | POA: Diagnosis not present

## 2020-07-18 DIAGNOSIS — Z7982 Long term (current) use of aspirin: Secondary | ICD-10-CM | POA: Diagnosis not present

## 2020-07-18 DIAGNOSIS — R41841 Cognitive communication deficit: Secondary | ICD-10-CM | POA: Diagnosis not present

## 2020-07-18 DIAGNOSIS — I161 Hypertensive emergency: Secondary | ICD-10-CM | POA: Diagnosis not present

## 2020-07-18 DIAGNOSIS — R410 Disorientation, unspecified: Secondary | ICD-10-CM | POA: Diagnosis not present

## 2020-07-18 DIAGNOSIS — I951 Orthostatic hypotension: Secondary | ICD-10-CM | POA: Diagnosis not present

## 2020-07-18 DIAGNOSIS — N390 Urinary tract infection, site not specified: Secondary | ICD-10-CM | POA: Diagnosis not present

## 2020-07-18 DIAGNOSIS — D72828 Other elevated white blood cell count: Secondary | ICD-10-CM | POA: Diagnosis not present

## 2020-07-18 DIAGNOSIS — I6389 Other cerebral infarction: Secondary | ICD-10-CM | POA: Diagnosis not present

## 2020-07-18 DIAGNOSIS — M6281 Muscle weakness (generalized): Secondary | ICD-10-CM | POA: Diagnosis not present

## 2020-07-18 DIAGNOSIS — W19XXXA Unspecified fall, initial encounter: Secondary | ICD-10-CM | POA: Diagnosis not present

## 2020-07-18 LAB — BASIC METABOLIC PANEL
Anion gap: 13 (ref 5–15)
BUN: 11 mg/dL (ref 8–23)
CO2: 26 mmol/L (ref 22–32)
Calcium: 9 mg/dL (ref 8.9–10.3)
Chloride: 105 mmol/L (ref 98–111)
Creatinine, Ser: 1.12 mg/dL (ref 0.61–1.24)
GFR calc Af Amer: >60 mL/min (ref >60)
GFR calc non Af Amer: >60 mL/min (ref >60)
Glucose, Bld: 108 mg/dL — ABNORMAL HIGH (ref 70–99)
Potassium: 3.1 mmol/L — ABNORMAL LOW (ref 3.5–5.1)
Sodium: 144 mmol/L (ref 135–145)

## 2020-07-18 MED ORDER — POLYETHYLENE GLYCOL 3350 17 G PO PACK: 17.0000 g | PACK | Freq: Every day | ORAL | 0 refills | Status: DC

## 2020-07-18 MED ORDER — BISACODYL 10 MG RE SUPP: 10.0000 mg | Freq: Once | RECTAL | Status: DC

## 2020-07-18 MED ORDER — ENSURE ENLIVE PO LIQD: 237.0000 mL | Freq: Three times a day (TID) | ORAL | 12 refills | Status: DC

## 2020-07-18 MED ORDER — ASPIRIN EC 81 MG PO TBEC
81.0000 mg | DELAYED_RELEASE_TABLET | Freq: Every day | ORAL | Status: DC
Start: 2020-07-18 — End: 2021-01-02

## 2020-07-18 MED ORDER — POTASSIUM CHLORIDE CRYS ER 20 MEQ PO TBCR: 20.0000 meq | EXTENDED_RELEASE_TABLET | Freq: Two times a day (BID) | ORAL | Status: DC

## 2020-07-18 MED ORDER — CLOPIDOGREL BISULFATE 75 MG PO TABS
75.0000 mg | ORAL_TABLET | Freq: Every day | ORAL | 0 refills | Status: DC
Start: 2020-07-19 — End: 2020-10-22

## 2020-07-18 MED ORDER — BISACODYL 10 MG RE SUPP: 10.0000 mg | Freq: Every day | RECTAL | Status: DC | PRN

## 2020-07-18 NOTE — Progress Notes (Signed)
Mathew Miller. D/C'd to Skilled nursing facility per MD order.  Discussed with the patient family and all questions fully answered.  VSS, Skin clean, dry and intact without evidence of skin break down, no evidence of skin tears noted. IV catheter discontinued intact. Site without signs and symptoms of complications. Dressing and pressure applied.  An After Visit Summary was printed and given to PTAR. Report called to [facility] and [place], LPN received report.  All questions asked by [name] was answered by nurse, and he verbalized understanding.   Patient escorted via stretcher , and D/C to SNF via ambulance.  [name] 07/18/2020 5:03 PM

## 2020-07-18 NOTE — Care Management Important Message (Signed)
Important Message  Patient Details  Name: Mathew Miller. MRN: [id number] Date of Birth: [birth year]   Medicare Important Message Given:  Yes     Rudolf Blizard 07/18/2020, 3:15 PM

## 2020-07-18 NOTE — Evaluation (Signed)
Speech Language Pathology Evaluation Patient Details Name: Asiah Befort. MRN: [id number] DOB: [birth year] Today's Date: 07/18/2020 Time: 1200-1230 SLP Time Calculation (min) (ACUTE ONLY): 30 min  Problem List:  Patient Active Problem List   Diagnosis Date Noted  . CVA (cerebral vascular accident) ([place]) 07/15/2020  . Hypokalemia   . Left-sided weakness   . B12 deficiency 03/26/2020  . Unstable gait 10/01/2019  . Frequent falls 10/01/2019  . Imbalance 02/27/2018  . BMI 24.0-24.9, adult 02/26/2018  . Acid reflux 02/26/2018  . Memory changes 02/26/2018  . Vitamin D deficiency 03/01/2014  . Medication management 03/01/2014  . Hyperlipidemia   . History of prediabetes   . HTN (hypertension) 10/09/2013   Past Medical History:  Past Medical History:  Diagnosis Date  . Constipation   . Hyperlipidemia   . Hypertension   . Prediabetes    no meds   . Vitamin D deficiency    Past Surgical History:  Past Surgical History:  Procedure Laterality Date  . CATARACT EXTRACTION, BILATERAL  2017  . COLONOSCOPY    . NO PAST SURGERIES     HPI:  Cade Dashner. is a 74 y.o. male with medical history significant of orthostatic hypotension, frequent recurrent near syncope and syncope, vascular dementia, HTN, HLD, CKD stage II, chronic hypokalemia on supplement, prediabetes, presented with persistent left-sided weakness.  Had another episode of near syncope and fell on L side at home.  MRI positive for Acute white matter infarcts in the right frontal lobe.   Assessment / Plan / Recommendation Clinical Impression   Pt presents with moderately severe cognitive deficits which wife report to be close to pt's baseline.  Pt is oriented to self only but could reorient to place and date with mod assist verbal and visual cues for use of external aids.  Pt presents with slowed processing speed and required repetition of questions and instructions.  Pt's wife managed medications and finances at  baseline due to history of chronic cognitive deficits.  Pt would benefit from skilled ST services while inpatient in order to maximize functional independence and reduce burden of care prior to discharge.  Pt was receiving ST services prior to admission and would also benefit from resuming ST post discharge at next level of care.      SLP Assessment  SLP Recommendation/Assessment: Patient needs continued Speech Lanaguage Pathology Services SLP Visit Diagnosis: Cognitive communication deficit (R41.841)    Follow Up Recommendations  Skilled Nursing facility    Frequency and Duration min 1 x/week         SLP Evaluation Cognition  Overall Cognitive Status: History of cognitive impairments - at baseline Arousal/Alertness: Awake/alert Orientation Level: Oriented to person;Disoriented to place;Disoriented to time;Disoriented to situation Attention: Sustained Sustained Attention: Impaired Sustained Attention Impairment: Verbal basic Memory: Impaired Memory Impairment: Storage deficit;Decreased recall of new information Awareness: Impaired Awareness Impairment: Intellectual impairment Problem Solving: Appears intact Safety/Judgment: Impaired       Comprehension  Auditory Comprehension Overall Auditory Comprehension: Appears within functional limits for tasks assessed    Expression Expression Primary Mode of Expression: Verbal Verbal Expression Overall Verbal Expression: Appears within functional limits for tasks assessed   Oral / Motor  Oral Motor/Sensory Function Overall Oral Motor/Sensory Function: Within functional limits Motor Speech Overall Motor Speech: Appears within functional limits for tasks assessed   GO                    Emilio Math 07/18/2020, 12:43 PM

## 2020-07-18 NOTE — Consult Note (Signed)
Consultation Note Date: 07/18/2020   Patient Name: Mathew Miller.  DOB: 1946-09-14  MRN: [id number]  Age / Sex: 74 y.o., male  PCP: Unk Pinto, MD Referring Physician: Edwin Dada, *  Reason for Consultation: Establishing goals of care  HPI/Patient Profile: 74 y.o. male  with past medical history of dementia, orthostatic hypotension, recurrent syncope, HTN, HLD, CKD admitted on 07/15/2020 with left-sided weakness and fall. MRI revealed 2 small right frontal lobe white matter infarcts consistent with small vessel disease. Neurology following. PT/OT/SLP following. Plan is for SNF rehab. Baseline dementia. Palliative medicine consultation for goals of care.   Clinical Assessment and Goals of Care:  I have reviewed medical records, discussed with care team, and met with patient and wife at bedside to discuss goals of care. Mathew Miller is awake but with baseline dementia. He does not engage in discussion. He does not appear to be in pain or discomfort. Wife at bedside.   I introduced Palliative Medicine as specialized medical care for people living with serious illness. It focuses on providing relief from the symptoms and stress of a serious illness. The goal is to improve quality of life for both the patient and the family.  Wife shares that her husband has had mild dementia for many years. Baseline prior to admit, requires some assist with ADL's and cane for ambulation. He has been receiving home health PT and SLP. Frequent falls with hx of orthostatic hypotension and syncope. Two daughters but they are not local.   Discussed events leading up to admission and course of hospitalization including diagnoses, interventions, plan of care. Wife is appreciative of updates from Dr. Loleta Books. We discussed disease trajectory and expectations of progressive dementia. She understands this is not reversible.    I attempted to elicit values and goals of care important to the patient and wife. Wife is hopeful for discharge to [facility] place SNF for rehab. Mathew Miller has been willing to work with therapists inpatient. Wife is hopeful for him to return home following rehab stay but acknowledges she may need more support in the home. She is having a ramp built soon.   Advanced directives, concepts specific to code status, artifical feeding and hydration were discussed. Wife reports Mathew Miller has a documented living will but she has not looked at this documentation in many years. Discussed consideration of limitations to care including DNR/DNI with underlying age, frailty, and irreversible conditions. Expressed concern with decline in previous functional status with dementia and now acute stroke.   Introduced and discussed MOST form. No decisions are made today but wife plans to review his living will. She does seem to understand importance of consideration for limitations of care.   Palliative Care services outpatient were explained and offered. Wife agreeable.   Questions and concerns were addressed.  Hard Choices booklet left for review. PMT contact information given.    SUMMARY OF RECOMMENDATIONS    Good initial palliative discussion with wife/POA.   No decisions made today but wife plans to review his  documented living will and consider completion of MOST form in the near future.   Continue current plan of care and medical management.   Continue PT/OT/SLP efforts.  Plan is SNF rehab. Wife hopeful he will progress at rehab and be able to return home following rehab stay.   Wife agreeable with outpatient palliative referral at [facility] rehab. Updated Dr. Loleta Books.   Code Status/Advance Care Planning:  Full code: educated on medical recommendation for limitations of care including DNR/DNI. No decisions made today.  Symptom Management:   Per attending  Palliative Prophylaxis:   Aspiration, Delirium  Protocol, Oral Care and Turn Reposition  Psycho-social/Spiritual:   Desire for further Chaplaincy support:yes  Additional Recommendations: Caregiving  Support/Resources  Prognosis:   Unable to determine: guarded/poor long-term  Discharge Planning: [facility] for rehab with Palliative care service follow-up      Primary Diagnoses: Present on Admission: . CVA (cerebral vascular accident) ([place])   I have reviewed the medical record, interviewed the patient and family, and examined the patient. The following aspects are pertinent.  Past Medical History:  Diagnosis Date  . Constipation   . Hyperlipidemia   . Hypertension   . Prediabetes    no meds   . Vitamin D deficiency    Social History   Socioeconomic History  . Marital status: Unknown    Spouse name: Not on file  . Number of children: Not on file  . Years of education: Not on file  . Highest education level: Not on file  Occupational History  . Occupation: retired   Tobacco Use  . Smoking status: Never Smoker  . Smokeless tobacco: Never Used  Vaping Use  . Vaping Use: Never used  Substance and Sexual Activity  . Alcohol use: Yes    Alcohol/week: 0.0 standard drinks    Comment: very Rare /Beer.wine.  . Drug use: No  . Sexual activity: Not on file  Other Topics Concern  . Not on file  Social History Narrative  . Not on file   Social Determinants of Health   Financial Resource Strain:   . Difficulty of Paying Living Expenses: Not on file  Food Insecurity:   . Worried About Charity fundraiser in the Last Year: Not on file  . Ran Out of Food in the Last Year: Not on file  Transportation Needs:   . Lack of Transportation (Medical): Not on file  . Lack of Transportation (Non-Medical): Not on file  Physical Activity:   . Days of Exercise per Week: Not on file  . Minutes of Exercise per Session: Not on file  Stress:   . Feeling of Stress : Not on file  Social Connections:   . Frequency of  Communication with Friends and Family: Not on file  . Frequency of Social Gatherings with Friends and Family: Not on file  . Attends Religious Services: Not on file  . Active Member of Clubs or Organizations: Not on file  . Attends Archivist Meetings: Not on file  . Marital Status: Not on file   Family History  Problem Relation Age of Onset  . Hyperlipidemia Mother   . Glaucoma Mother   . Heart disease Father   . Hypertension Father   . Colon cancer Neg Hx   . Esophageal cancer Neg Hx   . Pancreatic cancer Neg Hx   . Prostate cancer Neg Hx   . Rectal cancer Neg Hx   . Stomach cancer Neg Hx  Scheduled Meds: .  stroke: mapping our early stages of recovery book   Does not apply Once  . aspirin EC  81 mg Oral Daily  . cholecalciferol  5,000 Units Oral Daily  . clopidogrel  75 mg Oral Daily  . enoxaparin (LOVENOX) injection  40 mg Subcutaneous Q24H  . feeding supplement (ENSURE ENLIVE)  237 mL Oral TID BM  . mirabegron ER  50 mg Oral Daily  . multivitamin with minerals  1 tablet Oral Daily  . pantoprazole  40 mg Oral Daily  . polyethylene glycol  17 g Oral Daily  . potassium chloride  20 mEq Oral BID  . QUEtiapine  25 mg Oral QHS  . rosuvastatin  20 mg Oral Daily  . senna-docusate  1 tablet Oral Daily  . senna-docusate  1 tablet Oral QHS  . vitamin B-12  1,000 mcg Oral Daily   Continuous Infusions: PRN Meds:.bisacodyl, labetalol, senna-docusate Medications Prior to Admission:  Prior to Admission medications   Medication Sig Start Date End Date Taking? Authorizing Provider  fludrocortisone (FLORINEF) 0.1 MG tablet Take 2 tablets 3 x/day  to maintain Standing BP over 100 Patient taking differently: Take 0.1 mg by mouth See admin instructions. Take one tablet by mouth six times daily per spouse 01/23/20  Yes Unk Pinto, MD  Magnesium 250 MG TABS Take 250 mg by mouth daily.   Yes [provider]  midodrine (PROAMATINE) 10 MG tablet Take 1 tablet 3 x /day  for Low BP Patient taking differently: Take 10 mg by mouth 3 (three) times daily.  12/17/19  Yes Unk Pinto, MD  mirabegron ER (MYRBETRIQ) 50 MG TB24 tablet Take 1 tablet Daily for Bladder Patient taking differently: Take 50 mg by mouth daily.  01/09/20  Yes Unk Pinto, MD  omeprazole (PRILOSEC) 20 MG capsule Take 1 capsule   2 x   /day   to Prevent Heartburn & Indigestion Patient taking differently: Take 20 mg by mouth daily.  05/18/20  Yes Unk Pinto, MD  potassium chloride SA (KLOR-CON) 20 MEQ tablet Take 1 tablet 2 x /day for Potassium Patient taking differently: Take 20 mEq by mouth 2 (two) times daily.  12/17/19  Yes Unk Pinto, MD  rosuvastatin (CRESTOR) 5 MG tablet Take 1 tablet Daily for Cholesterol Patient taking differently: Take 5 mg by mouth daily.  11/23/19  Yes Unk Pinto, MD  senna-docusate (GNP STOOL SOFTENER/LAXATIVE) 8.6-50 MG tablet Take 1 tablet Daily for Constipation Patient taking differently: Take 1 tablet by mouth daily.  10/20/19  Yes Unk Pinto, MD  vitamin B-12 (CYANOCOBALAMIN) 500 MCG tablet Dissolve 1 tablet under tongue daily Patient taking differently: Take 500 mcg by mouth daily. Dissolve 1 tablet under tongue daily 06/29/18  Yes Unk Pinto, MD  VITAMIN D PO Take 5,000 Units by mouth daily.    Yes [provider]  Accu-Chek Softclix Lancets lancets Check blood sugar 1 time a day.[id number]. Patient taking differently: 1 each by Other route See admin instructions. Check blood sugar 1 time a day.[id number]. 12/17/19   Unk Pinto, MD  Alcohol Swabs (B-D SINGLE USE SWABS REGULAR) PADS Check blood sugar 1 time daily-[id number]. Patient taking differently: 1 each by Other route See admin instructions. Check blood sugar 1 time daily-[id number]. 12/17/19   Unk Pinto, MD  Blood Glucose Monitoring Suppl (ACCU-CHEK AVIVA PLUS) w/Device KIT USE AS DIRECTED Patient not taking: Reported on 07/15/2020 12/20/19   Liane Comber, NP    glucose blood (ACCU-CHEK AVIVA PLUS) test strip Check blood  sugar 1 time a day-[id number] Patient not taking: Reported on 07/15/2020 12/17/19   Unk Pinto, MD  QUEtiapine (SEROQUEL) 50 MG tablet Take 1 to 2 tablets in late Afternoon for "Sundowning" Patient not taking: Reported on 07/15/2020 07/11/20   Unk Pinto, MD   Allergies  Allergen Reactions  . Hydrocodone-Acetaminophen Nausea Only  . Sudafed [Pseudoephedrine Hcl]     Urinary retention  . Tetanus Toxoids     Itching, burning feeling, ran a fever   Review of Systems  Unable to perform ROS: Dementia   Physical Exam Vitals and nursing note reviewed.  Constitutional:      Appearance: He is ill-appearing.  HENT:     Head: Normocephalic and atraumatic.  Pulmonary:     Effort: No tachypnea, accessory muscle usage or respiratory distress.  Skin:    General: Skin is warm and dry.     Coloration: Skin is pale.  Neurological:     Mental Status: He is easily aroused.     Comments: Awake, baseline dementia with pleasant confusion. Does not participate in discussion.   Psychiatric:        Attention and Perception: He is inattentive.        Speech: Speech is delayed.        Cognition and Memory: Cognition is impaired.    Vital Signs: BP (!) 146/80 (BP Location: Right Arm)   Pulse 68   Temp 97.8 F (36.6 C) (Oral)   Resp 18   Ht 6' 2"  (1.88 m)   Wt 78 kg   SpO2 97%   BMI 22.08 kg/m  Pain Scale: PAINAD   Pain Score: 0-No pain   SpO2: SpO2: 97 % O2 Device:SpO2: 97 % O2 Flow Rate: .   IO: Intake/output summary:   Intake/Output Summary (Last 24 hours) at 07/18/2020 1451 Last data filed at 07/18/2020 0320 Gross per 24 hour  Intake 1472.76 ml  Output 1500 ml  Net -27.24 ml    LBM: Last BM Date: 07/13/20 Baseline Weight: Weight: 78 kg Most recent weight: Weight: 78 kg     Palliative Assessment/Data: PPS 40%   Flowsheet Rows     Most Recent Value  Intake Tab  Referral Department Hospitalist  Unit at  Time of Referral Med/Surg Unit  Palliative Care Primary Diagnosis Neurology  Palliative Care Type New Palliative care  Reason for referral Clarify Goals of Care  Date first seen by Palliative Care 07/18/20  Clinical Assessment  Palliative Performance Scale Score 40%  Psychosocial & Spiritual Assessment  Palliative Care Outcomes  Patient/Family meeting held? Yes  Who was at the meeting? wife  Palliative Care Outcomes Clarified goals of care, Provided psychosocial or spiritual support, ACP counseling assistance, Linked to palliative care logitudinal support       Time Total: 4mn Greater than 50%  of this time was spent counseling and coordinating care related to the above assessment and plan.  Signed by:  MIhor Dow DNP, FNP-C Palliative Medicine Team  Phone: [phone]: [phone]  Please contact Palliative Medicine Team phone at [phone] questions and concerns.  For individual provider: See AShea Evans

## 2020-07-18 NOTE — Discharge Summary (Signed)
Physician Discharge Summary  Mathew Miller. WJX:[id number] DOB: 19-Mar-1946 DOA: 07/15/2020  PCP: Unk Pinto, MD  Admit date: 07/15/2020 Discharge date: 07/18/2020  Admitted From: Home  Disposition:  SNF [facility]   Recommendations for Outpatient Follow-up:  1. Follow up with Neurology in 4-6 weeks 2. Please follow up with PCP 1 week after discharge from SNF 3. [facility]: Please give Plavix 75 and aspirin 81 for three weeks then continue aspirin alone 4. [facility]: Please check orthostatic BP three times weekly for 2 weeks and restart Florinef if needed 5. [facility]: Please check K in 1 week and stop potassium if needed       Home Health: N/A  Equipment/Devices: TBD at SNF  Discharge Condition: Fair  CODE STATUS: FULL Diet recommendation: Cardiac  Brief/Interim Summary: Mr. Mathew Miller is a 74 y.o. M with dementia, home dwelling, labile orthostatic hypotension due to dysautonomia complicated by recurrent syncope who presented with left-sided weakness and fall.  Several days ago, patient had sudden onset left-sided weakness in the arm, this did not improve, and on the day of admission the patient had a syncopal episode that was worse than usual, so he was brought to the ER.  In the ER, potassium 2.5, creatinine at baseline, CT head no acute changes.  Admitted for hypokalemia and stroke work-up.     PRINCIPAL HOSPITAL DIAGNOSIS: Acute stroke    Discharge Diagnoses:   Small vessel ischemic stroke Patient admitted and MRI brain showed 2 small right frontal lobe white matter infarcts, consistent with small vessel disease.  Cervical MRI showed some foraminal disease, which actually correlate with his left-sided arm weakness.  Non-invasive angiography showed no major intracranial arterial occlusion, scattered small vessel disease  Echocardiogram showed no cardiogenic source of embolism. Carotid imaging showed no stenosis.  Discharged on Crestor  Continue low-dose aspirin plus  Plavix for 3 weeks followed by aspirin alone  Monitored on telemetry, no atrial fibrillation noted.    Dementia Continue quetiapine  Hypokalemia Hypocalcemia Low K due to Florinef use Calcium level normalized. Continue daily potassium supplement, check K in 1 week  AKI Creatinine 1.59 on admission, resolved to baseline 0.8 mg/dl with IV fluids.  Orthostatic hypotension This is a long-term problem.    Had previously been on midodrine and florinef longterm prior to admission.  During this hospital stay, patient had prolonged severe hypertension and hypokalemia, so these were stopped.  BP improved somewhat off these meds.  Likely will need to resume Florinef and/or compression stockings with abdominal binder in the future.  Possibly midodrine as well.    Less likely will need longterm antihypertensive therapy.   GERD Continue PPI  Bladder dysfunction Continue mirabegron  Degenerative spine disease MRI C-spine shows some foraminal narrowing at C5-7 level.  Not a candidate for spine surgery.            Discharge Instructions  Discharge Instructions    Diet Carb Modified   Complete by: As directed    Increase activity slowly   Complete by: As directed      Allergies as of 07/18/2020      Reactions   Hydrocodone-acetaminophen Nausea Only   Sudafed [pseudoephedrine Hcl]    Urinary retention   Tetanus Toxoids    Itching, burning feeling, ran a fever      Medication List    STOP taking these medications   Accu-Chek Aviva Plus test strip Generic drug: glucose blood   Accu-Chek Aviva Plus w/Device Kit   fludrocortisone 0.1 MG tablet Commonly  known as: FLORINEF   midodrine 10 MG tablet Commonly known as: PROAMATINE     TAKE these medications   Accu-Chek Softclix Lancets lancets Check blood sugar 1 time a day.[id number]. What changed:   how much to take  how to take this  when to take this   aspirin EC 81 MG tablet Take 1 tablet  (81 mg total) by mouth daily. Swallow whole.   B-D SINGLE USE SWABS REGULAR Pads Check blood sugar 1 time daily-[id number]. What changed:   how much to take  how to take this  when to take this   clopidogrel 75 MG tablet Commonly known as: PLAVIX Take 1 tablet (75 mg total) by mouth daily. Start taking on: July 19, 2020   feeding supplement (ENSURE ENLIVE) Liqd Take 237 mLs by mouth 3 (three) times daily between meals.   Magnesium 250 MG Tabs Take 250 mg by mouth daily.   mirabegron ER 50 MG Tb24 tablet Commonly known as: Myrbetriq Take 1 tablet Daily for Bladder What changed:   how much to take  how to take this  when to take this  additional instructions   omeprazole 20 MG capsule Commonly known as: PRILOSEC Take 1 capsule   2 x   /day   to Prevent Heartburn & Indigestion What changed:   how much to take  how to take this  when to take this  additional instructions   polyethylene glycol 17 g packet Commonly known as: MIRALAX / GLYCOLAX Take 17 g by mouth daily. Start taking on: July 19, 2020   potassium chloride SA 20 MEQ tablet Commonly known as: KLOR-CON Take 1 tablet 2 x /day for Potassium What changed:   how much to take  how to take this  when to take this  additional instructions   QUEtiapine 50 MG tablet Commonly known as: SEROquel Take 1 to 2 tablets in late Afternoon for "Sundowning"   rosuvastatin 5 MG tablet Commonly known as: CRESTOR Take 1 tablet Daily for Cholesterol What changed:   how much to take  how to take this  when to take this  additional instructions   senna-docusate 8.6-50 MG tablet Commonly known as: GNP Stool Softener/Laxative Take 1 tablet Daily for Constipation What changed:   how much to take  how to take this  when to take this  additional instructions   vitamin B-12 500 MCG tablet Commonly known as: CYANOCOBALAMIN Dissolve 1 tablet under tongue daily What changed:   how much to  take  how to take this  when to take this   VITAMIN D PO Take 5,000 Units by mouth daily.       Contact information for after-discharge care    Destination    [facility] .   Service: Skilled Nursing Contact information: [place] [address] [phone]                 Allergies  Allergen Reactions  . Hydrocodone-Acetaminophen Nausea Only  . Sudafed [Pseudoephedrine Hcl]     Urinary retention  . Tetanus Toxoids     Itching, burning feeling, ran a fever    Consultations:  Neurology   Procedures/Studies: DG Ribs Unilateral W/Chest Left  Result Date: 07/15/2020 CLINICAL DATA:  Left rib pain after fall. EXAM: LEFT RIBS AND CHEST - 3+ VIEW COMPARISON:  May 22, 2020. FINDINGS: No fracture or other bone lesions are seen involving the ribs. There is no evidence of pneumothorax or pleural  effusion. Both lungs are clear. Heart size and mediastinal contours are within normal limits. IMPRESSION: Negative. Electronically Signed   By: Marijo Conception M.D.   On: 07/15/2020 14:35   DG Tibia/Fibula Left  Result Date: 07/15/2020 CLINICAL DATA:  Left leg pain after fall. EXAM: LEFT TIBIA AND FIBULA - 2 VIEW COMPARISON:  None. FINDINGS: There is no evidence of fracture or other focal bone lesions. Soft tissues are unremarkable. IMPRESSION: Negative. Electronically Signed   By: Marijo Conception M.D.   On: 07/15/2020 14:38   CT HEAD WO CONTRAST  Result Date: 07/15/2020 CLINICAL DATA:  Neuro deficit, stroke suspected. EXAM: CT HEAD WITHOUT CONTRAST CT CERVICAL SPINE WITHOUT CONTRAST TECHNIQUE: Multidetector CT imaging of the head and cervical spine was performed following the standard protocol without intravenous contrast. Multiplanar CT image reconstructions of the cervical spine were also generated. COMPARISON:  None. FINDINGS: CT HEAD FINDINGS Brain: No evidence of acute large vascular territory infarction, hemorrhage, hydrocephalus,  extra-axial collection or mass lesion/mass effect. Similar appearance of a remote infarct in the right frontal white matter. Similar additional patchy white matter hypoattenuation, compatible with chronic microvascular ischemic disease. Vascular: Calcific intracranial atherosclerosis. Skull: Normal. Negative for fracture or focal lesion. Sinuses/Orbits: No acute finding. Other: No mastoid effusion. CT CERVICAL SPINE FINDINGS Alignment: Exaggerated cervical lordosis, likely positional. Mild anterolisthesis of C4 on C5, likely degenerative given facet arthropathy at this level. Skull base and vertebrae: Vertebral body heights are maintained. No evidence of acute fracture. Soft tissues and spinal canal: No prevertebral fluid or swelling. No visible canal hematoma. Disc levels: Multilevel degenerative disc disease, greatest at C5-C6 and C6-C7. No evidence of advanced bony canal stenosis. Bulky multilevel facet hypertrophy. Upper chest: Mild paraseptal emphysema without consolidation. Other: Vascular calcifications. IMPRESSION: 1. No evidence of acute fracture or traumatic malalignment. 2. Multilevel degenerative disc disease, greatest at C5-C6 and C6-C7. Electronically Signed   By: Margaretha Sheffield MD   On: 07/15/2020 16:50   CT Cervical Spine Wo Contrast  Result Date: 07/15/2020 CLINICAL DATA:  Neuro deficit, stroke suspected. EXAM: CT HEAD WITHOUT CONTRAST CT CERVICAL SPINE WITHOUT CONTRAST TECHNIQUE: Multidetector CT imaging of the head and cervical spine was performed following the standard protocol without intravenous contrast. Multiplanar CT image reconstructions of the cervical spine were also generated. COMPARISON:  None. FINDINGS: CT HEAD FINDINGS Brain: No evidence of acute large vascular territory infarction, hemorrhage, hydrocephalus, extra-axial collection or mass lesion/mass effect. Similar appearance of a remote infarct in the right frontal white matter. Similar additional patchy white matter  hypoattenuation, compatible with chronic microvascular ischemic disease. Vascular: Calcific intracranial atherosclerosis. Skull: Normal. Negative for fracture or focal lesion. Sinuses/Orbits: No acute finding. Other: No mastoid effusion. CT CERVICAL SPINE FINDINGS Alignment: Exaggerated cervical lordosis, likely positional. Mild anterolisthesis of C4 on C5, likely degenerative given facet arthropathy at this level. Skull base and vertebrae: Vertebral body heights are maintained. No evidence of acute fracture. Soft tissues and spinal canal: No prevertebral fluid or swelling. No visible canal hematoma. Disc levels: Multilevel degenerative disc disease, greatest at C5-C6 and C6-C7. No evidence of advanced bony canal stenosis. Bulky multilevel facet hypertrophy. Upper chest: Mild paraseptal emphysema without consolidation. Other: Vascular calcifications. IMPRESSION: 1. No evidence of acute fracture or traumatic malalignment. 2. Multilevel degenerative disc disease, greatest at C5-C6 and C6-C7. Electronically Signed   By: Margaretha Sheffield MD   On: 07/15/2020 16:50   MR ANGIO HEAD WO CONTRAST  Result Date: 07/15/2020 CLINICAL DATA:  Left-sided weakness. EXAM: MRI HEAD  WITHOUT CONTRAST MRA HEAD WITHOUT CONTRAST TECHNIQUE: Multiplanar, multiecho pulse sequences of the brain and surrounding structures were obtained without intravenous contrast. Angiographic images of the head were obtained using MRA technique without contrast. COMPARISON:  Head CT 07/15/2020 and MRI 01/19/2018 FINDINGS: MRI HEAD FINDINGS Brain: There is a 1.5 cm acute white matter infarct in the posterior right frontal lobe at the level of the centrum semiovale and upper corona radiata, and there is also a punctate acute white matter infarct more anteriorly in the right frontal lobe in the centrum semiovale. There is an increased number of chronic microhemorrhages scattered both peripherally in both cerebral hemispheres as well as in the left thalamus  and basal ganglia compared to the prior MRI which may be related to chronic hypertension. T2 hyperintensities in the cerebral white matter bilaterally and in the pons have progressed from the prior MRI and are nonspecific but compatible with moderate chronic small vessel ischemic disease. There is an increased number of chronic lacunar infarcts in the cerebral white matter bilaterally. Chronic infarcts are also noted in the thalami and body of the corpus callosum. No mass, midline shift, or extra-axial fluid collection is identified. There is mild cerebral atrophy. Vascular: Major intracranial vascular flow voids are preserved. Skull and upper cervical spine: Unremarkable bone marrow signal. Sinuses/Orbits: Bilateral cataract extraction. Paranasal sinuses and mastoid air cells are clear. Other: None. MRA HEAD FINDINGS The visualized distal vertebral arteries are widely patent to the basilar with the left being slightly dominant. Patent bilateral PICA, right AICA, and bilateral SCA origins are identified. The basilar artery is widely patent. Posterior communicating arteries are diminutive or absent. Both PCAs are patent without evidence of a significant proximal stenosis on the left. There is a mild-to-moderate proximal right P2 stenosis. The internal carotid arteries are widely patent from skull base to carotid termini. ACAs and MCAs are patent without evidence of a proximal branch occlusion or significant proximal stenosis. No aneurysm is identified. IMPRESSION: 1. Acute white matter infarcts in the right frontal lobe. 2. Moderate chronic small vessel ischemic disease with multiple chronic lacunar infarcts, progressed from 2019. 3. No major intracranial arterial occlusion. 4. Mild-to-moderate right P2 stenosis. Electronically Signed   By: Logan Bores M.D.   On: 07/15/2020 19:45   MR BRAIN WO CONTRAST  Result Date: 07/15/2020 CLINICAL DATA:  Left-sided weakness. EXAM: MRI HEAD WITHOUT CONTRAST MRA HEAD WITHOUT  CONTRAST TECHNIQUE: Multiplanar, multiecho pulse sequences of the brain and surrounding structures were obtained without intravenous contrast. Angiographic images of the head were obtained using MRA technique without contrast. COMPARISON:  Head CT 07/15/2020 and MRI 01/19/2018 FINDINGS: MRI HEAD FINDINGS Brain: There is a 1.5 cm acute white matter infarct in the posterior right frontal lobe at the level of the centrum semiovale and upper corona radiata, and there is also a punctate acute white matter infarct more anteriorly in the right frontal lobe in the centrum semiovale. There is an increased number of chronic microhemorrhages scattered both peripherally in both cerebral hemispheres as well as in the left thalamus and basal ganglia compared to the prior MRI which may be related to chronic hypertension. T2 hyperintensities in the cerebral white matter bilaterally and in the pons have progressed from the prior MRI and are nonspecific but compatible with moderate chronic small vessel ischemic disease. There is an increased number of chronic lacunar infarcts in the cerebral white matter bilaterally. Chronic infarcts are also noted in the thalami and body of the corpus callosum. No mass, midline shift,  or extra-axial fluid collection is identified. There is mild cerebral atrophy. Vascular: Major intracranial vascular flow voids are preserved. Skull and upper cervical spine: Unremarkable bone marrow signal. Sinuses/Orbits: Bilateral cataract extraction. Paranasal sinuses and mastoid air cells are clear. Other: None. MRA HEAD FINDINGS The visualized distal vertebral arteries are widely patent to the basilar with the left being slightly dominant. Patent bilateral PICA, right AICA, and bilateral SCA origins are identified. The basilar artery is widely patent. Posterior communicating arteries are diminutive or absent. Both PCAs are patent without evidence of a significant proximal stenosis on the left. There is a  mild-to-moderate proximal right P2 stenosis. The internal carotid arteries are widely patent from skull base to carotid termini. ACAs and MCAs are patent without evidence of a proximal branch occlusion or significant proximal stenosis. No aneurysm is identified. IMPRESSION: 1. Acute white matter infarcts in the right frontal lobe. 2. Moderate chronic small vessel ischemic disease with multiple chronic lacunar infarcts, progressed from 2019. 3. No major intracranial arterial occlusion. 4. Mild-to-moderate right P2 stenosis. Electronically Signed   By: Logan Bores M.D.   On: 07/15/2020 19:45   MR CERVICAL SPINE WO CONTRAST  Result Date: 07/16/2020 CLINICAL DATA:  Hyper-reflexia with chronic neck pain. EXAM: MRI CERVICAL SPINE WITHOUT CONTRAST TECHNIQUE: Multiplanar, multisequence MR imaging of the cervical spine was performed. No intravenous contrast was administered. COMPARISON:  None. FINDINGS: Alignment: Normal Vertebrae: Mild wedge compression fractures of T3 and T4 with mild superior endplate edema and approximately 10% height loss. No retropulsion. No other bone marrow abnormality. Cord: Normal Posterior Fossa, vertebral arteries, paraspinal tissues: Negative Disc levels: C1-2: Unremarkable. C2-3: Disc desiccation without herniation . No spinal canal stenosis. No neural foraminal stenosis. C3-4: Disc desiccation without herniation. There is no spinal canal stenosis. No neural foraminal stenosis. C4-5: Small central disc protrusion with mild facet hypertrophy bilaterally. Mild spinal canal stenosis. No neural foraminal stenosis. C5-6: Left foraminal disc osteophyte complex. There is no spinal canal stenosis. Severe left neural foraminal stenosis. C6-7: Left foraminal disc osteophyte complex. There is no spinal canal stenosis. Severe left neural foraminal stenosis. C7-T1: Normal disc space and facet joints. There is no spinal canal stenosis. No neural foraminal stenosis. IMPRESSION: 1. Severe left C5-6 and  C6-7 neural foraminal stenosis secondary to foraminal disc osteophyte complexes. 2. Mild C4-5 spinal canal stenosis. 3. Mild T3 and T4 wedge compression fractures, age indeterminate. Correlate for recent trauma. Electronically Signed   By: Ulyses Jarred M.D.   On: 07/16/2020 23:03   ECHOCARDIOGRAM COMPLETE  Result Date: 07/16/2020    ECHOCARDIOGRAM REPORT   Patient Name:   Mathew Miller. Date of Exam: 07/16/2020 Medical Rec #:  [id number]           Height:       74.0 in Accession #:    [id number]          Weight:       172.0 lb Date of Birth:  12/25/1945           BSA:          2.039 m Patient Age:    43 years            BP:           170/92 mmHg Patient Gender: M                   HR:           80 bpm. Exam Location:  Inpatient Procedure:  2D Echo Indications:    TIA 435.9  History:        Patient has prior history of Echocardiogram examinations, most                 recent 04/10/2019. Chronic kidney disease,                 Signs/Symptoms:Syncope; Risk Factors:Hypertension and                 Dyslipidemia.  Sonographer:    Johny Chess Referring Phys: [id number] [place]  1. Left ventricular ejection fraction, by estimation, is 60 to 65%. The left ventricle has normal function. The left ventricle has no regional wall motion abnormalities. There is mild left ventricular hypertrophy. Left ventricular diastolic parameters are consistent with Grade I diastolic dysfunction (impaired relaxation).  2. Right ventricular systolic function is normal. The right ventricular size is normal.  3. The mitral valve is normal in structure. Trivial mitral valve regurgitation. No evidence of mitral stenosis.  4. The aortic valve is tricuspid. Aortic valve regurgitation is not visualized. Mild to moderate aortic valve sclerosis/calcification is present, without any evidence of aortic stenosis.  5. The inferior vena cava is normal in size with greater than 50% respiratory variability, suggesting right atrial  pressure of 3 mmHg. Comparison(s): No significant change from prior study. Prior images reviewed side by side. Conclusion(s)/Recommendation(s): No intracardiac source of embolism detected on this transthoracic study. A transesophageal echocardiogram is recommended to exclude cardiac source of embolism if clinically indicated. FINDINGS  Left Ventricle: Left ventricular ejection fraction, by estimation, is 60 to 65%. The left ventricle has normal function. The left ventricle has no regional wall motion abnormalities. The left ventricular internal cavity size was normal in size. There is  mild left ventricular hypertrophy. Left ventricular diastolic parameters are consistent with Grade I diastolic dysfunction (impaired relaxation). Right Ventricle: The right ventricular size is normal. No increase in right ventricular wall thickness. Right ventricular systolic function is normal. Left Atrium: Left atrial size was normal in size. Right Atrium: Right atrial size was normal in size. Pericardium: There is no evidence of pericardial effusion. Mitral Valve: The mitral valve is normal in structure. Trivial mitral valve regurgitation. No evidence of mitral valve stenosis. Tricuspid Valve: The tricuspid valve is normal in structure. Tricuspid valve regurgitation is not demonstrated. No evidence of tricuspid stenosis. Aortic Valve: The aortic valve is tricuspid. Aortic valve regurgitation is not visualized. Mild to moderate aortic valve sclerosis/calcification is present, without any evidence of aortic stenosis. Pulmonic Valve: The pulmonic valve was normal in structure. Pulmonic valve regurgitation is trivial. No evidence of pulmonic stenosis. Aorta: The aortic root is normal in size and structure. Venous: The inferior vena cava is normal in size with greater than 50% respiratory variability, suggesting right atrial pressure of 3 mmHg. IAS/Shunts: No atrial level shunt detected by color flow Doppler.  LEFT VENTRICLE PLAX 2D  LVIDd:         4.30 cm  Diastology LVIDs:         3.00 cm  LV e' medial:    6.53 cm/s LV PW:         1.10 cm  LV E/e' medial:  13.7 LV IVS:        1.20 cm  LV e' lateral:   7.18 cm/s LVOT diam:     2.00 cm  LV E/e' lateral: 12.4 LV SV:         118 LV SV Index:  58 LVOT Area:     3.14 cm  RIGHT VENTRICLE             IVC RV S prime:     20.00 cm/s  IVC diam: 1.50 cm TAPSE (M-mode): 2.8 cm LEFT ATRIUM             Index       RIGHT ATRIUM           Index LA diam:        4.00 cm 1.96 cm/m  RA Area:     12.00 cm LA Vol (A2C):   53.4 ml 26.20 ml/m RA Volume:   23.80 ml  11.68 ml/m LA Vol (A4C):   59.8 ml 29.33 ml/m LA Biplane Vol: 59.2 ml 29.04 ml/m  AORTIC VALVE LVOT Vmax:   149.00 cm/s LVOT Vmean:  113.000 cm/s LVOT VTI:    0.375 m  AORTA Ao Root diam: 3.30 cm Ao Asc diam:  3.40 cm MITRAL VALVE MV Area (PHT): 3.65 cm     SHUNTS MV Decel Time: 208 msec     Systemic VTI:  0.38 m MV E velocity: 89.30 cm/s   Systemic Diam: 2.00 cm MV A velocity: 111.00 cm/s MV E/A ratio:  0.80 Candee Furbish MD Electronically signed by Candee Furbish MD Signature Date/Time: 07/16/2020/4:39:26 PM    Final    VAS US CAROTID  Result Date: 07/17/2020 Carotid Arterial Duplex Study Indications:       CVA. Risk Factors:      Hypertension, hyperlipidemia. Limitations        Today's exam was limited due to the patient's inability or                    unwillingness to cooperate, the patient's respiratory                    variation and constant patient movement. Comparison Study:  No prior studies. Performing Technologist: Oliver Hum RVT  Examination Guidelines: A complete evaluation includes B-mode imaging, spectral Doppler, color Doppler, and power Doppler as needed of all accessible portions of each vessel. Bilateral testing is considered an integral part of a complete examination. Limited examinations for reoccurring indications may be performed as noted.  Right Carotid Findings:  +----------+--------+-------+--------+--------------------------------+--------+           PSV cm/sEDV    StenosisPlaque Description              Comments                   cm/s                                                    +----------+--------+-------+--------+--------------------------------+--------+ CCA Prox  50      10             smooth and heterogenous                  +----------+--------+-------+--------+--------------------------------+--------+ CCA Distal49      11             smooth and heterogenous                  +----------+--------+-------+--------+--------------------------------+--------+ ICA Prox  53      13  calcific, smooth and                                                      heterogenous                             +----------+--------+-------+--------+--------------------------------+--------+ ICA Distal83      15                                             tortuous +----------+--------+-------+--------+--------------------------------+--------+ ECA       72      12                                                      +----------+--------+-------+--------+--------------------------------+--------+ +----------+--------+-------+--------+-------------------+           PSV cm/sEDV cmsDescribeArm Pressure (mmHG) +----------+--------+-------+--------+-------------------+ [id number]                                        +----------+--------+-------+--------+-------------------+ +---------+--------+--+--------+-+---------+ VertebralPSV cm/s26EDV cm/s6Antegrade +---------+--------+--+--------+-+---------+  Left Carotid Findings: +----------+--------+--------+--------+-----------------------+--------+           PSV cm/sEDV cm/sStenosisPlaque Description     Comments +----------+--------+--------+--------+-----------------------+--------+ CCA Prox  54      9               smooth and  heterogenous         +----------+--------+--------+--------+-----------------------+--------+ CCA Distal68      19              smooth and heterogenous         +----------+--------+--------+--------+-----------------------+--------+ ICA Prox  53      9               smooth and heterogenous         +----------+--------+--------+--------+-----------------------+--------+ ICA Distal63      19                                     tortuous +----------+--------+--------+--------+-----------------------+--------+ ECA       93      16                                              +----------+--------+--------+--------+-----------------------+--------+ +----------+--------+--------+--------+-------------------+           PSV cm/sEDV cm/sDescribeArm Pressure (mmHG) +----------+--------+--------+--------+-------------------+ [id number]                                          +----------+--------+--------+--------+-------------------+ +---------+--------+--+--------+-+---------+ VertebralPSV cm/s34EDV cm/s6Antegrade +---------+--------+--+--------+-+---------+   Summary: Right Carotid: Velocities in the right ICA are consistent with a 1-39% stenosis. Left Carotid: Velocities in the left ICA are consistent with a  1-39% stenosis. Vertebrals: Bilateral vertebral arteries demonstrate antegrade flow. *See table(s) above for measurements and observations.  Electronically signed by Antony Contras MD on 07/17/2020 at 12:31:42 PM.    Final        Subjective: Feeling well. Constipated.  No fever. No confusion, no change to left arm weakness.  Discharge Exam: Vitals:   07/18/20 0737 07/18/20 1214  BP: (!) 171/97 (!) 146/80  Pulse: 70 68  Resp: 16 18  Temp: 98.1 F (36.7 C) 97.8 F (36.6 C)  SpO2: 100% 97%   Vitals:   07/17/20 2327 07/18/20 0320 07/18/20 0737 07/18/20 1214  BP: (!) 169/89 (!) 171/90 (!) 171/97 (!) 146/80  Pulse: 73 77 70 68  Resp: _0 Temp:  98.3 F (36.8 C) 98 F (36.7 C) 98.1 F (36.7 C) 97.8 F (36.6 C)  TempSrc: Oral Oral Oral Oral  SpO2: 99% 98% 100% 97%  Weight:      Height:        General: Pt is alert, awake, not in acute distress, lying in bed. Cardiovascular: RRR, nl B7-C4, systolic murmurs appreciated.   No LE edema.   Respiratory: Normal respiratory rate and rhythm.  CTAB without rales or wheezes. Abdominal: Abdomen soft and non-tender.  No distension or HSM.   Neuro/Psych: Strength diminished, more in left upper than right.  Judgment and insight appear impaired.   The results of significant diagnostics from this hospitalization (including imaging, microbiology, ancillary and laboratory) are listed below for reference.     Microbiology: Recent Results (from the past 240 hour(s))  Respiratory Panel by RT PCR (Flu A&B, Covid) - Nasopharyngeal Swab     Status: None   Collection Time: 07/15/20  3:39 PM   Specimen: Nasopharyngeal Swab  Result Value Ref Range Status   SARS Coronavirus 2 by RT PCR NEGATIVE NEGATIVE Final    Comment: (NOTE) SARS-CoV-2 target nucleic acids are NOT DETECTED.  The SARS-CoV-2 RNA is generally detectable in upper respiratoy specimens during the acute phase of infection. The lowest concentration of SARS-CoV-2 viral copies this assay can detect is 131 copies/mL. A negative result does not preclude SARS-Cov-2 infection and should not be used as the sole basis for treatment or other patient management decisions. A negative result may occur with  improper specimen collection/handling, submission of specimen other than nasopharyngeal swab, presence of viral mutation(s) within the areas targeted by this assay, and inadequate number of viral copies (<131 copies/mL). A negative result must be combined with clinical observations, patient history, and epidemiological information. The expected result is Negative.  Fact Sheet for Patients:  PinkCheek.be  Fact  Sheet for Healthcare Providers:  GravelBags.it  This test is no t yet approved or cleared by the [place] FDA and  has been authorized for detection and/or diagnosis of SARS-CoV-2 by FDA under an Emergency Use Authorization (EUA). This EUA will remain  in effect (meaning this test can be used) for the duration of the COVID-19 declaration under Section 564(b)(1) of the Act, 21 U.S.C. section 360bbb-3(b)(1), unless the authorization is terminated or revoked sooner.     Influenza A by PCR NEGATIVE NEGATIVE Final   Influenza B by PCR NEGATIVE NEGATIVE Final    Comment: (NOTE) The Xpert Xpress SARS-CoV-2/FLU/RSV assay is intended as an aid in  the diagnosis of influenza from Nasopharyngeal swab specimens and  should not be used as a sole basis for treatment. Nasal washings and  aspirates are unacceptable for Xpert Xpress SARS-CoV-2/FLU/RSV  testing.  Fact Sheet for Patients: PinkCheek.be  Fact Sheet for Healthcare Providers: GravelBags.it  This test is not yet approved or cleared by the [place] FDA and  has been authorized for detection and/or diagnosis of SARS-CoV-2 by  FDA under an Emergency Use Authorization (EUA). This EUA will remain  in effect (meaning this test can be used) for the duration of the  Covid-19 declaration under Section 564(b)(1) of the Act, 21  U.S.C. section 360bbb-3(b)(1), unless the authorization is  terminated or revoked. Performed at [facility], [place] [address], [place],  [address]      Labs: BNP (last 3 results) No results for input(s): BNP in the last 8760 hours. Basic Metabolic Panel: Recent Labs  Lab 07/15/20 2116 07/16/20 0952 07/16/20 1744 07/17/20 0855 07/18/20 0210  NA 145 144 143 144 144  K 2.6* 2.9* 3.1* 3.4* 3.1*  CL 104 120* 108 108 105  CO2 31 19* _0 GLUCOSE 103* 101* 168* 96 108*  BUN _1 CREATININE  1.35* 0.83 1.34* 1.10 1.12  CALCIUM 8.5* 5.6* 8.9 8.7* 9.0  MG 2.8*  --   --   --   --    Liver Function Tests: Recent Labs  Lab 07/15/20 1316  AST 27  ALT 20  ALKPHOS 81  BILITOT 1.1  PROT 5.8*  ALBUMIN 3.2*   No results for input(s): LIPASE, AMYLASE in the last 168 hours. No results for input(s): AMMONIA in the last 168 hours. CBC: Recent Labs  Lab 07/15/20 1316 07/15/20 1327 07/17/20 0855  WBC 8.6  --  8.1  NEUTROABS 6.5  --   --   HGB 13.1 12.2* 12.0*  HCT 39.1 36.0* 36.2*  MCV 88.9  --  87.4  PLT 150  --  141*   Cardiac Enzymes: No results for input(s): CKTOTAL, CKMB, CKMBINDEX, TROPONINI in the last 168 hours. BNP: Invalid input(s): POCBNP CBG: Recent Labs  Lab 07/15/20 1317  GLUCAP 128*   D-Dimer No results for input(s): DDIMER in the last 72 hours. Hgb A1c Recent Labs    07/16/20 0340  HGBA1C 4.9   Lipid Profile Recent Labs    07/16/20 0340  CHOL 130  HDL 48  LDLCALC 69  TRIG 65  CHOLHDL 2.7   Thyroid function studies No results for input(s): TSH, T4TOTAL, T3FREE, THYROIDAB in the last 72 hours.  Invalid input(s): FREET3 Anemia work up No results for input(s): VITAMINB12, FOLATE, FERRITIN, TIBC, IRON, RETICCTPCT in the last 72 hours. Urinalysis    Component Value Date/Time   COLORURINE STRAW (A) 07/15/2020 2116   APPEARANCEUR CLEAR 07/15/2020 2116   LABSPEC 1.010 07/15/2020 2116   PHURINE 7.0 07/15/2020 2116   GLUCOSEU NEGATIVE 07/15/2020 2116   HGBUR SMALL (A) 07/15/2020 2116   BILIRUBINUR NEGATIVE 07/15/2020 2116   KETONESUR NEGATIVE 07/15/2020 2116   PROTEINUR NEGATIVE 07/15/2020 2116   UROBILINOGEN 1 06/12/2014 1156   NITRITE NEGATIVE 07/15/2020 2116   LEUKOCYTESUR NEGATIVE 07/15/2020 2116   Sepsis Labs Invalid input(s): PROCALCITONIN,  WBC,  LACTICIDVEN Microbiology Recent Results (from the past 240 hour(s))  Respiratory Panel by RT PCR (Flu A&B, Covid) - Nasopharyngeal Swab     Status: None   Collection Time: 07/15/20   3:39 PM   Specimen: Nasopharyngeal Swab  Result Value Ref Range Status   SARS Coronavirus 2 by RT PCR NEGATIVE NEGATIVE Final    Comment: (NOTE) SARS-CoV-2 target nucleic acids are NOT DETECTED.  The SARS-CoV-2 RNA is generally  detectable in upper respiratoy specimens during the acute phase of infection. The lowest concentration of SARS-CoV-2 viral copies this assay can detect is 131 copies/mL. A negative result does not preclude SARS-Cov-2 infection and should not be used as the sole basis for treatment or other patient management decisions. A negative result may occur with  improper specimen collection/handling, submission of specimen other than nasopharyngeal swab, presence of viral mutation(s) within the areas targeted by this assay, and inadequate number of viral copies (<131 copies/mL). A negative result must be combined with clinical observations, patient history, and epidemiological information. The expected result is Negative.  Fact Sheet for Patients:  PinkCheek.be  Fact Sheet for Healthcare Providers:  GravelBags.it  This test is no t yet approved or cleared by the [place] FDA and  has been authorized for detection and/or diagnosis of SARS-CoV-2 by FDA under an Emergency Use Authorization (EUA). This EUA will remain  in effect (meaning this test can be used) for the duration of the COVID-19 declaration under Section 564(b)(1) of the Act, 21 U.S.C. section 360bbb-3(b)(1), unless the authorization is terminated or revoked sooner.     Influenza A by PCR NEGATIVE NEGATIVE Final   Influenza B by PCR NEGATIVE NEGATIVE Final    Comment: (NOTE) The Xpert Xpress SARS-CoV-2/FLU/RSV assay is intended as an aid in  the diagnosis of influenza from Nasopharyngeal swab specimens and  should not be used as a sole basis for treatment. Nasal washings and  aspirates are unacceptable for Xpert Xpress SARS-CoV-2/FLU/RSV   testing.  Fact Sheet for Patients: PinkCheek.be  Fact Sheet for Healthcare Providers: GravelBags.it  This test is not yet approved or cleared by the [place] FDA and  has been authorized for detection and/or diagnosis of SARS-CoV-2 by  FDA under an Emergency Use Authorization (EUA). This EUA will remain  in effect (meaning this test can be used) for the duration of the  Covid-19 declaration under Section 564(b)(1) of the Act, 21  U.S.C. section 360bbb-3(b)(1), unless the authorization is  terminated or revoked. Performed at [facility], [place] [address], [place], [place] [address]      Time coordinating discharge: 25 minutes      SIGNED:   Edwin Dada, MD  [facility] 07/18/2020, 1:30 PM

## 2020-07-18 NOTE — TOC Transition Note (Signed)
Transition of Care [facility]) - CM/SW Discharge Note   Patient Details  Name: Mathew Miller. MRN: [id number] Date of Birth: 1946/01/03  Transition of Care [facility]) CM/SW Contact:  Pollie Friar, RN Phone Number: 07/18/2020, 2:15 PM   Clinical Narrative:    Pt discharging to [facility]. He will transport via [place]. Wife at the bedside and aware of plan. Bedside RN updated and d/c packet at the desk.  Room; 407P Number for report: [phone]   Final next level of care: Skilled Nursing Facility Barriers to Discharge: No Barriers Identified   Patient Goals and CMS Choice Patient states their goals for this hospitalization and ongoing recovery are:: patient unable to participate in goal setting due to disorientation CMS Medicare.gov Compare Post Acute Care list provided to:: Patient Represenative (must comment) Choice offered to / list presented to : Spouse  Discharge Placement              Patient chooses bed at: [facility] Patient to be transferred to facility by: [facility] [facility] Name of family member notified: wife at bedside Patient and family notified of of transfer: 07/18/20  Discharge Plan and Services     Post Acute Care Choice: [facility]                               Social Determinants of Health (SDOH) Interventions     Readmission Risk Interventions No flowsheet data found.

## 2020-07-21 ENCOUNTER — Other Ambulatory Visit: Payer: Self-pay

## 2020-07-21 DIAGNOSIS — N183 Chronic kidney disease, stage 3 unspecified: Secondary | ICD-10-CM | POA: Diagnosis not present

## 2020-07-21 DIAGNOSIS — E876 Hypokalemia: Secondary | ICD-10-CM | POA: Diagnosis not present

## 2020-07-21 DIAGNOSIS — F0150 Vascular dementia without behavioral disturbance: Secondary | ICD-10-CM | POA: Diagnosis not present

## 2020-07-21 DIAGNOSIS — I6389 Other cerebral infarction: Secondary | ICD-10-CM | POA: Diagnosis not present

## 2020-07-21 DIAGNOSIS — N3281 Overactive bladder: Secondary | ICD-10-CM | POA: Diagnosis not present

## 2020-07-21 DIAGNOSIS — K219 Gastro-esophageal reflux disease without esophagitis: Secondary | ICD-10-CM | POA: Diagnosis not present

## 2020-07-21 DIAGNOSIS — M479 Spondylosis, unspecified: Secondary | ICD-10-CM | POA: Diagnosis not present

## 2020-07-21 DIAGNOSIS — R29898 Other symptoms and signs involving the musculoskeletal system: Secondary | ICD-10-CM | POA: Diagnosis not present

## 2020-07-21 DIAGNOSIS — I951 Orthostatic hypotension: Secondary | ICD-10-CM | POA: Diagnosis not present

## 2020-07-21 NOTE — Patient Outreach (Signed)
[facility]) Care Management  07/21/2020  Mathew Miller Sep 14, 1946 [id number]   Referral Date: 07/21/20 Referral Source: [employer] Report Date of Discharge: 07/18/20 Facility:  [place]   Referral received.  No outreach warranted at this time.  Transition of Care calls being completed via EMMI. RN CM will outreach patient for any red flags received.    Plan: RN CM will close case.    Jone Baseman, RN, MSN [facility] Care Management Care Management Coordinator Direct Line [phone] Toll Free: [phone]  Fax: [phone]

## 2020-07-22 DIAGNOSIS — E782 Mixed hyperlipidemia: Secondary | ICD-10-CM | POA: Diagnosis not present

## 2020-07-22 DIAGNOSIS — M6281 Muscle weakness (generalized): Secondary | ICD-10-CM | POA: Diagnosis not present

## 2020-07-22 DIAGNOSIS — K219 Gastro-esophageal reflux disease without esophagitis: Secondary | ICD-10-CM | POA: Diagnosis not present

## 2020-07-22 DIAGNOSIS — I10 Essential (primary) hypertension: Secondary | ICD-10-CM | POA: Diagnosis not present

## 2020-07-22 DIAGNOSIS — M4802 Spinal stenosis, cervical region: Secondary | ICD-10-CM | POA: Diagnosis not present

## 2020-07-22 DIAGNOSIS — R2681 Unsteadiness on feet: Secondary | ICD-10-CM | POA: Diagnosis not present

## 2020-07-22 DIAGNOSIS — F0390 Unspecified dementia without behavioral disturbance: Secondary | ICD-10-CM | POA: Diagnosis not present

## 2020-07-22 DIAGNOSIS — I951 Orthostatic hypotension: Secondary | ICD-10-CM | POA: Diagnosis not present

## 2020-07-22 DIAGNOSIS — I639 Cerebral infarction, unspecified: Secondary | ICD-10-CM | POA: Diagnosis not present

## 2020-07-25 DIAGNOSIS — M4802 Spinal stenosis, cervical region: Secondary | ICD-10-CM | POA: Diagnosis not present

## 2020-07-25 DIAGNOSIS — I951 Orthostatic hypotension: Secondary | ICD-10-CM | POA: Diagnosis not present

## 2020-07-25 DIAGNOSIS — K219 Gastro-esophageal reflux disease without esophagitis: Secondary | ICD-10-CM | POA: Diagnosis not present

## 2020-07-25 DIAGNOSIS — M6281 Muscle weakness (generalized): Secondary | ICD-10-CM | POA: Diagnosis not present

## 2020-07-25 DIAGNOSIS — E782 Mixed hyperlipidemia: Secondary | ICD-10-CM | POA: Diagnosis not present

## 2020-07-25 DIAGNOSIS — F0390 Unspecified dementia without behavioral disturbance: Secondary | ICD-10-CM | POA: Diagnosis not present

## 2020-07-25 DIAGNOSIS — R2681 Unsteadiness on feet: Secondary | ICD-10-CM | POA: Diagnosis not present

## 2020-07-25 DIAGNOSIS — I10 Essential (primary) hypertension: Secondary | ICD-10-CM | POA: Diagnosis not present

## 2020-07-25 DIAGNOSIS — I639 Cerebral infarction, unspecified: Secondary | ICD-10-CM | POA: Diagnosis not present

## 2020-07-26 ENCOUNTER — Encounter (HOSPITAL_COMMUNITY): Payer: Self-pay

## 2020-07-26 ENCOUNTER — Emergency Department (HOSPITAL_COMMUNITY): Payer: Medicare HMO

## 2020-07-26 ENCOUNTER — Emergency Department (HOSPITAL_COMMUNITY)
Admission: EM | Admit: 2020-07-26 | Discharge: 2020-07-27 | Disposition: A | Discharge status: Skilled Nursing Facility | Payer: Medicare HMO | Attending: Emergency Medicine | Admitting: Emergency Medicine

## 2020-07-26 ENCOUNTER — Other Ambulatory Visit: Payer: Self-pay

## 2020-07-26 DIAGNOSIS — Z7982 Long term (current) use of aspirin: Secondary | ICD-10-CM | POA: Insufficient documentation

## 2020-07-26 DIAGNOSIS — Z79899 Other long term (current) drug therapy: Secondary | ICD-10-CM | POA: Diagnosis not present

## 2020-07-26 DIAGNOSIS — F0390 Unspecified dementia without behavioral disturbance: Secondary | ICD-10-CM | POA: Insufficient documentation

## 2020-07-26 DIAGNOSIS — W19XXXA Unspecified fall, initial encounter: Secondary | ICD-10-CM | POA: Insufficient documentation

## 2020-07-26 DIAGNOSIS — I10 Essential (primary) hypertension: Secondary | ICD-10-CM | POA: Insufficient documentation

## 2020-07-26 DIAGNOSIS — Z043 Encounter for examination and observation following other accident: Secondary | ICD-10-CM | POA: Diagnosis not present

## 2020-07-26 DIAGNOSIS — R410 Disorientation, unspecified: Secondary | ICD-10-CM | POA: Diagnosis not present

## 2020-07-26 NOTE — ED Notes (Signed)
Patient transported to X-ray 

## 2020-07-26 NOTE — ED Triage Notes (Signed)
Pt came in from [facility] with c/o unwitnessed fall. Pt has baseline confusion and is oriented to self. He is not c/o pain.

## 2020-07-26 NOTE — ED Provider Notes (Signed)
[facility] DEPT Provider Note   CSN: [id number] Arrival date & time: 07/26/20  2312     History No chief complaint on file.   Mathew Miller. is a 74 y.o. male.  Patient with severe dementia only oriented to himself presents for an unwitnessed fall from the facility.  No complaints.   Fall This is a new problem. The current episode started 1 to 2 hours ago. The problem occurs constantly. The problem has been resolved. Pertinent negatives include no chest pain, no abdominal pain, no headaches and no shortness of breath. Nothing aggravates the symptoms. Nothing relieves the symptoms. He has tried nothing for the symptoms.       Past Medical History:  Diagnosis Date  . Constipation   . Hyperlipidemia   . Hypertension   . Prediabetes    no meds   . Vitamin D deficiency     Patient Active Problem List   Diagnosis Date Noted  . Dementia without behavioral disturbance ([place])   . Palliative care by specialist   . CVA (cerebral vascular accident) ([place]) 07/15/2020  . Hypokalemia   . Left-sided weakness   . B12 deficiency 03/26/2020  . Unstable gait 10/01/2019  . Frequent falls 10/01/2019  . Imbalance 02/27/2018  . BMI 24.0-24.9, adult 02/26/2018  . Acid reflux 02/26/2018  . Memory changes 02/26/2018  . Vitamin D deficiency 03/01/2014  . Goals of care, counseling/discussion 03/01/2014  . Hyperlipidemia   . History of prediabetes   . HTN (hypertension) 10/09/2013    Past Surgical History:  Procedure Laterality Date  . CATARACT EXTRACTION, BILATERAL  2017  . COLONOSCOPY    . NO PAST SURGERIES         Family History  Problem Relation Age of Onset  . Hyperlipidemia Mother   . Glaucoma Mother   . Heart disease Father   . Hypertension Father   . Colon cancer Neg Hx   . Esophageal cancer Neg Hx   . Pancreatic cancer Neg Hx   . Prostate cancer Neg Hx   . Rectal cancer Neg Hx   . Stomach cancer Neg Hx     Social History    Tobacco Use  . Smoking status: Never Smoker  . Smokeless tobacco: Never Used  Vaping Use  . Vaping Use: Never used  Substance Use Topics  . Alcohol use: Yes    Alcohol/week: 0.0 standard drinks    Comment: very Rare /Beer.wine.  . Drug use: No    Home Medications Prior to Admission medications   Medication Sig Start Date End Date Taking? Authorizing Provider  aspirin EC 81 MG tablet Take 1 tablet (81 mg total) by mouth daily. Swallow whole. 07/18/20  Yes Danford, Suann Larry, MD  clopidogrel (PLAVIX) 75 MG tablet Take 1 tablet (75 mg total) by mouth daily. 07/19/20  Yes Danford, Suann Larry, MD  hydrALAZINE (APRESOLINE) 25 MG tablet Take 25 mg by mouth daily.   Yes [provider]  Magnesium 250 MG TABS Take 250 mg by mouth daily.   Yes [provider]  mirabegron ER (MYRBETRIQ) 50 MG TB24 tablet Take 1 tablet Daily for Bladder Patient taking differently: Take 50 mg by mouth daily.  01/09/20  Yes Unk Pinto, MD  omeprazole (PRILOSEC) 20 MG capsule Take 1 capsule   2 x   /day   to Prevent Heartburn & Indigestion Patient taking differently: Take 20 mg by mouth daily.  05/18/20  Yes Unk Pinto, MD  polyethylene glycol (  MIRALAX / GLYCOLAX) 17 g packet Take 17 g by mouth daily. 07/19/20  Yes Danford, Suann Larry, MD  potassium chloride SA (KLOR-CON) 20 MEQ tablet Take 1 tablet 2 x /day for Potassium Patient taking differently: Take 20 mEq by mouth 2 (two) times daily.  12/17/19  Yes Unk Pinto, MD  QUEtiapine (SEROQUEL) 50 MG tablet Take 1 to 2 tablets in late Afternoon for "Sundowning" 07/11/20  Yes Unk Pinto, MD  rosuvastatin (CRESTOR) 5 MG tablet Take 1 tablet Daily for Cholesterol Patient taking differently: Take 5 mg by mouth daily.  11/23/19  Yes Unk Pinto, MD  senna-docusate (GNP STOOL SOFTENER/LAXATIVE) 8.6-50 MG tablet Take 1 tablet Daily for Constipation Patient taking differently: Take 1 tablet by mouth daily.  10/20/19  Yes Unk Pinto, MD  vitamin B-12 (CYANOCOBALAMIN) 500 MCG tablet Dissolve 1 tablet under tongue daily Patient taking differently: Take 500 mcg by mouth daily. Dissolve 1 tablet under tongue daily 06/29/18  Yes Unk Pinto, MD    Allergies    Hydrocodone-acetaminophen, Sudafed [pseudoephedrine hcl], and Tetanus toxoids  Review of Systems   Review of Systems  Unable to perform ROS: Dementia  Respiratory: Negative for shortness of breath.   Cardiovascular: Negative for chest pain.  Gastrointestinal: Negative for abdominal pain.  Neurological: Negative for headaches.    Physical Exam Updated Vital Signs BP (!) 184/104 (BP Location: Right Arm)   Pulse 90   Temp 98.6 F (37 C) (Oral)   Resp 18   Ht 6\' 2"  (1.88 m)   Wt 74.8 kg   SpO2 99%   BMI 21.18 kg/m   Physical Exam Vitals and nursing note reviewed.  Constitutional:      Appearance: He is well-developed.  HENT:     Head: Normocephalic and atraumatic.     Nose: No congestion or rhinorrhea.     Mouth/Throat:     Mouth: Mucous membranes are moist.     Pharynx: Oropharynx is clear.  Eyes:     Pupils: Pupils are equal, round, and reactive to light.  Cardiovascular:     Rate and Rhythm: Normal rate.  Pulmonary:     Effort: Pulmonary effort is normal. No respiratory distress.  Abdominal:     General: There is no distension.  Musculoskeletal:        General: No tenderness ( No tenderness to palpation of C/T/L spine, upper extremities, lower extremities,.  No pain with range of motion of hips or palpation of pelvis.  No pain with AP or lateral compression of his chest.). Normal range of motion.     Cervical back: Normal range of motion.  Skin:    General: Skin is warm and dry.     Coloration: Skin is not jaundiced or pale.  Neurological:     General: No focal deficit present.     Mental Status: He is alert.     ED Results / Procedures / Treatments   Labs (all labs ordered are listed, but only abnormal results are  displayed) Labs Reviewed  CBC WITH DIFFERENTIAL/PLATELET - Abnormal; Notable for the following components:      Result Value   WBC 12.4 (*)    Hemoglobin 12.9 (*)    Neutro Abs 9.8 (*)    All other components within normal limits  COMPREHENSIVE METABOLIC PANEL - Abnormal; Notable for the following components:   Glucose, Bld 115 (*)    GFR, Estimated 56 (*)    All other components within normal limits    EKG  None  Radiology DG Pelvis 1-2 Views  Result Date: 07/26/2020 CLINICAL DATA:  74 year old male with fall. EXAM: PELVIS - 1-2 VIEW COMPARISON:  None. FINDINGS: There is no acute fracture or dislocation. Mild bilateral hip arthritic changes. Prostate brachytherapy seeds or fiducial markers. The soft tissues are unremarkable. IMPRESSION: No acute fracture or dislocation. Electronically Signed   By: Anner Crete M.D.   On: 07/26/2020 23:55   CT Head Wo Contrast  Result Date: 07/27/2020 CLINICAL DATA:  Un witnessed fall.  Confusion. EXAM: CT HEAD WITHOUT CONTRAST TECHNIQUE: Contiguous axial images were obtained from the base of the skull through the vertex without intravenous contrast. COMPARISON:  MRI 07/15/2020 FINDINGS: Brain: Normal anatomic configuration. Parenchymal volume loss is commensurate with the patient's age. Mild-to-moderate subcortical and periventricular white matter changes are present likely reflecting the sequela of small vessel ischemia. Remote periventricular white matter infarcts are noted adjacent to the atrium of the left lateral ventricle and frontal horn of the right lateral ventricle, as well as within the right centrum semiovale. No abnormal intra or extra-axial mass lesion or fluid collection. No abnormal mass effect or midline shift. No evidence of acute intracranial hemorrhage or infarct. Ventricular size is normal. Cerebellum unremarkable. Vascular: No asymmetric hyperdense vasculature at the skull base. Skull: Intact Sinuses/Orbits: Paranasal sinuses are  clear. Orbits are unremarkable. Other: Mastoid air cells and middle ear cavities are clear. IMPRESSION: Senescent changes. Multiple remote infarcts. No evidence of acute intracranial hemorrhage or infarct. Electronically Signed   By: Fidela Salisbury MD   On: 07/27/2020 00:15   CT Cervical Spine Wo Contrast  Result Date: 07/27/2020 CLINICAL DATA:  Un witnessed fall EXAM: CT CERVICAL SPINE WITHOUT CONTRAST TECHNIQUE: Multidetector CT imaging of the cervical spine was performed without intravenous contrast. Multiplanar CT image reconstructions were also generated. COMPARISON:  None. FINDINGS: Alignment: Normal alignment.  No listhesis Skull base and vertebrae: The craniocervical junction is unremarkable. Atlantodental interval is normal. No acute fracture of the cervical spine. Lytic lesion within the right parasagittal occipital bone may represent an arachnoid granulation or intraosseous hemangioma. Soft tissues and spinal canal: No prevertebral fluid or swelling. No visible canal hematoma. Disc levels: Review of the sagittal reformats demonstrates intervertebral disc space narrowing and endplate remodeling of P6-P9 in keeping with changes of moderate to severe degenerative disc disease, most severe at C5-6 and C6-7. Vertebral body height has been preserved. Review of the axial images demonstrates multilevel uncovertebral and facet arthrosis, most severe on the right at C4-5 with mild bilateral neural foraminal narrowing at C5-6 secondary to uncovertebral spurring. Posterior disc osteophyte complex at C5-6 results in effacement of the anterior canal space without significant canal stenosis. Central and left parasagittal disc osteophyte complex at C6-7 similarly results in effacement of the anterior canal space without significant canal stenosis. Upper chest: Unremarkable Other: None significant IMPRESSION: No acute fracture or listhesis of the cervical spine. Electronically Signed   By: Fidela Salisbury MD   On:  07/27/2020 00:24   DG Chest Portable 1 View  Result Date: 07/27/2020 CLINICAL DATA:  Un witnessed fall, altered mental status EXAM: PORTABLE CHEST 1 VIEW COMPARISON:  07/15/2020 FINDINGS: There is focal consolidation within the left mid lung zone which may reflect changes of acute lobar pneumonia or potentially pulmonary contusion in this a acutely traumatized patient. No pneumothorax or pleural effusion. Cardiac size within normal limits. Pulmonary vascularity is normal. No acute bone abnormality. IMPRESSION: Focal consolidation within the left mid lung zone, infectious versus posttraumatic. Electronically Signed  By: Fidela Salisbury MD   On: 07/27/2020 00:04    Procedures Procedures (including critical care time)  Medications Ordered in ED Medications - No data to display  ED Course  I have reviewed the triage vital signs and the nursing notes.  Pertinent labs & imaging results that were available during my care of the patient were reviewed by me and considered in my medical decision making (see chart for details).    MDM Rules/Calculators/A&P                         Unwitnessed fall we will do a couple labs to make sure that it was not syncope and an EKG.  We will do a CT of his head and neck he has a c-collar on and it was unwitnessed and he is a poor historian. Workup ok. bp improved. Will dc back to facility.   Final Clinical Impression(s) / ED Diagnoses Final diagnoses:  Fall, initial encounter  Hypertension, unspecified type    Rx / DC Orders ED Discharge Orders    None       Dehlia Kilner, Corene Cornea, MD 07/27/20 [phone]

## 2020-07-27 DIAGNOSIS — I10 Essential (primary) hypertension: Secondary | ICD-10-CM | POA: Diagnosis not present

## 2020-07-27 DIAGNOSIS — R531 Weakness: Secondary | ICD-10-CM | POA: Diagnosis not present

## 2020-07-27 DIAGNOSIS — W19XXXA Unspecified fall, initial encounter: Secondary | ICD-10-CM | POA: Diagnosis not present

## 2020-07-27 DIAGNOSIS — M2550 Pain in unspecified joint: Secondary | ICD-10-CM | POA: Diagnosis not present

## 2020-07-27 DIAGNOSIS — Z7401 Bed confinement status: Secondary | ICD-10-CM | POA: Diagnosis not present

## 2020-07-27 LAB — COMPREHENSIVE METABOLIC PANEL
ALT: 20 U/L (ref 0–44)
AST: 19 U/L (ref 15–41)
Albumin: 4 g/dL (ref 3.5–5.0)
Alkaline Phosphatase: 104 U/L (ref 38–126)
Anion gap: 10 (ref 5–15)
BUN: 21 mg/dL (ref 8–23)
CO2: 23 mmol/L (ref 22–32)
Calcium: 9.1 mg/dL (ref 8.9–10.3)
Chloride: 108 mmol/L (ref 98–111)
Creatinine, Ser: 1.24 mg/dL (ref 0.61–1.24)
GFR, Estimated: 56 mL/min — ABNORMAL LOW (ref >60)
Glucose, Bld: 115 mg/dL — ABNORMAL HIGH (ref 70–99)
Potassium: 4.2 mmol/L (ref 3.5–5.1)
Sodium: 141 mmol/L (ref 135–145)
Total Bilirubin: 1 mg/dL (ref 0.3–1.2)
Total Protein: 6.9 g/dL (ref 6.5–8.1)

## 2020-07-27 LAB — CBC WITH DIFFERENTIAL/PLATELET
Abs Immature Granulocytes: 0.07 10*3/uL (ref 0.00–0.07)
Basophils Absolute: 0.1 10*3/uL (ref 0.0–0.1)
Basophils Relative: 1 %
Eosinophils Absolute: 0.3 10*3/uL (ref 0.0–0.5)
Eosinophils Relative: 2 %
HCT: 39.2 % (ref 39.0–52.0)
Hemoglobin: 12.9 g/dL — ABNORMAL LOW (ref 13.0–17.0)
Immature Granulocytes: 1 %
Lymphocytes Relative: 9 %
Lymphs Abs: 1.1 10*3/uL (ref 0.7–4.0)
MCH: 30.4 pg (ref 26.0–34.0)
MCHC: 32.9 g/dL (ref 30.0–36.0)
MCV: 92.2 fL (ref 80.0–100.0)
Monocytes Absolute: 1 10*3/uL (ref 0.1–1.0)
Monocytes Relative: 8 %
Neutro Abs: 9.8 10*3/uL — ABNORMAL HIGH (ref 1.7–7.7)
Neutrophils Relative %: 79 %
Platelets: 202 10*3/uL (ref 150–400)
RBC: 4.25 MIL/uL (ref 4.22–5.81)
RDW: 14.6 % (ref 11.5–15.5)
WBC: 12.4 10*3/uL — ABNORMAL HIGH (ref 4.0–10.5)
nRBC: 0 % (ref 0.0–0.2)

## 2020-07-27 NOTE — ED Notes (Signed)
[facility] called for pt transport. 

## 2020-07-27 NOTE — ED Notes (Signed)
[facility] present for pt transport. Report given

## 2020-07-28 ENCOUNTER — Telehealth: Payer: Self-pay | Admitting: Internal Medicine

## 2020-07-28 DIAGNOSIS — F0390 Unspecified dementia without behavioral disturbance: Secondary | ICD-10-CM | POA: Diagnosis not present

## 2020-07-28 DIAGNOSIS — K219 Gastro-esophageal reflux disease without esophagitis: Secondary | ICD-10-CM | POA: Diagnosis not present

## 2020-07-28 DIAGNOSIS — I951 Orthostatic hypotension: Secondary | ICD-10-CM | POA: Diagnosis not present

## 2020-07-28 DIAGNOSIS — R2681 Unsteadiness on feet: Secondary | ICD-10-CM | POA: Diagnosis not present

## 2020-07-28 DIAGNOSIS — I161 Hypertensive emergency: Secondary | ICD-10-CM | POA: Diagnosis not present

## 2020-07-28 DIAGNOSIS — M4802 Spinal stenosis, cervical region: Secondary | ICD-10-CM | POA: Diagnosis not present

## 2020-07-28 DIAGNOSIS — I639 Cerebral infarction, unspecified: Secondary | ICD-10-CM | POA: Diagnosis not present

## 2020-07-28 DIAGNOSIS — E782 Mixed hyperlipidemia: Secondary | ICD-10-CM | POA: Diagnosis not present

## 2020-07-28 DIAGNOSIS — I10 Essential (primary) hypertension: Secondary | ICD-10-CM | POA: Diagnosis not present

## 2020-07-28 DIAGNOSIS — R55 Syncope and collapse: Secondary | ICD-10-CM | POA: Diagnosis not present

## 2020-07-28 DIAGNOSIS — M6281 Muscle weakness (generalized): Secondary | ICD-10-CM | POA: Diagnosis not present

## 2020-07-28 NOTE — Telephone Encounter (Signed)
Wife called to request DME bedside portable toilet. Ok per Dr Unk Pinto, order sent via [employer] w/ [facility]- delivery to home.

## 2020-07-29 DIAGNOSIS — G901 Familial dysautonomia [Riley-Day]: Secondary | ICD-10-CM | POA: Diagnosis not present

## 2020-07-29 DIAGNOSIS — I951 Orthostatic hypotension: Secondary | ICD-10-CM | POA: Diagnosis not present

## 2020-07-29 DIAGNOSIS — G8194 Hemiplegia, unspecified affecting left nondominant side: Secondary | ICD-10-CM | POA: Diagnosis not present

## 2020-07-29 DIAGNOSIS — I639 Cerebral infarction, unspecified: Secondary | ICD-10-CM | POA: Diagnosis not present

## 2020-07-30 DIAGNOSIS — R2681 Unsteadiness on feet: Secondary | ICD-10-CM | POA: Diagnosis not present

## 2020-07-30 DIAGNOSIS — Z9181 History of falling: Secondary | ICD-10-CM | POA: Diagnosis not present

## 2020-07-30 DIAGNOSIS — E782 Mixed hyperlipidemia: Secondary | ICD-10-CM | POA: Diagnosis not present

## 2020-07-30 DIAGNOSIS — I951 Orthostatic hypotension: Secondary | ICD-10-CM | POA: Diagnosis not present

## 2020-07-30 DIAGNOSIS — M6281 Muscle weakness (generalized): Secondary | ICD-10-CM | POA: Diagnosis not present

## 2020-07-30 DIAGNOSIS — I10 Essential (primary) hypertension: Secondary | ICD-10-CM | POA: Diagnosis not present

## 2020-07-30 DIAGNOSIS — I639 Cerebral infarction, unspecified: Secondary | ICD-10-CM | POA: Diagnosis not present

## 2020-07-30 DIAGNOSIS — K219 Gastro-esophageal reflux disease without esophagitis: Secondary | ICD-10-CM | POA: Diagnosis not present

## 2020-07-30 DIAGNOSIS — M4802 Spinal stenosis, cervical region: Secondary | ICD-10-CM | POA: Diagnosis not present

## 2020-07-30 DIAGNOSIS — F0390 Unspecified dementia without behavioral disturbance: Secondary | ICD-10-CM | POA: Diagnosis not present

## 2020-08-01 DIAGNOSIS — K219 Gastro-esophageal reflux disease without esophagitis: Secondary | ICD-10-CM | POA: Diagnosis not present

## 2020-08-01 DIAGNOSIS — M4802 Spinal stenosis, cervical region: Secondary | ICD-10-CM | POA: Diagnosis not present

## 2020-08-01 DIAGNOSIS — M6281 Muscle weakness (generalized): Secondary | ICD-10-CM | POA: Diagnosis not present

## 2020-08-01 DIAGNOSIS — I951 Orthostatic hypotension: Secondary | ICD-10-CM | POA: Diagnosis not present

## 2020-08-01 DIAGNOSIS — I639 Cerebral infarction, unspecified: Secondary | ICD-10-CM | POA: Diagnosis not present

## 2020-08-01 DIAGNOSIS — R2681 Unsteadiness on feet: Secondary | ICD-10-CM | POA: Diagnosis not present

## 2020-08-01 DIAGNOSIS — F0390 Unspecified dementia without behavioral disturbance: Secondary | ICD-10-CM | POA: Diagnosis not present

## 2020-08-01 DIAGNOSIS — I10 Essential (primary) hypertension: Secondary | ICD-10-CM | POA: Diagnosis not present

## 2020-08-01 DIAGNOSIS — E782 Mixed hyperlipidemia: Secondary | ICD-10-CM | POA: Diagnosis not present

## 2020-08-05 DIAGNOSIS — E782 Mixed hyperlipidemia: Secondary | ICD-10-CM | POA: Diagnosis not present

## 2020-08-05 DIAGNOSIS — M4802 Spinal stenosis, cervical region: Secondary | ICD-10-CM | POA: Diagnosis not present

## 2020-08-05 DIAGNOSIS — F0390 Unspecified dementia without behavioral disturbance: Secondary | ICD-10-CM | POA: Diagnosis not present

## 2020-08-05 DIAGNOSIS — I10 Essential (primary) hypertension: Secondary | ICD-10-CM | POA: Diagnosis not present

## 2020-08-05 DIAGNOSIS — I639 Cerebral infarction, unspecified: Secondary | ICD-10-CM | POA: Diagnosis not present

## 2020-08-05 DIAGNOSIS — I951 Orthostatic hypotension: Secondary | ICD-10-CM | POA: Diagnosis not present

## 2020-08-05 DIAGNOSIS — R2681 Unsteadiness on feet: Secondary | ICD-10-CM | POA: Diagnosis not present

## 2020-08-05 DIAGNOSIS — M6281 Muscle weakness (generalized): Secondary | ICD-10-CM | POA: Diagnosis not present

## 2020-08-05 DIAGNOSIS — K219 Gastro-esophageal reflux disease without esophagitis: Secondary | ICD-10-CM | POA: Diagnosis not present

## 2020-08-07 DIAGNOSIS — F0151 Vascular dementia with behavioral disturbance: Secondary | ICD-10-CM | POA: Diagnosis not present

## 2020-08-07 DIAGNOSIS — U071 COVID-19: Secondary | ICD-10-CM | POA: Diagnosis not present

## 2020-08-07 DIAGNOSIS — I951 Orthostatic hypotension: Secondary | ICD-10-CM | POA: Diagnosis not present

## 2020-08-07 DIAGNOSIS — R0989 Other specified symptoms and signs involving the circulatory and respiratory systems: Secondary | ICD-10-CM | POA: Diagnosis not present

## 2020-08-07 DIAGNOSIS — R296 Repeated falls: Secondary | ICD-10-CM | POA: Diagnosis not present

## 2020-08-08 DIAGNOSIS — K219 Gastro-esophageal reflux disease without esophagitis: Secondary | ICD-10-CM | POA: Diagnosis not present

## 2020-08-08 DIAGNOSIS — R296 Repeated falls: Secondary | ICD-10-CM | POA: Diagnosis not present

## 2020-08-08 DIAGNOSIS — I10 Essential (primary) hypertension: Secondary | ICD-10-CM | POA: Diagnosis not present

## 2020-08-08 DIAGNOSIS — R2681 Unsteadiness on feet: Secondary | ICD-10-CM | POA: Diagnosis not present

## 2020-08-08 DIAGNOSIS — F0150 Vascular dementia without behavioral disturbance: Secondary | ICD-10-CM | POA: Diagnosis not present

## 2020-08-08 DIAGNOSIS — E782 Mixed hyperlipidemia: Secondary | ICD-10-CM | POA: Diagnosis not present

## 2020-08-08 DIAGNOSIS — M6281 Muscle weakness (generalized): Secondary | ICD-10-CM | POA: Diagnosis not present

## 2020-08-08 DIAGNOSIS — I639 Cerebral infarction, unspecified: Secondary | ICD-10-CM | POA: Diagnosis not present

## 2020-08-08 DIAGNOSIS — F0390 Unspecified dementia without behavioral disturbance: Secondary | ICD-10-CM | POA: Diagnosis not present

## 2020-08-08 DIAGNOSIS — M4802 Spinal stenosis, cervical region: Secondary | ICD-10-CM | POA: Diagnosis not present

## 2020-08-08 DIAGNOSIS — I951 Orthostatic hypotension: Secondary | ICD-10-CM | POA: Diagnosis not present

## 2020-08-12 DIAGNOSIS — Z029 Encounter for administrative examinations, unspecified: Secondary | ICD-10-CM | POA: Diagnosis not present

## 2020-08-12 DIAGNOSIS — E876 Hypokalemia: Secondary | ICD-10-CM | POA: Diagnosis not present

## 2020-08-12 DIAGNOSIS — R2681 Unsteadiness on feet: Secondary | ICD-10-CM | POA: Diagnosis not present

## 2020-08-12 DIAGNOSIS — F0280 Dementia in other diseases classified elsewhere without behavioral disturbance: Secondary | ICD-10-CM | POA: Diagnosis not present

## 2020-08-12 DIAGNOSIS — I10 Essential (primary) hypertension: Secondary | ICD-10-CM | POA: Diagnosis not present

## 2020-08-12 DIAGNOSIS — E782 Mixed hyperlipidemia: Secondary | ICD-10-CM | POA: Diagnosis not present

## 2020-08-12 DIAGNOSIS — I6389 Other cerebral infarction: Secondary | ICD-10-CM | POA: Diagnosis not present

## 2020-08-12 DIAGNOSIS — F0390 Unspecified dementia without behavioral disturbance: Secondary | ICD-10-CM | POA: Diagnosis not present

## 2020-08-12 DIAGNOSIS — M4802 Spinal stenosis, cervical region: Secondary | ICD-10-CM | POA: Diagnosis not present

## 2020-08-12 DIAGNOSIS — G8194 Hemiplegia, unspecified affecting left nondominant side: Secondary | ICD-10-CM | POA: Diagnosis not present

## 2020-08-12 DIAGNOSIS — K219 Gastro-esophageal reflux disease without esophagitis: Secondary | ICD-10-CM | POA: Diagnosis not present

## 2020-08-12 DIAGNOSIS — I639 Cerebral infarction, unspecified: Secondary | ICD-10-CM | POA: Diagnosis not present

## 2020-08-12 DIAGNOSIS — I951 Orthostatic hypotension: Secondary | ICD-10-CM | POA: Diagnosis not present

## 2020-08-12 DIAGNOSIS — M6281 Muscle weakness (generalized): Secondary | ICD-10-CM | POA: Diagnosis not present

## 2020-08-13 DIAGNOSIS — E782 Mixed hyperlipidemia: Secondary | ICD-10-CM | POA: Diagnosis not present

## 2020-08-13 DIAGNOSIS — M6281 Muscle weakness (generalized): Secondary | ICD-10-CM | POA: Diagnosis not present

## 2020-08-13 DIAGNOSIS — E876 Hypokalemia: Secondary | ICD-10-CM | POA: Diagnosis not present

## 2020-08-13 DIAGNOSIS — R2681 Unsteadiness on feet: Secondary | ICD-10-CM | POA: Diagnosis not present

## 2020-08-13 DIAGNOSIS — M4802 Spinal stenosis, cervical region: Secondary | ICD-10-CM | POA: Diagnosis not present

## 2020-08-13 DIAGNOSIS — E1169 Type 2 diabetes mellitus with other specified complication: Secondary | ICD-10-CM | POA: Diagnosis not present

## 2020-08-13 DIAGNOSIS — I639 Cerebral infarction, unspecified: Secondary | ICD-10-CM | POA: Diagnosis not present

## 2020-08-13 DIAGNOSIS — I951 Orthostatic hypotension: Secondary | ICD-10-CM | POA: Diagnosis not present

## 2020-08-13 DIAGNOSIS — F0390 Unspecified dementia without behavioral disturbance: Secondary | ICD-10-CM | POA: Diagnosis not present

## 2020-08-13 DIAGNOSIS — K219 Gastro-esophageal reflux disease without esophagitis: Secondary | ICD-10-CM | POA: Diagnosis not present

## 2020-08-13 DIAGNOSIS — I10 Essential (primary) hypertension: Secondary | ICD-10-CM | POA: Diagnosis not present

## 2020-08-13 DIAGNOSIS — E785 Hyperlipidemia, unspecified: Secondary | ICD-10-CM | POA: Diagnosis not present

## 2020-08-14 ENCOUNTER — Encounter: Payer: Medicare HMO | Admitting: Internal Medicine

## 2020-08-14 DIAGNOSIS — E876 Hypokalemia: Secondary | ICD-10-CM | POA: Diagnosis not present

## 2020-08-14 DIAGNOSIS — N183 Chronic kidney disease, stage 3 unspecified: Secondary | ICD-10-CM | POA: Diagnosis not present

## 2020-08-14 DIAGNOSIS — K219 Gastro-esophageal reflux disease without esophagitis: Secondary | ICD-10-CM | POA: Diagnosis not present

## 2020-08-14 DIAGNOSIS — R2681 Unsteadiness on feet: Secondary | ICD-10-CM | POA: Diagnosis not present

## 2020-08-14 DIAGNOSIS — R4181 Age-related cognitive decline: Secondary | ICD-10-CM | POA: Diagnosis not present

## 2020-08-14 DIAGNOSIS — I10 Essential (primary) hypertension: Secondary | ICD-10-CM | POA: Diagnosis not present

## 2020-08-14 DIAGNOSIS — D72828 Other elevated white blood cell count: Secondary | ICD-10-CM | POA: Diagnosis not present

## 2020-08-14 DIAGNOSIS — F0280 Dementia in other diseases classified elsewhere without behavioral disturbance: Secondary | ICD-10-CM | POA: Diagnosis not present

## 2020-08-14 DIAGNOSIS — N390 Urinary tract infection, site not specified: Secondary | ICD-10-CM | POA: Diagnosis not present

## 2020-08-14 DIAGNOSIS — G8194 Hemiplegia, unspecified affecting left nondominant side: Secondary | ICD-10-CM | POA: Diagnosis not present

## 2020-08-14 DIAGNOSIS — I6389 Other cerebral infarction: Secondary | ICD-10-CM | POA: Diagnosis not present

## 2020-08-15 ENCOUNTER — Other Ambulatory Visit: Payer: Self-pay

## 2020-08-15 NOTE — Patient Outreach (Signed)
[facility]) Care Management  08/15/2020  Mathew Miller December 01, 1945 [id number]   Referral Date: 08/15/20 Referral Source: [employer] Report Date of Discharge: 08/14/20 Facility:  [place]: [facility]   Referral received.  No outreach warranted at this time.  Transition of Care calls being completed via EMMI. RN CM will outreach patient for any red flags received.    Plan: RN CM will close case.    Jone Baseman, RN, MSN [facility] Care Management Care Management Coordinator Direct Line [phone] Toll Free: [phone]  Fax: [phone]

## 2020-08-20 DIAGNOSIS — F0390 Unspecified dementia without behavioral disturbance: Secondary | ICD-10-CM | POA: Diagnosis not present

## 2020-08-20 DIAGNOSIS — E559 Vitamin D deficiency, unspecified: Secondary | ICD-10-CM | POA: Diagnosis not present

## 2020-08-20 DIAGNOSIS — I951 Orthostatic hypotension: Secondary | ICD-10-CM | POA: Diagnosis not present

## 2020-08-20 DIAGNOSIS — E785 Hyperlipidemia, unspecified: Secondary | ICD-10-CM | POA: Diagnosis not present

## 2020-08-20 DIAGNOSIS — E1122 Type 2 diabetes mellitus with diabetic chronic kidney disease: Secondary | ICD-10-CM | POA: Diagnosis not present

## 2020-08-20 DIAGNOSIS — E876 Hypokalemia: Secondary | ICD-10-CM | POA: Diagnosis not present

## 2020-08-20 DIAGNOSIS — N183 Chronic kidney disease, stage 3 unspecified: Secondary | ICD-10-CM | POA: Diagnosis not present

## 2020-08-20 DIAGNOSIS — I69354 Hemiplegia and hemiparesis following cerebral infarction affecting left non-dominant side: Secondary | ICD-10-CM | POA: Diagnosis not present

## 2020-08-20 DIAGNOSIS — I129 Hypertensive chronic kidney disease with stage 1 through stage 4 chronic kidney disease, or unspecified chronic kidney disease: Secondary | ICD-10-CM | POA: Diagnosis not present

## 2020-08-21 ENCOUNTER — Other Ambulatory Visit: Payer: Self-pay | Admitting: Internal Medicine

## 2020-08-21 ENCOUNTER — Telehealth: Payer: Self-pay | Admitting: *Deleted

## 2020-08-21 ENCOUNTER — Ambulatory Visit: Payer: Medicare HMO | Admitting: Internal Medicine

## 2020-08-21 DIAGNOSIS — I951 Orthostatic hypotension: Secondary | ICD-10-CM

## 2020-08-21 DIAGNOSIS — Z79899 Other long term (current) drug therapy: Secondary | ICD-10-CM

## 2020-08-21 DIAGNOSIS — R0989 Other specified symptoms and signs involving the circulatory and respiratory systems: Secondary | ICD-10-CM

## 2020-08-21 MED ORDER — HYDRALAZINE HCL 25 MG PO TABS: ORAL_TABLET | ORAL | 0 refills | Status: DC

## 2020-08-21 MED ORDER — AMLODIPINE BESYLATE 5 MG PO TABS: ORAL_TABLET | ORAL | 0 refills | Status: DC

## 2020-08-21 NOTE — Telephone Encounter (Signed)
Left message to inform [facility] PT, it is OK for PT 1 time a week for 9 weeks and OK for social worker evaluation, per Dr Melford Aase.

## 2020-08-21 NOTE — Progress Notes (Signed)
[facility] Follow-Up     This very nice 74 y.o.male was admitted to the hospital on 07/15/2020   and patient was discharged from the hospital on  07/18/2020 and transferred to [facility] place SNF til              . The patient now presents for follow up for transition from recent  [facility] stay.  The day after discharge  our clinical staff contacted the patient to assure stability and schedule a follow up appointment. The discharge summary, medications and diagnostic test results were reviewed before meeting with the patient. The patient was admitted for:      Hospitalization discharge instructions and medications  are reconciled with the patient.      Patient is also followed with Hypertension, Hyperlipidemia, Pre-Diabetes and Vitamin D Deficiency.      Patient is treated for HTN & BP has been controlled at home. Today's  . Patient has had no complaints of any cardiac type chest pain, palpitations, dyspnea/orthopnea/PND, dizziness, claudication, or dependent  edema.     Hyperlipidemia is controlled with diet & meds. Patient denies myalgias or other med SE's. Last Lipids were  Lab Results  Component Value Date   CHOL 130 07/16/2020   HDL 48 07/16/2020   LDLCALC 69 07/16/2020   TRIG 65 07/16/2020   CHOLHDL 2.7 07/16/2020      Also, the patient has history of T2_NIDDM PreDiabetes and has had no symptoms of reactive hypoglycemia, diabetic polys, paresthesias or visual blurring.  Last A1c was  Lab Results  Component Value Date   HGBA1C 4.9 07/16/2020      Further, the patient also has history of Vitamin D Deficiency and supplements vitamin D without any suspected side-effects. Last vitamin D was   Lab Results  Component Value Date   VD25OH 76 12/20/2019

## 2020-08-21 NOTE — Telephone Encounter (Signed)
Returned call regarding home nurse visits. OK to visit patient 2 times a week for 2 weeks and then 1 time a week for 6 weeks, per Dr Melford Aase.

## 2020-08-22 DIAGNOSIS — I69354 Hemiplegia and hemiparesis following cerebral infarction affecting left non-dominant side: Secondary | ICD-10-CM | POA: Diagnosis not present

## 2020-08-22 DIAGNOSIS — E876 Hypokalemia: Secondary | ICD-10-CM | POA: Diagnosis not present

## 2020-08-22 DIAGNOSIS — E559 Vitamin D deficiency, unspecified: Secondary | ICD-10-CM | POA: Diagnosis not present

## 2020-08-22 DIAGNOSIS — I129 Hypertensive chronic kidney disease with stage 1 through stage 4 chronic kidney disease, or unspecified chronic kidney disease: Secondary | ICD-10-CM | POA: Diagnosis not present

## 2020-08-22 DIAGNOSIS — I951 Orthostatic hypotension: Secondary | ICD-10-CM | POA: Diagnosis not present

## 2020-08-22 DIAGNOSIS — E1122 Type 2 diabetes mellitus with diabetic chronic kidney disease: Secondary | ICD-10-CM | POA: Diagnosis not present

## 2020-08-22 DIAGNOSIS — F0390 Unspecified dementia without behavioral disturbance: Secondary | ICD-10-CM | POA: Diagnosis not present

## 2020-08-22 DIAGNOSIS — N183 Chronic kidney disease, stage 3 unspecified: Secondary | ICD-10-CM | POA: Diagnosis not present

## 2020-08-22 DIAGNOSIS — E785 Hyperlipidemia, unspecified: Secondary | ICD-10-CM | POA: Diagnosis not present

## 2020-08-25 ENCOUNTER — Telehealth: Payer: Self-pay | Admitting: *Deleted

## 2020-08-25 NOTE — Telephone Encounter (Signed)
Spouse called and questioned dose change of Hydralazine 50 mg to 25 mg, at refill. Patient only takes Hydralazine 50 mg when systolic BP is 540 or above, per nursing home discharge. Per Dr Melford Aase, spouse advised to give the patient 25 mg daily and check his BP. Patient can take an extra 25 mg if BP is above 170. Spouse is aware.

## 2020-08-26 DIAGNOSIS — E1122 Type 2 diabetes mellitus with diabetic chronic kidney disease: Secondary | ICD-10-CM | POA: Diagnosis not present

## 2020-08-26 DIAGNOSIS — E785 Hyperlipidemia, unspecified: Secondary | ICD-10-CM | POA: Diagnosis not present

## 2020-08-26 DIAGNOSIS — I129 Hypertensive chronic kidney disease with stage 1 through stage 4 chronic kidney disease, or unspecified chronic kidney disease: Secondary | ICD-10-CM | POA: Diagnosis not present

## 2020-08-26 DIAGNOSIS — I69354 Hemiplegia and hemiparesis following cerebral infarction affecting left non-dominant side: Secondary | ICD-10-CM | POA: Diagnosis not present

## 2020-08-26 DIAGNOSIS — E559 Vitamin D deficiency, unspecified: Secondary | ICD-10-CM | POA: Diagnosis not present

## 2020-08-26 DIAGNOSIS — E876 Hypokalemia: Secondary | ICD-10-CM | POA: Diagnosis not present

## 2020-08-26 DIAGNOSIS — N183 Chronic kidney disease, stage 3 unspecified: Secondary | ICD-10-CM | POA: Diagnosis not present

## 2020-08-26 DIAGNOSIS — F0390 Unspecified dementia without behavioral disturbance: Secondary | ICD-10-CM | POA: Diagnosis not present

## 2020-08-26 DIAGNOSIS — I951 Orthostatic hypotension: Secondary | ICD-10-CM | POA: Diagnosis not present

## 2020-08-27 ENCOUNTER — Telehealth: Payer: Self-pay | Admitting: *Deleted

## 2020-08-27 DIAGNOSIS — I69354 Hemiplegia and hemiparesis following cerebral infarction affecting left non-dominant side: Secondary | ICD-10-CM | POA: Diagnosis not present

## 2020-08-27 DIAGNOSIS — E559 Vitamin D deficiency, unspecified: Secondary | ICD-10-CM | POA: Diagnosis not present

## 2020-08-27 DIAGNOSIS — E785 Hyperlipidemia, unspecified: Secondary | ICD-10-CM | POA: Diagnosis not present

## 2020-08-27 DIAGNOSIS — F0390 Unspecified dementia without behavioral disturbance: Secondary | ICD-10-CM | POA: Diagnosis not present

## 2020-08-27 DIAGNOSIS — I129 Hypertensive chronic kidney disease with stage 1 through stage 4 chronic kidney disease, or unspecified chronic kidney disease: Secondary | ICD-10-CM | POA: Diagnosis not present

## 2020-08-27 DIAGNOSIS — N183 Chronic kidney disease, stage 3 unspecified: Secondary | ICD-10-CM | POA: Diagnosis not present

## 2020-08-27 DIAGNOSIS — E1122 Type 2 diabetes mellitus with diabetic chronic kidney disease: Secondary | ICD-10-CM | POA: Diagnosis not present

## 2020-08-27 DIAGNOSIS — E876 Hypokalemia: Secondary | ICD-10-CM | POA: Diagnosis not present

## 2020-08-27 DIAGNOSIS — I951 Orthostatic hypotension: Secondary | ICD-10-CM | POA: Diagnosis not present

## 2020-08-27 NOTE — Telephone Encounter (Signed)
Returned a call to [facility] and spoke with [employer]. Per Dr Melford Aase, the nurse was advised to inform the patient' wife to hold the Hydralazine 25 mg for now due to his low BP. [facility] is aware and will continue to monitor his BP. Patient BP seated was 90/58 and standing was 76/52.

## 2020-08-28 DIAGNOSIS — I129 Hypertensive chronic kidney disease with stage 1 through stage 4 chronic kidney disease, or unspecified chronic kidney disease: Secondary | ICD-10-CM | POA: Diagnosis not present

## 2020-08-28 DIAGNOSIS — N183 Chronic kidney disease, stage 3 unspecified: Secondary | ICD-10-CM | POA: Diagnosis not present

## 2020-08-28 DIAGNOSIS — E785 Hyperlipidemia, unspecified: Secondary | ICD-10-CM | POA: Diagnosis not present

## 2020-08-28 DIAGNOSIS — I69354 Hemiplegia and hemiparesis following cerebral infarction affecting left non-dominant side: Secondary | ICD-10-CM | POA: Diagnosis not present

## 2020-08-28 DIAGNOSIS — I951 Orthostatic hypotension: Secondary | ICD-10-CM | POA: Diagnosis not present

## 2020-08-28 DIAGNOSIS — E559 Vitamin D deficiency, unspecified: Secondary | ICD-10-CM | POA: Diagnosis not present

## 2020-08-28 DIAGNOSIS — E876 Hypokalemia: Secondary | ICD-10-CM | POA: Diagnosis not present

## 2020-08-28 DIAGNOSIS — F0390 Unspecified dementia without behavioral disturbance: Secondary | ICD-10-CM | POA: Diagnosis not present

## 2020-08-28 DIAGNOSIS — E1122 Type 2 diabetes mellitus with diabetic chronic kidney disease: Secondary | ICD-10-CM | POA: Diagnosis not present

## 2020-08-29 DIAGNOSIS — I129 Hypertensive chronic kidney disease with stage 1 through stage 4 chronic kidney disease, or unspecified chronic kidney disease: Secondary | ICD-10-CM | POA: Diagnosis not present

## 2020-08-29 DIAGNOSIS — I951 Orthostatic hypotension: Secondary | ICD-10-CM | POA: Diagnosis not present

## 2020-08-29 DIAGNOSIS — E1122 Type 2 diabetes mellitus with diabetic chronic kidney disease: Secondary | ICD-10-CM | POA: Diagnosis not present

## 2020-08-29 DIAGNOSIS — N183 Chronic kidney disease, stage 3 unspecified: Secondary | ICD-10-CM | POA: Diagnosis not present

## 2020-08-29 DIAGNOSIS — E785 Hyperlipidemia, unspecified: Secondary | ICD-10-CM | POA: Diagnosis not present

## 2020-08-29 DIAGNOSIS — I69354 Hemiplegia and hemiparesis following cerebral infarction affecting left non-dominant side: Secondary | ICD-10-CM | POA: Diagnosis not present

## 2020-08-29 DIAGNOSIS — F0390 Unspecified dementia without behavioral disturbance: Secondary | ICD-10-CM | POA: Diagnosis not present

## 2020-08-29 DIAGNOSIS — E559 Vitamin D deficiency, unspecified: Secondary | ICD-10-CM | POA: Diagnosis not present

## 2020-08-29 DIAGNOSIS — E876 Hypokalemia: Secondary | ICD-10-CM | POA: Diagnosis not present

## 2020-09-01 DIAGNOSIS — I69354 Hemiplegia and hemiparesis following cerebral infarction affecting left non-dominant side: Secondary | ICD-10-CM | POA: Diagnosis not present

## 2020-09-01 DIAGNOSIS — E876 Hypokalemia: Secondary | ICD-10-CM | POA: Diagnosis not present

## 2020-09-01 DIAGNOSIS — E1122 Type 2 diabetes mellitus with diabetic chronic kidney disease: Secondary | ICD-10-CM | POA: Diagnosis not present

## 2020-09-01 DIAGNOSIS — F0390 Unspecified dementia without behavioral disturbance: Secondary | ICD-10-CM | POA: Diagnosis not present

## 2020-09-01 DIAGNOSIS — N183 Chronic kidney disease, stage 3 unspecified: Secondary | ICD-10-CM | POA: Diagnosis not present

## 2020-09-01 DIAGNOSIS — I951 Orthostatic hypotension: Secondary | ICD-10-CM | POA: Diagnosis not present

## 2020-09-01 DIAGNOSIS — I129 Hypertensive chronic kidney disease with stage 1 through stage 4 chronic kidney disease, or unspecified chronic kidney disease: Secondary | ICD-10-CM | POA: Diagnosis not present

## 2020-09-01 DIAGNOSIS — E559 Vitamin D deficiency, unspecified: Secondary | ICD-10-CM | POA: Diagnosis not present

## 2020-09-01 DIAGNOSIS — E785 Hyperlipidemia, unspecified: Secondary | ICD-10-CM | POA: Diagnosis not present

## 2020-09-02 DIAGNOSIS — F0390 Unspecified dementia without behavioral disturbance: Secondary | ICD-10-CM | POA: Diagnosis not present

## 2020-09-02 DIAGNOSIS — E1122 Type 2 diabetes mellitus with diabetic chronic kidney disease: Secondary | ICD-10-CM | POA: Diagnosis not present

## 2020-09-02 DIAGNOSIS — E785 Hyperlipidemia, unspecified: Secondary | ICD-10-CM | POA: Diagnosis not present

## 2020-09-02 DIAGNOSIS — I951 Orthostatic hypotension: Secondary | ICD-10-CM | POA: Diagnosis not present

## 2020-09-02 DIAGNOSIS — N183 Chronic kidney disease, stage 3 unspecified: Secondary | ICD-10-CM | POA: Diagnosis not present

## 2020-09-02 DIAGNOSIS — I129 Hypertensive chronic kidney disease with stage 1 through stage 4 chronic kidney disease, or unspecified chronic kidney disease: Secondary | ICD-10-CM | POA: Diagnosis not present

## 2020-09-02 DIAGNOSIS — I69354 Hemiplegia and hemiparesis following cerebral infarction affecting left non-dominant side: Secondary | ICD-10-CM | POA: Diagnosis not present

## 2020-09-02 DIAGNOSIS — E876 Hypokalemia: Secondary | ICD-10-CM | POA: Diagnosis not present

## 2020-09-02 DIAGNOSIS — E559 Vitamin D deficiency, unspecified: Secondary | ICD-10-CM | POA: Diagnosis not present

## 2020-09-03 DIAGNOSIS — E876 Hypokalemia: Secondary | ICD-10-CM | POA: Diagnosis not present

## 2020-09-03 DIAGNOSIS — N183 Chronic kidney disease, stage 3 unspecified: Secondary | ICD-10-CM | POA: Diagnosis not present

## 2020-09-03 DIAGNOSIS — I69354 Hemiplegia and hemiparesis following cerebral infarction affecting left non-dominant side: Secondary | ICD-10-CM | POA: Diagnosis not present

## 2020-09-03 DIAGNOSIS — E559 Vitamin D deficiency, unspecified: Secondary | ICD-10-CM | POA: Diagnosis not present

## 2020-09-03 DIAGNOSIS — I129 Hypertensive chronic kidney disease with stage 1 through stage 4 chronic kidney disease, or unspecified chronic kidney disease: Secondary | ICD-10-CM | POA: Diagnosis not present

## 2020-09-03 DIAGNOSIS — I951 Orthostatic hypotension: Secondary | ICD-10-CM | POA: Diagnosis not present

## 2020-09-03 DIAGNOSIS — F0390 Unspecified dementia without behavioral disturbance: Secondary | ICD-10-CM | POA: Diagnosis not present

## 2020-09-03 DIAGNOSIS — E785 Hyperlipidemia, unspecified: Secondary | ICD-10-CM | POA: Diagnosis not present

## 2020-09-03 DIAGNOSIS — E1122 Type 2 diabetes mellitus with diabetic chronic kidney disease: Secondary | ICD-10-CM | POA: Diagnosis not present

## 2020-09-04 ENCOUNTER — Telehealth: Payer: Self-pay | Admitting: *Deleted

## 2020-09-04 NOTE — Telephone Encounter (Signed)
Social worker attempted in home visit today,but spouse preferred phone visit. Social worker spoke with spouse and gave her information on services available and is goig to mail information to her. Social worker provided spouse with her phone number, if she needs assistance.

## 2020-09-04 NOTE — Progress Notes (Signed)
[place]     This very nice 74 y.o.male was admitted to the hospital on 07/15/2020   and patient was discharged from the hospital on 07/18/2020 and transferred to [facility] place SNF til  28 October (home 20 days). The patient now presents for follow up for transition from recent  SNF stay.  The discharge summary, medications and diagnostic test results were reviewed before meeting with the patient. The patient was admitted for:     . Orthostatic hypotension  . Ischemic stroke of frontal lobe ([employer])  . Hypokalemia  . Vascular dementia without behavioral disturbance ([place])  . AKI (acute kidney injury) ([place])  . Gastroesophageal reflux disease with esophagitis without hemorrhage  . Bladder dysfunction       Patient was hospitalized with Left -sided weakness and Brain MRI brain showed 2 small right frontal lobe white matter infarcts, consistent with small vessel disease.Cervical MRI showed some foraminal disease, which actually correlate with his left-sided arm weakness.  Echocardiogramshowed no cardiogenic source of embolism. Carotid imagingshowed no stenosis. Patient was discharged on low-dose aspirinplus Plavix. Elevated Creatinine on admission  resolved to baseline 0.8 mg/dl with IV fluids and elevated potassium also corrected. Patient was transferred to [facility] place SNF     Hospitalization discharge instructions and medications are reconciled with the patient.      Patient has been  followed with labile Hypertension /Autonomic Hypotension, Hyperlipidemia, Pre-Diabetes and Vitamin D Deficiency.      Patient is treated for HTN/ Hypotension  & BP has been controlled at home. Today's BP is at goal -  124/82. Patient has had no complaints of any cardiac type chest pain, palpitations, dyspnea/orthopnea/PND, dizziness, claudication, or dependent edema.     Hyperlipidemia is controlled with diet & meds. Patient denies myalgias or other med SE's. Last Lipids were at goal:  Lab  Results  Component Value Date   CHOL 130 07/16/2020   HDL 48 07/16/2020   LDLCALC 69 07/16/2020   TRIG 65 07/16/2020   CHOLHDL 2.7 07/16/2020      Also, the patient has history of PreDiabetes and has had no symptoms of reactive hypoglycemia, diabetic polys, paresthesias or visual blurring.  Last A1c was at goal:  Lab Results  Component Value Date   HGBA1C 4.9 07/16/2020      Further, the patient also has history of Vitamin D Deficiency and supplements vitamin D without any suspected side-effects. Last vitamin D was  At goal:  Lab Results  Component Value Date   VD25OH 76 12/20/2019   Current Outpatient Medications on File Prior to Visit  Medication Sig  . amLODipine  5 MG tablet Take      1 tablet      Daily       for BP  . aspirin EC 81 MG tablet Take 1 tablet  daily.   . clopidogrel (PLAVIX) 75 MG  Take 1 tablet daily.  . hydrALAZINE  25 MG tablet Take      1 tablet       Daily       for BP  . Magnesium 250 MG TABS Take 250 mg by mouth daily.  Marland Kitchen MYRBETRIQ 50 MG  Take 1 tablet Daily for Bladder   . Omeprazole  20 MG capsule Take 1 capsule2 x/day   . polyethylene glycol 17 g packet Take 17 g b daily.  . potassium chloride SA 20 MEQ  Take 1 tablet 2 x /day for Potassium  . QUEtiapine  50  MG tablet Take 1 to 2 tablets in late Afternoon for "Sundowning"  . rosuvastatin 5 MG tablet Take 1 tablet Daily for Cholesterol   . senna-docusate   Take 1 tablet Daily for Constipation   . vitamin B-12  500 MCG tablet Dissolve 1 tablet under tongue daily    Allergies  Allergen Reactions  . Hydrocodone-Acetaminophen Nausea Only  . Sudafed [Pseudoephedrine Hcl]     Urinary retention  . Tetanus Toxoids     Itching, burning feeling, ran a fever   PMHx:   Past Medical History:  Diagnosis Date  . Constipation   . Hyperlipidemia   . Hypertension   . Prediabetes    no meds   . Vitamin D deficiency    Immunization History  Administered Date(s) Administered  . Influenza Split  08/06/2013  . Influenza, High Dose Seasonal PF 07/30/2014, 07/29/2015, 08/11/2016, 07/20/2017, 07/18/2018  . Influenza-Unspecified 07/18/2018, 06/24/2019  . PFIZER SARS-COV-2 Vaccination 11/04/2019, 11/25/2019  . Pneumococcal Conjugate-13 04/23/2016  . Pneumococcal Polysaccharide-23 10/21/2009  . Td 10/18/1965  . Zoster 10/19/2011  . Zoster Recombinat (Shingrix) 03/17/2018, 06/08/2018   Past Surgical History:  Procedure Laterality Date  . CATARACT EXTRACTION, BILATERAL  2017  . COLONOSCOPY    . NO PAST SURGERIES     FHx:    Reviewed / unchanged  SHx:    Reviewed / unchanged  Systems Review:  Constitutional: Denies fever, chills, wt changes, headaches, insomnia, fatigue, night sweats, change in appetite. Eyes: Denies redness, blurred vision, diplopia, discharge, itchy, watery eyes.  ENT: Denies discharge, congestion, post nasal drip, epistaxis, sore throat, earache, hearing loss, dental pain, tinnitus, vertigo, sinus pain, snoring.  CV: Denies chest pain, palpitations, irregular heartbeat, syncope, dyspnea, diaphoresis, orthopnea, PND, claudication or edema. Respiratory: denies cough, dyspnea, DOE, pleurisy, hoarseness, laryngitis, wheezing.  Gastrointestinal: Denies dysphagia, odynophagia, heartburn, reflux, water brash, abdominal pain or cramps, nausea, vomiting, bloating, diarrhea, constipation, hematemesis, melena, hematochezia  or hemorrhoids. Genitourinary: Denies dysuria, frequency, urgency, nocturia, hesitancy, discharge, hematuria or flank pain. Musculoskeletal: Denies arthralgias, myalgias, stiffness, jt. swelling, pain, limping or strain/sprain.  Skin: Denies pruritus, rash, hives, warts, acne, eczema or change in skin lesion(s). Neuro: No weakness, tremor, incoordination, spasms, paresthesia or pain. Psychiatric: Denies confusion, memory loss or sensory loss. Endo: Denies change in weight, skin or hair change.  Heme/Lymph: No excessive bleeding, bruising or enlarged lymph  nodes.  Physical Exam  BP 124/82   P 68   T 98.1 F   Wt 150 lb  SpO2 98%   BMI 19.26   Postural Sit    BP 125/84   P  69          &           Stand   BP 112/74    P 68  Appears well nourished, well groomed  and in no distress.  Eyes: PERRLA, EOMs, conjunctiva no swelling or erythema. Sinuses: No frontal/maxillary tenderness ENT/Mouth: EAC's clear, TM's nl w/o erythema, bulging. Nares clear w/o erythema, swelling, exudates. Oropharynx clear without erythema or exudates. Oral hygiene is good. Tongue normal, non obstructing. Hearing intact.  Neck: Supple. Thyroid nl. Car 2+/2+ without bruits, nodes or JVD. Chest: Respirations nl with BS clear & equal w/o rales, rhonchi, wheezing or stridor.  Cor: Heart sounds normal w/ regular rate and rhythm without sig. murmurs, gallops, clicks or rubs. Peripheral pulses normal and equal  without edema.  Abdomen: Soft & bowel sounds normal. Non-tender w/o guarding, rebound, hernias, masses or organomegaly.  Lymphatics: Unremarkable.  Musculoskeletal: Full ROM all peripheral extremities, joint stability, 5/5 strength and normal gait.  Skin: Warm, dry without exposed rashes, lesions or ecchymosis apparent.  Neuro: Cranial nerves intact, reflexes equal bilaterally. Sensory-motor testing grossly intact. Tendon reflexes grossly intact.  Pysch: Alert & oriented x 1.  Insight and judgement very limited . No ideations.  Assessment and Plan:  1. Orthostatic hypotension  - CBC with Differential/Platelet - COMPLETE METABOLIC PANEL WITH GFR - Magnesium  2. Labile hypertension  - Continue medication, monitor blood pressure at home.  - Continue DASH diet. Reminder to go to the ER if any CP,  SOB, nausea, dizziness, severe HA, changes vision/speech.  - CBC with Differential/Platelet - COMPLETE METABOLIC PANEL WITH GFR - Magnesium - TSH  3. Ischemic stroke of frontal lobe ([place])   4. Hypokalemia   - COMPLETE METABOLIC PANEL WITH GFR -  Magnesium  5. Vascular dementia without behavioral disturbance ([place])   6. AKI (acute kidney injury) ([place])  - COMPLETE METABOLIC PANEL WITH GFR  7. Gastroesophageal reflux disease  - CBC with Differential/Platelet  8. Bladder dysfunction   9. Medication management  - CBC with Differential/Platelet - COMPLETE METABOLIC PANEL WITH GFR - Magnesium - TSH        Discussed  regular exercise, BP monitoring, weight control to achieve/maintain BMI less than 25 and discussed meds and SE's. Recommended labs to assess and monitor clinical status with further disposition pending results of labs. Over 30 minutes of exam, counseling, chart review was performed.

## 2020-09-05 ENCOUNTER — Ambulatory Visit (INDEPENDENT_AMBULATORY_CARE_PROVIDER_SITE_OTHER): Payer: Medicare HMO | Admitting: Internal Medicine

## 2020-09-05 ENCOUNTER — Other Ambulatory Visit: Payer: Self-pay

## 2020-09-05 ENCOUNTER — Encounter: Payer: Self-pay | Admitting: Internal Medicine

## 2020-09-05 VITALS — BP 124/82 | HR 68 | Temp 98.1°F | Wt 150.0 lb

## 2020-09-05 DIAGNOSIS — I639 Cerebral infarction, unspecified: Secondary | ICD-10-CM | POA: Diagnosis not present

## 2020-09-05 DIAGNOSIS — K2100 Gastro-esophageal reflux disease with esophagitis, without bleeding: Secondary | ICD-10-CM

## 2020-09-05 DIAGNOSIS — Z79899 Other long term (current) drug therapy: Secondary | ICD-10-CM | POA: Diagnosis not present

## 2020-09-05 DIAGNOSIS — I951 Orthostatic hypotension: Secondary | ICD-10-CM

## 2020-09-05 DIAGNOSIS — E876 Hypokalemia: Secondary | ICD-10-CM | POA: Diagnosis not present

## 2020-09-05 DIAGNOSIS — F0150 Vascular dementia without behavioral disturbance: Secondary | ICD-10-CM

## 2020-09-05 DIAGNOSIS — N179 Acute kidney failure, unspecified: Secondary | ICD-10-CM

## 2020-09-05 DIAGNOSIS — N319 Neuromuscular dysfunction of bladder, unspecified: Secondary | ICD-10-CM | POA: Diagnosis not present

## 2020-09-05 DIAGNOSIS — R0989 Other specified symptoms and signs involving the circulatory and respiratory systems: Secondary | ICD-10-CM

## 2020-09-05 NOTE — Patient Instructions (Signed)

## 2020-09-06 LAB — TSH: TSH: 4.02 mIU/L (ref 0.40–4.50)

## 2020-09-06 LAB — COMPLETE METABOLIC PANEL WITH GFR
AG Ratio: 1.7 (calc) (ref 1.0–2.5)
ALT: 17 U/L (ref 9–46)
AST: 15 U/L (ref 10–35)
Albumin: 3.7 g/dL (ref 3.6–5.1)
Alkaline phosphatase (APISO): 98 U/L (ref 35–144)
BUN/Creatinine Ratio: 14 (calc) (ref 6–22)
BUN: 19 mg/dL (ref 7–25)
CO2: 30 mmol/L (ref 20–32)
Calcium: 9.3 mg/dL (ref 8.6–10.3)
Chloride: 107 mmol/L (ref 98–110)
Creat: 1.4 mg/dL — ABNORMAL HIGH (ref 0.70–1.18)
GFR, Est African American: 56 mL/min/{1.73_m2} — ABNORMAL LOW (ref >=60)
GFR, Est Non African American: 48 mL/min/{1.73_m2} — ABNORMAL LOW (ref >=60)
Globulin: 2.2 g/dL (calc) (ref 1.9–3.7)
Glucose, Bld: 97 mg/dL (ref 65–99)
Potassium: 3.7 mmol/L (ref 3.5–5.3)
Sodium: 144 mmol/L (ref 135–146)
Total Bilirubin: 0.6 mg/dL (ref 0.2–1.2)
Total Protein: 5.9 g/dL — ABNORMAL LOW (ref 6.1–8.1)

## 2020-09-06 LAB — CBC WITH DIFFERENTIAL/PLATELET
Absolute Monocytes: 496 cells/uL (ref 200–950)
Basophils Absolute: 50 cells/uL (ref 0–200)
Basophils Relative: 0.6 %
Eosinophils Absolute: 151 cells/uL (ref 15–500)
Eosinophils Relative: 1.8 %
HCT: 39.3 % (ref 38.5–50.0)
Hemoglobin: 13 g/dL — ABNORMAL LOW (ref 13.2–17.1)
Lymphs Abs: 1159 cells/uL (ref 850–3900)
MCH: 29.8 pg (ref 27.0–33.0)
MCHC: 33.1 g/dL (ref 32.0–36.0)
MCV: 90.1 fL (ref 80.0–100.0)
MPV: 10.6 fL (ref 7.5–12.5)
Monocytes Relative: 5.9 %
Neutro Abs: 6544 cells/uL (ref 1500–7800)
Neutrophils Relative %: 77.9 %
Platelets: 177 10*3/uL (ref 140–400)
RBC: 4.36 10*6/uL (ref 4.20–5.80)
RDW: 13.4 % (ref 11.0–15.0)
Total Lymphocyte: 13.8 %
WBC: 8.4 10*3/uL (ref 3.8–10.8)

## 2020-09-06 LAB — MAGNESIUM: Magnesium: 2.1 mg/dL (ref 1.5–2.5)

## 2020-09-09 DIAGNOSIS — I69354 Hemiplegia and hemiparesis following cerebral infarction affecting left non-dominant side: Secondary | ICD-10-CM | POA: Diagnosis not present

## 2020-09-09 DIAGNOSIS — I951 Orthostatic hypotension: Secondary | ICD-10-CM | POA: Diagnosis not present

## 2020-09-09 DIAGNOSIS — N183 Chronic kidney disease, stage 3 unspecified: Secondary | ICD-10-CM | POA: Diagnosis not present

## 2020-09-09 DIAGNOSIS — E559 Vitamin D deficiency, unspecified: Secondary | ICD-10-CM | POA: Diagnosis not present

## 2020-09-09 DIAGNOSIS — E785 Hyperlipidemia, unspecified: Secondary | ICD-10-CM | POA: Diagnosis not present

## 2020-09-09 DIAGNOSIS — I129 Hypertensive chronic kidney disease with stage 1 through stage 4 chronic kidney disease, or unspecified chronic kidney disease: Secondary | ICD-10-CM | POA: Diagnosis not present

## 2020-09-09 DIAGNOSIS — F0390 Unspecified dementia without behavioral disturbance: Secondary | ICD-10-CM | POA: Diagnosis not present

## 2020-09-09 DIAGNOSIS — E1122 Type 2 diabetes mellitus with diabetic chronic kidney disease: Secondary | ICD-10-CM | POA: Diagnosis not present

## 2020-09-09 DIAGNOSIS — E876 Hypokalemia: Secondary | ICD-10-CM | POA: Diagnosis not present

## 2020-09-12 DIAGNOSIS — I69354 Hemiplegia and hemiparesis following cerebral infarction affecting left non-dominant side: Secondary | ICD-10-CM | POA: Diagnosis not present

## 2020-09-12 DIAGNOSIS — E876 Hypokalemia: Secondary | ICD-10-CM | POA: Diagnosis not present

## 2020-09-12 DIAGNOSIS — N183 Chronic kidney disease, stage 3 unspecified: Secondary | ICD-10-CM | POA: Diagnosis not present

## 2020-09-12 DIAGNOSIS — F0390 Unspecified dementia without behavioral disturbance: Secondary | ICD-10-CM | POA: Diagnosis not present

## 2020-09-12 DIAGNOSIS — I951 Orthostatic hypotension: Secondary | ICD-10-CM | POA: Diagnosis not present

## 2020-09-12 DIAGNOSIS — I129 Hypertensive chronic kidney disease with stage 1 through stage 4 chronic kidney disease, or unspecified chronic kidney disease: Secondary | ICD-10-CM | POA: Diagnosis not present

## 2020-09-12 DIAGNOSIS — E1122 Type 2 diabetes mellitus with diabetic chronic kidney disease: Secondary | ICD-10-CM | POA: Diagnosis not present

## 2020-09-12 DIAGNOSIS — E559 Vitamin D deficiency, unspecified: Secondary | ICD-10-CM | POA: Diagnosis not present

## 2020-09-12 DIAGNOSIS — E785 Hyperlipidemia, unspecified: Secondary | ICD-10-CM | POA: Diagnosis not present

## 2020-09-16 DIAGNOSIS — N183 Chronic kidney disease, stage 3 unspecified: Secondary | ICD-10-CM | POA: Diagnosis not present

## 2020-09-16 DIAGNOSIS — E559 Vitamin D deficiency, unspecified: Secondary | ICD-10-CM | POA: Diagnosis not present

## 2020-09-16 DIAGNOSIS — I69354 Hemiplegia and hemiparesis following cerebral infarction affecting left non-dominant side: Secondary | ICD-10-CM | POA: Diagnosis not present

## 2020-09-16 DIAGNOSIS — E1122 Type 2 diabetes mellitus with diabetic chronic kidney disease: Secondary | ICD-10-CM | POA: Diagnosis not present

## 2020-09-16 DIAGNOSIS — F0390 Unspecified dementia without behavioral disturbance: Secondary | ICD-10-CM | POA: Diagnosis not present

## 2020-09-16 DIAGNOSIS — E876 Hypokalemia: Secondary | ICD-10-CM | POA: Diagnosis not present

## 2020-09-16 DIAGNOSIS — E785 Hyperlipidemia, unspecified: Secondary | ICD-10-CM | POA: Diagnosis not present

## 2020-09-16 DIAGNOSIS — I129 Hypertensive chronic kidney disease with stage 1 through stage 4 chronic kidney disease, or unspecified chronic kidney disease: Secondary | ICD-10-CM | POA: Diagnosis not present

## 2020-09-16 DIAGNOSIS — I951 Orthostatic hypotension: Secondary | ICD-10-CM | POA: Diagnosis not present

## 2020-09-19 DIAGNOSIS — E1122 Type 2 diabetes mellitus with diabetic chronic kidney disease: Secondary | ICD-10-CM | POA: Diagnosis not present

## 2020-09-19 DIAGNOSIS — I951 Orthostatic hypotension: Secondary | ICD-10-CM | POA: Diagnosis not present

## 2020-09-19 DIAGNOSIS — E785 Hyperlipidemia, unspecified: Secondary | ICD-10-CM | POA: Diagnosis not present

## 2020-09-19 DIAGNOSIS — E559 Vitamin D deficiency, unspecified: Secondary | ICD-10-CM | POA: Diagnosis not present

## 2020-09-19 DIAGNOSIS — N183 Chronic kidney disease, stage 3 unspecified: Secondary | ICD-10-CM | POA: Diagnosis not present

## 2020-09-19 DIAGNOSIS — E876 Hypokalemia: Secondary | ICD-10-CM | POA: Diagnosis not present

## 2020-09-19 DIAGNOSIS — I129 Hypertensive chronic kidney disease with stage 1 through stage 4 chronic kidney disease, or unspecified chronic kidney disease: Secondary | ICD-10-CM | POA: Diagnosis not present

## 2020-09-19 DIAGNOSIS — I69354 Hemiplegia and hemiparesis following cerebral infarction affecting left non-dominant side: Secondary | ICD-10-CM | POA: Diagnosis not present

## 2020-09-19 DIAGNOSIS — F0390 Unspecified dementia without behavioral disturbance: Secondary | ICD-10-CM | POA: Diagnosis not present

## 2020-09-22 DIAGNOSIS — I69354 Hemiplegia and hemiparesis following cerebral infarction affecting left non-dominant side: Secondary | ICD-10-CM | POA: Diagnosis not present

## 2020-09-22 DIAGNOSIS — E559 Vitamin D deficiency, unspecified: Secondary | ICD-10-CM | POA: Diagnosis not present

## 2020-09-22 DIAGNOSIS — I129 Hypertensive chronic kidney disease with stage 1 through stage 4 chronic kidney disease, or unspecified chronic kidney disease: Secondary | ICD-10-CM | POA: Diagnosis not present

## 2020-09-22 DIAGNOSIS — E1122 Type 2 diabetes mellitus with diabetic chronic kidney disease: Secondary | ICD-10-CM | POA: Diagnosis not present

## 2020-09-22 DIAGNOSIS — E876 Hypokalemia: Secondary | ICD-10-CM | POA: Diagnosis not present

## 2020-09-22 DIAGNOSIS — F0390 Unspecified dementia without behavioral disturbance: Secondary | ICD-10-CM | POA: Diagnosis not present

## 2020-09-22 DIAGNOSIS — N183 Chronic kidney disease, stage 3 unspecified: Secondary | ICD-10-CM | POA: Diagnosis not present

## 2020-09-22 DIAGNOSIS — E785 Hyperlipidemia, unspecified: Secondary | ICD-10-CM | POA: Diagnosis not present

## 2020-09-22 DIAGNOSIS — I951 Orthostatic hypotension: Secondary | ICD-10-CM | POA: Diagnosis not present

## 2020-09-25 ENCOUNTER — Other Ambulatory Visit: Payer: Self-pay | Admitting: *Deleted

## 2020-09-25 MED ORDER — SENNOSIDES-DOCUSATE SODIUM 8.6-50 MG PO TABS: ORAL_TABLET | ORAL | 3 refills | Status: AC

## 2020-09-26 ENCOUNTER — Telehealth: Payer: Self-pay

## 2020-09-26 DIAGNOSIS — F0390 Unspecified dementia without behavioral disturbance: Secondary | ICD-10-CM | POA: Diagnosis not present

## 2020-09-26 DIAGNOSIS — E785 Hyperlipidemia, unspecified: Secondary | ICD-10-CM | POA: Diagnosis not present

## 2020-09-26 DIAGNOSIS — I69354 Hemiplegia and hemiparesis following cerebral infarction affecting left non-dominant side: Secondary | ICD-10-CM | POA: Diagnosis not present

## 2020-09-26 DIAGNOSIS — E876 Hypokalemia: Secondary | ICD-10-CM | POA: Diagnosis not present

## 2020-09-26 DIAGNOSIS — I129 Hypertensive chronic kidney disease with stage 1 through stage 4 chronic kidney disease, or unspecified chronic kidney disease: Secondary | ICD-10-CM | POA: Diagnosis not present

## 2020-09-26 DIAGNOSIS — E1122 Type 2 diabetes mellitus with diabetic chronic kidney disease: Secondary | ICD-10-CM | POA: Diagnosis not present

## 2020-09-26 DIAGNOSIS — I951 Orthostatic hypotension: Secondary | ICD-10-CM | POA: Diagnosis not present

## 2020-09-26 DIAGNOSIS — N183 Chronic kidney disease, stage 3 unspecified: Secondary | ICD-10-CM | POA: Diagnosis not present

## 2020-09-26 DIAGNOSIS — E559 Vitamin D deficiency, unspecified: Secondary | ICD-10-CM | POA: Diagnosis not present

## 2020-09-26 NOTE — Telephone Encounter (Signed)
[facility] needing verbal orders to increase PT to 2x per week for 3 weeks.   Orders given.

## 2020-09-29 DIAGNOSIS — E785 Hyperlipidemia, unspecified: Secondary | ICD-10-CM | POA: Diagnosis not present

## 2020-09-29 DIAGNOSIS — N183 Chronic kidney disease, stage 3 unspecified: Secondary | ICD-10-CM | POA: Diagnosis not present

## 2020-09-29 DIAGNOSIS — E876 Hypokalemia: Secondary | ICD-10-CM | POA: Diagnosis not present

## 2020-09-29 DIAGNOSIS — I951 Orthostatic hypotension: Secondary | ICD-10-CM | POA: Diagnosis not present

## 2020-09-29 DIAGNOSIS — E559 Vitamin D deficiency, unspecified: Secondary | ICD-10-CM | POA: Diagnosis not present

## 2020-09-29 DIAGNOSIS — E1122 Type 2 diabetes mellitus with diabetic chronic kidney disease: Secondary | ICD-10-CM | POA: Diagnosis not present

## 2020-09-29 DIAGNOSIS — I129 Hypertensive chronic kidney disease with stage 1 through stage 4 chronic kidney disease, or unspecified chronic kidney disease: Secondary | ICD-10-CM | POA: Diagnosis not present

## 2020-09-29 DIAGNOSIS — F0390 Unspecified dementia without behavioral disturbance: Secondary | ICD-10-CM | POA: Diagnosis not present

## 2020-09-29 DIAGNOSIS — I69354 Hemiplegia and hemiparesis following cerebral infarction affecting left non-dominant side: Secondary | ICD-10-CM | POA: Diagnosis not present

## 2020-09-30 ENCOUNTER — Ambulatory Visit: Payer: Medicare HMO | Admitting: Neurology

## 2020-09-30 DIAGNOSIS — I69354 Hemiplegia and hemiparesis following cerebral infarction affecting left non-dominant side: Secondary | ICD-10-CM | POA: Diagnosis not present

## 2020-09-30 DIAGNOSIS — E876 Hypokalemia: Secondary | ICD-10-CM | POA: Diagnosis not present

## 2020-09-30 DIAGNOSIS — I951 Orthostatic hypotension: Secondary | ICD-10-CM | POA: Diagnosis not present

## 2020-09-30 DIAGNOSIS — I129 Hypertensive chronic kidney disease with stage 1 through stage 4 chronic kidney disease, or unspecified chronic kidney disease: Secondary | ICD-10-CM | POA: Diagnosis not present

## 2020-09-30 DIAGNOSIS — E1122 Type 2 diabetes mellitus with diabetic chronic kidney disease: Secondary | ICD-10-CM | POA: Diagnosis not present

## 2020-09-30 DIAGNOSIS — E785 Hyperlipidemia, unspecified: Secondary | ICD-10-CM | POA: Diagnosis not present

## 2020-09-30 DIAGNOSIS — E559 Vitamin D deficiency, unspecified: Secondary | ICD-10-CM | POA: Diagnosis not present

## 2020-09-30 DIAGNOSIS — F0390 Unspecified dementia without behavioral disturbance: Secondary | ICD-10-CM | POA: Diagnosis not present

## 2020-09-30 DIAGNOSIS — N183 Chronic kidney disease, stage 3 unspecified: Secondary | ICD-10-CM | POA: Diagnosis not present

## 2020-10-02 DIAGNOSIS — I69354 Hemiplegia and hemiparesis following cerebral infarction affecting left non-dominant side: Secondary | ICD-10-CM | POA: Diagnosis not present

## 2020-10-02 DIAGNOSIS — E1122 Type 2 diabetes mellitus with diabetic chronic kidney disease: Secondary | ICD-10-CM | POA: Diagnosis not present

## 2020-10-02 DIAGNOSIS — F0390 Unspecified dementia without behavioral disturbance: Secondary | ICD-10-CM | POA: Diagnosis not present

## 2020-10-02 DIAGNOSIS — E876 Hypokalemia: Secondary | ICD-10-CM | POA: Diagnosis not present

## 2020-10-02 DIAGNOSIS — I951 Orthostatic hypotension: Secondary | ICD-10-CM | POA: Diagnosis not present

## 2020-10-02 DIAGNOSIS — N183 Chronic kidney disease, stage 3 unspecified: Secondary | ICD-10-CM | POA: Diagnosis not present

## 2020-10-02 DIAGNOSIS — E785 Hyperlipidemia, unspecified: Secondary | ICD-10-CM | POA: Diagnosis not present

## 2020-10-02 DIAGNOSIS — I129 Hypertensive chronic kidney disease with stage 1 through stage 4 chronic kidney disease, or unspecified chronic kidney disease: Secondary | ICD-10-CM | POA: Diagnosis not present

## 2020-10-02 DIAGNOSIS — E559 Vitamin D deficiency, unspecified: Secondary | ICD-10-CM | POA: Diagnosis not present

## 2020-10-06 DIAGNOSIS — I69354 Hemiplegia and hemiparesis following cerebral infarction affecting left non-dominant side: Secondary | ICD-10-CM | POA: Diagnosis not present

## 2020-10-06 DIAGNOSIS — E1122 Type 2 diabetes mellitus with diabetic chronic kidney disease: Secondary | ICD-10-CM | POA: Diagnosis not present

## 2020-10-06 DIAGNOSIS — F0390 Unspecified dementia without behavioral disturbance: Secondary | ICD-10-CM | POA: Diagnosis not present

## 2020-10-06 DIAGNOSIS — E876 Hypokalemia: Secondary | ICD-10-CM | POA: Diagnosis not present

## 2020-10-06 DIAGNOSIS — I951 Orthostatic hypotension: Secondary | ICD-10-CM | POA: Diagnosis not present

## 2020-10-06 DIAGNOSIS — I129 Hypertensive chronic kidney disease with stage 1 through stage 4 chronic kidney disease, or unspecified chronic kidney disease: Secondary | ICD-10-CM | POA: Diagnosis not present

## 2020-10-06 DIAGNOSIS — E785 Hyperlipidemia, unspecified: Secondary | ICD-10-CM | POA: Diagnosis not present

## 2020-10-06 DIAGNOSIS — E559 Vitamin D deficiency, unspecified: Secondary | ICD-10-CM | POA: Diagnosis not present

## 2020-10-06 DIAGNOSIS — N183 Chronic kidney disease, stage 3 unspecified: Secondary | ICD-10-CM | POA: Diagnosis not present

## 2020-10-07 DIAGNOSIS — E1122 Type 2 diabetes mellitus with diabetic chronic kidney disease: Secondary | ICD-10-CM | POA: Diagnosis not present

## 2020-10-07 DIAGNOSIS — F0390 Unspecified dementia without behavioral disturbance: Secondary | ICD-10-CM | POA: Diagnosis not present

## 2020-10-07 DIAGNOSIS — I69354 Hemiplegia and hemiparesis following cerebral infarction affecting left non-dominant side: Secondary | ICD-10-CM | POA: Diagnosis not present

## 2020-10-07 DIAGNOSIS — E876 Hypokalemia: Secondary | ICD-10-CM | POA: Diagnosis not present

## 2020-10-07 DIAGNOSIS — E559 Vitamin D deficiency, unspecified: Secondary | ICD-10-CM | POA: Diagnosis not present

## 2020-10-07 DIAGNOSIS — I129 Hypertensive chronic kidney disease with stage 1 through stage 4 chronic kidney disease, or unspecified chronic kidney disease: Secondary | ICD-10-CM | POA: Diagnosis not present

## 2020-10-07 DIAGNOSIS — E785 Hyperlipidemia, unspecified: Secondary | ICD-10-CM | POA: Diagnosis not present

## 2020-10-07 DIAGNOSIS — N183 Chronic kidney disease, stage 3 unspecified: Secondary | ICD-10-CM | POA: Diagnosis not present

## 2020-10-07 DIAGNOSIS — I951 Orthostatic hypotension: Secondary | ICD-10-CM | POA: Diagnosis not present

## 2020-10-09 DIAGNOSIS — F0390 Unspecified dementia without behavioral disturbance: Secondary | ICD-10-CM | POA: Diagnosis not present

## 2020-10-09 DIAGNOSIS — I129 Hypertensive chronic kidney disease with stage 1 through stage 4 chronic kidney disease, or unspecified chronic kidney disease: Secondary | ICD-10-CM | POA: Diagnosis not present

## 2020-10-09 DIAGNOSIS — E876 Hypokalemia: Secondary | ICD-10-CM | POA: Diagnosis not present

## 2020-10-09 DIAGNOSIS — E785 Hyperlipidemia, unspecified: Secondary | ICD-10-CM | POA: Diagnosis not present

## 2020-10-09 DIAGNOSIS — I69354 Hemiplegia and hemiparesis following cerebral infarction affecting left non-dominant side: Secondary | ICD-10-CM | POA: Diagnosis not present

## 2020-10-09 DIAGNOSIS — E1122 Type 2 diabetes mellitus with diabetic chronic kidney disease: Secondary | ICD-10-CM | POA: Diagnosis not present

## 2020-10-09 DIAGNOSIS — I951 Orthostatic hypotension: Secondary | ICD-10-CM | POA: Diagnosis not present

## 2020-10-09 DIAGNOSIS — E559 Vitamin D deficiency, unspecified: Secondary | ICD-10-CM | POA: Diagnosis not present

## 2020-10-09 DIAGNOSIS — N183 Chronic kidney disease, stage 3 unspecified: Secondary | ICD-10-CM | POA: Diagnosis not present

## 2020-10-13 ENCOUNTER — Telehealth: Payer: Self-pay | Admitting: *Deleted

## 2020-10-13 NOTE — Telephone Encounter (Signed)
Faxed form to [facility] to discontinue Hydralizine for patient's blood pressure, per Dr Melford Aase.

## 2020-10-14 DIAGNOSIS — I951 Orthostatic hypotension: Secondary | ICD-10-CM | POA: Diagnosis not present

## 2020-10-14 DIAGNOSIS — E1122 Type 2 diabetes mellitus with diabetic chronic kidney disease: Secondary | ICD-10-CM | POA: Diagnosis not present

## 2020-10-14 DIAGNOSIS — I129 Hypertensive chronic kidney disease with stage 1 through stage 4 chronic kidney disease, or unspecified chronic kidney disease: Secondary | ICD-10-CM | POA: Diagnosis not present

## 2020-10-14 DIAGNOSIS — I69354 Hemiplegia and hemiparesis following cerebral infarction affecting left non-dominant side: Secondary | ICD-10-CM | POA: Diagnosis not present

## 2020-10-14 DIAGNOSIS — E876 Hypokalemia: Secondary | ICD-10-CM | POA: Diagnosis not present

## 2020-10-14 DIAGNOSIS — N183 Chronic kidney disease, stage 3 unspecified: Secondary | ICD-10-CM | POA: Diagnosis not present

## 2020-10-14 DIAGNOSIS — E785 Hyperlipidemia, unspecified: Secondary | ICD-10-CM | POA: Diagnosis not present

## 2020-10-14 DIAGNOSIS — F0390 Unspecified dementia without behavioral disturbance: Secondary | ICD-10-CM | POA: Diagnosis not present

## 2020-10-14 DIAGNOSIS — E559 Vitamin D deficiency, unspecified: Secondary | ICD-10-CM | POA: Diagnosis not present

## 2020-10-15 DIAGNOSIS — I129 Hypertensive chronic kidney disease with stage 1 through stage 4 chronic kidney disease, or unspecified chronic kidney disease: Secondary | ICD-10-CM | POA: Diagnosis not present

## 2020-10-15 DIAGNOSIS — E785 Hyperlipidemia, unspecified: Secondary | ICD-10-CM | POA: Diagnosis not present

## 2020-10-15 DIAGNOSIS — F0390 Unspecified dementia without behavioral disturbance: Secondary | ICD-10-CM | POA: Diagnosis not present

## 2020-10-15 DIAGNOSIS — E876 Hypokalemia: Secondary | ICD-10-CM | POA: Diagnosis not present

## 2020-10-15 DIAGNOSIS — I951 Orthostatic hypotension: Secondary | ICD-10-CM | POA: Diagnosis not present

## 2020-10-15 DIAGNOSIS — E559 Vitamin D deficiency, unspecified: Secondary | ICD-10-CM | POA: Diagnosis not present

## 2020-10-15 DIAGNOSIS — E1122 Type 2 diabetes mellitus with diabetic chronic kidney disease: Secondary | ICD-10-CM | POA: Diagnosis not present

## 2020-10-15 DIAGNOSIS — N183 Chronic kidney disease, stage 3 unspecified: Secondary | ICD-10-CM | POA: Diagnosis not present

## 2020-10-15 DIAGNOSIS — I69354 Hemiplegia and hemiparesis following cerebral infarction affecting left non-dominant side: Secondary | ICD-10-CM | POA: Diagnosis not present

## 2020-10-16 DIAGNOSIS — E876 Hypokalemia: Secondary | ICD-10-CM | POA: Diagnosis not present

## 2020-10-16 DIAGNOSIS — E785 Hyperlipidemia, unspecified: Secondary | ICD-10-CM | POA: Diagnosis not present

## 2020-10-16 DIAGNOSIS — N183 Chronic kidney disease, stage 3 unspecified: Secondary | ICD-10-CM | POA: Diagnosis not present

## 2020-10-16 DIAGNOSIS — F0390 Unspecified dementia without behavioral disturbance: Secondary | ICD-10-CM | POA: Diagnosis not present

## 2020-10-16 DIAGNOSIS — E559 Vitamin D deficiency, unspecified: Secondary | ICD-10-CM | POA: Diagnosis not present

## 2020-10-16 DIAGNOSIS — I951 Orthostatic hypotension: Secondary | ICD-10-CM | POA: Diagnosis not present

## 2020-10-16 DIAGNOSIS — I129 Hypertensive chronic kidney disease with stage 1 through stage 4 chronic kidney disease, or unspecified chronic kidney disease: Secondary | ICD-10-CM | POA: Diagnosis not present

## 2020-10-16 DIAGNOSIS — I69354 Hemiplegia and hemiparesis following cerebral infarction affecting left non-dominant side: Secondary | ICD-10-CM | POA: Diagnosis not present

## 2020-10-16 DIAGNOSIS — E1122 Type 2 diabetes mellitus with diabetic chronic kidney disease: Secondary | ICD-10-CM | POA: Diagnosis not present

## 2020-10-19 DIAGNOSIS — E876 Hypokalemia: Secondary | ICD-10-CM | POA: Diagnosis not present

## 2020-10-19 DIAGNOSIS — N183 Chronic kidney disease, stage 3 unspecified: Secondary | ICD-10-CM | POA: Diagnosis not present

## 2020-10-19 DIAGNOSIS — E1122 Type 2 diabetes mellitus with diabetic chronic kidney disease: Secondary | ICD-10-CM | POA: Diagnosis not present

## 2020-10-19 DIAGNOSIS — F0390 Unspecified dementia without behavioral disturbance: Secondary | ICD-10-CM | POA: Diagnosis not present

## 2020-10-19 DIAGNOSIS — I69354 Hemiplegia and hemiparesis following cerebral infarction affecting left non-dominant side: Secondary | ICD-10-CM | POA: Diagnosis not present

## 2020-10-19 DIAGNOSIS — I129 Hypertensive chronic kidney disease with stage 1 through stage 4 chronic kidney disease, or unspecified chronic kidney disease: Secondary | ICD-10-CM | POA: Diagnosis not present

## 2020-10-19 DIAGNOSIS — E559 Vitamin D deficiency, unspecified: Secondary | ICD-10-CM | POA: Diagnosis not present

## 2020-10-19 DIAGNOSIS — I951 Orthostatic hypotension: Secondary | ICD-10-CM | POA: Diagnosis not present

## 2020-10-19 DIAGNOSIS — E785 Hyperlipidemia, unspecified: Secondary | ICD-10-CM | POA: Diagnosis not present

## 2020-10-21 DIAGNOSIS — I69354 Hemiplegia and hemiparesis following cerebral infarction affecting left non-dominant side: Secondary | ICD-10-CM | POA: Diagnosis not present

## 2020-10-21 DIAGNOSIS — I951 Orthostatic hypotension: Secondary | ICD-10-CM | POA: Diagnosis not present

## 2020-10-21 DIAGNOSIS — E559 Vitamin D deficiency, unspecified: Secondary | ICD-10-CM | POA: Diagnosis not present

## 2020-10-21 DIAGNOSIS — E785 Hyperlipidemia, unspecified: Secondary | ICD-10-CM | POA: Diagnosis not present

## 2020-10-21 DIAGNOSIS — I129 Hypertensive chronic kidney disease with stage 1 through stage 4 chronic kidney disease, or unspecified chronic kidney disease: Secondary | ICD-10-CM | POA: Diagnosis not present

## 2020-10-21 DIAGNOSIS — N183 Chronic kidney disease, stage 3 unspecified: Secondary | ICD-10-CM | POA: Diagnosis not present

## 2020-10-21 DIAGNOSIS — E876 Hypokalemia: Secondary | ICD-10-CM | POA: Diagnosis not present

## 2020-10-21 DIAGNOSIS — E1122 Type 2 diabetes mellitus with diabetic chronic kidney disease: Secondary | ICD-10-CM | POA: Diagnosis not present

## 2020-10-21 DIAGNOSIS — F0390 Unspecified dementia without behavioral disturbance: Secondary | ICD-10-CM | POA: Diagnosis not present

## 2020-10-22 ENCOUNTER — Ambulatory Visit (INDEPENDENT_AMBULATORY_CARE_PROVIDER_SITE_OTHER): Payer: Medicare HMO | Admitting: Internal Medicine

## 2020-10-22 ENCOUNTER — Other Ambulatory Visit: Payer: Self-pay

## 2020-10-22 VITALS — BP 108/80 | HR 67 | Temp 97.3°F | Resp 16

## 2020-10-22 DIAGNOSIS — F0150 Vascular dementia without behavioral disturbance: Secondary | ICD-10-CM

## 2020-10-22 DIAGNOSIS — R0989 Other specified symptoms and signs involving the circulatory and respiratory systems: Secondary | ICD-10-CM

## 2020-10-22 DIAGNOSIS — B379 Candidiasis, unspecified: Secondary | ICD-10-CM

## 2020-10-22 DIAGNOSIS — Z79899 Other long term (current) drug therapy: Secondary | ICD-10-CM | POA: Diagnosis not present

## 2020-10-22 DIAGNOSIS — F0390 Unspecified dementia without behavioral disturbance: Secondary | ICD-10-CM | POA: Diagnosis not present

## 2020-10-22 DIAGNOSIS — E559 Vitamin D deficiency, unspecified: Secondary | ICD-10-CM | POA: Diagnosis not present

## 2020-10-22 DIAGNOSIS — E876 Hypokalemia: Secondary | ICD-10-CM | POA: Diagnosis not present

## 2020-10-22 DIAGNOSIS — N183 Chronic kidney disease, stage 3 unspecified: Secondary | ICD-10-CM | POA: Diagnosis not present

## 2020-10-22 DIAGNOSIS — I951 Orthostatic hypotension: Secondary | ICD-10-CM | POA: Diagnosis not present

## 2020-10-22 DIAGNOSIS — E785 Hyperlipidemia, unspecified: Secondary | ICD-10-CM | POA: Diagnosis not present

## 2020-10-22 DIAGNOSIS — I69354 Hemiplegia and hemiparesis following cerebral infarction affecting left non-dominant side: Secondary | ICD-10-CM | POA: Diagnosis not present

## 2020-10-22 DIAGNOSIS — I129 Hypertensive chronic kidney disease with stage 1 through stage 4 chronic kidney disease, or unspecified chronic kidney disease: Secondary | ICD-10-CM | POA: Diagnosis not present

## 2020-10-22 DIAGNOSIS — E1122 Type 2 diabetes mellitus with diabetic chronic kidney disease: Secondary | ICD-10-CM | POA: Diagnosis not present

## 2020-10-22 LAB — CBC WITH DIFFERENTIAL/PLATELET
Absolute Monocytes: 598 cells/uL (ref 200–950)
Basophils Absolute: 37 cells/uL (ref 0–200)
Basophils Relative: 0.4 %
Eosinophils Absolute: 74 cells/uL (ref 15–500)
Eosinophils Relative: 0.8 %
HCT: 38.2 % — ABNORMAL LOW (ref 38.5–50.0)
Hemoglobin: 12.8 g/dL — ABNORMAL LOW (ref 13.2–17.1)
Lymphs Abs: 745 cells/uL — ABNORMAL LOW (ref 850–3900)
MCH: 30 pg (ref 27.0–33.0)
MCHC: 33.5 g/dL (ref 32.0–36.0)
MCV: 89.7 fL (ref 80.0–100.0)
MPV: 10.2 fL (ref 7.5–12.5)
Monocytes Relative: 6.5 %
Neutro Abs: 7746 cells/uL (ref 1500–7800)
Neutrophils Relative %: 84.2 %
Platelets: 177 10*3/uL (ref 140–400)
RBC: 4.26 10*6/uL (ref 4.20–5.80)
RDW: 14 % (ref 11.0–15.0)
Total Lymphocyte: 8.1 %
WBC: 9.2 10*3/uL (ref 3.8–10.8)

## 2020-10-22 LAB — COMPLETE METABOLIC PANEL WITH GFR
AG Ratio: 2 (calc) (ref 1.0–2.5)
ALT: 13 U/L (ref 9–46)
AST: 14 U/L (ref 10–35)
Albumin: 3.8 g/dL (ref 3.6–5.1)
Alkaline phosphatase (APISO): 85 U/L (ref 35–144)
BUN/Creatinine Ratio: 12 (calc) (ref 6–22)
BUN: 19 mg/dL (ref 7–25)
CO2: 31 mmol/L (ref 20–32)
Calcium: 9.4 mg/dL (ref 8.6–10.3)
Chloride: 106 mmol/L (ref 98–110)
Creat: 1.55 mg/dL — ABNORMAL HIGH (ref 0.70–1.18)
GFR, Est African American: 50 mL/min/{1.73_m2} — ABNORMAL LOW (ref >=60)
GFR, Est Non African American: 43 mL/min/{1.73_m2} — ABNORMAL LOW (ref >=60)
Globulin: 1.9 g/dL (calc) (ref 1.9–3.7)
Glucose, Bld: 95 mg/dL (ref 65–99)
Potassium: 4 mmol/L (ref 3.5–5.3)
Sodium: 144 mmol/L (ref 135–146)
Total Bilirubin: 0.7 mg/dL (ref 0.2–1.2)
Total Protein: 5.7 g/dL — ABNORMAL LOW (ref 6.1–8.1)

## 2020-10-22 NOTE — Progress Notes (Addendum)
History of Present Illness:     This nice 75 yo [name] with  HTN, HLD, Vit D Deficiency,Vit B12 Deficiency & Vascular Dementia  returns for f/u of unstable BP's with Dysautonomia. Patient has long hx/o severe labile sitting/laying BP's complicated by profound postural hypotension.      Patient was hospitalized 9/28 - 07/18/20 with an ischemic frontal lobe CVA and was discharged to [facility] for LPT for 26 days thru 08/13/20. During hospitalization/SNF stay his BP's maintained hypertensive and his Florinef & Midodrine was stopped and Hydralazine was added to his regimen.      Since home , patient has been receiving LPT. His caretaker wife monitors postural BP's at least 5-6 x /day and as his postural hypotension has returned, she had restarted his Florinef. Despite this, patient continues with very labile wide high & low swings in his BP and she is somewhat frustrated that his BP doesn't stabilize.      Due to patients severe profound postural hypotension, he is non-ambulatory requiring a wheelchair for transport. It is advised that he procure a semi-electric hospital bed with half rails to avoid falling out of bed.      Patient has deteriorated to the point of being essentially bed bound due to  his worsening Dementia.      Patient is unable to position himself in a regular bed due to physical deconditioning. Patient requires re-positioning of his body in ways that cannot be achieved with an ordinary bed or wedge pillow to prevent and to eliminate pain, reduce pressure and prevent skin breakdown and development of pressure sores.      Patient needs assistance & supervision to change positions in bed due to his weakness & Dementia. He also requires the ability to elevate the head of his bed to more than 30 degrees for difficulties with swallowing and concern for aspiration due to his deteriorating neurologic status.    Medications Current Outpatient Medications (Cardiovascular):   .   amLODipine  5 MG,                 Take 1  tablet  Daily  .  hydrALAZINE 25 MG tablet,   Take  1 tablet  Daily   - Back on Florinef 0.1 mg,            Take 1 tab tid   .  rosuvastatin (CRESTOR) 5 MG tablet, Take 1 tablet Daily for Cholesterol (Patient taking differently: Take 5 mg by mouth daily. )  .  aspirin EC 81 MG tablet, Take 1 tablet daily  .  clopidogrel (PLAVIX) 75 MG tablet, Take 1 tablet daily.  .  vitamin B-12  500 MCG tab\, Dissolve 1 tablet under tongue daily   .  Magnesium 250 MG TABS, Take 250 mg \\daily .  .  mirabegron ER (MYRBETRIQ) 50 MG , Take 1 tablet Daily for Bladder  .  omeprazole  20 MG , Take 1 cap daily   .  polyethylene glycol 17 g packet, Take  daily. .  potassium chloride \\20  MEQ tablet, Take 1 tablet 2 x /day for Potassium  .  QUEtiapine (SEROQUEL) 50 MG , Take 1 to 2 tablets  late Afternoon for "Sundowning"  .  senna-docusate, Take 1 tablet Daily for Constipation  Problem list He has HTN (hypertension); Hyperlipidemia; History of prediabetes; Vitamin D deficiency; Goals of care, counseling/discussion; BMI 24.0-24.9, adult; Acid reflux; Memory changes; Imbalance; Unstable gait; Frequent falls; B12 deficiency; CVA (cerebral vascular  accident) [facility]); Hypokalemia; Left-sided weakness; Dementia without behavioral disturbance (HCC); and Palliative care by specialist on their problem list.   Observations/Objective:   BP 108/80  (by nurse)  P 67   T 97.3 F   R 16   SpO2 98%   ReCheck  Sitting    BP  97/61  P 66 RA -Wrist Cuff  / Recheck Wall cuff  - Sit BP  91/59    P  65  HEENT - WNL. Neck - supple.  Chest - Clear equal BS. Cor - Nl HS. RRR w/o sig MGR. PP 1(+). No edema. MS-  Severe generalized decrease in muscle  power, tone & bulk. In wheelchair. Neuro -  Unable to stand & walk independently. Coordination is poor.                 Mentally dulled with slow cognition and poor insight /judgement   Assessment and Plan:  1. Autonomic postural  hypotension  - Advised restart Midodrine  10 mg tid  - Ambulatory referral to Cardiology    2. Labile hypertension  - Ambulatory referral to Cardiology  3. Vascular dementia without behavioral disturbance (HCC)   4. Medication management  - CBC with Differential/Platelet - COMPLETE METABOLIC PANEL WITH GFR   Follow Up Instructions:       I discussed the assessment and treatment plan with the patient & wife. They  were provided an opportunity to ask questions and all were answered. They agreed with the plan and demonstrated an understanding of the instructions.       They were advised to call back or seek an in-person evaluation if the symptoms worsen or if the condition fails to improve as anticipated.   Marinus Maw, MD

## 2020-10-23 ENCOUNTER — Encounter: Payer: Self-pay | Admitting: Internal Medicine

## 2020-10-23 MED ORDER — FLUDROCORTISONE ACETATE 0.1 MG PO TABS: ORAL_TABLET | ORAL | 0 refills | Status: DC

## 2020-10-23 MED ORDER — MIDODRINE HCL 10 MG PO TABS: ORAL_TABLET | ORAL | 0 refills | Status: DC

## 2020-10-24 DIAGNOSIS — E559 Vitamin D deficiency, unspecified: Secondary | ICD-10-CM | POA: Diagnosis not present

## 2020-10-24 DIAGNOSIS — I951 Orthostatic hypotension: Secondary | ICD-10-CM | POA: Diagnosis not present

## 2020-10-24 DIAGNOSIS — I129 Hypertensive chronic kidney disease with stage 1 through stage 4 chronic kidney disease, or unspecified chronic kidney disease: Secondary | ICD-10-CM | POA: Diagnosis not present

## 2020-10-24 DIAGNOSIS — F0390 Unspecified dementia without behavioral disturbance: Secondary | ICD-10-CM | POA: Diagnosis not present

## 2020-10-24 DIAGNOSIS — E1122 Type 2 diabetes mellitus with diabetic chronic kidney disease: Secondary | ICD-10-CM | POA: Diagnosis not present

## 2020-10-24 DIAGNOSIS — I69354 Hemiplegia and hemiparesis following cerebral infarction affecting left non-dominant side: Secondary | ICD-10-CM | POA: Diagnosis not present

## 2020-10-24 DIAGNOSIS — E876 Hypokalemia: Secondary | ICD-10-CM | POA: Diagnosis not present

## 2020-10-24 DIAGNOSIS — E785 Hyperlipidemia, unspecified: Secondary | ICD-10-CM | POA: Diagnosis not present

## 2020-10-24 DIAGNOSIS — N183 Chronic kidney disease, stage 3 unspecified: Secondary | ICD-10-CM | POA: Diagnosis not present

## 2020-10-27 DIAGNOSIS — E1122 Type 2 diabetes mellitus with diabetic chronic kidney disease: Secondary | ICD-10-CM | POA: Diagnosis not present

## 2020-10-27 DIAGNOSIS — I129 Hypertensive chronic kidney disease with stage 1 through stage 4 chronic kidney disease, or unspecified chronic kidney disease: Secondary | ICD-10-CM | POA: Diagnosis not present

## 2020-10-27 DIAGNOSIS — E876 Hypokalemia: Secondary | ICD-10-CM | POA: Diagnosis not present

## 2020-10-27 DIAGNOSIS — I951 Orthostatic hypotension: Secondary | ICD-10-CM | POA: Diagnosis not present

## 2020-10-27 DIAGNOSIS — F0390 Unspecified dementia without behavioral disturbance: Secondary | ICD-10-CM | POA: Diagnosis not present

## 2020-10-27 DIAGNOSIS — I69354 Hemiplegia and hemiparesis following cerebral infarction affecting left non-dominant side: Secondary | ICD-10-CM | POA: Diagnosis not present

## 2020-10-27 DIAGNOSIS — E785 Hyperlipidemia, unspecified: Secondary | ICD-10-CM | POA: Diagnosis not present

## 2020-10-27 DIAGNOSIS — E559 Vitamin D deficiency, unspecified: Secondary | ICD-10-CM | POA: Diagnosis not present

## 2020-10-27 DIAGNOSIS — N183 Chronic kidney disease, stage 3 unspecified: Secondary | ICD-10-CM | POA: Diagnosis not present

## 2020-10-28 DIAGNOSIS — E876 Hypokalemia: Secondary | ICD-10-CM | POA: Diagnosis not present

## 2020-10-28 DIAGNOSIS — E785 Hyperlipidemia, unspecified: Secondary | ICD-10-CM | POA: Diagnosis not present

## 2020-10-28 DIAGNOSIS — E1122 Type 2 diabetes mellitus with diabetic chronic kidney disease: Secondary | ICD-10-CM | POA: Diagnosis not present

## 2020-10-28 DIAGNOSIS — N183 Chronic kidney disease, stage 3 unspecified: Secondary | ICD-10-CM | POA: Diagnosis not present

## 2020-10-28 DIAGNOSIS — I69354 Hemiplegia and hemiparesis following cerebral infarction affecting left non-dominant side: Secondary | ICD-10-CM | POA: Diagnosis not present

## 2020-10-28 DIAGNOSIS — E559 Vitamin D deficiency, unspecified: Secondary | ICD-10-CM | POA: Diagnosis not present

## 2020-10-28 DIAGNOSIS — I951 Orthostatic hypotension: Secondary | ICD-10-CM | POA: Diagnosis not present

## 2020-10-28 DIAGNOSIS — I129 Hypertensive chronic kidney disease with stage 1 through stage 4 chronic kidney disease, or unspecified chronic kidney disease: Secondary | ICD-10-CM | POA: Diagnosis not present

## 2020-10-28 DIAGNOSIS — F0390 Unspecified dementia without behavioral disturbance: Secondary | ICD-10-CM | POA: Diagnosis not present

## 2020-10-29 ENCOUNTER — Encounter: Payer: Self-pay | Admitting: Cardiology

## 2020-10-29 ENCOUNTER — Ambulatory Visit: Payer: Medicare HMO | Admitting: Cardiology

## 2020-10-29 ENCOUNTER — Other Ambulatory Visit: Payer: Self-pay

## 2020-10-29 VITALS — BP 120/76 | HR 78 | Ht 74.0 in | Wt 160.0 lb

## 2020-10-29 DIAGNOSIS — R9431 Abnormal electrocardiogram [ECG] [EKG]: Secondary | ICD-10-CM | POA: Diagnosis not present

## 2020-10-29 DIAGNOSIS — R55 Syncope and collapse: Secondary | ICD-10-CM | POA: Diagnosis not present

## 2020-10-29 DIAGNOSIS — I951 Orthostatic hypotension: Secondary | ICD-10-CM | POA: Diagnosis not present

## 2020-10-29 DIAGNOSIS — I10 Essential (primary) hypertension: Secondary | ICD-10-CM | POA: Diagnosis not present

## 2020-10-29 MED ORDER — LOSARTAN POTASSIUM 25 MG PO TABS: 25.0000 mg | ORAL_TABLET | Freq: Every day | ORAL | 3 refills | Status: DC

## 2020-10-29 MED ORDER — PYRIDOSTIGMINE BROMIDE 60 MG PO TABS: 30.0000 mg | ORAL_TABLET | Freq: Two times a day (BID) | ORAL | 3 refills | Status: DC

## 2020-10-29 NOTE — Progress Notes (Signed)
Cardiology Consult Note   Date:  10/29/2020   ID:  Mathew Phi., DOB Oct 11, 1946, MRN [id number]  PCP:  Unk Pinto, MD  Cardiologist:  Fransico Him, MD   Chief Complaint  Patient presents with  . New Patient (Initial Visit)    Orthostatic hypotension and HTN    History of Present Illness:  Mathew Zeidman. is a 75 y.o. male who is being seen today for the evaluation of labile HTN at the request of Unk Pinto, MD.  This is a 75yo male with a hx of HTN, HLD, Vit D def and vascular dementia.  He has a hx of ischemic frontal lobe CVA in Sept 2021 and was in SNF for a month.  He has has issues with HTN as well as orthostatic hypotension.  He was on midodrine and Florinef which had to be stopped due to HTN and Hydralazine was added to help with his elevated BP. Recently he has had more problems with postural hypotension and his Florinef was restarted and continues to have wide swings in his BP.  He is now referred to Cardiology for further evaluation.  Currently he is on amlodipine 5mg  daily and Hydralazine 25mg  daily as well as Florinef 0.1mg  daily.    He is here today for followup and is doing well.  He denies any chest pain or pressure, SOB, DOE, PND, orthopnea, LE edema, dizziness, palpitations or syncope. he is compliant with his meds and is tolerating meds with no SE.    Past Medical History:  Diagnosis Date  . Constipation   . Hyperlipidemia   . Hypertension   . Prediabetes    no meds   . Vitamin D deficiency     Past Surgical History:  Procedure Laterality Date  . CATARACT EXTRACTION, BILATERAL  2017  . COLONOSCOPY    . NO PAST SURGERIES      Current Medications: Current Meds  Medication Sig  . amLODipine (NORVASC) 5 MG tablet Take      1 tablet      Daily       for BP  . aspirin EC 81 MG tablet Take 1 tablet (81 mg total) by mouth daily. Swallow whole.  . fludrocortisone (FLORINEF) 0.1 MG tablet Take      1 to 2 tablets      3 x/day       to  maintain Standing BP over 100  . hydrALAZINE (APRESOLINE) 25 MG tablet Take      1 tablet       Daily       for BP  . Magnesium 250 MG TABS Take 250 mg by mouth daily.  . midodrine (PROAMATINE) 10 MG tablet Take    1 tablet    3 x /day    for Low BP  . mirabegron ER (MYRBETRIQ) 50 MG TB24 tablet Take 1 tablet Daily for Bladder (Patient taking differently: Take 50 mg by mouth daily.)  . omeprazole (PRILOSEC) 20 MG capsule Take 1 capsule   2 x   /day   to Prevent Heartburn & Indigestion (Patient taking differently: Take 20 mg by mouth daily.)  . polyethylene glycol (MIRALAX / GLYCOLAX) 17 g packet Take 17 g by mouth daily.  . potassium chloride SA (KLOR-CON) 20 MEQ tablet Take 1 tablet 2 x /day for Potassium (Patient taking differently: Take 20 mEq by mouth 2 (two) times daily.)  . QUEtiapine (SEROQUEL) 50 MG tablet Take 1 to 2 tablets in  late Afternoon for "Sundowning"  . rosuvastatin (CRESTOR) 5 MG tablet Take 1 tablet Daily for Cholesterol (Patient taking differently: Take 5 mg by mouth daily.)  . senna-docusate (GNP STOOL SOFTENER/LAXATIVE) 8.6-50 MG tablet Take 1 tablet Daily for Constipation  . vitamin B-12 (CYANOCOBALAMIN) 500 MCG tablet Dissolve 1 tablet under tongue daily (Patient taking differently: Take 500 mcg by mouth daily. Dissolve 1 tablet under tongue daily)    Allergies:   Hydrocodone-acetaminophen, Sudafed [pseudoephedrine hcl], and Tetanus toxoids   Social History   Socioeconomic History  . Marital status: Married    Spouse name: Not on file  . Number of children: Not on file  . Years of education: Not on file  . Highest education level: Not on file  Occupational History  . Occupation: retired   Tobacco Use  . Smoking status: Never Smoker  . Smokeless tobacco: Never Used  Vaping Use  . Vaping Use: Never used  Substance and Sexual Activity  . Alcohol use: Yes    Alcohol/week: 0.0 standard drinks    Comment: very Rare /Beer.wine.  . Drug use: No  . Sexual activity:  Not on file  Other Topics Concern  . Not on file  Social History Narrative  . Not on file   Social Determinants of Health   Financial Resource Strain: Not on file  Food Insecurity: Not on file  Transportation Needs: Not on file  Physical Activity: Not on file  Stress: Not on file  Social Connections: Not on file     Family History:  The patient's family history includes Glaucoma in his mother; Heart disease in his father; Hyperlipidemia in his mother; Hypertension in his father.   ROS:   Please see the history of present illness.    ROS All other systems reviewed and are negative.  No flowsheet data found.     PHYSICAL EXAM:   VS:  BP 120/76   Pulse 78   Ht 6\' 2"  (1.88 m)   Wt 160 lb (72.6 kg) Comment: caregiver stated weight-patient couldnt stand on scale  SpO2 98%   BMI 20.54 kg/m    GEN: Well nourished, well developed, in no acute distress  HEENT: normal  Neck: no JVD, carotid bruits, or masses Cardiac: RRR; no murmurs, rubs, or gallops,no edema.  Intact distal pulses bilaterally.  Respiratory:  clear to auscultation bilaterally, normal work of breathing GI: soft, nontender, nondistended, + BS MS: no deformity or atrophy  Skin: warm and dry, no rash Neuro:  Alert and Oriented x 3, Strength and sensation are intact Psych: euthymic mood, full affect  Wt Readings from Last 3 Encounters:  10/29/20 160 lb (72.6 kg)  09/05/20 150 lb (68 kg)  07/26/20 165 lb (74.8 kg)      Studies/Labs Reviewed:   EKG:  EKG is ordered today.  The ekg ordered today demonstrates   2D echo 06/2020 IMPRESSIONS  1. Left ventricular ejection fraction, by estimation, is 60 to 65%. The  left ventricle has normal function. The left ventricle has no regional  wall motion abnormalities. There is mild left ventricular hypertrophy.  Left ventricular diastolic parameters  are consistent with Grade I diastolic dysfunction (impaired relaxation).  2. Right ventricular systolic function is  normal. The right ventricular  size is normal.  3. The mitral valve is normal in structure. Trivial mitral valve  regurgitation. No evidence of mitral stenosis.  4. The aortic valve is tricuspid. Aortic valve regurgitation is not  visualized. Mild to moderate aortic valve sclerosis/calcification  is  present, without any evidence of aortic stenosis.  5. The inferior vena cava is normal in size with greater than 50%  respiratory variability, suggesting right atrial pressure of 3 mmHg.   Comparison(s): No significant change from prior study. Prior images  reviewed side by side.   Recent Labs: 09/05/2020: Magnesium 2.1; TSH 4.02 10/22/2020: ALT 13; BUN 19; Creat 1.55; Hemoglobin 12.8; Platelets 177; Potassium 4.0; Sodium 144   Lipid Panel    Component Value Date/Time   CHOL 130 07/16/2020 0340   TRIG 65 07/16/2020 0340   HDL 48 07/16/2020 0340   CHOLHDL 2.7 07/16/2020 0340   VLDL 13 07/16/2020 0340   LDLCALC 69 07/16/2020 0340   LDLCALC 65 03/27/2020 1248   Additional studies/ records that were reviewed today include:  OV notes from PCP    ASSESSMENT:    1. Orthostatic hypotension   2. Primary hypertension   3. Syncope, unspecified syncope type   4. Prolonged Q-T interval on ECG      PLAN:  In order of problems listed above:  1.  Orthostatic hypotension -he is in a wheelchair and needs assistance with standing but orthostatic BPs drop from 141/20mmHg sitting to 71/29mmHg standing with HR increase from 72 to 80bpm.  -his wife brought in BP readings from home where BP will be as high as 186/63mmHg and drop with dizziness or syncope to 60/31mmhg and as low as 45/23mmhg when he passes out.  -he has had multiple syncopal episodes with sitting and standing -2D echo normal in Sept 2021 -he is on several drugs that will potentiate orthostasis including amlodipine (peripheral vasodilator), hydralazine (alpha blocker) and Seroquel.   -recommend stopping amlodipine, Hydralazine  and seroquel -avoid proamatine and florinef due to propensity to exacerbate supine HTN -start Mestinon 30mg  BID -change knee high to  thigh high compression hose and continue abdominal binder -followup in 2 weeks with me  for orthostatic BP check and further titration of Mestinon as needed -I am very concerned that he may had a neurodegenerative disorder that is presenting as severe dysautonomia.  He was dx about 1 year ago with dementia and his wife says that it has progressed rapidly. I will refer to Neurology for evaluation  2.  HTN -he has had wide fluctuations in his BP -he was placed on an alpha blocker and a dihydropyridine both of which can exacerbate orthostasis -recommend stopping amlodipine and hydralazine -his SCR is mildly elevated but will try low dose Losartan 25mg  daily -repeat BMET in 1 week  3.  Syncope -he has had multiple syncopal episodes when standing and sitting -he has passed out just sitting in the car seat -I suspect that this is all related to his orthostatic hypotension -he wore a heart monitor for 4 days in 2020 showing PACs and PVCs as well as a short run of SVT -I will get a 30 day event monitor to rule out bradyarrythmias -treat for dysautonomia as per #1  4.  Prolonged QT -his QTc is prolonged in Oct and likely related to Seroquel -I have instructed is wife to stop his seroquel   Medication Adjustments/Labs and Tests Ordered: Current medicines are reviewed at length with the patient today.  Concerns regarding medicines are outlined above.  Medication changes, Labs and Tests ordered today are listed in the Patient Instructions below.  There are no Patient Instructions on file for this visit.   Signed, Fransico Him, MD  10/29/2020 3:07 PM    [facility]  [address], [place], [place]  [address] Phone: [phone]; Fax: [phone]

## 2020-10-29 NOTE — Patient Instructions (Addendum)
Medication Instructions:  Your physician has recommended you make the following change in your medication:  STOP taking the following medications:  1) Norvasc (amlodipine)  2) Apresoline (hydralazine)  3) Florinef (fludrocortisone)   4) Proamatine (midodrine)  5) Seroquel (questiapine)  START taking the following medications   1) Mestinon 30 mg twice daily   2) Losartan 25 mg daily   *If you need a refill on your cardiac medications before your next appointment, please call your pharmacy*  Lab Work: BMET on January 20th anytime between 7:30am and 4:30pm  If you have labs (blood work) drawn today and your tests are completely normal, you will receive your results only by: Marland Kitchen MyChart Message (if you have MyChart) OR . A paper copy in the mail If you have any lab test that is abnormal or we need to change your treatment, we will call you to review the results.  Follow-Up: At [facility], you and your health needs are our priority.  As part of our continuing mission to provide you with exceptional heart care, we have created designated Provider Care Teams.  These Care Teams include your primary Cardiologist (physician) and Advanced Practice Providers (APPs -  Physician Assistants and Nurse Practitioners) who all work together to provide you with the care you need, when you need it.  Your next appointment:   Friday, February 4th at 3:00pm   The format for your next appointment:   In Person  Provider:   Fransico Him, MD  Other Instructions You have been referred to see a neurologist.

## 2020-10-29 NOTE — Addendum Note (Signed)
Addended by: Antonieta Iba on: 10/29/2020 03:22 PM   Modules accepted: Orders

## 2020-10-30 DIAGNOSIS — I129 Hypertensive chronic kidney disease with stage 1 through stage 4 chronic kidney disease, or unspecified chronic kidney disease: Secondary | ICD-10-CM | POA: Diagnosis not present

## 2020-10-30 DIAGNOSIS — E1122 Type 2 diabetes mellitus with diabetic chronic kidney disease: Secondary | ICD-10-CM | POA: Diagnosis not present

## 2020-10-30 DIAGNOSIS — N183 Chronic kidney disease, stage 3 unspecified: Secondary | ICD-10-CM | POA: Diagnosis not present

## 2020-10-30 DIAGNOSIS — E876 Hypokalemia: Secondary | ICD-10-CM | POA: Diagnosis not present

## 2020-10-30 DIAGNOSIS — I951 Orthostatic hypotension: Secondary | ICD-10-CM | POA: Diagnosis not present

## 2020-10-30 DIAGNOSIS — I69354 Hemiplegia and hemiparesis following cerebral infarction affecting left non-dominant side: Secondary | ICD-10-CM | POA: Diagnosis not present

## 2020-10-30 DIAGNOSIS — E785 Hyperlipidemia, unspecified: Secondary | ICD-10-CM | POA: Diagnosis not present

## 2020-10-30 DIAGNOSIS — F0390 Unspecified dementia without behavioral disturbance: Secondary | ICD-10-CM | POA: Diagnosis not present

## 2020-10-30 DIAGNOSIS — E559 Vitamin D deficiency, unspecified: Secondary | ICD-10-CM | POA: Diagnosis not present

## 2020-10-30 NOTE — Addendum Note (Signed)
Addended by: Antonieta Iba on: 10/30/2020 03:40 PM   Modules accepted: Orders

## 2020-11-03 ENCOUNTER — Other Ambulatory Visit: Payer: Medicare HMO

## 2020-11-04 ENCOUNTER — Telehealth: Payer: Self-pay | Admitting: Cardiology

## 2020-11-04 DIAGNOSIS — I951 Orthostatic hypotension: Secondary | ICD-10-CM | POA: Diagnosis not present

## 2020-11-04 DIAGNOSIS — E785 Hyperlipidemia, unspecified: Secondary | ICD-10-CM | POA: Diagnosis not present

## 2020-11-04 DIAGNOSIS — E1122 Type 2 diabetes mellitus with diabetic chronic kidney disease: Secondary | ICD-10-CM | POA: Diagnosis not present

## 2020-11-04 DIAGNOSIS — I129 Hypertensive chronic kidney disease with stage 1 through stage 4 chronic kidney disease, or unspecified chronic kidney disease: Secondary | ICD-10-CM | POA: Diagnosis not present

## 2020-11-04 DIAGNOSIS — N183 Chronic kidney disease, stage 3 unspecified: Secondary | ICD-10-CM | POA: Diagnosis not present

## 2020-11-04 DIAGNOSIS — E559 Vitamin D deficiency, unspecified: Secondary | ICD-10-CM | POA: Diagnosis not present

## 2020-11-04 DIAGNOSIS — I69354 Hemiplegia and hemiparesis following cerebral infarction affecting left non-dominant side: Secondary | ICD-10-CM | POA: Diagnosis not present

## 2020-11-04 DIAGNOSIS — E876 Hypokalemia: Secondary | ICD-10-CM | POA: Diagnosis not present

## 2020-11-04 DIAGNOSIS — F0390 Unspecified dementia without behavioral disturbance: Secondary | ICD-10-CM | POA: Diagnosis not present

## 2020-11-04 NOTE — Telephone Encounter (Signed)
Pt is returning call.  

## 2020-11-04 NOTE — Telephone Encounter (Signed)
Mestinon can cause fatigue, but he is on a very low dose. I will have to defer to Dr. Radford Pax.

## 2020-11-04 NOTE — Telephone Encounter (Signed)
Left message to call back  

## 2020-11-04 NOTE — Telephone Encounter (Signed)
Confirmed with PharmD that losartan would not cause extreme lethargy, but many medication changes at once can definitely cause mental changes.  Called to check on the patient. Confirmed with the patient's wife he is arousable, able to eat and drink, and walk, he just doesn't want to because he rather sleep. She says this is totally different from when he had a stroke a few months ago. She does mention his urine is very dark and asks about a urine test. Reiterated to her that a UTI can cause AMS and to contact Dr. Idell Pickles office for testing. Confirmed Neurology called her for appointment but it is not until March. Will request earlier appointment due to progressive symptoms. She will hold his losartan until she hears back from Dr. Radford Pax tomorrow.

## 2020-11-04 NOTE — Telephone Encounter (Signed)
The patient's wife is concerned because after seeing Dr. Radford Pax 1/12, Mr. Zenon has been extremely tired. She states he is arousable, but he sleeps, heavily, all the time. Prior to the medication changes, he was relatively independent. Now he is so tired she changes Depends in the bed and gives him bed baths. He is able to eat and swallow safely and is alert and oriented, but he just sleeps all the time.  She did not give him any medications today and his BP is 151/93. He is currently sleeping, but she states he has no other complaints.  Med changes as listed: STOP taking the following medications:  1) Norvasc (amlodipine)  2) Apresoline (hydralazine)  3) Florinef (fludrocortisone)   4) Proamatine (midodrine)  5) Seroquel (questiapine)  START taking the following medications   1) Mestinon 30 mg twice daily   2) Losartan 25 mg daily   She states the patient is eating and drinking normally, has no slurring, no swelling. She requests a call back with recommendations.

## 2020-11-04 NOTE — Telephone Encounter (Signed)
Needs to see PCP ASAP as UTI can call lethargy

## 2020-11-04 NOTE — Telephone Encounter (Signed)
° ° °  Pt c/o medication issue:  1. Name of Medication:   losartan (COZAAR) 25 MG tablet    2. How are you currently taking this medication (dosage and times per day)?   3. Are you having a reaction (difficulty breathing--STAT)?   4. What is your medication issue? Pt's wife said since pt took his meds pt is always asleep, she is concerned and would like to speak with a nurse

## 2020-11-05 ENCOUNTER — Other Ambulatory Visit: Payer: Medicare HMO

## 2020-11-05 ENCOUNTER — Other Ambulatory Visit: Payer: Self-pay | Admitting: Internal Medicine

## 2020-11-05 ENCOUNTER — Other Ambulatory Visit: Payer: Self-pay

## 2020-11-05 DIAGNOSIS — N3000 Acute cystitis without hematuria: Secondary | ICD-10-CM

## 2020-11-05 DIAGNOSIS — R300 Dysuria: Secondary | ICD-10-CM

## 2020-11-05 MED ORDER — SULFAMETHOXAZOLE-TRIMETHOPRIM 800-160 MG PO TABS: ORAL_TABLET | ORAL | 0 refills | Status: DC

## 2020-11-05 NOTE — Telephone Encounter (Signed)
Pt called in and stated that she did take pt to PCP and he did have a UTI.  She would like to speak with Hinton Dyer about pts med

## 2020-11-05 NOTE — Telephone Encounter (Signed)
We'll see what we can do and we can add him to wait list

## 2020-11-05 NOTE — Telephone Encounter (Signed)
Spoke with the patient's wife who reports that the patient does have a UTI and they started him on antibiotics. Patient is still very fatigued and sleeping all the time. I advised that it will take time for him to start feeling better and if he does not feel better soon to call his PCP.  Patient is scheduled for lab work tomorrow, but I have rescheduled until next week since the patient is not feeling well.

## 2020-11-05 NOTE — Telephone Encounter (Signed)
Reiterated to the patient's wife the importance of calling PCP ASAP for evaluation. Will send to Dr. Tomi Likens to see if he can get a sooner appointment as he is not scheduled until 01/13/21. The patient was grateful for call and agrees with plan.

## 2020-11-06 ENCOUNTER — Other Ambulatory Visit: Payer: Medicare HMO

## 2020-11-06 LAB — URINALYSIS, ROUTINE W REFLEX MICROSCOPIC
Bilirubin Urine: NEGATIVE
Glucose, UA: NEGATIVE
Hgb urine dipstick: NEGATIVE
Ketones, ur: NEGATIVE
Leukocytes,Ua: NEGATIVE
Nitrite: NEGATIVE
Protein, ur: NEGATIVE
Specific Gravity, Urine: 1.022 (ref 1.001–1.03)
pH: <=5 (ref 5.0–8.0)

## 2020-11-06 LAB — URINE CULTURE
MICRO NUMBER:: 11434605
SPECIMEN QUALITY:: ADEQUATE

## 2020-11-07 ENCOUNTER — Telehealth: Payer: Self-pay | Admitting: Cardiology

## 2020-11-07 NOTE — Progress Notes (Signed)
======================================= =======================================  -   Urine culture - OK - No Infection  ======================================= ======================================= 

## 2020-11-07 NOTE — Telephone Encounter (Signed)
Per Mychart message   I have reviewed [facility] lab and do not fully understand the numbers.  Could someone send me a message and explain

## 2020-11-07 NOTE — Telephone Encounter (Signed)
Spoke with the patient's wife and advised her that she needs to call the patient's PCP in regards to his lab work. She states that the patient is still extremely fatigued and is sleeping all day. He denies any chest pain or SOB. She states that his BP last night was 171/? And this morning it was 129/81. She states that the patient has started back taking losartan and mestinon. She is going to call his PCP in regards to lab work.

## 2020-11-10 ENCOUNTER — Other Ambulatory Visit: Payer: Self-pay | Admitting: *Deleted

## 2020-11-10 MED ORDER — FREESTYLE LANCETS MISC: 12 refills | Status: DC

## 2020-11-11 ENCOUNTER — Other Ambulatory Visit: Payer: Medicare HMO | Admitting: *Deleted

## 2020-11-11 ENCOUNTER — Telehealth: Payer: Self-pay | Admitting: *Deleted

## 2020-11-11 ENCOUNTER — Other Ambulatory Visit: Payer: Self-pay

## 2020-11-11 DIAGNOSIS — R55 Syncope and collapse: Secondary | ICD-10-CM

## 2020-11-11 DIAGNOSIS — I10 Essential (primary) hypertension: Secondary | ICD-10-CM

## 2020-11-11 DIAGNOSIS — I951 Orthostatic hypotension: Secondary | ICD-10-CM | POA: Diagnosis not present

## 2020-11-11 NOTE — Telephone Encounter (Signed)
OK for medication education nurse to visit patient, per Dr Melford Aase.

## 2020-11-12 ENCOUNTER — Telehealth: Payer: Self-pay

## 2020-11-12 ENCOUNTER — Other Ambulatory Visit: Payer: Self-pay

## 2020-11-12 DIAGNOSIS — I69354 Hemiplegia and hemiparesis following cerebral infarction affecting left non-dominant side: Secondary | ICD-10-CM | POA: Diagnosis not present

## 2020-11-12 DIAGNOSIS — E1122 Type 2 diabetes mellitus with diabetic chronic kidney disease: Secondary | ICD-10-CM | POA: Diagnosis not present

## 2020-11-12 DIAGNOSIS — E785 Hyperlipidemia, unspecified: Secondary | ICD-10-CM | POA: Diagnosis not present

## 2020-11-12 DIAGNOSIS — I129 Hypertensive chronic kidney disease with stage 1 through stage 4 chronic kidney disease, or unspecified chronic kidney disease: Secondary | ICD-10-CM | POA: Diagnosis not present

## 2020-11-12 DIAGNOSIS — N183 Chronic kidney disease, stage 3 unspecified: Secondary | ICD-10-CM | POA: Diagnosis not present

## 2020-11-12 DIAGNOSIS — I951 Orthostatic hypotension: Secondary | ICD-10-CM | POA: Diagnosis not present

## 2020-11-12 DIAGNOSIS — E876 Hypokalemia: Secondary | ICD-10-CM | POA: Diagnosis not present

## 2020-11-12 DIAGNOSIS — E559 Vitamin D deficiency, unspecified: Secondary | ICD-10-CM | POA: Diagnosis not present

## 2020-11-12 DIAGNOSIS — I10 Essential (primary) hypertension: Secondary | ICD-10-CM

## 2020-11-12 DIAGNOSIS — F0390 Unspecified dementia without behavioral disturbance: Secondary | ICD-10-CM | POA: Diagnosis not present

## 2020-11-12 LAB — BASIC METABOLIC PANEL
BUN/Creatinine Ratio: 15 (ref 10–24)
BUN: 24 mg/dL (ref 8–27)
CO2: 22 mmol/L (ref 20–29)
Calcium: 9.6 mg/dL (ref 8.6–10.2)
Chloride: 106 mmol/L (ref 96–106)
Creatinine, Ser: 1.55 mg/dL — ABNORMAL HIGH (ref 0.76–1.27)
GFR calc Af Amer: 50 mL/min/{1.73_m2} — ABNORMAL LOW (ref >59)
GFR calc non Af Amer: 43 mL/min/{1.73_m2} — ABNORMAL LOW (ref >59)
Glucose: 142 mg/dL — ABNORMAL HIGH (ref 65–99)
Potassium: 4.5 mmol/L (ref 3.5–5.2)
Sodium: 144 mmol/L (ref 134–144)

## 2020-11-12 MED ORDER — LOSARTAN POTASSIUM 50 MG PO TABS: 50.0000 mg | ORAL_TABLET | Freq: Every day | ORAL | 3 refills | Status: DC

## 2020-11-12 NOTE — Telephone Encounter (Signed)
  Patient Consent for Virtual Visit         Mathew Miller. has provided verbal consent on 11/12/2020 for a virtual visit (video or telephone).   CONSENT FOR VIRTUAL VISIT FOR:  Mathew Miller.  By participating in this virtual visit I agree to the following:  I hereby voluntarily request, consent and authorize [facility] and its employed or contracted physicians, physician assistants, nurse practitioners or other licensed health care professionals (the Practitioner), to provide me with telemedicine health care services (the "Services") as deemed necessary by the treating Practitioner. I acknowledge and consent to receive the Services by the Practitioner via telemedicine. I understand that the telemedicine visit will involve communicating with the Practitioner through live audiovisual communication technology and the disclosure of certain medical information by electronic transmission. I acknowledge that I have been given the opportunity to request an in-person assessment or other available alternative prior to the telemedicine visit and am voluntarily participating in the telemedicine visit.  I understand that I have the right to withhold or withdraw my consent to the use of telemedicine in the course of my care at any time, without affecting my right to future care or treatment, and that the Practitioner or I may terminate the telemedicine visit at any time. I understand that I have the right to inspect all information obtained and/or recorded in the course of the telemedicine visit and may receive copies of available information for a reasonable fee.  I understand that some of the potential risks of receiving the Services via telemedicine include:  Marland Kitchen Delay or interruption in medical evaluation due to technological equipment failure or disruption; . Information transmitted may not be sufficient (e.g. poor resolution of images) to allow for appropriate medical decision making by the  Practitioner; and/or  . In rare instances, security protocols could fail, causing a breach of personal health information.  Furthermore, I acknowledge that it is my responsibility to provide information about my medical history, conditions and care that is complete and accurate to the best of my ability. I acknowledge that Practitioner's advice, recommendations, and/or decision may be based on factors not within their control, such as incomplete or inaccurate data provided by me or distortions of diagnostic images or specimens that may result from electronic transmissions. I understand that the practice of medicine is not an exact science and that Practitioner makes no warranties or guarantees regarding treatment outcomes. I acknowledge that a copy of this consent can be made available to me via my patient portal ([facility]), or I can request a printed copy by calling the office of [facility].    I understand that my insurance will be billed for this visit.   I have read or had this consent read to me. . I understand the contents of this consent, which adequately explains the benefits and risks of the Services being provided via telemedicine.  . I have been provided ample opportunity to ask questions regarding this consent and the Services and have had my questions answered to my satisfaction. . I give my informed consent for the services to be provided through the use of telemedicine in my medical care

## 2020-11-12 NOTE — Telephone Encounter (Signed)
I am very concerned that he has a neurodegenerative d/o as outlined in my last OV note.  Has he gotten in with Neuro yet?  The meds should not make him sleepy but something like Parkinsons could and he has had a rapid decline in his mental status.  Cannot use Proamatine or Florinef because his supine BP is too high. He is not really having any low readings now after addition of mestinon.   Recommend increasing  Losartan to 50mg  daily for HTN and followup in office with PA next week for repeat orthostatic BP readings.  He needs to see Neuro ASAP

## 2020-11-12 NOTE — Telephone Encounter (Signed)
Spoke with the patient's wife and advised her on Dr. Theodosia Blender recommendations. He will increase losartan to 50 mg daily. Patient's wife is still very concerned about his declining condition. I advised her that I was going to call over to [facility] to see if the patient can be seen sooner per Dr. Theodosia Blender request. The patient is scheduled to see Dr. Radford Pax next week in the office however Dr. Radford Pax will be virtual clinic now. There are no openings with APPs in office. The patient's wife says that it is really difficult to get the patient into the office and that they can do a virtual video visit. Patient's wife says that they have PT/OT that comes and they will be able to take orthostatic BP's for the patient prior to the appointment.

## 2020-11-12 NOTE — Telephone Encounter (Signed)
Patient and his wife came by the office for lab work today. Patient's wife is very concerned in regards to the patient's fatigue. She states that he has been sleeping all day and has not been able to do his at home physical therapy anymore. She states that his mind has gotten worse. He was worked up for a UTI by his PCP but it was negative. She states that the patient declined after medication changes that were made at the office visit 01/12.  Medications that were stopped include: amlodipine, hydralazine, florinef, midodrine, and seroquel Patient was started on mestinon 30 mg BID and losartan 25 mg daily New medications were started on 1/13 and blood pressures are as follows: 1/13: standing - 122/92, lying: 182/104, sitting: 138/74, standing: 82/54, sitting: 173/108 1/14: lying: 124/80, sitting: 125/91 1/15: lying: 149/91, 175/99, 163/98 1/16: lying: 172/102, 150/94 1/17: 166/92, 152/91 1/18: lying: 151/93, 152/86, 173/99 1/19: 165/108, 167/99, 148/100 1/20: 113/74, 145/85, 171/96 1/21: 129/81, 160/94 1/22: 161/86, sitting: 146/85 1/23: 144/82, sitting: 143/81, after bath: 118/78 1/24: 158/104, 148/92, 133/88

## 2020-11-13 ENCOUNTER — Telehealth: Payer: Self-pay | Admitting: Cardiology

## 2020-11-13 DIAGNOSIS — E1122 Type 2 diabetes mellitus with diabetic chronic kidney disease: Secondary | ICD-10-CM | POA: Diagnosis not present

## 2020-11-13 DIAGNOSIS — E785 Hyperlipidemia, unspecified: Secondary | ICD-10-CM | POA: Diagnosis not present

## 2020-11-13 DIAGNOSIS — N183 Chronic kidney disease, stage 3 unspecified: Secondary | ICD-10-CM | POA: Diagnosis not present

## 2020-11-13 DIAGNOSIS — F0390 Unspecified dementia without behavioral disturbance: Secondary | ICD-10-CM | POA: Diagnosis not present

## 2020-11-13 DIAGNOSIS — E559 Vitamin D deficiency, unspecified: Secondary | ICD-10-CM | POA: Diagnosis not present

## 2020-11-13 DIAGNOSIS — I69354 Hemiplegia and hemiparesis following cerebral infarction affecting left non-dominant side: Secondary | ICD-10-CM | POA: Diagnosis not present

## 2020-11-13 DIAGNOSIS — E876 Hypokalemia: Secondary | ICD-10-CM | POA: Diagnosis not present

## 2020-11-13 DIAGNOSIS — I951 Orthostatic hypotension: Secondary | ICD-10-CM | POA: Diagnosis not present

## 2020-11-13 DIAGNOSIS — I129 Hypertensive chronic kidney disease with stage 1 through stage 4 chronic kidney disease, or unspecified chronic kidney disease: Secondary | ICD-10-CM | POA: Diagnosis not present

## 2020-11-13 NOTE — Telephone Encounter (Signed)
Patient has a virtual visit with Dr. Radford Pax, 11/21/2020. Patient increased Losartan to 50 mg daily yesrterday per Dr. Theodosia Blender recommendation. Patient wanted Dr. Radford Pax to know the following message below in case she would like to make any changes prior to 11/21/2020 virtual appt.   "Also will you please let Dr. Radford Pax know that yesterday was the first time I have been able to get his vitals laying and setting. The PT was here and helped. Early AM I got 135/86 laying and then she got 148/98 and sit up and it dropped to 88/63. After doing a little exercise it came up to 105/34. I increased Losartin to 80 yesterday so we will see what happens today. Thank you for messaging me back and please relay my message to Dr. Radford Pax."

## 2020-11-13 NOTE — Telephone Encounter (Signed)
Wife sent message to pt scheduling regarding Mathew Miller appt on 11/21/20 with Dr. Radford Pax. They are going to do virtual but wanted me to relay this message to Dr. Radford Pax and her nurse,  "Also will you please let Dr. Radford Pax know that yesterday was the first time I have been able to get his vitals laying and setting.  The PT was here and helped. Early AM I got 135/86 laying and then she got 148/98 and sit up and it dropped to 88/63.  After doing a little exercise it came up to 105/34.  I increased Losartin to 33 yesterday so we will see what happens today.  Thank you for messaging me back and please relay my message to Dr. Radford Pax."

## 2020-11-15 DIAGNOSIS — E1122 Type 2 diabetes mellitus with diabetic chronic kidney disease: Secondary | ICD-10-CM | POA: Diagnosis not present

## 2020-11-15 DIAGNOSIS — E785 Hyperlipidemia, unspecified: Secondary | ICD-10-CM | POA: Diagnosis not present

## 2020-11-15 DIAGNOSIS — E876 Hypokalemia: Secondary | ICD-10-CM | POA: Diagnosis not present

## 2020-11-15 DIAGNOSIS — I69354 Hemiplegia and hemiparesis following cerebral infarction affecting left non-dominant side: Secondary | ICD-10-CM | POA: Diagnosis not present

## 2020-11-15 DIAGNOSIS — E559 Vitamin D deficiency, unspecified: Secondary | ICD-10-CM | POA: Diagnosis not present

## 2020-11-15 DIAGNOSIS — I129 Hypertensive chronic kidney disease with stage 1 through stage 4 chronic kidney disease, or unspecified chronic kidney disease: Secondary | ICD-10-CM | POA: Diagnosis not present

## 2020-11-15 DIAGNOSIS — F0390 Unspecified dementia without behavioral disturbance: Secondary | ICD-10-CM | POA: Diagnosis not present

## 2020-11-15 DIAGNOSIS — N183 Chronic kidney disease, stage 3 unspecified: Secondary | ICD-10-CM | POA: Diagnosis not present

## 2020-11-15 DIAGNOSIS — I951 Orthostatic hypotension: Secondary | ICD-10-CM | POA: Diagnosis not present

## 2020-11-17 DIAGNOSIS — N183 Chronic kidney disease, stage 3 unspecified: Secondary | ICD-10-CM | POA: Diagnosis not present

## 2020-11-17 DIAGNOSIS — E1122 Type 2 diabetes mellitus with diabetic chronic kidney disease: Secondary | ICD-10-CM | POA: Diagnosis not present

## 2020-11-17 DIAGNOSIS — I951 Orthostatic hypotension: Secondary | ICD-10-CM | POA: Diagnosis not present

## 2020-11-17 DIAGNOSIS — I129 Hypertensive chronic kidney disease with stage 1 through stage 4 chronic kidney disease, or unspecified chronic kidney disease: Secondary | ICD-10-CM | POA: Diagnosis not present

## 2020-11-17 DIAGNOSIS — F0390 Unspecified dementia without behavioral disturbance: Secondary | ICD-10-CM | POA: Diagnosis not present

## 2020-11-17 DIAGNOSIS — E876 Hypokalemia: Secondary | ICD-10-CM | POA: Diagnosis not present

## 2020-11-17 DIAGNOSIS — I69354 Hemiplegia and hemiparesis following cerebral infarction affecting left non-dominant side: Secondary | ICD-10-CM | POA: Diagnosis not present

## 2020-11-17 DIAGNOSIS — E785 Hyperlipidemia, unspecified: Secondary | ICD-10-CM | POA: Diagnosis not present

## 2020-11-17 DIAGNOSIS — E559 Vitamin D deficiency, unspecified: Secondary | ICD-10-CM | POA: Diagnosis not present

## 2020-11-18 DIAGNOSIS — I129 Hypertensive chronic kidney disease with stage 1 through stage 4 chronic kidney disease, or unspecified chronic kidney disease: Secondary | ICD-10-CM | POA: Diagnosis not present

## 2020-11-18 DIAGNOSIS — I69354 Hemiplegia and hemiparesis following cerebral infarction affecting left non-dominant side: Secondary | ICD-10-CM | POA: Diagnosis not present

## 2020-11-18 DIAGNOSIS — E1122 Type 2 diabetes mellitus with diabetic chronic kidney disease: Secondary | ICD-10-CM | POA: Diagnosis not present

## 2020-11-18 DIAGNOSIS — E559 Vitamin D deficiency, unspecified: Secondary | ICD-10-CM | POA: Diagnosis not present

## 2020-11-18 DIAGNOSIS — E785 Hyperlipidemia, unspecified: Secondary | ICD-10-CM | POA: Diagnosis not present

## 2020-11-18 DIAGNOSIS — F0390 Unspecified dementia without behavioral disturbance: Secondary | ICD-10-CM | POA: Diagnosis not present

## 2020-11-18 DIAGNOSIS — I951 Orthostatic hypotension: Secondary | ICD-10-CM | POA: Diagnosis not present

## 2020-11-18 DIAGNOSIS — E876 Hypokalemia: Secondary | ICD-10-CM | POA: Diagnosis not present

## 2020-11-18 DIAGNOSIS — N183 Chronic kidney disease, stage 3 unspecified: Secondary | ICD-10-CM | POA: Diagnosis not present

## 2020-11-19 DIAGNOSIS — N183 Chronic kidney disease, stage 3 unspecified: Secondary | ICD-10-CM | POA: Diagnosis not present

## 2020-11-19 DIAGNOSIS — I951 Orthostatic hypotension: Secondary | ICD-10-CM | POA: Diagnosis not present

## 2020-11-19 DIAGNOSIS — E785 Hyperlipidemia, unspecified: Secondary | ICD-10-CM | POA: Diagnosis not present

## 2020-11-19 DIAGNOSIS — F0390 Unspecified dementia without behavioral disturbance: Secondary | ICD-10-CM | POA: Diagnosis not present

## 2020-11-19 DIAGNOSIS — E1122 Type 2 diabetes mellitus with diabetic chronic kidney disease: Secondary | ICD-10-CM | POA: Diagnosis not present

## 2020-11-19 DIAGNOSIS — E559 Vitamin D deficiency, unspecified: Secondary | ICD-10-CM | POA: Diagnosis not present

## 2020-11-19 DIAGNOSIS — I129 Hypertensive chronic kidney disease with stage 1 through stage 4 chronic kidney disease, or unspecified chronic kidney disease: Secondary | ICD-10-CM | POA: Diagnosis not present

## 2020-11-19 DIAGNOSIS — I69354 Hemiplegia and hemiparesis following cerebral infarction affecting left non-dominant side: Secondary | ICD-10-CM | POA: Diagnosis not present

## 2020-11-19 DIAGNOSIS — E876 Hypokalemia: Secondary | ICD-10-CM | POA: Diagnosis not present

## 2020-11-20 ENCOUNTER — Other Ambulatory Visit: Payer: Self-pay

## 2020-11-20 ENCOUNTER — Other Ambulatory Visit: Payer: Medicare HMO

## 2020-11-20 DIAGNOSIS — F0390 Unspecified dementia without behavioral disturbance: Secondary | ICD-10-CM | POA: Diagnosis not present

## 2020-11-20 DIAGNOSIS — N183 Chronic kidney disease, stage 3 unspecified: Secondary | ICD-10-CM | POA: Diagnosis not present

## 2020-11-20 DIAGNOSIS — I951 Orthostatic hypotension: Secondary | ICD-10-CM | POA: Diagnosis not present

## 2020-11-20 DIAGNOSIS — I69354 Hemiplegia and hemiparesis following cerebral infarction affecting left non-dominant side: Secondary | ICD-10-CM | POA: Diagnosis not present

## 2020-11-20 DIAGNOSIS — E876 Hypokalemia: Secondary | ICD-10-CM | POA: Diagnosis not present

## 2020-11-20 DIAGNOSIS — I129 Hypertensive chronic kidney disease with stage 1 through stage 4 chronic kidney disease, or unspecified chronic kidney disease: Secondary | ICD-10-CM | POA: Diagnosis not present

## 2020-11-20 DIAGNOSIS — I10 Essential (primary) hypertension: Secondary | ICD-10-CM

## 2020-11-20 DIAGNOSIS — E785 Hyperlipidemia, unspecified: Secondary | ICD-10-CM | POA: Diagnosis not present

## 2020-11-20 DIAGNOSIS — E559 Vitamin D deficiency, unspecified: Secondary | ICD-10-CM | POA: Diagnosis not present

## 2020-11-20 DIAGNOSIS — E1122 Type 2 diabetes mellitus with diabetic chronic kidney disease: Secondary | ICD-10-CM | POA: Diagnosis not present

## 2020-11-20 NOTE — Progress Notes (Unsigned)
NEUROLOGY FOLLOW UP OFFICE NOTE  Mathew Miller. [id number]   Subjective:  Mathew Miller is a 75 year old male with hypertension, hyperlipidemia, prediabetes and sensorineural hearing loss who follows up for orthostatic hypotension and neurocognitive disorder.  He began experiencing dizziness in 2018.  Around that time, enalapril was switched to losartan due to cough.  He describes the dizziness as a sensation that he is going to pass out but has not lost consciousness.  It occurs when he is up and walking and resolves when he stops to rest.  There is no associated spinning sensation or unilateral numbness and weakness.  Blood pressure medication was discontinued and he did feel better.  However, he subsequently developed significant orthostatic hypotension (BP drops from 140s/80s to 60s/40s) with multiple syncopal episodes difficult to control due to co-existing hypertension.  He has been on midodrine and Florinef which had to be discontinued.  Echocardiogram in September 2021 was normal.  EKG showed prolonged QT thought to be secondary to Seroquel, which was discontinued.  He was started on Mestinon.  Also in 2018, she began experiencing short-term memory problems.  It is primarily word-finding difficulty.  He hasn't really exhibited repeating questions.  His wife does most of the driving.  He does not get disoriented on familiar routes.  His wife pays the bills.  He uses a pillbox.  He is able to bathe, dress and use the toilet independently.  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  He has no family history of dementia.  TSH from 11/29/17 was 4.83.  Currently being monitored.  B12 on 01/10/18 was 235.  He was advised to start OTC B12 1057mcg daily.  MRI of brain on 01/19/2018 showed cerebral atrophy and chronic small vessel ischemic changes in the cerebral white matter and pons.  MRI of cervical spine showed spondylosis with mild left foraminal  stenosis at C5-6 and moderate left foraminal stenosis at C6-7.  He had neuropsychological evaluation in October 2019 which demonstrated cognitive decline in multiple areas including processing speed, Control and instrumentation engineer, language abilities, executive functioning and delayed recall of information presented once.  Performance consistent with dementia but as patient and wife endorsed independence, he was diagnosed with subcortical and cortical multidomain mild cognitive impairment.  Over the next couple of years, he has had a cognitive decline.  He started exhibiting visual hallucinations such as children or cats in the house.  When riding in the car, he sometimes believes somebody else is sitting in the backseat.  Starting in the late afternoon, he may start acting busy, pretending that he is working in the shop.  Now that Seroquel was discontinued, this behavioral and hallucinations are worse, which may interrupt sleep.  However, he is not frightened or agitated by these hallucinations or behavior.  He denies tremor.  He denies difficulty using utensils or holding a cup.  He denies difficulty with swallowing.  He denies REM sleep behavior disorder.  He denies GI symptoms such as abdominal discomfort, constipation or diarrhea.  He denies anhidrosis.  He was hospitalized in September 2021 for fall and left sided weakness.  MRI of brain personally reviewed showed 2 acute infarcts in the right frontal white matter,embolic of unknown source, in addition to moderate chronic small vessel ischemic changes with multiple remote lacunar infarcts progressed since 2019.  No ventirculomegaly.  MRA of head showed mild to moderate right P2 stenosis but no major intracranial arterial occlusion  or stenosis.  Carotid doppler without hemodynamically significant stenosis.  2D echocardiogram with EF 60-65% and no cardiac source of emboli.  LDL 69 and Hgb A1c 4.9.  He was also noted to have hyperreflexia, so MRI cervical spine  was performed which showed severe left C5-6 and C6-7 neural foraminal stenosis but no significant spinal canal stenosis causing cord compression.  He was started on ASA 81mg  daily.    Symptoms, including confusion, unsteady gait and orthostatic hypotension, have been worse since the stroke.  He has a walker but mostly is nonambulatory.    PAST MEDICAL HISTORY: Past Medical History:  Diagnosis Date  . Constipation   . Hyperlipidemia   . Hypertension   . Prediabetes    no meds   . Vitamin D deficiency     MEDICATIONS: Current Outpatient Medications on File Prior to Visit  Medication Sig Dispense Refill  . aspirin EC 81 MG tablet Take 1 tablet (81 mg total) by mouth daily. Swallow whole. 30 tablet   . Lancets (FREESTYLE) lancets Use to check blood sugar daily-DXZ87.898 100 each 12  . losartan (COZAAR) 50 MG tablet Take 1 tablet (50 mg total) by mouth daily. 90 tablet 3  . Magnesium 250 MG TABS Take 250 mg by mouth daily.    . mirabegron ER (MYRBETRIQ) 50 MG TB24 tablet Take 1 tablet Daily for Bladder (Patient taking differently: Take 50 mg by mouth daily.) 90 tablet 0  . omeprazole (PRILOSEC) 20 MG capsule Take 1 capsule   2 x   /day   to Prevent Heartburn & Indigestion (Patient taking differently: Take 20 mg by mouth daily.) 180 capsule 0  . polyethylene glycol (MIRALAX / GLYCOLAX) 17 g packet Take 17 g by mouth daily. 14 each 0  . potassium chloride SA (KLOR-CON) 20 MEQ tablet Take 1 tablet 2 x /day for Potassium (Patient taking differently: Take 20 mEq by mouth 2 (two) times daily.) 180 tablet 3  . pyridostigmine (MESTINON) 60 MG tablet Take 0.5 tablets (30 mg total) by mouth in the morning and at bedtime. 90 tablet 3  . rosuvastatin (CRESTOR) 5 MG tablet Take 1 tablet Daily for Cholesterol (Patient taking differently: Take 5 mg by mouth daily.) 90 tablet 3  . senna-docusate (GNP STOOL SOFTENER/LAXATIVE) 8.6-50 MG tablet Take 1 tablet Daily for Constipation 90 tablet 3  .  sulfamethoxazole-trimethoprim (BACTRIM DS) 800-160 MG tablet Take    1 tablet    2 x /day    with Meals    for Infection 30 tablet 0  . vitamin B-12 (CYANOCOBALAMIN) 500 MCG tablet Dissolve 1 tablet under tongue daily (Patient taking differently: Take 500 mcg by mouth daily. Dissolve 1 tablet under tongue daily) 90 tablet 3   No current facility-administered medications on file prior to visit.    ALLERGIES: Allergies  Allergen Reactions  . Hydrocodone-Acetaminophen Nausea Only  . Sudafed [Pseudoephedrine Hcl]     Urinary retention  . Tetanus Toxoids     Itching, burning feeling, ran a fever    FAMILY HISTORY: Family History  Problem Relation Age of Onset  . Hyperlipidemia Mother   . Glaucoma Mother   . Heart disease Father   . Hypertension Father   . Colon cancer Neg Hx   . Esophageal cancer Neg Hx   . Pancreatic cancer Neg Hx   . Prostate cancer Neg Hx   . Rectal cancer Neg Hx   . Stomach cancer Neg Hx     SOCIAL HISTORY: Social History  Socioeconomic History  . Marital status: Married    Spouse name: Not on file  . Number of children: Not on file  . Years of education: Not on file  . Highest education level: Not on file  Occupational History  . Occupation: retired   Tobacco Use  . Smoking status: Never Smoker  . Smokeless tobacco: Never Used  Vaping Use  . Vaping Use: Never used  Substance and Sexual Activity  . Alcohol use: Yes    Alcohol/week: 0.0 standard drinks    Comment: very Rare /Beer.wine.  . Drug use: No  . Sexual activity: Not on file  Other Topics Concern  . Not on file  Social History Narrative  . Not on file   Social Determinants of Health   Financial Resource Strain: Not on file  Food Insecurity: Not on file  Transportation Needs: Not on file  Physical Activity: Not on file  Stress: Not on file  Social Connections: Not on file  Intimate Partner Violence: Not on file     Objective:  Blood pressure (!) 74/53, pulse 70, height 6\' 2"   (1.88 m), weight 160 lb (72.6 kg), SpO2 100 %. General: No acute distress.   Head:  Normocephalic/atraumatic Eyes:  Fundi examined but not visualized Neck: supple, no paraspinal tenderness, full range of motion Heart:  Regular rate and rhythm Lungs:  Clear to auscultation bilaterally Back: No paraspinal tenderness Neurological Exam:  [employer] Mental Exam 11/24/2020  Weekday Correct 0  Current year 0  What state are we in? 1  Amount spent 0  Amount left 0  # of Animals 0  5 objects recall 0  Number series 0  Hour markers 0  Time correct 0  Placed X in triangle correctly 1  Largest Figure 1  Name of male 0  Date back to work 0  Type of work 0  State she lived in 0  Total score 3   Unable to track in all directions.  Otherwise, CN II-XII intact. Maybe slight cogwheel rigidity in right wrist, muscle strength 4/5 left upper extremity, 5-/5 right upper extremity, 3/5 lower extremities, bradykinesia, no tremor.  Sensation to light touch intact.  Deep tendon reflexes 3+ throughout, toes upgoing.  Finger to nose slow but intact.  In wheelchair.  Uses arms to push self up.  Unable to stand too long.     Assessment/Plan:  1.  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 2.  Hyperreflexia - unknown etiology. No explanation in brain or spinal cord.  Other than symptomatic management, there is not much in way of specific treatment. 1.  Will initiate donepezil 5mg  at bedtime, increasing to 10mg  at bedtime in one month 2.  Management of blood pressure as per cardiology 3.  Defer carbidopa-levodopa trial for now 4.  Continue physical therapy 5.  Follow up in 6 months.  Metta Clines, DO  CC:  Unk Pinto,  MD

## 2020-11-21 ENCOUNTER — Telehealth: Payer: Medicare HMO | Admitting: Cardiology

## 2020-11-21 DIAGNOSIS — I951 Orthostatic hypotension: Secondary | ICD-10-CM | POA: Diagnosis not present

## 2020-11-21 DIAGNOSIS — F0390 Unspecified dementia without behavioral disturbance: Secondary | ICD-10-CM | POA: Diagnosis not present

## 2020-11-21 DIAGNOSIS — I129 Hypertensive chronic kidney disease with stage 1 through stage 4 chronic kidney disease, or unspecified chronic kidney disease: Secondary | ICD-10-CM | POA: Diagnosis not present

## 2020-11-21 DIAGNOSIS — E876 Hypokalemia: Secondary | ICD-10-CM | POA: Diagnosis not present

## 2020-11-21 DIAGNOSIS — I69354 Hemiplegia and hemiparesis following cerebral infarction affecting left non-dominant side: Secondary | ICD-10-CM | POA: Diagnosis not present

## 2020-11-21 DIAGNOSIS — E785 Hyperlipidemia, unspecified: Secondary | ICD-10-CM | POA: Diagnosis not present

## 2020-11-21 DIAGNOSIS — E559 Vitamin D deficiency, unspecified: Secondary | ICD-10-CM | POA: Diagnosis not present

## 2020-11-21 DIAGNOSIS — N183 Chronic kidney disease, stage 3 unspecified: Secondary | ICD-10-CM | POA: Diagnosis not present

## 2020-11-21 DIAGNOSIS — E1122 Type 2 diabetes mellitus with diabetic chronic kidney disease: Secondary | ICD-10-CM | POA: Diagnosis not present

## 2020-11-21 LAB — BASIC METABOLIC PANEL
BUN/Creatinine Ratio: 18 (ref 10–24)
BUN: 25 mg/dL (ref 8–27)
CO2: 22 mmol/L (ref 20–29)
Calcium: 9.5 mg/dL (ref 8.6–10.2)
Chloride: 104 mmol/L (ref 96–106)
Creatinine, Ser: 1.4 mg/dL — ABNORMAL HIGH (ref 0.76–1.27)
GFR calc Af Amer: 56 mL/min/{1.73_m2} — ABNORMAL LOW (ref >59)
GFR calc non Af Amer: 48 mL/min/{1.73_m2} — ABNORMAL LOW (ref >59)
Glucose: 117 mg/dL — ABNORMAL HIGH (ref 65–99)
Potassium: 4.1 mmol/L (ref 3.5–5.2)
Sodium: 141 mmol/L (ref 134–144)

## 2020-11-24 ENCOUNTER — Encounter: Payer: Self-pay | Admitting: Cardiology

## 2020-11-24 ENCOUNTER — Other Ambulatory Visit: Payer: Self-pay

## 2020-11-24 ENCOUNTER — Encounter: Payer: Self-pay | Admitting: Neurology

## 2020-11-24 ENCOUNTER — Telehealth (INDEPENDENT_AMBULATORY_CARE_PROVIDER_SITE_OTHER): Payer: Medicare HMO | Admitting: Cardiology

## 2020-11-24 ENCOUNTER — Ambulatory Visit: Payer: Medicare HMO | Admitting: Neurology

## 2020-11-24 VITALS — BP 88/63 | Temp 97.9°F | Ht 74.0 in | Wt 160.0 lb

## 2020-11-24 VITALS — BP 74/53 | HR 70 | Ht 74.0 in | Wt 160.0 lb

## 2020-11-24 DIAGNOSIS — I951 Orthostatic hypotension: Secondary | ICD-10-CM | POA: Diagnosis not present

## 2020-11-24 DIAGNOSIS — I10 Essential (primary) hypertension: Secondary | ICD-10-CM | POA: Diagnosis not present

## 2020-11-24 DIAGNOSIS — R55 Syncope and collapse: Secondary | ICD-10-CM | POA: Diagnosis not present

## 2020-11-24 DIAGNOSIS — G903 Multi-system degeneration of the autonomic nervous system: Secondary | ICD-10-CM

## 2020-11-24 DIAGNOSIS — R9431 Abnormal electrocardiogram [ECG] [EKG]: Secondary | ICD-10-CM | POA: Diagnosis not present

## 2020-11-24 DIAGNOSIS — G238 Other specified degenerative diseases of basal ganglia: Secondary | ICD-10-CM

## 2020-11-24 MED ORDER — DONEPEZIL HCL 5 MG PO TABS: 5.0000 mg | ORAL_TABLET | Freq: Every day | ORAL | 0 refills | Status: DC

## 2020-11-24 MED ORDER — PYRIDOSTIGMINE BROMIDE 60 MG PO TABS: 60.0000 mg | ORAL_TABLET | Freq: Two times a day (BID) | ORAL | 3 refills | Status: DC

## 2020-11-24 NOTE — Patient Instructions (Signed)
1.  We will start donepezil (Aricept) 5mg  daily for four weeks.  If you are tolerating the medication, then after four weeks, we will increase the dose to 10mg  daily.  Side effects include nausea, vomiting, diarrhea, vivid dreams, and muscle cramps.  Please call the clinic if you experience any of these symptoms.  2.  Follow up with Dr. Radford Pax for blood pressure control 3.   Continue physical therapy   Multiple System Atrophy Multiple system atrophy (MSA) is a rare disease of the brain and spinal cord (central nervous system) that causes a loss of nerve cells. Atrophy means loss of function. This condition used to be called Shy-Drager syndrome. Multiple system atrophy affects one or both of the following areas of the central nervous system:  Autonomic nervous system, which controls functions such as blood pressure, heart rate, and bladder function.  Motor system, which controls balance and muscle movements. Symptoms tend to get worse over time. There is no cure, but treatment may help control some of the symptoms. What are the causes? The cause of MSA is not known. What are the signs or symptoms? Symptoms of this condition depend on which part of your nervous system is affected. You may have symptoms that mainly affect either your autonomic or motor system, or you might have a combination of symptoms that affect both. Symptoms of autonomic nervous system disease include:  Low blood pressure when standing (orthostatic hypotension). This can cause dizziness or fainting.  Bladder control problems (incontinence).  Difficulty passing stool (constipation).  Difficulty getting an erection (erectile dysfunction).  Dry mouth and skin.  Irregular heartbeat. Symptoms of motor system disease include:  Rigid muscles.  Slowed movement.  Poor balance.  Trembling or tremor.  Clumsiness.  Trouble speaking.  Double vision.  Trouble chewing and swallowing.  Trouble breathing. How is  this diagnosed? At first, [place] can seem like other nervous system disorders. Because of this, it may take a long time to get a diagnosis of MSA. There is no single test that can be used to diagnose this condition. You may be referred to a specialist in nervous system diseases (neurologist). A neurologist may diagnose MSA by doing a physical and neurological exam and by evaluating your symptoms that get worse over time. Imaging studies of the brain may also be done, including:  MRI.  PET scan.  DaTscan. This is a test that uses a radioactive substance to see specific cells in the brain. How is this treated? No treatments can cure or slow the progression of MSA. The goal of treatment is to manage symptoms. Treatment may include:  Medicine to improve symptoms that affect your motor system.  Medicine to support blood pressure control.  Medicine to relieve constipation and control incontinence.  A feeding tube if swallowing becomes difficult.  Physical therapy to keep muscles healthy for as long as possible.  A walker, wheelchair, or other assistive devices if walking becomes difficult. Follow these instructions at home: Eating and drinking  Increase the amount of fiber in your diet to help prevent constipation.  Increase the amount of salt in your diet as directed by your health care provider. This can help prevent orthostatic hypotension.  Drink more fluid if your health care provider recommends. This will help with both constipation and low blood pressure.  Do not drink alcohol.   Lifestyle  Sleep with the head of your bed slightly raised. This may reduce orthostatic hypotension in the morning.  Talk with your health care provider  about using compression stockings. This might help with your blood pressure.  Stay as active as you can. Ask your health care provider to recommend a safe level of exercise for you.  Do not use any products that contain nicotine or tobacco, such as  cigarettes and e-cigarettes. If you need help quitting, ask your health care provider.   General instructions  Work closely with your health care providers. MSA is a progressive disease that will get worse over time.  Take over-the-counter and prescription medicines only as told by your health care provider.  Take steps to reduce the risk of falls at home.  Make sure that you have a good support system at home. Ask about home care assistance if needed.  Ask your health care provider about receiving palliative care to help manage symptoms and support your family members and loved ones.  Keep all follow-up visits as told by your health care provider. This is important. Contact a health care provider if:  Your symptoms change or get worse.  You need more support at home. Get help right away if you:  Have an injury from a fainting spell.  Choke when you try to swallow food or fluids.  Have trouble breathing.  Have chest pain. These symptoms may represent a serious problem that is an emergency. Do not wait to see if the symptoms will go away. Get medical help right away. Call your local emergency services (911 in the [place] Do not drive yourself to the hospital. Summary  Multiple system atrophy is a rare disease of the brain and spinal cord (central nervous system). It causes a loss of nerve cells.  Symptoms of this condition depend on which part of your nervous system is involved. You may have symptoms that mainly affect either your autonomic or motor system, or you might have a combination of symptoms that affect both.  Treatment depends on your symptoms. It may include medicines to support blood pressure, medicines for urinary incontinence or constipation, and physical therapy to help keep muscles strong.  Follow your health care provider's instructions about taking medicines, eating specific foods, preventing falls at home, and when to get medical help. This information is not  intended to replace advice given to you by your health care provider. Make sure you discuss any questions you have with your health care provider. Document Revised: 10/19/2017 Document Reviewed: 10/19/2017 Elsevier Patient Education  2021 [employer].

## 2020-11-24 NOTE — Addendum Note (Signed)
Addended by: Antonieta Iba on: 11/24/2020 10:04 AM   Modules accepted: Orders

## 2020-11-24 NOTE — Patient Instructions (Signed)
Medication Instructions:  Your physician has recommended you make the following change in your medication:  1) INCREASE Mestinon to 60 mg twice daily   *If you need a refill on your cardiac medications before your next appointment, please call your pharmacy*  Follow-Up: At [facility], you and your health needs are our priority.  As part of our continuing mission to provide you with exceptional heart care, we have created designated Provider Care Teams.  These Care Teams include your primary Cardiologist (physician) and Advanced Practice Providers (APPs -  Physician Assistants and Nurse Practitioners) who all work together to provide you with the care you need, when you need it.  We recommend signing up for the patient portal called "MyChart".  Sign up information is provided on this After Visit Summary.  MyChart is used to connect with patients for Virtual Visits (Telemedicine).  Patients are able to view lab/test results, encounter notes, upcoming appointments, etc.  Non-urgent messages can be sent to your provider as well.   To learn more about what you can do with MyChart, go to [email or web address].    Your next appointment:   2 week(s)  The format for your next appointment:   In Person  Provider:   Fransico Him, MD

## 2020-11-24 NOTE — Progress Notes (Addendum)
Virtual Visit via Video Note   This visit type was conducted due to national recommendations for restrictions regarding the COVID-19 Pandemic (e.g. social distancing) in an effort to limit this patient's exposure and mitigate transmission in our community.  Due to his co-morbid illnesses, this patient is at least at moderate risk for complications without adequate follow up.  This format is felt to be most appropriate for this patient at this time.  All issues noted in this document were discussed and addressed.  A limited physical exam was performed with this format.  Please refer to the patient's chart for his consent to telehealth for [facility].  Date:  11/24/2020   ID:  Mathew Phi., DOB 06-Apr-1946, MRN [id number] The patient was identified using 2 identifiers.  Patient Location: Home Provider Location: Home Office  PCP:  Unk Pinto, MD  Cardiologist:  Fransico Him, MD  Electrophysiologist:  None   Evaluation Performed:  Follow-Up Visit  Chief Complaint:  Orthostatic hypotension and Syncope  History of Present Illness:    Mathew Shatto. is a 75 y.o. male with Mathew Gellatly. is a 75 y.o. male  with a hx of HTN, HLD, Vit D def and vascular dementia.  He has a hx of ischemic frontal lobe CVA in Sept 2021 and was in SNF for a month.  He has has issues with HTN as well as orthostatic hypotension.  He was on midodrine and Florinef which had to be stopped due to HTN and Hydralazine was added to help with his elevated BP. Recently he has had more problems with postural hypotension and his Florinef was restarted by his PCP.   He was seen in initial evaluation by me for syncope and multiple episodes of orthostatic hypotension on 10/29/2020 and was markedly orthostatic with a drop in SBP from 141 to 58mmHg but has low as 32mmHg at home per his wife with syncope.  2D echo was normal in Sept 2021.  At that OV his amlodipine, Hydralazine and Seroquel were stopped.  His  florinef was also stopped due to problems with supine HTN.  He was started on Losartan 25mg  daily.  He was started on Mestinon 30mg  BID and TED hose changed to thigh high.  At that time I was very concerned that he could have a neurodegenerative disorder given recent development of severe dysautonomia and rapidly progressive dementia and I referred him to Neuro.    He is here today for followup and his history was obtained by his wife.  She tells me that after they saw me on 10/30/2019, he got very sleepy and slept for 10 days pretty much all the time and could not feed himself or bathe and then that seemed to wear off after that.  Now he is more alert but still walks with a walker.  He is back to bathing himself and feeding himself.  The memory very bad.  He is to see the Neurologist for evaluation later today.  He has continued to have numerous episodes of syncope associated with marked drops in BP and has had syncope 3 times in the last 2 weeks and occurs after standing.  He denies any chest pain or pressure, SOB, DOE, PND, orthopnea, LE edema or palpitations . he is compliant with his meds and is tolerating meds with no SE.    The patient does not have symptoms concerning for COVID-19 infection (fever, chills, cough, or new shortness of breath).   Past Medical  History:  Diagnosis Date  . Constipation   . Hyperlipidemia   . Hypertension   . Prediabetes    no meds   . Vitamin D deficiency    Past Surgical History:  Procedure Laterality Date  . CATARACT EXTRACTION, BILATERAL  2017  . COLONOSCOPY    . NO PAST SURGERIES       Current Meds  Medication Sig  . aspirin EC 81 MG tablet Take 1 tablet (81 mg total) by mouth daily. Swallow whole.  . Lancets (FREESTYLE) lancets Use to check blood sugar daily-DXZ87.898  . losartan (COZAAR) 50 MG tablet Take 1 tablet (50 mg total) by mouth daily.  . Magnesium 250 MG TABS Take 250 mg by mouth daily.  . mirabegron ER (MYRBETRIQ) 50 MG TB24 tablet Take 1  tablet Daily for Bladder (Patient taking differently: Take 50 mg by mouth daily.)  . omeprazole (PRILOSEC) 20 MG capsule Take 1 capsule   2 x   /day   to Prevent Heartburn & Indigestion (Patient taking differently: Take 20 mg by mouth daily.)  . polyethylene glycol (MIRALAX / GLYCOLAX) 17 g packet Take 17 g by mouth daily.  . potassium chloride SA (KLOR-CON) 20 MEQ tablet Take 1 tablet 2 x /day for Potassium (Patient taking differently: Take 20 mEq by mouth 2 (two) times daily.)  . pyridostigmine (MESTINON) 60 MG tablet Take 0.5 tablets (30 mg total) by mouth in the morning and at bedtime.  . rosuvastatin (CRESTOR) 5 MG tablet Take 1 tablet Daily for Cholesterol (Patient taking differently: Take 5 mg by mouth daily.)  . senna-docusate (GNP STOOL SOFTENER/LAXATIVE) 8.6-50 MG tablet Take 1 tablet Daily for Constipation  . sulfamethoxazole-trimethoprim (BACTRIM DS) 800-160 MG tablet Take    1 tablet    2 x /day    with Meals    for Infection  . vitamin B-12 (CYANOCOBALAMIN) 500 MCG tablet Dissolve 1 tablet under tongue daily (Patient taking differently: Take 500 mcg by mouth daily. Dissolve 1 tablet under tongue daily)     Allergies:   Hydrocodone-acetaminophen, Sudafed [pseudoephedrine hcl], and Tetanus toxoids   Social History   Tobacco Use  . Smoking status: Never Smoker  . Smokeless tobacco: Never Used  Vaping Use  . Vaping Use: Never used  Substance Use Topics  . Alcohol use: Yes    Alcohol/week: 0.0 standard drinks    Comment: very Rare /Beer.wine.  . Drug use: No     Family Hx: The patient's family history includes Glaucoma in his mother; Heart disease in his father; Hyperlipidemia in his mother; Hypertension in his father. There is no history of Colon cancer, Esophageal cancer, Pancreatic cancer, Prostate cancer, Rectal cancer, or Stomach cancer.  ROS:   Please see the history of present illness.     All other systems reviewed and are negative.   Prior CV studies:   The  following studies were reviewed today:  none  Labs/Other Tests and Data Reviewed:    EKG:  No ECG reviewed.  Recent Labs: 09/05/2020: Magnesium 2.1; TSH 4.02 10/22/2020: ALT 13; Hemoglobin 12.8; Platelets 177 11/20/2020: BUN 25; Creatinine, Ser 1.40; Potassium 4.1; Sodium 141   Recent Lipid Panel Lab Results  Component Value Date/Time   CHOL 130 07/16/2020 03:40 AM   TRIG 65 07/16/2020 03:40 AM   HDL 48 07/16/2020 03:40 AM   CHOLHDL 2.7 07/16/2020 03:40 AM   LDLCALC 69 07/16/2020 03:40 AM   LDLCALC 65 03/27/2020 12:48 PM    Wt Readings from Last 3  Encounters:  11/24/20 160 lb (72.6 kg)  10/29/20 160 lb (72.6 kg)  09/05/20 150 lb (68 kg)     Risk Assessment/Calculations:      Objective:    Vital Signs:  BP (!) 88/63 (Patient Position: Sitting)   Temp 97.9 F (36.6 C)   Ht 6\' 2"  (1.88 m)   Wt 160 lb (72.6 kg)   BMI 20.54 kg/m   Well nourished, well developed male in no acute distress. Well appearing, alert and conversant, regular work of breathing,  good skin color  Eyes- anicteric mouth- oral mucosa is pink  neuro- grossly intact skin- no apparent rash or lesions or cyanosis   ASSESSMENT & PLAN:    1.  Orthostatic hypotension -he has had multiple syncopal episodes with sitting and standing and profound orthostatic hypotension -2D echo normal in Sept 2021 -at last OV he was on several drugs that would potentiate orthostasis including amlodipine (peripheral vasodilator), hydralazine (alpha blocker) and Seroquel and these were all stopped -need to avoid proamatine and florinef due to propensity to exacerbate supine HTN -started Mestinon 30mg  BID at last OV but still very orthostatic just sitting up so will increase Mestinon to 60mg  BID -continue thigh high compression hose and abdominal binder -I am very concerned that he may had a neurodegenerative disorder that is presenting as severe dysautonomia.  He was dx about 1 year ago with dementia and his wife says that  it has progressed rapidly. He is seen Neuro for his initial visit today  2.  HTN -he has had wide fluctuations in his BP and still has BP elevations as high as 180's on Losartan 50mg  daily but I do not want to increase any further given his profound orthostatic hypotension -continue Losartan 50mg  daily   3.  Syncope -he has had multiple syncopal episodes when standing and sitting -he has passed out just sitting in the car seat -I suspect that this is all related to his orthostatic hypotension -he wore a heart monitor for 4 days in 2020 showing PACs and PVCs as well as a short run of SVT -Event monitor was ordered but not been done and his wife wants to wait for now since his syncope occurs when his BP drops -treat for dysautonomia as per #1  4.  Prolonged QT -his QTc is prolonged in Oct and likely related to Seroquel which has been stopped -I have instructed is wife to stop his seroquel    COVID-19 Education: The signs and symptoms of COVID-19 were discussed with the patient and how to seek care for testing (follow up with PCP or arrange E-visit).  The importance of social distancing was discussed today.  Time:   Today, I have spent 20 minutes with the patient with telehealth technology discussing the above problems.     Medication Adjustments/Labs and Tests Ordered: Current medicines are reviewed at length with the patient today.  Concerns regarding medicines are outlined above.   Tests Ordered: No orders of the defined types were placed in this encounter.   Medication Changes: No orders of the defined types were placed in this encounter.   Follow Up:  In Person in 2 week(s)  Signed, Fransico Him, MD  11/24/2020 8:56 AM    [place]

## 2020-11-26 ENCOUNTER — Encounter: Payer: Self-pay | Admitting: Radiology

## 2020-11-26 ENCOUNTER — Telehealth: Payer: Self-pay

## 2020-11-26 DIAGNOSIS — E785 Hyperlipidemia, unspecified: Secondary | ICD-10-CM | POA: Diagnosis not present

## 2020-11-26 DIAGNOSIS — E559 Vitamin D deficiency, unspecified: Secondary | ICD-10-CM | POA: Diagnosis not present

## 2020-11-26 DIAGNOSIS — R9431 Abnormal electrocardiogram [ECG] [EKG]: Secondary | ICD-10-CM

## 2020-11-26 DIAGNOSIS — R55 Syncope and collapse: Secondary | ICD-10-CM

## 2020-11-26 DIAGNOSIS — I69354 Hemiplegia and hemiparesis following cerebral infarction affecting left non-dominant side: Secondary | ICD-10-CM | POA: Diagnosis not present

## 2020-11-26 DIAGNOSIS — N183 Chronic kidney disease, stage 3 unspecified: Secondary | ICD-10-CM | POA: Diagnosis not present

## 2020-11-26 DIAGNOSIS — I951 Orthostatic hypotension: Secondary | ICD-10-CM | POA: Diagnosis not present

## 2020-11-26 DIAGNOSIS — E876 Hypokalemia: Secondary | ICD-10-CM | POA: Diagnosis not present

## 2020-11-26 DIAGNOSIS — I129 Hypertensive chronic kidney disease with stage 1 through stage 4 chronic kidney disease, or unspecified chronic kidney disease: Secondary | ICD-10-CM | POA: Diagnosis not present

## 2020-11-26 DIAGNOSIS — E1122 Type 2 diabetes mellitus with diabetic chronic kidney disease: Secondary | ICD-10-CM | POA: Diagnosis not present

## 2020-11-26 DIAGNOSIS — F0390 Unspecified dementia without behavioral disturbance: Secondary | ICD-10-CM | POA: Diagnosis not present

## 2020-11-26 NOTE — Progress Notes (Signed)
Enrolled patient for a 30 day [employer] Event Monitor to be mailed to patients home  

## 2020-11-26 NOTE — Telephone Encounter (Signed)
The patient's wife has been notified of the result and verbalized understanding.  All questions (if any) were answered. Event monitor has been ordered.  Antonieta Iba, RN 11/26/2020 11:19 AM

## 2020-11-26 NOTE — Telephone Encounter (Signed)
-----   Message from Sueanne Margarita, MD sent at 11/25/2020 10:24 PM EST ----- Please let patient's wife know that I have decided to proceed with 30 day event monitor since he had a prolonged QT interval which can be associated with arrhythmias that make people pass out.  Renal function stable on ARB

## 2020-11-27 DIAGNOSIS — F0390 Unspecified dementia without behavioral disturbance: Secondary | ICD-10-CM | POA: Diagnosis not present

## 2020-11-27 DIAGNOSIS — E1122 Type 2 diabetes mellitus with diabetic chronic kidney disease: Secondary | ICD-10-CM | POA: Diagnosis not present

## 2020-11-27 DIAGNOSIS — E559 Vitamin D deficiency, unspecified: Secondary | ICD-10-CM | POA: Diagnosis not present

## 2020-11-27 DIAGNOSIS — I951 Orthostatic hypotension: Secondary | ICD-10-CM | POA: Diagnosis not present

## 2020-11-27 DIAGNOSIS — N183 Chronic kidney disease, stage 3 unspecified: Secondary | ICD-10-CM | POA: Diagnosis not present

## 2020-11-27 DIAGNOSIS — I129 Hypertensive chronic kidney disease with stage 1 through stage 4 chronic kidney disease, or unspecified chronic kidney disease: Secondary | ICD-10-CM | POA: Diagnosis not present

## 2020-11-27 DIAGNOSIS — I69354 Hemiplegia and hemiparesis following cerebral infarction affecting left non-dominant side: Secondary | ICD-10-CM | POA: Diagnosis not present

## 2020-11-27 DIAGNOSIS — E785 Hyperlipidemia, unspecified: Secondary | ICD-10-CM | POA: Diagnosis not present

## 2020-11-27 DIAGNOSIS — E876 Hypokalemia: Secondary | ICD-10-CM | POA: Diagnosis not present

## 2020-11-28 DIAGNOSIS — I951 Orthostatic hypotension: Secondary | ICD-10-CM | POA: Diagnosis not present

## 2020-11-28 DIAGNOSIS — I129 Hypertensive chronic kidney disease with stage 1 through stage 4 chronic kidney disease, or unspecified chronic kidney disease: Secondary | ICD-10-CM | POA: Diagnosis not present

## 2020-11-28 DIAGNOSIS — E876 Hypokalemia: Secondary | ICD-10-CM | POA: Diagnosis not present

## 2020-11-28 DIAGNOSIS — F0390 Unspecified dementia without behavioral disturbance: Secondary | ICD-10-CM | POA: Diagnosis not present

## 2020-11-28 DIAGNOSIS — N183 Chronic kidney disease, stage 3 unspecified: Secondary | ICD-10-CM | POA: Diagnosis not present

## 2020-11-28 DIAGNOSIS — E1122 Type 2 diabetes mellitus with diabetic chronic kidney disease: Secondary | ICD-10-CM | POA: Diagnosis not present

## 2020-11-28 DIAGNOSIS — E785 Hyperlipidemia, unspecified: Secondary | ICD-10-CM | POA: Diagnosis not present

## 2020-11-28 DIAGNOSIS — E559 Vitamin D deficiency, unspecified: Secondary | ICD-10-CM | POA: Diagnosis not present

## 2020-11-28 DIAGNOSIS — I69354 Hemiplegia and hemiparesis following cerebral infarction affecting left non-dominant side: Secondary | ICD-10-CM | POA: Diagnosis not present

## 2020-12-01 ENCOUNTER — Emergency Department (HOSPITAL_BASED_OUTPATIENT_CLINIC_OR_DEPARTMENT_OTHER): Payer: Medicare HMO

## 2020-12-01 ENCOUNTER — Encounter (HOSPITAL_BASED_OUTPATIENT_CLINIC_OR_DEPARTMENT_OTHER): Payer: Self-pay

## 2020-12-01 ENCOUNTER — Other Ambulatory Visit: Payer: Self-pay

## 2020-12-01 ENCOUNTER — Emergency Department (HOSPITAL_BASED_OUTPATIENT_CLINIC_OR_DEPARTMENT_OTHER)
Admission: EM | Admit: 2020-12-01 | Discharge: 2020-12-01 | Disposition: A | Discharge status: Home / Self Care | Payer: Medicare HMO | Attending: Emergency Medicine | Admitting: Emergency Medicine

## 2020-12-01 DIAGNOSIS — Z7982 Long term (current) use of aspirin: Secondary | ICD-10-CM | POA: Diagnosis not present

## 2020-12-01 DIAGNOSIS — I129 Hypertensive chronic kidney disease with stage 1 through stage 4 chronic kidney disease, or unspecified chronic kidney disease: Secondary | ICD-10-CM | POA: Diagnosis not present

## 2020-12-01 DIAGNOSIS — Z79899 Other long term (current) drug therapy: Secondary | ICD-10-CM | POA: Diagnosis not present

## 2020-12-01 DIAGNOSIS — E559 Vitamin D deficiency, unspecified: Secondary | ICD-10-CM | POA: Diagnosis not present

## 2020-12-01 DIAGNOSIS — W01198A Fall on same level from slipping, tripping and stumbling with subsequent striking against other object, initial encounter: Secondary | ICD-10-CM | POA: Insufficient documentation

## 2020-12-01 DIAGNOSIS — I951 Orthostatic hypotension: Secondary | ICD-10-CM | POA: Diagnosis not present

## 2020-12-01 DIAGNOSIS — M4312 Spondylolisthesis, cervical region: Secondary | ICD-10-CM | POA: Diagnosis not present

## 2020-12-01 DIAGNOSIS — I69354 Hemiplegia and hemiparesis following cerebral infarction affecting left non-dominant side: Secondary | ICD-10-CM | POA: Diagnosis not present

## 2020-12-01 DIAGNOSIS — W19XXXA Unspecified fall, initial encounter: Secondary | ICD-10-CM

## 2020-12-01 DIAGNOSIS — N183 Chronic kidney disease, stage 3 unspecified: Secondary | ICD-10-CM | POA: Diagnosis not present

## 2020-12-01 DIAGNOSIS — I6782 Cerebral ischemia: Secondary | ICD-10-CM | POA: Diagnosis not present

## 2020-12-01 DIAGNOSIS — I10 Essential (primary) hypertension: Secondary | ICD-10-CM | POA: Diagnosis not present

## 2020-12-01 DIAGNOSIS — S199XXA Unspecified injury of neck, initial encounter: Secondary | ICD-10-CM | POA: Diagnosis not present

## 2020-12-01 DIAGNOSIS — E876 Hypokalemia: Secondary | ICD-10-CM | POA: Diagnosis not present

## 2020-12-01 DIAGNOSIS — M47812 Spondylosis without myelopathy or radiculopathy, cervical region: Secondary | ICD-10-CM | POA: Diagnosis not present

## 2020-12-01 DIAGNOSIS — E785 Hyperlipidemia, unspecified: Secondary | ICD-10-CM | POA: Diagnosis not present

## 2020-12-01 DIAGNOSIS — S0001XA Abrasion of scalp, initial encounter: Secondary | ICD-10-CM | POA: Insufficient documentation

## 2020-12-01 DIAGNOSIS — F0390 Unspecified dementia without behavioral disturbance: Secondary | ICD-10-CM | POA: Diagnosis not present

## 2020-12-01 DIAGNOSIS — E1122 Type 2 diabetes mellitus with diabetic chronic kidney disease: Secondary | ICD-10-CM | POA: Diagnosis not present

## 2020-12-01 DIAGNOSIS — S0990XA Unspecified injury of head, initial encounter: Secondary | ICD-10-CM | POA: Diagnosis not present

## 2020-12-01 NOTE — Discharge Instructions (Addendum)
Please return to the emergency department with any new or worsening symptoms.  It was a pleasure to meet you both.

## 2020-12-01 NOTE — ED Provider Notes (Signed)
[place] EMERGENCY DEPARTMENT Provider Note   CSN: [id number] Arrival date & time: 12/01/20  1655     History Chief Complaint  Patient presents with  . Fall    Mathew Miller. is a 75 y.o. male.  HPI Patient is a 75 year old male with a history of CVA as well as dementia who presents the emergency department due to a fall that occurred about 15 hours ago.  His wife is at bedside and provides most of the history.  She states that he ambulates with a walker at baseline but got up around 3 AM last night without his walker and tripped and fell on a tile floor.  She states that he struck the back of his head on a wooden door.  Patient has a small amount of erythema noted to the posterior neck and head.  She states that he has been behaving normally all day today.  He was being evaluated by physical therapy who recommended that he come to the emergency department for further work-up.  Patient is not anticoagulated.    Past Medical History:  Diagnosis Date  . Constipation   . Hyperlipidemia   . Hypertension   . Prediabetes    no meds   . Vitamin D deficiency     Patient Active Problem List   Diagnosis Date Noted  . Dementia without behavioral disturbance ([place])   . Palliative care by specialist   . CVA (cerebral vascular accident) ([place]) 07/15/2020  . Hypokalemia   . Left-sided weakness   . B12 deficiency 03/26/2020  . Unstable gait 10/01/2019  . Frequent falls 10/01/2019  . Imbalance 02/27/2018  . BMI 24.0-24.9, adult 02/26/2018  . Acid reflux 02/26/2018  . Memory changes 02/26/2018  . Vitamin D deficiency 03/01/2014  . Goals of care, counseling/discussion 03/01/2014  . Hyperlipidemia   . History of prediabetes   . HTN (hypertension) 10/09/2013    Past Surgical History:  Procedure Laterality Date  . CATARACT EXTRACTION, BILATERAL  2017  . COLONOSCOPY    . NO PAST SURGERIES         Family History  Problem Relation Age of Onset  . Hyperlipidemia  Mother   . Glaucoma Mother   . Heart disease Father   . Hypertension Father   . Colon cancer Neg Hx   . Esophageal cancer Neg Hx   . Pancreatic cancer Neg Hx   . Prostate cancer Neg Hx   . Rectal cancer Neg Hx   . Stomach cancer Neg Hx     Social History   Tobacco Use  . Smoking status: Never Smoker  . Smokeless tobacco: Never Used  Vaping Use  . Vaping Use: Never used  Substance Use Topics  . Alcohol use: Yes    Alcohol/week: 0.0 standard drinks    Comment: very Rare /Beer.wine.  . Drug use: No    Home Medications Prior to Admission medications   Medication Sig Start Date End Date Taking? Authorizing Provider  aspirin EC 81 MG tablet Take 1 tablet (81 mg total) by mouth daily. Swallow whole. 07/18/20   Danford, Suann Larry, MD  donepezil (ARICEPT) 5 MG tablet Take 1 tablet (5 mg total) by mouth at bedtime. 11/24/20   Pieter Partridge, DO  Lancets (FREESTYLE) lancets Use to check blood sugar daily-DXZ87.898 11/10/20   Unk Pinto, MD  losartan (COZAAR) 50 MG tablet Take 1 tablet (50 mg total) by mouth daily. 11/12/20   Sueanne Margarita, MD  Magnesium 250  MG TABS Take 250 mg by mouth daily.    [provider]  mirabegron ER (MYRBETRIQ) 50 MG TB24 tablet Take 1 tablet Daily for Bladder Patient taking differently: Take 50 mg by mouth daily. 01/09/20   Unk Pinto, MD  omeprazole (PRILOSEC) 20 MG capsule Take 1 capsule   2 x   /day   to Prevent Heartburn & Indigestion Patient taking differently: Take 20 mg by mouth daily. 05/18/20   Unk Pinto, MD  polyethylene glycol (MIRALAX / GLYCOLAX) 17 g packet Take 17 g by mouth daily. 07/19/20   Danford, Suann Larry, MD  potassium chloride SA (KLOR-CON) 20 MEQ tablet Take 1 tablet 2 x /day for Potassium 12/17/19   Unk Pinto, MD  pyridostigmine (MESTINON) 60 MG tablet Take 1 tablet (60 mg total) by mouth in the morning and at bedtime. 11/24/20   Sueanne Margarita, MD  rosuvastatin (CRESTOR) 5 MG tablet Take 1 tablet Daily  for Cholesterol Patient taking differently: Take 5 mg by mouth daily. 11/23/19   Unk Pinto, MD  senna-docusate (GNP STOOL SOFTENER/LAXATIVE) 8.6-50 MG tablet Take 1 tablet Daily for Constipation 09/25/20   Unk Pinto, MD  sulfamethoxazole-trimethoprim (BACTRIM DS) 800-160 MG tablet Take    1 tablet    2 x /day    with Meals    for Infection Patient not taking: Reported on 11/24/2020 11/05/20   Unk Pinto, MD  vitamin B-12 (CYANOCOBALAMIN) 500 MCG tablet Dissolve 1 tablet under tongue daily Patient taking differently: Take 500 mcg by mouth daily. Dissolve 1 tablet under tongue daily 06/29/18   Unk Pinto, MD    Allergies    Hydrocodone-acetaminophen, Sudafed [pseudoephedrine hcl], and Tetanus toxoids  Review of Systems   Review of Systems  Unable to perform ROS: Dementia   Physical Exam Updated Vital Signs BP (!) 156/97   Pulse 67   Temp (!) 97.5 F (36.4 C) (Tympanic)   Resp 16   SpO2 100%   Physical Exam Vitals and nursing note reviewed.  Constitutional:      General: He is not in acute distress.    Appearance: Normal appearance. He is normal weight. He is not ill-appearing, toxic-appearing or diaphoretic.  HENT:     Head: Normocephalic.     Comments: Erythematous abrasion noted to the posterior scalp as well as the posterior neck.    Right Ear: External ear normal.     Left Ear: External ear normal.     Nose: Nose normal.     Mouth/Throat:     Mouth: Mucous membranes are moist.     Pharynx: Oropharynx is clear. No oropharyngeal exudate or posterior oropharyngeal erythema.  Eyes:     Extraocular Movements: Extraocular movements intact.  Neck:     Comments: No tenderness appreciated with palpation of the midline spine. Cardiovascular:     Rate and Rhythm: Normal rate and regular rhythm.     Pulses: Normal pulses.     Heart sounds: Normal heart sounds. No murmur heard. No friction rub. No gallop.      Comments: Regular rate and rhythm without murmurs,  rubs, or gallops. Pulmonary:     Effort: Pulmonary effort is normal. No respiratory distress.     Breath sounds: Normal breath sounds. No stridor. No wheezing, rhonchi or rales.  Abdominal:     General: Abdomen is flat.     Tenderness: There is no abdominal tenderness.  Musculoskeletal:        General: Normal range of motion.  Cervical back: Normal range of motion and neck supple. No tenderness.  Skin:    General: Skin is warm and dry.  Neurological:     General: No focal deficit present.     Mental Status: He is alert. Mental status is at baseline.     Comments: History of dementia.  Answers questions when asked.  Follows commands.  Moving all 4 extremities without difficulty.  No gross deficits.  No facial droop.  Psychiatric:        Mood and Affect: Mood normal.        Behavior: Behavior normal.    ED Results / Procedures / Treatments   Labs (all labs ordered are listed, but only abnormal results are displayed) Labs Reviewed - No data to display  EKG None  Radiology CT Head Wo Contrast  Result Date: 12/01/2020 CLINICAL DATA:  [place], hit head, dementia EXAM: CT HEAD WITHOUT CONTRAST TECHNIQUE: Contiguous axial images were obtained from the base of the skull through the vertex without intravenous contrast. COMPARISON:  07/27/2020 FINDINGS: Brain: Chronic ischemic changes are seen throughout the periventricular white matter, thalami, and bilateral basal ganglia. No acute infarct or hemorrhage. Lateral ventricles and remaining midline structures are unremarkable. No acute extra-axial fluid collections. No mass effect. Vascular: No hyperdense vessel or unexpected calcification. Skull: Normal. Negative for fracture or focal lesion. Sinuses/Orbits: No acute finding. Other: None. IMPRESSION: 1. Stable chronic ischemic changes.  No acute intracranial process. Electronically Signed   By: Randa Ngo M.D.   On: 12/01/2020 20:01   CT Cervical Spine Wo Contrast  Result Date:  12/01/2020 CLINICAL DATA:  [place], hit back of head on door EXAM: CT CERVICAL SPINE WITHOUT CONTRAST TECHNIQUE: Multidetector CT imaging of the cervical spine was performed without intravenous contrast. Multiplanar CT image reconstructions were also generated. COMPARISON:  07/26/2020 FINDINGS: Alignment: Stable mild anterolisthesis of C4 relative to C5. Otherwise alignment is anatomic. Skull base and vertebrae: No acute fracture. No primary bone lesion or focal pathologic process. Soft tissues and spinal canal: No prevertebral fluid or swelling. No visible canal hematoma. Disc levels: Stable spondylosis at C5-6 and C6-7. Stable symmetrical neural foraminal encroachment at those levels. Upper chest: Airway is patent.  Lung apices are clear. Other: Reconstructed images demonstrate no additional findings. IMPRESSION: 1. Stable spondylosis.  No acute cervical spine fracture. Electronically Signed   By: Randa Ngo M.D.   On: 12/01/2020 20:04    Procedures Procedures   Medications Ordered in ED Medications - No data to display  ED Course  I have reviewed the triage vital signs and the nursing notes.  Pertinent labs & imaging results that were available during my care of the patient were reviewed by me and considered in my medical decision making (see chart for details).    MDM Rules/Calculators/A&P                          Pt is a 75 y.o. male who presents the emergency department with his wife due to a fall.  Patient fell backwards last night while ambulating in the house and struck his posterior head on a door.  Imaging: CT scan of the cervical spine shows stable spondylosis.  No acute cervical spine fracture. CT scan of the head shows stable chronic ischemic changes without acute intracranial process.  I, Rayna Sexton, PA-C, personally reviewed and evaluated these images and lab results as part of my medical decision-making.  Patient had reassuring imaging of the  head and neck as noted  above.  Discussed this with his wife in length.  She seems quite reassured.  She states he has behaving at baseline.  Moving all 4 extremities with ease.  Answers questions and follows commands when asked.  Pleasant to converse with.  Feel that he is stable for discharge at this time and his wife is agreeable.  Discussed return precautions.  Her questions were answered and she was amicable at the time of discharge.  Note: Portions of this report may have been transcribed using voice recognition software. Every effort was made to ensure accuracy; however, inadvertent computerized transcription errors may be present.   Final Clinical Impression(s) / ED Diagnoses Final diagnoses:  Fall, initial encounter  Injury of head, initial encounter   Rx / DC Orders ED Discharge Orders    None       Rayna Sexton, PA-C 12/01/20 2037    Tegeler, Gwenyth Allegra, MD 12/02/20 [phone]

## 2020-12-01 NOTE — ED Notes (Signed)
ED Provider at bedside. 

## 2020-12-01 NOTE — ED Triage Notes (Signed)
Per wife-pt fell ~3am-landed on back-pain to back of head entire back-pt to triage-pt nonverbal in triage-wife states is his baseline/hx dementia-NAD-to triage in own w/c

## 2020-12-01 NOTE — ED Notes (Signed)
Family and Patient verbalizes understanding of discharge instructions. Opportunity for questioning and answers were provided. Armband removed by staff, pt discharged from ED in wheelchair to home with family.

## 2020-12-02 ENCOUNTER — Encounter: Payer: Self-pay | Admitting: Cardiology

## 2020-12-02 ENCOUNTER — Encounter (INDEPENDENT_AMBULATORY_CARE_PROVIDER_SITE_OTHER): Payer: Medicare HMO

## 2020-12-02 DIAGNOSIS — R9431 Abnormal electrocardiogram [ECG] [EKG]: Secondary | ICD-10-CM | POA: Diagnosis not present

## 2020-12-02 DIAGNOSIS — R55 Syncope and collapse: Secondary | ICD-10-CM | POA: Diagnosis not present

## 2020-12-03 DIAGNOSIS — E1122 Type 2 diabetes mellitus with diabetic chronic kidney disease: Secondary | ICD-10-CM | POA: Diagnosis not present

## 2020-12-03 DIAGNOSIS — I69354 Hemiplegia and hemiparesis following cerebral infarction affecting left non-dominant side: Secondary | ICD-10-CM | POA: Diagnosis not present

## 2020-12-03 DIAGNOSIS — E559 Vitamin D deficiency, unspecified: Secondary | ICD-10-CM | POA: Diagnosis not present

## 2020-12-03 DIAGNOSIS — H903 Sensorineural hearing loss, bilateral: Secondary | ICD-10-CM | POA: Diagnosis not present

## 2020-12-03 DIAGNOSIS — I951 Orthostatic hypotension: Secondary | ICD-10-CM | POA: Diagnosis not present

## 2020-12-03 DIAGNOSIS — E785 Hyperlipidemia, unspecified: Secondary | ICD-10-CM | POA: Diagnosis not present

## 2020-12-03 DIAGNOSIS — I129 Hypertensive chronic kidney disease with stage 1 through stage 4 chronic kidney disease, or unspecified chronic kidney disease: Secondary | ICD-10-CM | POA: Diagnosis not present

## 2020-12-03 DIAGNOSIS — N183 Chronic kidney disease, stage 3 unspecified: Secondary | ICD-10-CM | POA: Diagnosis not present

## 2020-12-03 DIAGNOSIS — E876 Hypokalemia: Secondary | ICD-10-CM | POA: Diagnosis not present

## 2020-12-03 DIAGNOSIS — F0390 Unspecified dementia without behavioral disturbance: Secondary | ICD-10-CM | POA: Diagnosis not present

## 2020-12-04 DIAGNOSIS — E1122 Type 2 diabetes mellitus with diabetic chronic kidney disease: Secondary | ICD-10-CM | POA: Diagnosis not present

## 2020-12-04 DIAGNOSIS — E876 Hypokalemia: Secondary | ICD-10-CM | POA: Diagnosis not present

## 2020-12-04 DIAGNOSIS — I69354 Hemiplegia and hemiparesis following cerebral infarction affecting left non-dominant side: Secondary | ICD-10-CM | POA: Diagnosis not present

## 2020-12-04 DIAGNOSIS — E559 Vitamin D deficiency, unspecified: Secondary | ICD-10-CM | POA: Diagnosis not present

## 2020-12-04 DIAGNOSIS — I951 Orthostatic hypotension: Secondary | ICD-10-CM | POA: Diagnosis not present

## 2020-12-04 DIAGNOSIS — I129 Hypertensive chronic kidney disease with stage 1 through stage 4 chronic kidney disease, or unspecified chronic kidney disease: Secondary | ICD-10-CM | POA: Diagnosis not present

## 2020-12-04 DIAGNOSIS — E785 Hyperlipidemia, unspecified: Secondary | ICD-10-CM | POA: Diagnosis not present

## 2020-12-04 DIAGNOSIS — N183 Chronic kidney disease, stage 3 unspecified: Secondary | ICD-10-CM | POA: Diagnosis not present

## 2020-12-04 DIAGNOSIS — F0390 Unspecified dementia without behavioral disturbance: Secondary | ICD-10-CM | POA: Diagnosis not present

## 2020-12-05 DIAGNOSIS — N183 Chronic kidney disease, stage 3 unspecified: Secondary | ICD-10-CM | POA: Diagnosis not present

## 2020-12-05 DIAGNOSIS — I129 Hypertensive chronic kidney disease with stage 1 through stage 4 chronic kidney disease, or unspecified chronic kidney disease: Secondary | ICD-10-CM | POA: Diagnosis not present

## 2020-12-05 DIAGNOSIS — E785 Hyperlipidemia, unspecified: Secondary | ICD-10-CM | POA: Diagnosis not present

## 2020-12-05 DIAGNOSIS — I951 Orthostatic hypotension: Secondary | ICD-10-CM | POA: Diagnosis not present

## 2020-12-05 DIAGNOSIS — F0390 Unspecified dementia without behavioral disturbance: Secondary | ICD-10-CM | POA: Diagnosis not present

## 2020-12-05 DIAGNOSIS — E1122 Type 2 diabetes mellitus with diabetic chronic kidney disease: Secondary | ICD-10-CM | POA: Diagnosis not present

## 2020-12-05 DIAGNOSIS — E559 Vitamin D deficiency, unspecified: Secondary | ICD-10-CM | POA: Diagnosis not present

## 2020-12-05 DIAGNOSIS — I69354 Hemiplegia and hemiparesis following cerebral infarction affecting left non-dominant side: Secondary | ICD-10-CM | POA: Diagnosis not present

## 2020-12-05 DIAGNOSIS — E876 Hypokalemia: Secondary | ICD-10-CM | POA: Diagnosis not present

## 2020-12-08 ENCOUNTER — Telehealth: Payer: Self-pay | Admitting: Cardiology

## 2020-12-08 DIAGNOSIS — E876 Hypokalemia: Secondary | ICD-10-CM | POA: Diagnosis not present

## 2020-12-08 DIAGNOSIS — F0390 Unspecified dementia without behavioral disturbance: Secondary | ICD-10-CM | POA: Diagnosis not present

## 2020-12-08 DIAGNOSIS — N183 Chronic kidney disease, stage 3 unspecified: Secondary | ICD-10-CM | POA: Diagnosis not present

## 2020-12-08 DIAGNOSIS — I951 Orthostatic hypotension: Secondary | ICD-10-CM | POA: Diagnosis not present

## 2020-12-08 DIAGNOSIS — E559 Vitamin D deficiency, unspecified: Secondary | ICD-10-CM | POA: Diagnosis not present

## 2020-12-08 DIAGNOSIS — E1122 Type 2 diabetes mellitus with diabetic chronic kidney disease: Secondary | ICD-10-CM | POA: Diagnosis not present

## 2020-12-08 DIAGNOSIS — I129 Hypertensive chronic kidney disease with stage 1 through stage 4 chronic kidney disease, or unspecified chronic kidney disease: Secondary | ICD-10-CM | POA: Diagnosis not present

## 2020-12-08 DIAGNOSIS — I69354 Hemiplegia and hemiparesis following cerebral infarction affecting left non-dominant side: Secondary | ICD-10-CM | POA: Diagnosis not present

## 2020-12-08 DIAGNOSIS — E785 Hyperlipidemia, unspecified: Secondary | ICD-10-CM | POA: Diagnosis not present

## 2020-12-08 NOTE — Telephone Encounter (Signed)
Patient's wife requesting the patient's appointment 12/11/2020 to be virtual. She states they were told to call the office if they wanted to change it to virtual.

## 2020-12-08 NOTE — Telephone Encounter (Signed)
Called patient's wife (DPR) and informed her that we could change visit to [facility] visit. Will send instructions through [place].

## 2020-12-09 DIAGNOSIS — E785 Hyperlipidemia, unspecified: Secondary | ICD-10-CM | POA: Diagnosis not present

## 2020-12-09 DIAGNOSIS — I951 Orthostatic hypotension: Secondary | ICD-10-CM | POA: Diagnosis not present

## 2020-12-09 DIAGNOSIS — E876 Hypokalemia: Secondary | ICD-10-CM | POA: Diagnosis not present

## 2020-12-09 DIAGNOSIS — N183 Chronic kidney disease, stage 3 unspecified: Secondary | ICD-10-CM | POA: Diagnosis not present

## 2020-12-09 DIAGNOSIS — E559 Vitamin D deficiency, unspecified: Secondary | ICD-10-CM | POA: Diagnosis not present

## 2020-12-09 DIAGNOSIS — I129 Hypertensive chronic kidney disease with stage 1 through stage 4 chronic kidney disease, or unspecified chronic kidney disease: Secondary | ICD-10-CM | POA: Diagnosis not present

## 2020-12-09 DIAGNOSIS — I69354 Hemiplegia and hemiparesis following cerebral infarction affecting left non-dominant side: Secondary | ICD-10-CM | POA: Diagnosis not present

## 2020-12-09 DIAGNOSIS — F0390 Unspecified dementia without behavioral disturbance: Secondary | ICD-10-CM | POA: Diagnosis not present

## 2020-12-09 DIAGNOSIS — E1122 Type 2 diabetes mellitus with diabetic chronic kidney disease: Secondary | ICD-10-CM | POA: Diagnosis not present

## 2020-12-10 ENCOUNTER — Encounter: Payer: Self-pay | Admitting: Internal Medicine

## 2020-12-10 ENCOUNTER — Telehealth: Payer: Self-pay | Admitting: Cardiology

## 2020-12-10 NOTE — Telephone Encounter (Signed)
Spoke with Mathew Miller and advised her that medication changes have been received.

## 2020-12-10 NOTE — Telephone Encounter (Signed)
Mathew Miller with [facility] states she faxed medication changes to the office on 11/03/20 and again, this morning. She would like to ensure that the fax has been received. Please return call to [place] at [phone].

## 2020-12-11 ENCOUNTER — Telehealth (INDEPENDENT_AMBULATORY_CARE_PROVIDER_SITE_OTHER): Payer: Medicare HMO | Admitting: Cardiology

## 2020-12-11 ENCOUNTER — Telehealth: Payer: Self-pay | Admitting: Neurology

## 2020-12-11 ENCOUNTER — Encounter: Payer: Self-pay | Admitting: Cardiology

## 2020-12-11 ENCOUNTER — Other Ambulatory Visit: Payer: Self-pay

## 2020-12-11 VITALS — BP 134/79 | HR 76 | Ht 74.0 in

## 2020-12-11 DIAGNOSIS — R9431 Abnormal electrocardiogram [ECG] [EKG]: Secondary | ICD-10-CM | POA: Diagnosis not present

## 2020-12-11 DIAGNOSIS — F0390 Unspecified dementia without behavioral disturbance: Secondary | ICD-10-CM | POA: Diagnosis not present

## 2020-12-11 DIAGNOSIS — I951 Orthostatic hypotension: Secondary | ICD-10-CM | POA: Diagnosis not present

## 2020-12-11 DIAGNOSIS — I129 Hypertensive chronic kidney disease with stage 1 through stage 4 chronic kidney disease, or unspecified chronic kidney disease: Secondary | ICD-10-CM | POA: Diagnosis not present

## 2020-12-11 DIAGNOSIS — E876 Hypokalemia: Secondary | ICD-10-CM | POA: Diagnosis not present

## 2020-12-11 DIAGNOSIS — R55 Syncope and collapse: Secondary | ICD-10-CM

## 2020-12-11 DIAGNOSIS — N183 Chronic kidney disease, stage 3 unspecified: Secondary | ICD-10-CM | POA: Diagnosis not present

## 2020-12-11 DIAGNOSIS — E1122 Type 2 diabetes mellitus with diabetic chronic kidney disease: Secondary | ICD-10-CM | POA: Diagnosis not present

## 2020-12-11 DIAGNOSIS — E559 Vitamin D deficiency, unspecified: Secondary | ICD-10-CM | POA: Diagnosis not present

## 2020-12-11 DIAGNOSIS — I69354 Hemiplegia and hemiparesis following cerebral infarction affecting left non-dominant side: Secondary | ICD-10-CM | POA: Diagnosis not present

## 2020-12-11 DIAGNOSIS — I10 Essential (primary) hypertension: Secondary | ICD-10-CM | POA: Diagnosis not present

## 2020-12-11 DIAGNOSIS — E785 Hyperlipidemia, unspecified: Secondary | ICD-10-CM | POA: Diagnosis not present

## 2020-12-11 MED ORDER — LOSARTAN POTASSIUM 50 MG PO TABS: 75.0000 mg | ORAL_TABLET | Freq: Every day | ORAL | 3 refills | Status: DC

## 2020-12-11 NOTE — Progress Notes (Signed)
Virtual Visit via Video Note   This visit type was conducted due to national recommendations for restrictions regarding the COVID-19 Pandemic (e.g. social distancing) in an effort to limit this patient's exposure and mitigate transmission in our community.  Due to his co-morbid illnesses, this patient is at least at moderate risk for complications without adequate follow up.  This format is felt to be most appropriate for this patient at this time.  All issues noted in this document were discussed and addressed.  A limited physical exam was performed with this format.  Please refer to the patient's chart for his consent to telehealth for [facility]  Date:  12/11/2020   ID:  [name], DOB 17-Nov-1945, MRN [id number] The patient was identified using 2 identifiers.  Patient Location: Home Provider Location: Home Office  PCP:  Unk Pinto, MD  Cardiologist:  Fransico Him, MD  Electrophysiologist:  None   Evaluation Performed:  Follow-Up Visit  Chief Complaint:  Orthostatic hypotension and Syncope  History of Present Illness:    Mathew Miller. is a 75 y.o. male with a hx of HTN, HLD, Vit D def and vascular dementia.  He has a hx of ischemic frontal lobe CVA in Sept 2021 and was in SNF for a month.  He has has issues with HTN as well as orthostatic hypotension.  He was on midodrine and Florinef which had to be stopped due to HTN and Hydralazine was added to help with his elevated BP. Recently he has had more problems with postural hypotension and his Florinef was restarted by his PCP.   He was seen in initial evaluation by me for syncope and multiple episodes of orthostatic hypotension on 10/29/2020 and was markedly orthostatic with a drop in SBP from 141 to 53mmHg but has low as 66mmHg at home per his wife with syncope.  2D echo was normal in Sept 2021.  At that OV his amlodipine, Hydralazine and Seroquel were stopped.  His florinef was also stopped due to problems with  supine HTN.  He was started on Losartan 25mg  daily.  He was started on Mestinon 30mg  BID and TED hose changed to thigh high.  At that time I was very concerned that he could have a neurodegenerative disorder given recent development of severe dysautonomia and rapidly progressive dementia and I referred him to Neuro.    When I saw him last he had continued to have numerous episodes of syncope associated with marked drops in BP and had had 3 episodes of syncope.  He was seen by Neuro and felt to possibly have multiple system atrophy with eye tracking, bradykinesia, cogwheel rigidity and cognitive decline but given his predominant autonomic dysfunction out of proportion to other systems this was felt not to be due to Parkinson's or supranuclear palsy.  He was started on Donepezil 5mg  daily with increase to 10mg  daily 1 month later.    He is here today for followup and is doing well.  His wife provided the history.  She says that his BP gets as high as 160'F systolic and usually is around 093'A systolic. He has not had any drastic drops in his BP.  The lowest SBP in the 50's was but his wife forgot to give him his medication. He denies any chest pain or pressure, SOB, DOE, PND, orthopnea, LE edema, palpitations or syncope. He is compliant with his meds and is tolerating meds with no SE.    The patient does not  have symptoms concerning for COVID-19 infection (fever, chills, cough, or new shortness of breath).   Past Medical History:  Diagnosis Date  . Constipation   . Hyperlipidemia   . Hypertension   . Prediabetes    no meds   . Vitamin D deficiency    Past Surgical History:  Procedure Laterality Date  . CATARACT EXTRACTION, BILATERAL  2017  . COLONOSCOPY    . NO PAST SURGERIES       Current Meds  Medication Sig  . aspirin EC 81 MG tablet Take 1 tablet (81 mg total) by mouth daily. Swallow whole.  . donepezil (ARICEPT) 5 MG tablet Take 1 tablet (5 mg total) by mouth at bedtime.  . Lancets  (FREESTYLE) lancets Use to check blood sugar daily-DXZ87.898  . losartan (COZAAR) 50 MG tablet Take 1 tablet (50 mg total) by mouth daily.  . Magnesium 250 MG TABS Take 250 mg by mouth daily.  Marland Kitchen omeprazole (PRILOSEC) 20 MG capsule Take 1 capsule   2 x   /day   to Prevent Heartburn & Indigestion (Patient taking differently: Take 1 capsule   2 x   /day   to Prevent Heartburn & Indigestion)  . polyethylene glycol (MIRALAX / GLYCOLAX) 17 g packet Take 17 g by mouth daily.  . potassium chloride SA (KLOR-CON) 20 MEQ tablet Take 1 tablet 2 x /day for Potassium  . pyridostigmine (MESTINON) 60 MG tablet Take 1 tablet (60 mg total) by mouth in the morning and at bedtime.  . rosuvastatin (CRESTOR) 5 MG tablet Take 1 tablet Daily for Cholesterol  . senna-docusate (GNP STOOL SOFTENER/LAXATIVE) 8.6-50 MG tablet Take 1 tablet Daily for Constipation  . vitamin B-12 (CYANOCOBALAMIN) 500 MCG tablet Dissolve 1 tablet under tongue daily     Allergies:   Hydrocodone-acetaminophen, Sudafed [pseudoephedrine hcl], and Tetanus toxoids   Social History   Tobacco Use  . Smoking status: Never Smoker  . Smokeless tobacco: Never Used  Vaping Use  . Vaping Use: Never used  Substance Use Topics  . Alcohol use: Yes    Alcohol/week: 0.0 standard drinks    Comment: very Rare /Beer.wine.  . Drug use: No     Family Hx: The patient's family history includes Glaucoma in his mother; Heart disease in his father; Hyperlipidemia in his mother; Hypertension in his father. There is no history of Colon cancer, Esophageal cancer, Pancreatic cancer, Prostate cancer, Rectal cancer, or Stomach cancer.  ROS:   Please see the history of present illness.     All other systems reviewed and are negative.   Prior CV studies:   The following studies were reviewed today:  none  Labs/Other Tests and Data Reviewed:    EKG:  No ECG reviewed.  Recent Labs: 09/05/2020: Magnesium 2.1; TSH 4.02 10/22/2020: ALT 13; Hemoglobin 12.8;  Platelets 177 11/20/2020: BUN 25; Creatinine, Ser 1.40; Potassium 4.1; Sodium 141   Recent Lipid Panel Lab Results  Component Value Date/Time   CHOL 130 07/16/2020 03:40 AM   TRIG 65 07/16/2020 03:40 AM   HDL 48 07/16/2020 03:40 AM   CHOLHDL 2.7 07/16/2020 03:40 AM   LDLCALC 69 07/16/2020 03:40 AM   LDLCALC 65 03/27/2020 12:48 PM    Wt Readings from Last 3 Encounters:  11/24/20 160 lb (72.6 kg)  11/24/20 160 lb (72.6 kg)  10/29/20 160 lb (72.6 kg)     Risk Assessment/Calculations:      Objective:    Vital Signs:  BP 134/79   Pulse 76  Ht 6\' 2"  (1.88 m)   BMI 20.54 kg/m   Well nourished, well developed male in no acute distress. Well appearing, alert and conversant, regular work of breathing,  good skin color  Eyes- anicteric mouth- oral mucosa is pink  neuro- grossly intact skin- no apparent rash or lesions or cyanosis   ASSESSMENT & PLAN:    1.  Orthostatic hypotension -he has had multiple syncopal episodes with sitting and standing and profound orthostatic hypotension -2D echo normal in Sept 2021 -several drugs that would potentiate orthostasis including amlodipine (peripheral vasodilator), hydralazine (alpha blocker) and Seroquel and these were all stopped -he has not had as many drops in BP as before on  Higher dose of Mestinon but BP is still high occasionally in the afternoon as high as 563'J systolic -need to avoid proamatine and florinef due to propensity to exacerbate supine HTN -continue Mestinon 60mg  BID  -continue thigh high compression hose and abdominal binder -Seen by neuro and felt to have multisystem atrophy and started on Donepezil  2.  HTN -he has had wide fluctuations in his BP and still has BP elevations as high as 180's on Losartan 50mg  daily and  BPs ranges from 497'W to 263'Z systolic -increase Losartan to 75mg  daily  -I have asked her to check his BP BID for a week and call with results -followup with me in 2 weeks virtual  3.   Syncope -he has had multiple syncopal episodes when standing and sitting -he has passed out just sitting in the car seat -I suspect that this is all related to his orthostatic hypotension -he wore a heart monitor for 4 days in 2020 showing PACs and PVCs as well as a short run of SVT -currently wearing a heart  Monitor again -treat for dysautonomia as per #1  4.  Prolonged QT -his QTc is prolonged in Oct and likely related to Seroquel which has been stopped -seroquel stopped    COVID-19 Education: The signs and symptoms of COVID-19 were discussed with the patient and how to seek care for testing (follow up with PCP or arrange E-visit).  The importance of social distancing was discussed today.  Time:   Today, I have spent 20 minutes with the patient with telehealth technology discussing the above problems.     Medication Adjustments/Labs and Tests Ordered: Current medicines are reviewed at length with the patient today.  Concerns regarding medicines are outlined above.   Tests Ordered: No orders of the defined types were placed in this encounter.   Medication Changes: No orders of the defined types were placed in this encounter.   Follow Up:  In Person in 2 week(s)  Signed, Fransico Him, MD  12/11/2020 11:42 AM    [place]

## 2020-12-11 NOTE — Telephone Encounter (Signed)
Telephone call to pt Wife, Per the Wife is doing great on the 5mg  donepezil. She wants to know if it okay to go ahead and send the 10 mg to the pharmacy since it has to be mailed out to them.    Pt wife also wanted to know what we can do for Mr.Dengel and his insomnia.  Per the wife the other night the pt was non stop it was hard to get him to calm down.   Please advise

## 2020-12-11 NOTE — Addendum Note (Signed)
Addended by: Para March on: 12/11/2020 11:51 AM   Modules accepted: Orders

## 2020-12-11 NOTE — Telephone Encounter (Signed)
Patient's wife called in stating the patient is doing well on the donepezil and they would like to increase the dosage. The prescription can be sent to the [facility]. She also wants to talk about "sun downers". The patient may need to change medications for it.

## 2020-12-11 NOTE — Patient Instructions (Addendum)
CHECK BLOOD PRESSURE TWICE A DAY FOR 1 WEEK  Medication Instructions:  Your physician has recommended you make the following change in your medication:   INCREASE Losartan to 75mg  daily.    *If you need a refill on your cardiac medications before your next appointment, please call your pharmacy*   Lab Work: none If you have labs (blood work) drawn today and your tests are completely normal, you will receive your results only by: Marland Kitchen MyChart Message (if you have MyChart) OR . A paper copy in the mail If you have any lab test that is abnormal or we need to change your treatment, we will call you to review the results.   Testing/Procedures: none   Follow-Up: At [facility], you and your health needs are our priority.  As part of our continuing mission to provide you with exceptional heart care, we have created designated Provider Care Teams.  These Care Teams include your primary Cardiologist (physician) and Advanced Practice Providers (APPs -  Physician Assistants and Nurse Practitioners) who all work together to provide you with the care you need, when you need it.  Your next appointment:    12/26/20 at 9:00am The format for your next appointment:   Virtual Visit   Provider:   Fransico Him, MD

## 2020-12-12 DIAGNOSIS — E1122 Type 2 diabetes mellitus with diabetic chronic kidney disease: Secondary | ICD-10-CM | POA: Diagnosis not present

## 2020-12-12 DIAGNOSIS — E785 Hyperlipidemia, unspecified: Secondary | ICD-10-CM | POA: Diagnosis not present

## 2020-12-12 DIAGNOSIS — I129 Hypertensive chronic kidney disease with stage 1 through stage 4 chronic kidney disease, or unspecified chronic kidney disease: Secondary | ICD-10-CM | POA: Diagnosis not present

## 2020-12-12 DIAGNOSIS — I951 Orthostatic hypotension: Secondary | ICD-10-CM | POA: Diagnosis not present

## 2020-12-12 DIAGNOSIS — N183 Chronic kidney disease, stage 3 unspecified: Secondary | ICD-10-CM | POA: Diagnosis not present

## 2020-12-12 DIAGNOSIS — E559 Vitamin D deficiency, unspecified: Secondary | ICD-10-CM | POA: Diagnosis not present

## 2020-12-12 DIAGNOSIS — F0390 Unspecified dementia without behavioral disturbance: Secondary | ICD-10-CM | POA: Diagnosis not present

## 2020-12-12 DIAGNOSIS — I69354 Hemiplegia and hemiparesis following cerebral infarction affecting left non-dominant side: Secondary | ICD-10-CM | POA: Diagnosis not present

## 2020-12-12 DIAGNOSIS — E876 Hypokalemia: Secondary | ICD-10-CM | POA: Diagnosis not present

## 2020-12-12 MED ORDER — TRAZODONE HCL 50 MG PO TABS: 50.0000 mg | ORAL_TABLET | Freq: Every day | ORAL | 0 refills | Status: DC

## 2020-12-12 MED ORDER — DONEPEZIL HCL 5 MG PO TABS: 5.0000 mg | ORAL_TABLET | Freq: Every day | ORAL | 0 refills | Status: DC

## 2020-12-12 NOTE — Telephone Encounter (Signed)
Pt wife advised of new script Trazodone 50 mg sent to the pharmacy. Donepezil 5 mg to continue for a another month. Will send in 10 mg dose next month.

## 2020-12-12 NOTE — Telephone Encounter (Signed)
We can start trazodone 50mg  at bedtime to help him sleep at night.  However, I would delay increasing the donepezil for another month because I don't want to add/increase 2 medications at the same time.  So continue donepezil 5mg  at bedtime for now.  Please send prescription for trazodone 50mg  at bedtime and donepezil 5mg  at bedtime (we can increase to 10mg  at bedtime in one month).

## 2020-12-15 DIAGNOSIS — I129 Hypertensive chronic kidney disease with stage 1 through stage 4 chronic kidney disease, or unspecified chronic kidney disease: Secondary | ICD-10-CM | POA: Diagnosis not present

## 2020-12-15 DIAGNOSIS — I951 Orthostatic hypotension: Secondary | ICD-10-CM | POA: Diagnosis not present

## 2020-12-15 DIAGNOSIS — F0390 Unspecified dementia without behavioral disturbance: Secondary | ICD-10-CM | POA: Diagnosis not present

## 2020-12-15 DIAGNOSIS — E559 Vitamin D deficiency, unspecified: Secondary | ICD-10-CM | POA: Diagnosis not present

## 2020-12-15 DIAGNOSIS — E1122 Type 2 diabetes mellitus with diabetic chronic kidney disease: Secondary | ICD-10-CM | POA: Diagnosis not present

## 2020-12-15 DIAGNOSIS — I69354 Hemiplegia and hemiparesis following cerebral infarction affecting left non-dominant side: Secondary | ICD-10-CM | POA: Diagnosis not present

## 2020-12-15 DIAGNOSIS — E876 Hypokalemia: Secondary | ICD-10-CM | POA: Diagnosis not present

## 2020-12-15 DIAGNOSIS — N183 Chronic kidney disease, stage 3 unspecified: Secondary | ICD-10-CM | POA: Diagnosis not present

## 2020-12-15 DIAGNOSIS — E785 Hyperlipidemia, unspecified: Secondary | ICD-10-CM | POA: Diagnosis not present

## 2020-12-16 ENCOUNTER — Telehealth: Payer: Self-pay | Admitting: *Deleted

## 2020-12-16 ENCOUNTER — Other Ambulatory Visit: Payer: Self-pay | Admitting: Neurology

## 2020-12-16 ENCOUNTER — Telehealth: Payer: Self-pay | Admitting: Cardiology

## 2020-12-16 MED ORDER — PYRIDOSTIGMINE BROMIDE 60 MG PO TABS: 60.0000 mg | ORAL_TABLET | Freq: Two times a day (BID) | ORAL | 0 refills | Status: DC

## 2020-12-16 MED ORDER — PYRIDOSTIGMINE BROMIDE 60 MG PO TABS: 60.0000 mg | ORAL_TABLET | Freq: Two times a day (BID) | ORAL | 3 refills | Status: DC

## 2020-12-16 NOTE — Telephone Encounter (Signed)
Patient has been dx with multisystem atrophy for which orthostatic hypotension is a profound symptom.  EKG did not show any arrhythmias.  He is already on Mestinon and cannot add proamatine due to elevated BP. Please get back in with Neuro for help

## 2020-12-16 NOTE — Telephone Encounter (Signed)
[employer] called in to report critical monitor finding --  Pt reported that they passed out. [employer] states reading shows NSR, HR 90s.  They will fax report over for our review.

## 2020-12-16 NOTE — Telephone Encounter (Signed)
The patient states that [facility] did not get the increased medication change that was sent in after the last visit for Pyridostigmine. Wife has called them because it hasn't come yet. Only has a week supply left. Wants some called in locally so they don't run out and the rest for the year re sent to [facility]

## 2020-12-16 NOTE — Telephone Encounter (Addendum)
[employer] cardiac report: 12/16/2020 at 9:17 am  Pt Trigger, passed out  Report Sinus Rhythm with [employer]  Speaking to patients wife; he was using his walker to get into the restroom. He had just got into the restroom and passed out. Wife states he was out 3 to 4 minutes. Vital sign about 30 minutes before episode 122/80 HR 68. Patient did not express any feelings of presyncope. No involuntary voiding or BM. Patient acting normal after waking up.  Of note patient has dementia  Report taken to Dr. Radford Pax for review. No arrhythmia noted. Syncope likely related to his orthostatic hypotension. Recommended to call neurologist. Placed to be scanned into epic.

## 2020-12-16 NOTE — Telephone Encounter (Signed)
    Mathew Miller with [employer] calling to report critical event monitor result

## 2020-12-16 NOTE — Telephone Encounter (Signed)
Refill has been sent to local pharmacy. Will send future refills through [facility].

## 2020-12-17 ENCOUNTER — Telehealth: Payer: Self-pay | Admitting: Cardiology

## 2020-12-17 ENCOUNTER — Telehealth: Payer: Self-pay | Admitting: Internal Medicine

## 2020-12-17 ENCOUNTER — Telehealth: Payer: Self-pay

## 2020-12-17 DIAGNOSIS — E876 Hypokalemia: Secondary | ICD-10-CM | POA: Diagnosis not present

## 2020-12-17 DIAGNOSIS — F0390 Unspecified dementia without behavioral disturbance: Secondary | ICD-10-CM | POA: Diagnosis not present

## 2020-12-17 DIAGNOSIS — I69354 Hemiplegia and hemiparesis following cerebral infarction affecting left non-dominant side: Secondary | ICD-10-CM | POA: Diagnosis not present

## 2020-12-17 DIAGNOSIS — R55 Syncope and collapse: Secondary | ICD-10-CM

## 2020-12-17 DIAGNOSIS — E559 Vitamin D deficiency, unspecified: Secondary | ICD-10-CM | POA: Diagnosis not present

## 2020-12-17 DIAGNOSIS — N183 Chronic kidney disease, stage 3 unspecified: Secondary | ICD-10-CM | POA: Diagnosis not present

## 2020-12-17 DIAGNOSIS — I951 Orthostatic hypotension: Secondary | ICD-10-CM

## 2020-12-17 DIAGNOSIS — I129 Hypertensive chronic kidney disease with stage 1 through stage 4 chronic kidney disease, or unspecified chronic kidney disease: Secondary | ICD-10-CM | POA: Diagnosis not present

## 2020-12-17 DIAGNOSIS — E785 Hyperlipidemia, unspecified: Secondary | ICD-10-CM | POA: Diagnosis not present

## 2020-12-17 DIAGNOSIS — E1122 Type 2 diabetes mellitus with diabetic chronic kidney disease: Secondary | ICD-10-CM | POA: Diagnosis not present

## 2020-12-17 NOTE — Telephone Encounter (Signed)
Spoke with the patient's wife who was concerned about him running out of Mestinon. Advised her that [facility] store should have a month supply available and that another refill will be coming through [facility].   A critical report was also received from [employer] that the patient passed out today 3/2 at 12:31pm. Report shows normal sinus rhythm. Wife reports that the patient stood up too quickly from the toiled and passed out. He did not hit his head and was not injured.   Advised patient's wife on recommendations from Dr. Radford Pax to reach out to his neurologist. Patient's wife verbalized understanding Dr. Radford Pax would also like to refer the patient to see Dr. Caryl Comes.

## 2020-12-17 NOTE — Telephone Encounter (Signed)
Left message for patient's wife to call back.  

## 2020-12-17 NOTE — Telephone Encounter (Signed)
Dr Melford Aase received the requested re evaluation for patient DME needs in home from [facility].  An order for DME Hospital bed was faxed to [place] [facility] w/ most up to date office note. Called patient's wife to advise oder was sent. She expressed appreciation for the status update on the much need DME hospital bed.

## 2020-12-17 NOTE — Telephone Encounter (Signed)
Mitch from [place] [facility] calling critical monitor report:  He states that today 12/17/20 at 11:29 AM CST the patient's monitor showed patient activated syncopal episode. Patient was in [place] with a rate of 84 at the time. No other additional information available. They will fax over strips. Will send to triage to follow up with the patient.

## 2020-12-17 NOTE — Telephone Encounter (Signed)
Pt sent a mychart message, he is requesting to get a call back from Carly  Comments: Please ask Carly asap to call me asap for Dr. Radford Pax at [phone] I have tried to call the office and held on for 35 minutes. Also tried to get through on My Chart.

## 2020-12-17 NOTE — Telephone Encounter (Signed)
RN has spoken with patient concerning this issue. See additional telephone encounter dated today 3/2.

## 2020-12-17 NOTE — Telephone Encounter (Signed)
   Pt sent a mychart message, he is requesting to get a call back from Carly  Comments: Please ask Carly asap to call me or another nurse for Dr. Radford Pax. [phone]  I have tried to call office and held on for 35 minutes and tried to get through on My Chart and  on phone after waiting it said try again later.

## 2020-12-18 DIAGNOSIS — E559 Vitamin D deficiency, unspecified: Secondary | ICD-10-CM | POA: Diagnosis not present

## 2020-12-18 DIAGNOSIS — E876 Hypokalemia: Secondary | ICD-10-CM | POA: Diagnosis not present

## 2020-12-18 DIAGNOSIS — I951 Orthostatic hypotension: Secondary | ICD-10-CM | POA: Diagnosis not present

## 2020-12-18 DIAGNOSIS — I129 Hypertensive chronic kidney disease with stage 1 through stage 4 chronic kidney disease, or unspecified chronic kidney disease: Secondary | ICD-10-CM | POA: Diagnosis not present

## 2020-12-18 DIAGNOSIS — E785 Hyperlipidemia, unspecified: Secondary | ICD-10-CM | POA: Diagnosis not present

## 2020-12-18 DIAGNOSIS — I69354 Hemiplegia and hemiparesis following cerebral infarction affecting left non-dominant side: Secondary | ICD-10-CM | POA: Diagnosis not present

## 2020-12-18 DIAGNOSIS — E1122 Type 2 diabetes mellitus with diabetic chronic kidney disease: Secondary | ICD-10-CM | POA: Diagnosis not present

## 2020-12-18 DIAGNOSIS — N183 Chronic kidney disease, stage 3 unspecified: Secondary | ICD-10-CM | POA: Diagnosis not present

## 2020-12-18 DIAGNOSIS — F0390 Unspecified dementia without behavioral disturbance: Secondary | ICD-10-CM | POA: Diagnosis not present

## 2020-12-18 NOTE — Telephone Encounter (Signed)
Colletta Maryland with [facility], formerly known at [facility], is following up regarding order for medication changes. She states it still needs to be signed and dated by Dr. Radford Pax. Colletta Maryland requested a call back for confirmation. She call be reached at [phone].

## 2020-12-18 NOTE — Telephone Encounter (Signed)
Spoke with stephanie and she needs form with patient's medication changes signed and faxed back to her at [phone].

## 2020-12-20 ENCOUNTER — Other Ambulatory Visit: Payer: Self-pay | Admitting: Internal Medicine

## 2020-12-20 DIAGNOSIS — R413 Other amnesia: Secondary | ICD-10-CM

## 2020-12-20 DIAGNOSIS — E782 Mixed hyperlipidemia: Secondary | ICD-10-CM

## 2020-12-22 DIAGNOSIS — E559 Vitamin D deficiency, unspecified: Secondary | ICD-10-CM | POA: Diagnosis not present

## 2020-12-22 DIAGNOSIS — I129 Hypertensive chronic kidney disease with stage 1 through stage 4 chronic kidney disease, or unspecified chronic kidney disease: Secondary | ICD-10-CM | POA: Diagnosis not present

## 2020-12-22 DIAGNOSIS — E876 Hypokalemia: Secondary | ICD-10-CM | POA: Diagnosis not present

## 2020-12-22 DIAGNOSIS — I951 Orthostatic hypotension: Secondary | ICD-10-CM | POA: Diagnosis not present

## 2020-12-22 DIAGNOSIS — N183 Chronic kidney disease, stage 3 unspecified: Secondary | ICD-10-CM | POA: Diagnosis not present

## 2020-12-22 DIAGNOSIS — E1122 Type 2 diabetes mellitus with diabetic chronic kidney disease: Secondary | ICD-10-CM | POA: Diagnosis not present

## 2020-12-22 DIAGNOSIS — E785 Hyperlipidemia, unspecified: Secondary | ICD-10-CM | POA: Diagnosis not present

## 2020-12-22 DIAGNOSIS — F0390 Unspecified dementia without behavioral disturbance: Secondary | ICD-10-CM | POA: Diagnosis not present

## 2020-12-22 DIAGNOSIS — I69354 Hemiplegia and hemiparesis following cerebral infarction affecting left non-dominant side: Secondary | ICD-10-CM | POA: Diagnosis not present

## 2020-12-23 DIAGNOSIS — E785 Hyperlipidemia, unspecified: Secondary | ICD-10-CM | POA: Diagnosis not present

## 2020-12-23 DIAGNOSIS — E559 Vitamin D deficiency, unspecified: Secondary | ICD-10-CM | POA: Diagnosis not present

## 2020-12-23 DIAGNOSIS — F0390 Unspecified dementia without behavioral disturbance: Secondary | ICD-10-CM | POA: Diagnosis not present

## 2020-12-23 DIAGNOSIS — I129 Hypertensive chronic kidney disease with stage 1 through stage 4 chronic kidney disease, or unspecified chronic kidney disease: Secondary | ICD-10-CM | POA: Diagnosis not present

## 2020-12-23 DIAGNOSIS — I951 Orthostatic hypotension: Secondary | ICD-10-CM | POA: Diagnosis not present

## 2020-12-23 DIAGNOSIS — N183 Chronic kidney disease, stage 3 unspecified: Secondary | ICD-10-CM | POA: Diagnosis not present

## 2020-12-23 DIAGNOSIS — E876 Hypokalemia: Secondary | ICD-10-CM | POA: Diagnosis not present

## 2020-12-23 DIAGNOSIS — E1122 Type 2 diabetes mellitus with diabetic chronic kidney disease: Secondary | ICD-10-CM | POA: Diagnosis not present

## 2020-12-23 DIAGNOSIS — I69354 Hemiplegia and hemiparesis following cerebral infarction affecting left non-dominant side: Secondary | ICD-10-CM | POA: Diagnosis not present

## 2020-12-25 DIAGNOSIS — I129 Hypertensive chronic kidney disease with stage 1 through stage 4 chronic kidney disease, or unspecified chronic kidney disease: Secondary | ICD-10-CM | POA: Diagnosis not present

## 2020-12-25 DIAGNOSIS — E876 Hypokalemia: Secondary | ICD-10-CM | POA: Diagnosis not present

## 2020-12-25 DIAGNOSIS — I951 Orthostatic hypotension: Secondary | ICD-10-CM | POA: Diagnosis not present

## 2020-12-25 DIAGNOSIS — F0390 Unspecified dementia without behavioral disturbance: Secondary | ICD-10-CM | POA: Diagnosis not present

## 2020-12-25 DIAGNOSIS — N183 Chronic kidney disease, stage 3 unspecified: Secondary | ICD-10-CM | POA: Diagnosis not present

## 2020-12-25 DIAGNOSIS — E559 Vitamin D deficiency, unspecified: Secondary | ICD-10-CM | POA: Diagnosis not present

## 2020-12-25 DIAGNOSIS — I69354 Hemiplegia and hemiparesis following cerebral infarction affecting left non-dominant side: Secondary | ICD-10-CM | POA: Diagnosis not present

## 2020-12-25 DIAGNOSIS — E785 Hyperlipidemia, unspecified: Secondary | ICD-10-CM | POA: Diagnosis not present

## 2020-12-25 DIAGNOSIS — E1122 Type 2 diabetes mellitus with diabetic chronic kidney disease: Secondary | ICD-10-CM | POA: Diagnosis not present

## 2020-12-26 ENCOUNTER — Telehealth (INDEPENDENT_AMBULATORY_CARE_PROVIDER_SITE_OTHER): Payer: Medicare HMO | Admitting: Cardiology

## 2020-12-26 VITALS — Ht 74.0 in

## 2020-12-26 DIAGNOSIS — R9431 Abnormal electrocardiogram [ECG] [EKG]: Secondary | ICD-10-CM

## 2020-12-26 DIAGNOSIS — I10 Essential (primary) hypertension: Secondary | ICD-10-CM

## 2020-12-26 DIAGNOSIS — R55 Syncope and collapse: Secondary | ICD-10-CM | POA: Diagnosis not present

## 2020-12-26 DIAGNOSIS — I951 Orthostatic hypotension: Secondary | ICD-10-CM | POA: Diagnosis not present

## 2020-12-26 NOTE — Progress Notes (Signed)
Virtual Visit via Video Note   This visit type was conducted due to national recommendations for restrictions regarding the COVID-19 Pandemic (e.g. social distancing) in an effort to limit this patient's exposure and mitigate transmission in our community.  Due to his co-morbid illnesses, this patient is at least at moderate risk for complications without adequate follow up.  This format is felt to be most appropriate for this patient at this time.  All issues noted in this document were discussed and addressed.  A limited physical exam was performed with this format.  Please refer to the patient's chart for his consent to telehealth for [facility].  Date:  12/26/2020   ID:  Mathew Phi., DOB 12-Jun-1946, MRN [id number] The patient was identified using 2 identifiers.  Patient Location: Home Provider Location: Home Office  PCP:  Unk Pinto, MD  Cardiologist:  Fransico Him, MD  Electrophysiologist:  None   Evaluation Performed:  Follow-Up Visit  Chief Complaint:  Orthostatic hypotension and Syncope  History of Present Illness:    Mathew Daft. is a 75 y.o. male with a hx of HTN, HLD, Vit D def and vascular dementia.  He has a hx of ischemic frontal lobe CVA in Sept 2021 and was in SNF for a month.  He has has issues with HTN as well as orthostatic hypotension.  He was on midodrine and Florinef which had to be stopped due to HTN and Hydralazine was added to help with his elevated BP. Recently he has had more problems with postural hypotension and his Florinef was restarted by his PCP.   He was seen in initial evaluation by me for syncope and multiple episodes of orthostatic hypotension on 10/29/2020 and was markedly orthostatic with a drop in SBP from 141 to 67mmHg but has low as 80mmHg at home per his wife with syncope.  2D echo was normal in Sept 2021.  At that OV his amlodipine, Hydralazine and Seroquel were stopped.  His florinef was also stopped due to problems with  supine HTN.  He was started on Losartan 25mg  daily.  He was started on Mestinon 30mg  BID and TED hose changed to thigh high.  At that time I was very concerned that he could have a neurodegenerative disorder given recent development of severe dysautonomia and rapidly progressive dementia and I referred him to Neuro.    When I saw him last he had continued to have numerous episodes of syncope associated with marked drops in BP and had had 3 episodes of syncope.  He was seen by Neuro and felt to possibly have multiple system atrophy with eye tracking, bradykinesia, cogwheel rigidity and cognitive decline but given his predominant autonomic dysfunction out of proportion to other systems this was felt not to be due to Parkinson's or supranuclear palsy.  He was started on Donepezil 5mg  daily with increase to 10mg  daily 1 month later.    He is here today for followup and is doing about the same.  He is still having syncopal episodes.  He is currently wearing a heart monitor which has been activated several times for syncope with no arrhythmia noted on heart monitor.  This is consistent with his profound orthostatic hypotension from his mutisystem atrophy. He is wearing his thigh high compression hose and abdominal binder all day.  He still has times when he goes from laying in bed to sitting up for a bath and passes out. He has passed outgoing to the bathroom but  sits on the commode to urinate.  His wife give most of the history today and says that most of the time he passes out is when he has been laying down and then sits up.  His wife says that he is out at the most 5 minutes at a time and may wet his pants and drool during this time.  He denies any chest pain or pressure, SOB, DOE, PND, orthopnea, LE edema, palpitations. He is compliant with his meds and is tolerating meds with no SE.    The patient does not have symptoms concerning for COVID-19 infection (fever, chills, cough, or new shortness of breath).    Past Medical History:  Diagnosis Date  . Constipation   . Hyperlipidemia   . Hypertension   . Prediabetes    no meds   . Vitamin D deficiency    Past Surgical History:  Procedure Laterality Date  . CATARACT EXTRACTION, BILATERAL  2017  . COLONOSCOPY    . NO PAST SURGERIES       No outpatient medications have been marked as taking for the 12/26/20 encounter (Appointment) with Mathew Margarita, MD.     Allergies:   Hydrocodone-acetaminophen, Sudafed [pseudoephedrine hcl], and Tetanus toxoids   Social History   Tobacco Use  . Smoking status: Never Smoker  . Smokeless tobacco: Never Used  Vaping Use  . Vaping Use: Never used  Substance Use Topics  . Alcohol use: Yes    Alcohol/week: 0.0 standard drinks    Comment: very Rare /Beer.wine.  . Drug use: No     Family Hx: The patient's family history includes Glaucoma in his mother; Heart disease in his father; Hyperlipidemia in his mother; Hypertension in his father. There is no history of Colon cancer, Esophageal cancer, Pancreatic cancer, Prostate cancer, Rectal cancer, or Stomach cancer.  ROS:   Please see the history of present illness.     All other systems reviewed and are negative.   Prior CV studies:   The following studies were reviewed today:  none  Labs/Other Tests and Data Reviewed:    EKG:  No ECG reviewed.  Recent Labs: 09/05/2020: Magnesium 2.1; TSH 4.02 10/22/2020: ALT 13; Hemoglobin 12.8; Platelets 177 11/20/2020: BUN 25; Creatinine, Ser 1.40; Potassium 4.1; Sodium 141   Recent Lipid Panel Lab Results  Component Value Date/Time   CHOL 130 07/16/2020 03:40 AM   TRIG 65 07/16/2020 03:40 AM   HDL 48 07/16/2020 03:40 AM   CHOLHDL 2.7 07/16/2020 03:40 AM   LDLCALC 69 07/16/2020 03:40 AM   LDLCALC 65 03/27/2020 12:48 PM    Wt Readings from Last 3 Encounters:  11/24/20 160 lb (72.6 kg)  11/24/20 160 lb (72.6 kg)  10/29/20 160 lb (72.6 kg)     Risk Assessment/Calculations:      Objective:     Vital Signs:  There were no vitals taken for this visit.  Well nourished, well developed male in no acute distress. Well appearing, alert and conversant, regular work of breathing,  good skin color  Eyes- anicteric mouth- oral mucosa is pink  neuro- grossly intact skin- no apparent rash or lesions or cyanosis   ASSESSMENT & PLAN:    1.  Orthostatic hypotension/Syncope -he has had multiple syncopal episodes with sitting and standing and profound orthostatic hypotension -2D echo normal in Sept 2021 -several drugs that would potentiate orthostasis including amlodipine (peripheral vasodilator), hydralazine (alpha blocker) and Seroquel and these were all stopped -I referred him to Neuro as I was  concerned he may have a neurodegenerative disorder and he was dx with multisystem atrophy with profound autonomic dysfunction -he has had multiple episodes of syncope with sitting or standing  -He has wide fluctuations in BP with some readings as high as 449-675'F systolic which has limited use of proamatine or florinef -started on Mestinon and not up to 60mg  BID -his BPs have been better recently with only occasional high readings -continue Mestinon 60mg  BID  -continue thigh high compression hose and abdominal binder -Encouraged his wife to make sure he is always sitting to go to the bathroom -His wife is getting him a hospital bed and I encouraged her to slowing increase the head of the bed before getting him completely up to bathe and use the commode -Seen by neuro and felt to have multisystem atrophy and started on Donepezil -I have referred him to Dr. Caryl Comes to see if he has anything else to add  2.  HTN -he has had wide fluctuations in his BP and still has BP elevations as high as 180's on Losartan 50mg  daily and  BPs ranges from 163'W to 466'Z systolic -I increased Losartan to 75mg  daily which seems to have improved his BP some  3.  Prolonged QT -his QTc is prolonged in Oct and likely  related to Seroquel which has been stopped -seroquel stopped -he was placed on trazodone for sleep which has not helped at all and he has been up nightly throughout the night -I recommended stopping this if it is not helping to avoid any problems with QT prolongation   COVID-19 Education: The signs and symptoms of COVID-19 were discussed with the patient and how to seek care for testing (follow up with PCP or arrange E-visit).  The importance of social distancing was discussed today.  Time:   Today, I have spent 20 minutes with the patient with telehealth technology discussing the above problems.     Medication Adjustments/Labs and Tests Ordered: Current medicines are reviewed at length with the patient today.  Concerns regarding medicines are outlined above.   Tests Ordered: No orders of the defined types were placed in this encounter.   Medication Changes: No orders of the defined types were placed in this encounter.   Follow LD:JTTSV in 4 weeks  Signed, Fransico Him, MD  12/26/2020 8:43 AM    [place]

## 2020-12-29 ENCOUNTER — Other Ambulatory Visit: Payer: Self-pay

## 2020-12-29 ENCOUNTER — Telehealth: Payer: Self-pay | Admitting: Cardiology

## 2020-12-29 DIAGNOSIS — I69354 Hemiplegia and hemiparesis following cerebral infarction affecting left non-dominant side: Secondary | ICD-10-CM | POA: Diagnosis not present

## 2020-12-29 DIAGNOSIS — I951 Orthostatic hypotension: Secondary | ICD-10-CM | POA: Diagnosis not present

## 2020-12-29 DIAGNOSIS — N183 Chronic kidney disease, stage 3 unspecified: Secondary | ICD-10-CM | POA: Diagnosis not present

## 2020-12-29 DIAGNOSIS — I129 Hypertensive chronic kidney disease with stage 1 through stage 4 chronic kidney disease, or unspecified chronic kidney disease: Secondary | ICD-10-CM | POA: Diagnosis not present

## 2020-12-29 DIAGNOSIS — E1122 Type 2 diabetes mellitus with diabetic chronic kidney disease: Secondary | ICD-10-CM | POA: Diagnosis not present

## 2020-12-29 DIAGNOSIS — F0390 Unspecified dementia without behavioral disturbance: Secondary | ICD-10-CM | POA: Diagnosis not present

## 2020-12-29 DIAGNOSIS — E876 Hypokalemia: Secondary | ICD-10-CM | POA: Diagnosis not present

## 2020-12-29 DIAGNOSIS — E785 Hyperlipidemia, unspecified: Secondary | ICD-10-CM | POA: Diagnosis not present

## 2020-12-29 DIAGNOSIS — E559 Vitamin D deficiency, unspecified: Secondary | ICD-10-CM | POA: Diagnosis not present

## 2020-12-29 MED ORDER — GABAPENTIN 100 MG PO CAPS: 100.0000 mg | ORAL_CAPSULE | Freq: Every day | ORAL | 0 refills | Status: DC

## 2020-12-29 NOTE — Telephone Encounter (Signed)
[place] with [facility] stated she has send over several faxes regarding patient Mathew Miller in regards to medication change. First fax she sent over was on Jan 13 with no responses and the latest that she fax was on March 11. Annett Gula called today wanting to get the issues resolve asap. Please advise

## 2020-12-29 NOTE — Progress Notes (Signed)
Per Dr.Jaffe, Gabapentin 100 mg at bedtime sent to the [facility].

## 2020-12-30 DIAGNOSIS — I129 Hypertensive chronic kidney disease with stage 1 through stage 4 chronic kidney disease, or unspecified chronic kidney disease: Secondary | ICD-10-CM | POA: Diagnosis not present

## 2020-12-30 DIAGNOSIS — I69354 Hemiplegia and hemiparesis following cerebral infarction affecting left non-dominant side: Secondary | ICD-10-CM | POA: Diagnosis not present

## 2020-12-30 DIAGNOSIS — I951 Orthostatic hypotension: Secondary | ICD-10-CM | POA: Insufficient documentation

## 2020-12-30 DIAGNOSIS — N183 Chronic kidney disease, stage 3 unspecified: Secondary | ICD-10-CM | POA: Diagnosis not present

## 2020-12-30 DIAGNOSIS — E1122 Type 2 diabetes mellitus with diabetic chronic kidney disease: Secondary | ICD-10-CM | POA: Diagnosis not present

## 2020-12-30 DIAGNOSIS — E559 Vitamin D deficiency, unspecified: Secondary | ICD-10-CM | POA: Diagnosis not present

## 2020-12-30 DIAGNOSIS — R9431 Abnormal electrocardiogram [ECG] [EKG]: Secondary | ICD-10-CM | POA: Insufficient documentation

## 2020-12-30 DIAGNOSIS — E785 Hyperlipidemia, unspecified: Secondary | ICD-10-CM | POA: Diagnosis not present

## 2020-12-30 DIAGNOSIS — F0390 Unspecified dementia without behavioral disturbance: Secondary | ICD-10-CM | POA: Diagnosis not present

## 2020-12-30 DIAGNOSIS — E876 Hypokalemia: Secondary | ICD-10-CM | POA: Diagnosis not present

## 2020-12-31 NOTE — Telephone Encounter (Signed)
Signed paperwork has been faxed.

## 2021-01-02 ENCOUNTER — Other Ambulatory Visit: Payer: Self-pay | Admitting: Internal Medicine

## 2021-01-02 ENCOUNTER — Encounter: Payer: Self-pay | Admitting: Neurology

## 2021-01-02 DIAGNOSIS — R351 Nocturia: Secondary | ICD-10-CM | POA: Diagnosis not present

## 2021-01-02 DIAGNOSIS — N401 Enlarged prostate with lower urinary tract symptoms: Secondary | ICD-10-CM | POA: Diagnosis not present

## 2021-01-05 DIAGNOSIS — I129 Hypertensive chronic kidney disease with stage 1 through stage 4 chronic kidney disease, or unspecified chronic kidney disease: Secondary | ICD-10-CM | POA: Diagnosis not present

## 2021-01-05 DIAGNOSIS — E1122 Type 2 diabetes mellitus with diabetic chronic kidney disease: Secondary | ICD-10-CM | POA: Diagnosis not present

## 2021-01-05 DIAGNOSIS — I69354 Hemiplegia and hemiparesis following cerebral infarction affecting left non-dominant side: Secondary | ICD-10-CM | POA: Diagnosis not present

## 2021-01-05 DIAGNOSIS — I951 Orthostatic hypotension: Secondary | ICD-10-CM | POA: Diagnosis not present

## 2021-01-05 DIAGNOSIS — E785 Hyperlipidemia, unspecified: Secondary | ICD-10-CM | POA: Diagnosis not present

## 2021-01-05 DIAGNOSIS — N183 Chronic kidney disease, stage 3 unspecified: Secondary | ICD-10-CM | POA: Diagnosis not present

## 2021-01-05 DIAGNOSIS — E876 Hypokalemia: Secondary | ICD-10-CM | POA: Diagnosis not present

## 2021-01-05 DIAGNOSIS — F0390 Unspecified dementia without behavioral disturbance: Secondary | ICD-10-CM | POA: Diagnosis not present

## 2021-01-05 DIAGNOSIS — E559 Vitamin D deficiency, unspecified: Secondary | ICD-10-CM | POA: Diagnosis not present

## 2021-01-07 DIAGNOSIS — E1122 Type 2 diabetes mellitus with diabetic chronic kidney disease: Secondary | ICD-10-CM | POA: Diagnosis not present

## 2021-01-07 DIAGNOSIS — E876 Hypokalemia: Secondary | ICD-10-CM | POA: Diagnosis not present

## 2021-01-07 DIAGNOSIS — E785 Hyperlipidemia, unspecified: Secondary | ICD-10-CM | POA: Diagnosis not present

## 2021-01-07 DIAGNOSIS — N183 Chronic kidney disease, stage 3 unspecified: Secondary | ICD-10-CM | POA: Diagnosis not present

## 2021-01-07 DIAGNOSIS — F0390 Unspecified dementia without behavioral disturbance: Secondary | ICD-10-CM | POA: Diagnosis not present

## 2021-01-07 DIAGNOSIS — E559 Vitamin D deficiency, unspecified: Secondary | ICD-10-CM | POA: Diagnosis not present

## 2021-01-07 DIAGNOSIS — I129 Hypertensive chronic kidney disease with stage 1 through stage 4 chronic kidney disease, or unspecified chronic kidney disease: Secondary | ICD-10-CM | POA: Diagnosis not present

## 2021-01-07 DIAGNOSIS — I69354 Hemiplegia and hemiparesis following cerebral infarction affecting left non-dominant side: Secondary | ICD-10-CM | POA: Diagnosis not present

## 2021-01-07 DIAGNOSIS — I951 Orthostatic hypotension: Secondary | ICD-10-CM | POA: Diagnosis not present

## 2021-01-08 DIAGNOSIS — E785 Hyperlipidemia, unspecified: Secondary | ICD-10-CM | POA: Diagnosis not present

## 2021-01-08 DIAGNOSIS — E1122 Type 2 diabetes mellitus with diabetic chronic kidney disease: Secondary | ICD-10-CM | POA: Diagnosis not present

## 2021-01-08 DIAGNOSIS — E559 Vitamin D deficiency, unspecified: Secondary | ICD-10-CM | POA: Diagnosis not present

## 2021-01-08 DIAGNOSIS — E876 Hypokalemia: Secondary | ICD-10-CM | POA: Diagnosis not present

## 2021-01-08 DIAGNOSIS — N183 Chronic kidney disease, stage 3 unspecified: Secondary | ICD-10-CM | POA: Diagnosis not present

## 2021-01-08 DIAGNOSIS — I951 Orthostatic hypotension: Secondary | ICD-10-CM | POA: Diagnosis not present

## 2021-01-08 DIAGNOSIS — I129 Hypertensive chronic kidney disease with stage 1 through stage 4 chronic kidney disease, or unspecified chronic kidney disease: Secondary | ICD-10-CM | POA: Diagnosis not present

## 2021-01-08 DIAGNOSIS — F0390 Unspecified dementia without behavioral disturbance: Secondary | ICD-10-CM | POA: Diagnosis not present

## 2021-01-08 DIAGNOSIS — I69354 Hemiplegia and hemiparesis following cerebral infarction affecting left non-dominant side: Secondary | ICD-10-CM | POA: Diagnosis not present

## 2021-01-09 DIAGNOSIS — N183 Chronic kidney disease, stage 3 unspecified: Secondary | ICD-10-CM | POA: Diagnosis not present

## 2021-01-09 DIAGNOSIS — F0390 Unspecified dementia without behavioral disturbance: Secondary | ICD-10-CM | POA: Diagnosis not present

## 2021-01-09 DIAGNOSIS — I129 Hypertensive chronic kidney disease with stage 1 through stage 4 chronic kidney disease, or unspecified chronic kidney disease: Secondary | ICD-10-CM | POA: Diagnosis not present

## 2021-01-09 DIAGNOSIS — E1122 Type 2 diabetes mellitus with diabetic chronic kidney disease: Secondary | ICD-10-CM | POA: Diagnosis not present

## 2021-01-09 DIAGNOSIS — I69354 Hemiplegia and hemiparesis following cerebral infarction affecting left non-dominant side: Secondary | ICD-10-CM | POA: Diagnosis not present

## 2021-01-09 DIAGNOSIS — E785 Hyperlipidemia, unspecified: Secondary | ICD-10-CM | POA: Diagnosis not present

## 2021-01-09 DIAGNOSIS — I951 Orthostatic hypotension: Secondary | ICD-10-CM | POA: Diagnosis not present

## 2021-01-09 DIAGNOSIS — E559 Vitamin D deficiency, unspecified: Secondary | ICD-10-CM | POA: Diagnosis not present

## 2021-01-09 DIAGNOSIS — E876 Hypokalemia: Secondary | ICD-10-CM | POA: Diagnosis not present

## 2021-01-12 ENCOUNTER — Ambulatory Visit: Payer: Medicare HMO | Admitting: Internal Medicine

## 2021-01-12 ENCOUNTER — Encounter: Payer: Self-pay | Admitting: Internal Medicine

## 2021-01-12 ENCOUNTER — Other Ambulatory Visit: Payer: Self-pay

## 2021-01-12 DIAGNOSIS — I951 Orthostatic hypotension: Secondary | ICD-10-CM

## 2021-01-12 DIAGNOSIS — R9431 Abnormal electrocardiogram [ECG] [EKG]: Secondary | ICD-10-CM

## 2021-01-12 NOTE — Patient Instructions (Addendum)
Medication Instructions:  Your physician recommends that you continue on your current medications as directed. Please refer to the Current Medication list given to you today.  Take your Pyridstigmine 60mg  - 1 tablet at 9am and 3pm  *If you need a refill on your cardiac medications before your next appointment, please call your pharmacy*   Lab Work: None ordered.  If you have labs (blood work) drawn today and your tests are completely normal, you will receive your results only by: Marland Kitchen MyChart Message (if you have MyChart) OR . A paper copy in the mail If you have any lab test that is abnormal or we need to change your treatment, we will call you to review the results.   Testing/Procedures: None ordered.    Follow-Up: At [facility], you and your health needs are our priority.  As part of our continuing mission to provide you with exceptional heart care, we have created designated Provider Care Teams.  These Care Teams include your primary Cardiologist (physician) and Advanced Practice Providers (APPs -  Physician Assistants and Nurse Practitioners) who all work together to provide you with the care you need, when you need it.  We recommend signing up for the patient portal called "MyChart".  Sign up information is provided on this After Visit Summary.  MyChart is used to connect with patients for Virtual Visits (Telemedicine).  Patients are able to view lab/test results, encounter notes, upcoming appointments, etc.  Non-urgent messages can be sent to your provider as well.   To learn more about what you can do with MyChart, go to [email or web address].    Your next appointment:   8 week(s)  The format for your next appointment:   In Person  Provider:   Dr Radford Pax   Other Instructions Abdominal binder and thigh sleeves as discussed by Dr Caryl Comes.

## 2021-01-12 NOTE — Progress Notes (Signed)
ELECTROPHYSIOLOGY CONSULT NOTE  Patient ID: Mathew Meditz., MRN: [id number], DOB/AGE: 77-Apr-1947 77 y.o. Admit date: (Not on file) Date of Consult: 01/12/2021  Primary Physician: Unk Pinto, MD Primary Cardiologist: TT     Mathew Ancona Yogi Arther. is a 75 y.o. male who is being seen today for the evaluation of orthostatic hypotension at the request of TT.    HPI Mathew Rockers. is a 75 y.o. male referred for concerns regarding orthostatic hypotension with recurrent falls   He is been evaluated extensively neurologically for dizziness and cognitive impairment dating back to 2018.  He was felt not to have Parkinson's.  There were issues of visual hallucinations.  Further he had falls with left-sided weakness 9/21 an MRI apparently demonstrated 2 acute infarcts.  MRA showed moderate right P2 stenosis.  Carotid Dopplers were unrevealing.  Also found to have hyperreflexia but MRI of the cervical spine did not demonstrate significant spinal canal stenosis with cord compression.  History of remote frontal lobe stroke.   Syncope and falls have been progressive.  He can be unconscious for 3-5 minutes; he is unaware of it when he wakes up  He walks with a walker.  He showers with assistance.  He was referred to cardiology at which appointment his Norvasc hydralazine fludrocortisone ProAmatine and Seroquel were all discontinued.  He had been on a complex regime where medications were adjusted according to his morning blood pressure.  He was started on Mestinon and losartan and then kind of like rip Mathew Miller went to sleep for most of the next 2 weeks.   History of significant systolic hypertension which was aggravated by the addition of fludrocortisone and apparently midodrine  DATE TEST EF   9/21 Echo  60-65%         Date Cr K Hgb  2/22 1.4 1.55 12.8             Most Recent Value 01/15/2016 - 01/13/2021  BP- Lying 155/87 11/24/2020  Pulse- Lying 70 07/17/2020  BP-  Sitting 63/52 (A) 11/24/2020  Pulse- Sitting 72 10/29/2020  BP- Standing at 0 minutes 71/48 (A) 10/29/2020  Pulse- Standing at 0 minutes 80 10/29/2020  BP- Standing at 3 minutes 79/50 (A) Comment: unable to stand 3 minutes, but up at EOB sit to stand x2 07/16/2020  Pulse- Standing at 3 minutes 81 07/16/2020    Past Medical History:  Diagnosis Date  . Constipation   . Hyperlipidemia   . Hypertension   . Prediabetes    no meds   . Vitamin D deficiency       Surgical History:  Past Surgical History:  Procedure Laterality Date  . CATARACT EXTRACTION, BILATERAL  2017  . COLONOSCOPY    . NO PAST SURGERIES       Home Meds: Current Meds  Medication Sig  . donepezil (ARICEPT) 10 MG tablet Take 1 tablet (10 mg total) by mouth at bedtime.  . gabapentin (NEURONTIN) 100 MG capsule Take 1 capsule (100 mg total) by mouth at bedtime.  Marland Kitchen losartan (COZAAR) 50 MG tablet Take 1.5 tablets (75 mg total) by mouth daily.  Marland Kitchen omeprazole (PRILOSEC) 20 MG capsule Take 1 capsule   2 x   /day   to Prevent Heartburn & Indigestion (Patient taking differently: Take 1 capsule   2 x   /day   to Prevent Heartburn & Indigestion)  . potassium chloride SA (KLOR-CON) 20 MEQ tablet Take 1 tablet 2 x /day  for Potassium  . pyridostigmine (MESTINON) 60 MG tablet Take 1 tablet (60 mg total) by mouth in the morning and at bedtime.  . rosuvastatin (CRESTOR) 5 MG tablet TAKE 1 TABLET EVERY DAY FOR CHOLESTEROL  . senna-docusate (GNP STOOL SOFTENER/LAXATIVE) 8.6-50 MG tablet Take 1 tablet Daily for Constipation    Allergies:  Allergies  Allergen Reactions  . Hydrocodone-Acetaminophen Nausea Only  . Sudafed [Pseudoephedrine Hcl]     Urinary retention  . Tetanus Toxoids     Itching, burning feeling, ran a fever    Social History   Socioeconomic History  . Marital status: Married    Spouse name: Not on file  . Number of children: Not on file  . Years of education: Not on file  . Highest education level: Not on  file  Occupational History  . Occupation: retired   Tobacco Use  . Smoking status: Never Smoker  . Smokeless tobacco: Never Used  Vaping Use  . Vaping Use: Never used  Substance and Sexual Activity  . Alcohol use: Yes    Alcohol/week: 0.0 standard drinks    Comment: very Rare /Beer.wine.  . Drug use: No  . Sexual activity: Not on file  Other Topics Concern  . Not on file  Social History Narrative  . Not on file   Social Determinants of Health   Financial Resource Strain: Not on file  Food Insecurity: Not on file  Transportation Needs: Not on file  Physical Activity: Not on file  Stress: Not on file  Social Connections: Not on file  Intimate Partner Violence: Not on file     Family History  Problem Relation Age of Onset  . Hyperlipidemia Mother   . Glaucoma Mother   . Heart disease Father   . Hypertension Father   . Colon cancer Neg Hx   . Esophageal cancer Neg Hx   . Pancreatic cancer Neg Hx   . Prostate cancer Neg Hx   . Rectal cancer Neg Hx   . Stomach cancer Neg Hx      ROS:  Please see the history of present illness.     All other systems reviewed and negative.    Physical Exam  Blood pressure (!) 148/68, pulse 69, height 6\' 2"  (1.88 m), weight 147 lb (66.7 kg), SpO2 98 %. General: Well developed, well nourished male  Sitting in chair with sluggish speechno acute distress. Head: Normocephalic, atraumatic, sclera non-icteric, no xanthomas, nares are without discharge. EENT: normal  Lymph Nodes:  none Neck: Negative for carotid bruits. JVD not elevated. Back:without scoliosis kyphosis  Lungs: Clear bilaterally to auscultation without wheezes, rales, or rhonchi. Breathing is unlabored. Heart: RRR with S1 S2. No   murmur . No rubs, or gallops appreciated. Abdomen: Soft, non-tender, non-distended with normoactive bowel sounds. No hepatomegaly. No rebound/guarding. No obvious abdominal masses. Msk:  Strength and tone appear normal for age. Extremities: No  clubbing or cyanosis. N edema.  Distal pedal pulses are 2+ and equal bilaterally. Skin: Warm and Dry Neuro: Alert and oriented X 3. CN III-XII intact Grossly normal sensory and motor function . Psych:  Responds to questions appropriately with a normal affect.      Labs: Cardiac Enzymes No results for input(s): CKTOTAL, CKMB, TROPONINI in the last 72 hours. CBC Lab Results  Component Value Date   WBC 9.2 10/22/2020   HGB 12.8 (L) 10/22/2020   HCT 38.2 (L) 10/22/2020   MCV 89.7 10/22/2020   PLT 177 10/22/2020   PROTIME: No  results for input(s): LABPROT, INR in the last 72 hours. Chemistry No results for input(s): NA, K, CL, CO2, BUN, CREATININE, CALCIUM, PROT, BILITOT, ALKPHOS, ALT, AST, GLUCOSE in the last 168 hours.  Invalid input(s): LABALBU Lipids Lab Results  Component Value Date   CHOL 130 07/16/2020   HDL 48 07/16/2020   LDLCALC 69 07/16/2020   TRIG 65 07/16/2020   BNP No results found for: PROBNP Thyroid Function Tests: No results for input(s): TSH, T4TOTAL, T3FREE, THYROIDAB in the last 72 hours.  Invalid input(s): FREET3 Miscellaneous No results found for: DDIMER  Radiology/Studies:  CARDIAC EVENT MONITOR  Result Date: 01/04/2021  Predominant rhythm was normal sinus rhythm with average heart rate 65bpm and ranged from 48-111bpm.  No arrhythmias     EKG:     Assessment and Plan:   Orthostatic hypotension--  Autonomic failure  Neurocognitive deficits/dementia  Hypertension   The patient has significant systolic supine hypertension and profound orthostatic hypotension without appreciable change in her heart rate suggesting autonomic failure.  His neurocognitive process is not clear to me; however, I will defer to Dr. Tomi Likens as to whether he would qualify as pure autonomic failure or is a secondary process.  I reviewed with the patient's wife the inexorable progression of this if this is in fact pure autonomic failure.  He has not tolerated  fludrocortisone; this is not a surprise given its impact on supine blood pressure; however, the exclusion of midodrine is something we may be able to work around realizing that it should be given after he awakens in the morning and after he awakens from his nap; in the interim, we should continue him on pyridostigmine but he should take it at 9 in the morning and at 3:00 in the afternoon as opposed to twice daily.  This may help.  He is wearing a 12 inch binder.  I think this is too large.  Would benefit from either 8 or 9 inch binder.  We also discussed the role of thigh sleeves.  These are more effective than calf compression and easier to put on than waist high pantyhose.  He is has a sleep disturbance.  He will need help for this as his wife is exhausted been up with him all night long.      Virl Axe

## 2021-01-13 ENCOUNTER — Ambulatory Visit: Payer: Medicare HMO | Admitting: Neurology

## 2021-01-13 ENCOUNTER — Other Ambulatory Visit: Payer: Self-pay | Admitting: Neurology

## 2021-01-13 DIAGNOSIS — E876 Hypokalemia: Secondary | ICD-10-CM | POA: Diagnosis not present

## 2021-01-13 DIAGNOSIS — N183 Chronic kidney disease, stage 3 unspecified: Secondary | ICD-10-CM | POA: Diagnosis not present

## 2021-01-13 DIAGNOSIS — F0390 Unspecified dementia without behavioral disturbance: Secondary | ICD-10-CM | POA: Diagnosis not present

## 2021-01-13 DIAGNOSIS — I129 Hypertensive chronic kidney disease with stage 1 through stage 4 chronic kidney disease, or unspecified chronic kidney disease: Secondary | ICD-10-CM | POA: Diagnosis not present

## 2021-01-13 DIAGNOSIS — E1122 Type 2 diabetes mellitus with diabetic chronic kidney disease: Secondary | ICD-10-CM | POA: Diagnosis not present

## 2021-01-13 DIAGNOSIS — I951 Orthostatic hypotension: Secondary | ICD-10-CM | POA: Diagnosis not present

## 2021-01-13 DIAGNOSIS — I69354 Hemiplegia and hemiparesis following cerebral infarction affecting left non-dominant side: Secondary | ICD-10-CM | POA: Diagnosis not present

## 2021-01-13 DIAGNOSIS — E559 Vitamin D deficiency, unspecified: Secondary | ICD-10-CM | POA: Diagnosis not present

## 2021-01-13 DIAGNOSIS — E785 Hyperlipidemia, unspecified: Secondary | ICD-10-CM | POA: Diagnosis not present

## 2021-01-13 MED ORDER — BELSOMRA 10 MG PO TABS: 10.0000 mg | ORAL_TABLET | Freq: Every day | ORAL | 5 refills | Status: DC

## 2021-01-14 ENCOUNTER — Other Ambulatory Visit: Payer: Self-pay | Admitting: Internal Medicine

## 2021-01-14 DIAGNOSIS — F0390 Unspecified dementia without behavioral disturbance: Secondary | ICD-10-CM | POA: Diagnosis not present

## 2021-01-14 DIAGNOSIS — E1122 Type 2 diabetes mellitus with diabetic chronic kidney disease: Secondary | ICD-10-CM | POA: Diagnosis not present

## 2021-01-14 DIAGNOSIS — E876 Hypokalemia: Secondary | ICD-10-CM | POA: Diagnosis not present

## 2021-01-14 DIAGNOSIS — I129 Hypertensive chronic kidney disease with stage 1 through stage 4 chronic kidney disease, or unspecified chronic kidney disease: Secondary | ICD-10-CM | POA: Diagnosis not present

## 2021-01-14 DIAGNOSIS — I951 Orthostatic hypotension: Secondary | ICD-10-CM | POA: Diagnosis not present

## 2021-01-14 DIAGNOSIS — E559 Vitamin D deficiency, unspecified: Secondary | ICD-10-CM | POA: Diagnosis not present

## 2021-01-14 DIAGNOSIS — E785 Hyperlipidemia, unspecified: Secondary | ICD-10-CM | POA: Diagnosis not present

## 2021-01-14 DIAGNOSIS — I69354 Hemiplegia and hemiparesis following cerebral infarction affecting left non-dominant side: Secondary | ICD-10-CM | POA: Diagnosis not present

## 2021-01-14 DIAGNOSIS — N183 Chronic kidney disease, stage 3 unspecified: Secondary | ICD-10-CM | POA: Diagnosis not present

## 2021-01-16 DIAGNOSIS — I951 Orthostatic hypotension: Secondary | ICD-10-CM | POA: Diagnosis not present

## 2021-01-16 DIAGNOSIS — I129 Hypertensive chronic kidney disease with stage 1 through stage 4 chronic kidney disease, or unspecified chronic kidney disease: Secondary | ICD-10-CM | POA: Diagnosis not present

## 2021-01-16 DIAGNOSIS — E1122 Type 2 diabetes mellitus with diabetic chronic kidney disease: Secondary | ICD-10-CM | POA: Diagnosis not present

## 2021-01-16 DIAGNOSIS — N183 Chronic kidney disease, stage 3 unspecified: Secondary | ICD-10-CM | POA: Diagnosis not present

## 2021-01-16 DIAGNOSIS — E785 Hyperlipidemia, unspecified: Secondary | ICD-10-CM | POA: Diagnosis not present

## 2021-01-16 DIAGNOSIS — F0390 Unspecified dementia without behavioral disturbance: Secondary | ICD-10-CM | POA: Diagnosis not present

## 2021-01-16 DIAGNOSIS — E559 Vitamin D deficiency, unspecified: Secondary | ICD-10-CM | POA: Diagnosis not present

## 2021-01-16 DIAGNOSIS — E876 Hypokalemia: Secondary | ICD-10-CM | POA: Diagnosis not present

## 2021-01-16 DIAGNOSIS — I69354 Hemiplegia and hemiparesis following cerebral infarction affecting left non-dominant side: Secondary | ICD-10-CM | POA: Diagnosis not present

## 2021-01-17 DIAGNOSIS — E1122 Type 2 diabetes mellitus with diabetic chronic kidney disease: Secondary | ICD-10-CM | POA: Diagnosis not present

## 2021-01-17 DIAGNOSIS — I69354 Hemiplegia and hemiparesis following cerebral infarction affecting left non-dominant side: Secondary | ICD-10-CM | POA: Diagnosis not present

## 2021-01-17 DIAGNOSIS — F0390 Unspecified dementia without behavioral disturbance: Secondary | ICD-10-CM | POA: Diagnosis not present

## 2021-01-17 DIAGNOSIS — I129 Hypertensive chronic kidney disease with stage 1 through stage 4 chronic kidney disease, or unspecified chronic kidney disease: Secondary | ICD-10-CM | POA: Diagnosis not present

## 2021-01-17 DIAGNOSIS — E559 Vitamin D deficiency, unspecified: Secondary | ICD-10-CM | POA: Diagnosis not present

## 2021-01-17 DIAGNOSIS — I951 Orthostatic hypotension: Secondary | ICD-10-CM | POA: Diagnosis not present

## 2021-01-17 DIAGNOSIS — N183 Chronic kidney disease, stage 3 unspecified: Secondary | ICD-10-CM | POA: Diagnosis not present

## 2021-01-17 DIAGNOSIS — E876 Hypokalemia: Secondary | ICD-10-CM | POA: Diagnosis not present

## 2021-01-17 DIAGNOSIS — E785 Hyperlipidemia, unspecified: Secondary | ICD-10-CM | POA: Diagnosis not present

## 2021-01-19 ENCOUNTER — Encounter: Payer: Self-pay | Admitting: Internal Medicine

## 2021-01-19 DIAGNOSIS — E876 Hypokalemia: Secondary | ICD-10-CM | POA: Diagnosis not present

## 2021-01-19 DIAGNOSIS — E559 Vitamin D deficiency, unspecified: Secondary | ICD-10-CM | POA: Diagnosis not present

## 2021-01-19 DIAGNOSIS — I129 Hypertensive chronic kidney disease with stage 1 through stage 4 chronic kidney disease, or unspecified chronic kidney disease: Secondary | ICD-10-CM | POA: Diagnosis not present

## 2021-01-19 DIAGNOSIS — N183 Chronic kidney disease, stage 3 unspecified: Secondary | ICD-10-CM | POA: Diagnosis not present

## 2021-01-19 DIAGNOSIS — I951 Orthostatic hypotension: Secondary | ICD-10-CM | POA: Diagnosis not present

## 2021-01-19 DIAGNOSIS — E1122 Type 2 diabetes mellitus with diabetic chronic kidney disease: Secondary | ICD-10-CM | POA: Diagnosis not present

## 2021-01-19 DIAGNOSIS — F0390 Unspecified dementia without behavioral disturbance: Secondary | ICD-10-CM | POA: Diagnosis not present

## 2021-01-19 DIAGNOSIS — I69354 Hemiplegia and hemiparesis following cerebral infarction affecting left non-dominant side: Secondary | ICD-10-CM | POA: Diagnosis not present

## 2021-01-19 DIAGNOSIS — E785 Hyperlipidemia, unspecified: Secondary | ICD-10-CM | POA: Diagnosis not present

## 2021-01-19 NOTE — Patient Instructions (Addendum)
Due to recent changes in healthcare laws, you may see the results of your imaging and laboratory studies on MyChart before your provider has had a chance to review them.  We understand that in some cases there may be results that are confusing or concerning to you. Not all laboratory results come back in the same time frame and the provider may be waiting for multiple results in order to interpret others.  Please give us 48 hours in order for your provider to thoroughly review all the results before contacting the office for clarification of your results.     ++++++++++++++++++++++++++++++++++  Vit D  & Vit C 1,000 mg   are recommended to help protect  against the Covid-19 and other Corona viruses.    Also it's recommended  to take  Zinc 50 mg  to help  protect against the Covid-19   and best place to get  is also on [employer]  and don't pay more than 6-8 cents /pill !   ===================================== Coronavirus (COVID-19) Are you at risk?  Are you at risk for the Coronavirus (COVID-19)?  To be considered HIGH RISK for Coronavirus (COVID-19), you have to meet the following criteria:  . Traveled to [place], [place], [place], [place] or [place]; or in the [place] to [place], [place], [place]  . or [place]; and have fever, cough, and shortness of breath within the last 2 weeks of travel OR . Been in close contact with a person diagnosed with COVID-19 within the last 2 weeks and have  . fever, cough,and shortness of breath .  . IF YOU DO NOT MEET THESE CRITERIA, YOU ARE CONSIDERED LOW RISK FOR COVID-19.  What to do if you are HIGH RISK for COVID-19?  . If you are having a medical emergency, call 911. . Seek medical care right away. Before you go to a doctor's office, urgent care or emergency department, .  call ahead and tell them about your recent travel, contact with someone diagnosed with COVID-19  .  and your symptoms.  . You should receive instructions  from your physician's office regarding next steps of care.  . When you arrive at healthcare provider, tell the healthcare staff immediately you have returned from  . visiting [place], [place], [place], [place] or [place]; or traveled in the [place] to [place], [place],  . [place] or [place] in the last two weeks or you have been in close contact with a person diagnosed with  . COVID-19 in the last 2 weeks.   . Tell the health care staff about your symptoms: fever, cough and shortness of breath. . After you have been seen by a medical provider, you will be either: o Tested for (COVID-19) and discharged home on quarantine except to seek medical care if  o symptoms worsen, and asked to  - Stay home and avoid contact with others until you get your results (4-5 days)  - Avoid travel on public transportation if possible (such as bus, train, or airplane) or o Sent to the Emergency Department by EMS for evaluation, COVID-19 testing  and  o possible admission depending on your condition and test results.  What to do if you are LOW RISK for COVID-19?  Reduce your risk of any infection by using the same precautions used for avoiding the common cold or flu:  . Wash your hands often with soap and warm water for at least 20 seconds.  If soap and water   are not readily available,  . use an alcohol-based hand sanitizer with at least 60% alcohol.  . If coughing or sneezing, cover your mouth and nose by coughing or sneezing into the elbow areas of your shirt or coat, .  into a tissue or into your sleeve (not your hands). . Avoid shaking hands with others and consider head nods or verbal greetings only. . Avoid touching your eyes, nose, or mouth with unwashed hands.  . Avoid close contact with people who are sick. . Avoid places or events with large numbers of people in one location, like concerts or sporting events. . Carefully consider travel plans you have or are making. . If you are planning  any travel outside or inside the [place], visit the CDC's Travelers' Health webpage for the latest health notices. . If you have some symptoms but not all symptoms, continue to monitor at home and seek medical attention  . if your symptoms worsen. . If you are having a medical emergency, call 911.   ++++++++++++++++++++++++++++++++ Recommend Adult Low Dose Aspirin or  coated  Aspirin 81 mg daily  To reduce risk of Colon Cancer 40 %,  Skin Cancer 26 % ,  Melanoma 46%  and  Pancreatic cancer 60% ++++++++++++++++++++++++++++++++ Vitamin D goal  is between 70-100.  Please make sure that you are taking your Vitamin D as directed.  It is very important as a natural anti-inflammatory  helping hair, skin, and nails, as well as reducing stroke and heart attack risk.  It helps your bones and helps with mood. It also decreases numerous cancer risks so please take it as directed.  Low Vit D is associated with a 200-300% higher risk for CANCER  and 200-300% higher risk for HEART   ATTACK  &  STROKE.   ...................................... It is also associated with higher death rate at younger ages,  autoimmune diseases like Rheumatoid arthritis, Lupus, Multiple Sclerosis.    Also many other serious conditions, like depression, Alzheimer's Dementia, infertility, muscle aches, fatigue, fibromyalgia - just to name a few. ++++++++++++++++++++ Recommend the book "The END of DIETING" by Dr Joel Fuhrman  & the book "The END of DIABETES " by Dr Joel Fuhrman At [employer] - get book & Audio CD's    Being diabetic has a  300% increased risk for heart attack, stroke, cancer, and alzheimer- type vascular dementia. It is very important that you work harder with diet by avoiding all foods that are white. Avoid white rice (brown & wild rice is OK), white potatoes (sweetpotatoes in moderation is OK), White bread or wheat bread or anything made out of white flour like bagels, donuts, rolls, buns, biscuits, cakes,  pastries, cookies, pizza crust, and pasta (made from white flour & egg whites) - vegetarian pasta or spinach or wheat pasta is OK. Multigrain breads like Arnold's or Pepper[employer], or multigrain sandwich thins or flatbreads.  Diet, exercise and weight loss can reverse and cure diabetes in the early stages.  Diet, exercise and weight loss is very important in the control and prevention of complications of diabetes which affects every system in your body, ie. Brain - dementia/stroke, eyes - glaucoma/blindness, heart - heart attack/heart failure, kidneys - dialysis, stomach - gastric paralysis, intestines - malabsorption, nerves - severe painful neuritis, circulation - gangrene & loss of a leg(s), and finally cancer and Alzheimers.    I recommend avoid fried & greasy foods,  sweets/candy, white rice (brown or wild rice or Quinoa is OK), white potatoes (  sweet potatoes are OK) - anything made from white flour - bagels, doughnuts, rolls, buns, biscuits,white and wheat breads, pizza crust and traditional pasta made of white flour & egg white(vegetarian pasta or spinach or wheat pasta is OK).  Multi-grain bread is OK - like multi-grain flat bread or sandwich thins. Avoid alcohol in excess. Exercise is also important.    Eat all the vegetables you want - avoid meat, especially red meat and dairy - especially cheese.  Cheese is the most concentrated form of trans-fats which is the worst thing to clog up our arteries. Veggie cheese is OK which can be found in the fresh produce section at [employer] or [employer] or [employer]  +++++++++++++++++++++ DASH Eating Plan  DASH stands for "Dietary Approaches to Stop Hypertension."   The DASH eating plan is a healthy eating plan that has been shown to reduce high blood pressure (hypertension). Additional health benefits may include reducing the risk of type 2 diabetes mellitus, heart disease, and stroke. The DASH eating plan may also help with weight loss. WHAT DO I  NEED TO KNOW ABOUT THE DASH EATING PLAN? For the DASH eating plan, you will follow these general guidelines:  Choose foods with a percent daily value for sodium of less than 5% (as listed on the food label).  Use salt-free seasonings or herbs instead of table salt or sea salt.  Check with your health care provider or pharmacist before using salt substitutes.  Eat lower-sodium products, often labeled as "lower sodium" or "no salt added."  Eat fresh foods.  Eat more vegetables, fruits, and low-fat dairy products.  Choose whole grains. Look for the word "whole" as the first word in the ingredient list.  Choose fish   Limit sweets, desserts, sugars, and sugary drinks.  Choose heart-healthy fats.  Eat veggie cheese   Eat more home-cooked food and less restaurant, buffet, and fast food.  Limit fried foods.  Cook foods using methods other than frying.  Limit canned vegetables. If you do use them, rinse them well to decrease the sodium.  When eating at a restaurant, ask that your food be prepared with less salt, or no salt if possible.                      WHAT FOODS CAN I EAT? Read Dr Joel Fuhrman's books on The End of Dieting & The End of Diabetes  Grains Whole grain or whole wheat bread. Brown rice. Whole grain or whole wheat pasta. Quinoa, bulgur, and whole grain cereals. Low-sodium cereals. Corn or whole wheat flour tortillas. Whole grain cornbread. Whole grain crackers. Low-sodium crackers.  Vegetables Fresh or frozen vegetables (raw, steamed, roasted, or grilled). Low-sodium or reduced-sodium tomato and vegetable juices. Low-sodium or reduced-sodium tomato sauce and paste. Low-sodium or reduced-sodium canned vegetables.   Fruits All fresh, canned (in natural juice), or frozen fruits.  Protein Products  All fish and seafood.  Dried beans, peas, or lentils. Unsalted nuts and seeds. Unsalted canned beans.  Dairy Low-fat dairy products, such as skim or 1% milk, 2% or  reduced-fat cheeses, low-fat ricotta or cottage cheese, or plain low-fat yogurt. Low-sodium or reduced-sodium cheeses.  Fats and Oils Tub margarines without trans fats. Light or reduced-fat mayonnaise and salad dressings (reduced sodium). Avocado. Safflower, olive, or canola oils. Natural peanut or almond butter.  Other Unsalted popcorn and pretzels. The items listed above may not be a complete list of recommended foods or beverages. Contact your dietitian for more   options.  +++++++++++++++  WHAT FOODS ARE NOT RECOMMENDED? Grains/ White flour or wheat flour White bread. White pasta. White rice. Refined cornbread. Bagels and croissants. Crackers that contain trans fat.  Vegetables  Creamed or fried vegetables. Vegetables in a . Regular canned vegetables. Regular canned tomato sauce and paste. Regular tomato and vegetable juices.  Fruits Dried fruits. Canned fruit in light or heavy syrup. Fruit juice.  Meat and Other Protein Products Meat in general - RED meat & White meat.  Fatty cuts of meat. Ribs, chicken wings, all processed meats as bacon, sausage, bologna, salami, fatback, hot dogs, bratwurst and packaged luncheon meats.  Dairy Whole or 2% milk, cream, half-and-half, and cream cheese. Whole-fat or sweetened yogurt. Full-fat cheeses or blue cheese. Non-dairy creamers and whipped toppings. Processed cheese, cheese spreads, or cheese curds.  Condiments Onion and garlic salt, seasoned salt, table salt, and sea salt. Canned and packaged gravies. Worcestershire sauce. Tartar sauce. Barbecue sauce. Teriyaki sauce. Soy sauce, including reduced sodium. Steak sauce. Fish sauce. Oyster sauce. Cocktail sauce. Horseradish. Ketchup and mustard. Meat flavorings and tenderizers. Bouillon cubes. Hot sauce. Tabasco sauce. Marinades. Taco seasonings. Relishes.  Fats and Oils Butter, stick margarine, lard, shortening and bacon fat. Coconut, palm kernel, or palm oils. Regular salad  dressings.  Pickles and olives. Salted popcorn and pretzels.  The items listed above may not be a complete list of foods and beverages to avoid. ++++++++++++++++++++++++++++++++++++  Orthostatic Hypotension  Blood pressure is a measurement of how strongly, or weakly, your blood is pressing against the walls of your arteries. Orthostatic hypotension is a sudden drop in blood pressure that happens when you quickly change positions, such as when you get up from sitting or lying down. Arteries are blood vessels that carry blood from your heart throughout your body. When blood pressure is too low, you may not get enough blood to your brain or to the rest of your organs. This can cause weakness, light-headedness, rapid heartbeat, and fainting. This can last for just a few seconds or for up to a few minutes. Orthostatic hypotension is usually not a serious problem. However, if it happens frequently or gets worse, it may be a sign of something more serious. What are the causes? This condition may be caused by:  Sudden changes in posture, such as standing up quickly after you have been sitting or lying down.  Blood loss.  Loss of body fluids (dehydration).  Heart problems.  Hormone (endocrine) problems.  Pregnancy.  Severe infection.  Lack of certain nutrients.  Severe allergic reactions (anaphylaxis).  Certain medicines, such as blood pressure medicine or medicines that make the body lose excess fluids (diuretics). Sometimes, this condition can be caused by not taking medicine as directed, such as taking too much of a certain medicine. What increases the risk? The following factors may make you more likely to develop this condition:  Age. Risk increases as you get older.  Conditions that affect the heart or the central nervous system.  Taking certain medicines, such as blood pressure medicine or diuretics.  Being pregnant. What are the signs or symptoms? Symptoms of this condition  may include:  Weakness.  Light-headedness.  Dizziness.  Blurred vision.  Fatigue.  Rapid heartbeat.  Fainting, in severe cases. How is this diagnosed? This condition is diagnosed based on:  Your medical history.  Your symptoms.  Your blood pressure measurement. Your health care provider will check your blood pressure when you are: ? Lying down. ? Sitting. ? Standing. A  blood pressure reading is recorded as two numbers, such as "120 over 80" (or 120/80). The first ("top") number is called the systolic pressure. It is a measure of the pressure in your arteries as your heart beats. The second ("bottom") number is called the diastolic pressure. It is a measure of the pressure in your arteries when your heart relaxes between beats. Blood pressure is measured in a unit called mm Hg. Healthy blood pressure for most adults is 120/80. If your blood pressure is below 90/60, you may be diagnosed with hypotension. Other information or tests that may be used to diagnose orthostatic hypotension include:  Your other vital signs, such as your heart rate and temperature.  Blood tests.  Tilt table test. For this test, you will be safely secured to a table that moves you from a lying position to an upright position. Your heart rhythm and blood pressure will be monitored during the test. How is this treated? This condition may be treated by:  Changing your diet. This may involve eating more salt (sodium) or drinking more water.  Taking medicines to raise your blood pressure.  Changing the dosage of certain medicines you are taking that might be lowering your blood pressure.  Wearing compression stockings. These stockings help to prevent blood clots and reduce swelling in your legs. In some cases, you may need to go to the hospital for:  Fluid replacement. This means you will receive fluids through an IV.  Blood replacement. This means you will receive donated blood through an IV  (transfusion).  Treating an infection or heart problems, if this applies.  Monitoring. You may need to be monitored while medicines that you are taking wear off. Follow these instructions at home: Eating and drinking  Drink enough fluid to keep your urine pale yellow.  Eat a healthy diet, and follow instructions from your health care provider about eating or drinking restrictions. A healthy diet includes: ? Fresh fruits and vegetables. ? Whole grains. ? Lean meats. ? Low-fat dairy products.  Eat extra salt only as directed. Do not add extra salt to your diet unless your health care provider told you to do that.  Eat frequent, small meals.  Avoid standing up suddenly after eating.  Medicines  Take over-the-counter and prescription medicines only as told by your health care provider. ? Follow instructions from your health care provider about changing the dosage of your current medicines, if this applies. ? Do not stop or adjust any of your medicines on your own. General instructions  Wear compression stockings as told by your health care provider.  Get up slowly from lying down or sitting positions. This gives your blood pressure a chance to adjust.  Avoid hot showers and excessive heat as directed by your health care provider.  Return to your normal activities as told by your health care provider. Ask your health care provider what activities are safe for you.  Do not use any products that contain nicotine or tobacco, such as cigarettes, e-cigarettes, and chewing tobacco. If you need help quitting, ask your health care provider.  Keep all follow-up visits as told by your health care provider. This is important.  Contact a health care provider if you:  Vomit.  Have diarrhea.  Have a fever for more than 2-3 days.  Feel more thirsty than usual.  Feel weak and tired. Get help right away if you:  Have chest pain.  Have a fast or irregular heartbeat.  Develop numbness  in  any part of your body.  Cannot move your arms or your legs.  Have trouble speaking.  Become sweaty or feel light-headed.  Faint.  Feel short of breath.  Have trouble staying awake.  Feel confused. Summary  Orthostatic hypotension is a sudden drop in blood pressure that happens when you quickly change positions.  Orthostatic hypotension is usually not a serious problem.  It is diagnosed by having your blood pressure taken lying down, sitting, and then standing.  It may be treated by changing your diet or adjusting your medicines. This information is not intended to replace advice given to you by your health care provider. Make sure you discuss any questions you have with your health care provider.

## 2021-01-19 NOTE — Progress Notes (Signed)
=  Future Appointments  Date Time Provider [place]  01/20/2021 11:30 AM Unk Pinto, MD [facility] None  03/10/2021 11:00 AM Sueanne Margarita, MD [facility]  06/02/2021 11:30 AM Pieter Partridge, DO [facility] None    History of Present Illness:      This very nice 75 y.o. [name] presents for 3 month follow up with HTN / Hypotension, HLD, Pre-Diabetes and Vitamin D Deficiency.        Patient has hx/o significant  Labile Systolic Supine Hypertension and profound Orthostatic Hypotension and is now being followed by Dr Caryl Comes for med management of his BP. Patient also has progressive neuro-cognitive defecit~ Dementia ands also was recently evaluated by Dr Tomi Likens. Today's sitting BP was 108/66.  Patient has had no complaints of any cardiac type chest pain, palpitations, dyspnea / orthopnea / PND,  claudication or dependent edema.  Patient remain wheelchair bound & is unable to ambulate independently due to his profound hypotension with  Standing.       Hyperlipidemia is controlled with diet & Rosuvastatin. Patient denies myalgias or other med SE's. Last Lipids were at goal:  Lab Results  Component Value Date   CHOL 130 07/16/2020   HDL 48 07/16/2020   LDLCALC 69 07/16/2020   TRIG 65 07/16/2020   CHOLHDL 2.7 07/16/2020     Also, the patient has history of PreDiabetes and has had no symptoms of reactive hypoglycemia, diabetic polys, paresthesias or visual blurring.  Last A1c was  Normal & at goal:  Lab Results  Component Value Date   HGBA1C 4.9 07/16/2020            Further, the patient also has history of Vitamin D Deficiency and supplements vitamin D without any suspected side-effects. Last vitamin D was at goal:  Lab Results  Component Value Date   VD25OH 76 12/20/2019    Current Outpatient Medications on File Prior to Visit  Medication Sig  . donepezil 10 MG tablet Take 1 tablet  at bedtime.  . gabapentin  100 MG capsule Take 1 capsule  at bedtime.  Marland Kitchen  losartan  50 MG tablet Take 1.5 tablets  daily.  Marland Kitchen omeprazole  20 MG capsule Take 1 capsule 2 x /day   . potassium chloride 20 MEQ TAKE 1 TABLET TWICE DAILY    . pyridostigmine (MESTINON) 60 MG tablet Take 1 tablet  in the morning and at bedtime.  . rosuvastatin  5 MG tablet TAKE 1 TABLET EVERY DAY FOR CHOLESTEROL  . senna-docusate  8.6-50 MG tablet Take 1 tablet Daily  . Suvorexant  10 MG TABS Take  at bedtime.     Allergies  Allergen Reactions  . Hydrocodone-Acetaminophen Nausea Only  . Sudafed [Pseudoephedrine Hcl]     Urinary retention  . Tetanus Toxoids     Itching, burning feeling, ran a fever    PMHx:   Past Medical History:  Diagnosis Date  . Constipation   . Hyperlipidemia   . Hypertension   . Prediabetes    no meds   . Vitamin D deficiency     Immunization History  Administered Date(s) Administered  . Influenza Split 08/06/2013  . Influenza, High Dose Seasonal PF 07/30/2014, 07/29/2015, 08/11/2016, 07/20/2017, 07/18/2018  . Influenza-Unspecified 07/18/2018, 06/24/2019  . PFIZER(Purple Top)SARS-COV-2 Vaccination 11/04/2019, 11/25/2019  . Pneumococcal Conjugate-13 04/23/2016  . Pneumococcal Polysaccharide-23 10/21/2009  . Td 10/18/1965  . Zoster 10/19/2011  . Zoster Recombinat (Shingrix) 03/17/2018, 06/08/2018    Past Surgical History:  Procedure Laterality  Date  . CATARACT EXTRACTION, BILATERAL  2017  . COLONOSCOPY    . NO PAST SURGERIES      FHx:    Reviewed / unchanged  SHx:    Reviewed / unchanged   Systems Review:  Constitutional: Denies fever, chills, wt changes, headaches, insomnia, fatigue, night sweats, change in appetite. Eyes: Denies redness, blurred vision, diplopia, discharge, itchy, watery eyes.  ENT: Denies discharge, congestion, post nasal drip, epistaxis, sore throat, earache, hearing loss, dental pain, tinnitus, vertigo, sinus pain, snoring.  CV: Denies chest pain, palpitations, irregular heartbeat, syncope, dyspnea, diaphoresis,  orthopnea, PND, claudication or edema. Respiratory: denies cough, dyspnea, DOE, pleurisy, hoarseness, laryngitis, wheezing.  Gastrointestinal: Denies dysphagia, odynophagia, heartburn, reflux, water brash, abdominal pain or cramps, nausea, vomiting, bloating, diarrhea, constipation, hematemesis, melena, hematochezia  or hemorrhoids. Genitourinary: Denies dysuria, frequency, urgency, nocturia, hesitancy, discharge, hematuria or flank pain. Musculoskeletal: Denies arthralgias, myalgias, stiffness, jt. swelling, pain, limping or strain/sprain.  Skin: Denies pruritus, rash, hives, warts, acne, eczema or change in skin lesion(s). Neuro: No weakness, tremor, incoordination, spasms, paresthesia or pain. Psychiatric: Denies confusion, memory loss or sensory loss. Endo: Denies change in weight, skin or hair change.  Heme/Lymph: No excessive bleeding, bruising or enlarged lymph nodes.  Physical Exam  BP  Sitting 108/66  /  Standing BP does not measure   Pulse 65   Temp 97.9 F (36.6 C)   Resp 16   Ht 6\' 2"  (1.88 m)   Wt 147 lb (66.7 kg)   SpO2 99%   BMI 18.87 kg/m   Appears  well nourished, well groomed  and in no distress.  Eyes: PERRLA, EOMs, conjunctiva no swelling or erythema. Sinuses: No frontal/maxillary tenderness ENT/Mouth: EAC's clear, TM's nl w/o erythema, bulging. Nares clear w/o erythema, swelling, exudates. Oropharynx clear without erythema or exudates. Oral hygiene is good. Tongue normal, non obstructing. Hearing intact.  Neck: Supple. Thyroid not palpable. Car 2+/2+ without bruits, nodes or JVD. Chest: Respirations nl with BS clear & equal w/o rales, rhonchi, wheezing or stridor.  Cor: Heart sounds normal w/ regular rate and rhythm without sig. murmurs, gallops, clicks or rubs. Peripheral pulses normal and equal  without edema.  Abdomen: Soft & bowel sounds normal. Non-tender w/o guarding, rebound, hernias, masses or organomegaly.  Lymphatics: Unremarkable.  Musculoskeletal:  Full ROM all peripheral extremities, joint stability, 5/5 strength and normal gait.  Skin: Warm, dry without exposed rashes, lesions or ecchymosis apparent.  Neuro: Cranial nerves intact, reflexes equal bilaterally. Sensory-motor testing grossly intact. Tendon reflexes grossly intact.  Pysch: Alert & oriented x 1-2.   Insight and judgement very limited. Essentially no short term recall. No ideations.  Assessment and Plan:  1. Labile hypertension  - Continue medication, monitor blood pressure at home.  - Continue DASH diet.  Reminder to go to the ER if any CP,  SOB, nausea, dizziness, severe HA, changes vision/speech.  - CBC with Differential/Platelet - COMPLETE METABOLIC PANEL WITH GFR - Magnesium - TSH  2. Orthostatic hypotension, severe  - CBC with Differential/Platelet - COMPLETE METABOLIC PANEL WITH GFR - Magnesium - TSH  3. Hyperlipidemia, mixed  - Continue diet/meds, exercise,& lifestyle modifications.  - Continue monitor periodic cholesterol/liver & renal functions   - Lipid panel - TSH  4. Abnormal glucose  - Continue diet, exercise  - Lifestyle modifications.  - Monitor appropriate labs.  - Hemoglobin A1c - Insulin, random  5. Vitamin D deficiency  - Continue supplementation.  - VITAMIN D 25 Hydroxy  6. Vascular dementia  without behavioral disturbance (HCC)  - Lipid panel  7. Unstable gait   8. Medication management  - CBC with Differential/Platelet - COMPLETE METABOLIC PANEL WITH GFR - Magnesium - Lipid panel - TSH - Hemoglobin A1c - Insulin, random - VITAMIN D 25 Hydrox         Discussed  regular exercise, BP monitoring, weight control to achieve /maintain BMI less than 25 and discussed med and SE's. Recommended labs to assess and monitor clinical status with further disposition pending results of labs.  I discussed the assessment and treatment plan with the patient's caretaker wife.         I related my personal bias is that patient  should have Florinef & ProAmatine restarted & titrated to maintain an acceptable standing BP to allow ambulation and reduce incidence of inevitable falls.        They were provided an opportunity to ask questions and all were answered. They agreed with the plan and demonstrated an understanding of the instructions.  I provided over 30 minutes of exam, counseling, chart review and  complex critical decision making.         They were  advised to call back or seek an in-person evaluation if the symptoms worsen or if the condition fails to improve as anticipated.   Kirtland Bouchard, MD

## 2021-01-20 ENCOUNTER — Other Ambulatory Visit: Payer: Self-pay

## 2021-01-20 ENCOUNTER — Ambulatory Visit (INDEPENDENT_AMBULATORY_CARE_PROVIDER_SITE_OTHER): Payer: Medicare HMO | Admitting: Internal Medicine

## 2021-01-20 VITALS — BP 108/66 | HR 65 | Temp 97.9°F | Resp 16 | Ht 74.0 in | Wt 147.0 lb

## 2021-01-20 DIAGNOSIS — I69354 Hemiplegia and hemiparesis following cerebral infarction affecting left non-dominant side: Secondary | ICD-10-CM | POA: Diagnosis not present

## 2021-01-20 DIAGNOSIS — Z79899 Other long term (current) drug therapy: Secondary | ICD-10-CM

## 2021-01-20 DIAGNOSIS — F0150 Vascular dementia without behavioral disturbance: Secondary | ICD-10-CM | POA: Diagnosis not present

## 2021-01-20 DIAGNOSIS — N183 Chronic kidney disease, stage 3 unspecified: Secondary | ICD-10-CM | POA: Diagnosis not present

## 2021-01-20 DIAGNOSIS — E559 Vitamin D deficiency, unspecified: Secondary | ICD-10-CM | POA: Diagnosis not present

## 2021-01-20 DIAGNOSIS — E876 Hypokalemia: Secondary | ICD-10-CM | POA: Diagnosis not present

## 2021-01-20 DIAGNOSIS — R0989 Other specified symptoms and signs involving the circulatory and respiratory systems: Secondary | ICD-10-CM | POA: Diagnosis not present

## 2021-01-20 DIAGNOSIS — I951 Orthostatic hypotension: Secondary | ICD-10-CM

## 2021-01-20 DIAGNOSIS — E1122 Type 2 diabetes mellitus with diabetic chronic kidney disease: Secondary | ICD-10-CM | POA: Diagnosis not present

## 2021-01-20 DIAGNOSIS — F0390 Unspecified dementia without behavioral disturbance: Secondary | ICD-10-CM | POA: Diagnosis not present

## 2021-01-20 DIAGNOSIS — R2681 Unsteadiness on feet: Secondary | ICD-10-CM

## 2021-01-20 DIAGNOSIS — I129 Hypertensive chronic kidney disease with stage 1 through stage 4 chronic kidney disease, or unspecified chronic kidney disease: Secondary | ICD-10-CM | POA: Diagnosis not present

## 2021-01-20 DIAGNOSIS — R7309 Other abnormal glucose: Secondary | ICD-10-CM | POA: Diagnosis not present

## 2021-01-20 DIAGNOSIS — E782 Mixed hyperlipidemia: Secondary | ICD-10-CM

## 2021-01-20 DIAGNOSIS — E785 Hyperlipidemia, unspecified: Secondary | ICD-10-CM | POA: Diagnosis not present

## 2021-01-21 DIAGNOSIS — E785 Hyperlipidemia, unspecified: Secondary | ICD-10-CM | POA: Diagnosis not present

## 2021-01-21 DIAGNOSIS — I129 Hypertensive chronic kidney disease with stage 1 through stage 4 chronic kidney disease, or unspecified chronic kidney disease: Secondary | ICD-10-CM | POA: Diagnosis not present

## 2021-01-21 DIAGNOSIS — E559 Vitamin D deficiency, unspecified: Secondary | ICD-10-CM | POA: Diagnosis not present

## 2021-01-21 DIAGNOSIS — I951 Orthostatic hypotension: Secondary | ICD-10-CM | POA: Diagnosis not present

## 2021-01-21 DIAGNOSIS — E876 Hypokalemia: Secondary | ICD-10-CM | POA: Diagnosis not present

## 2021-01-21 DIAGNOSIS — N183 Chronic kidney disease, stage 3 unspecified: Secondary | ICD-10-CM | POA: Diagnosis not present

## 2021-01-21 DIAGNOSIS — I69354 Hemiplegia and hemiparesis following cerebral infarction affecting left non-dominant side: Secondary | ICD-10-CM | POA: Diagnosis not present

## 2021-01-21 DIAGNOSIS — E1122 Type 2 diabetes mellitus with diabetic chronic kidney disease: Secondary | ICD-10-CM | POA: Diagnosis not present

## 2021-01-21 DIAGNOSIS — F0390 Unspecified dementia without behavioral disturbance: Secondary | ICD-10-CM | POA: Diagnosis not present

## 2021-01-21 LAB — HEMOGLOBIN A1C
Hgb A1c MFr Bld: 5.1 % of total Hgb (ref <5.7)
Mean Plasma Glucose: 100 mg/dL
eAG (mmol/L): 5.5 mmol/L

## 2021-01-21 LAB — COMPLETE METABOLIC PANEL WITH GFR
AG Ratio: 1.9 (calc) (ref 1.0–2.5)
ALT: 20 U/L (ref 9–46)
AST: 13 U/L (ref 10–35)
Albumin: 3.7 g/dL (ref 3.6–5.1)
Alkaline phosphatase (APISO): 98 U/L (ref 35–144)
BUN/Creatinine Ratio: 18 (calc) (ref 6–22)
BUN: 26 mg/dL — ABNORMAL HIGH (ref 7–25)
CO2: 28 mmol/L (ref 20–32)
Calcium: 9.4 mg/dL (ref 8.6–10.3)
Chloride: 108 mmol/L (ref 98–110)
Creat: 1.41 mg/dL — ABNORMAL HIGH (ref 0.70–1.18)
GFR, Est African American: 55 mL/min/{1.73_m2} — ABNORMAL LOW (ref >=60)
GFR, Est Non African American: 48 mL/min/{1.73_m2} — ABNORMAL LOW (ref >=60)
Globulin: 2 g/dL (calc) (ref 1.9–3.7)
Glucose, Bld: 92 mg/dL (ref 65–99)
Potassium: 4.2 mmol/L (ref 3.5–5.3)
Sodium: 141 mmol/L (ref 135–146)
Total Bilirubin: 0.6 mg/dL (ref 0.2–1.2)
Total Protein: 5.7 g/dL — ABNORMAL LOW (ref 6.1–8.1)

## 2021-01-21 LAB — CBC WITH DIFFERENTIAL/PLATELET
Absolute Monocytes: 691 cells/uL (ref 200–950)
Basophils Absolute: 65 cells/uL (ref 0–200)
Basophils Relative: 0.6 %
Eosinophils Absolute: 162 cells/uL (ref 15–500)
Eosinophils Relative: 1.5 %
HCT: 40.4 % (ref 38.5–50.0)
Hemoglobin: 13.1 g/dL — ABNORMAL LOW (ref 13.2–17.1)
Lymphs Abs: 1404 cells/uL (ref 850–3900)
MCH: 29.6 pg (ref 27.0–33.0)
MCHC: 32.4 g/dL (ref 32.0–36.0)
MCV: 91.2 fL (ref 80.0–100.0)
MPV: 10.6 fL (ref 7.5–12.5)
Monocytes Relative: 6.4 %
Neutro Abs: 8478 cells/uL — ABNORMAL HIGH (ref 1500–7800)
Neutrophils Relative %: 78.5 %
Platelets: 219 10*3/uL (ref 140–400)
RBC: 4.43 10*6/uL (ref 4.20–5.80)
RDW: 13.1 % (ref 11.0–15.0)
Total Lymphocyte: 13 %
WBC: 10.8 10*3/uL (ref 3.8–10.8)

## 2021-01-21 LAB — INSULIN, RANDOM: Insulin: 17.5 u[IU]/mL

## 2021-01-21 LAB — LIPID PANEL
Cholesterol: 142 mg/dL (ref <200)
HDL: 60 mg/dL (ref >=40)
LDL Cholesterol (Calc): 68 mg/dL (calc)
Non-HDL Cholesterol (Calc): 82 mg/dL (calc) (ref <130)
Total CHOL/HDL Ratio: 2.4 (calc) (ref <5.0)
Triglycerides: 68 mg/dL (ref <150)

## 2021-01-21 LAB — TSH: TSH: 4 mIU/L (ref 0.40–4.50)

## 2021-01-21 LAB — MAGNESIUM: Magnesium: 2.1 mg/dL (ref 1.5–2.5)

## 2021-01-21 LAB — VITAMIN D 25 HYDROXY (VIT D DEFICIENCY, FRACTURES): Vit D, 25-Hydroxy: 74 ng/mL (ref 30–100)

## 2021-01-21 NOTE — Progress Notes (Signed)
============================================================ -   Test results slightly outside the reference range are not unusual. If there is anything important, I will review this with you,  otherwise it is considered normal test values.  If you have further questions,  please do not hesitate to contact me at the office or via My Chart.  ============================================================ ============================================================  -  CBC  - Normal Red cell count & WBC   - Kidney functions still Stage 3a & Stable ============================================================ ============================================================  -  Total Chol = 142  and LDL Chol = 68  -  Both  Excellent   - Very low risk for Heart Attack  / Stroke ============================================================ ============================================================  -  Thyroid - Normal   - A1c - Normal - Great - No Diabetes !  - Vitamin D= 74 - Excellent  ============================================================ ============================================================

## 2021-01-22 DIAGNOSIS — E1122 Type 2 diabetes mellitus with diabetic chronic kidney disease: Secondary | ICD-10-CM | POA: Diagnosis not present

## 2021-01-22 DIAGNOSIS — I129 Hypertensive chronic kidney disease with stage 1 through stage 4 chronic kidney disease, or unspecified chronic kidney disease: Secondary | ICD-10-CM | POA: Diagnosis not present

## 2021-01-22 DIAGNOSIS — N183 Chronic kidney disease, stage 3 unspecified: Secondary | ICD-10-CM | POA: Diagnosis not present

## 2021-01-22 DIAGNOSIS — I951 Orthostatic hypotension: Secondary | ICD-10-CM | POA: Diagnosis not present

## 2021-01-22 DIAGNOSIS — E876 Hypokalemia: Secondary | ICD-10-CM | POA: Diagnosis not present

## 2021-01-22 DIAGNOSIS — E785 Hyperlipidemia, unspecified: Secondary | ICD-10-CM | POA: Diagnosis not present

## 2021-01-22 DIAGNOSIS — F0390 Unspecified dementia without behavioral disturbance: Secondary | ICD-10-CM | POA: Diagnosis not present

## 2021-01-22 DIAGNOSIS — I69354 Hemiplegia and hemiparesis following cerebral infarction affecting left non-dominant side: Secondary | ICD-10-CM | POA: Diagnosis not present

## 2021-01-22 DIAGNOSIS — E559 Vitamin D deficiency, unspecified: Secondary | ICD-10-CM | POA: Diagnosis not present

## 2021-01-23 ENCOUNTER — Telehealth: Payer: Medicare HMO | Admitting: Cardiology

## 2021-01-26 DIAGNOSIS — E876 Hypokalemia: Secondary | ICD-10-CM | POA: Diagnosis not present

## 2021-01-26 DIAGNOSIS — E559 Vitamin D deficiency, unspecified: Secondary | ICD-10-CM | POA: Diagnosis not present

## 2021-01-26 DIAGNOSIS — E1122 Type 2 diabetes mellitus with diabetic chronic kidney disease: Secondary | ICD-10-CM | POA: Diagnosis not present

## 2021-01-26 DIAGNOSIS — I129 Hypertensive chronic kidney disease with stage 1 through stage 4 chronic kidney disease, or unspecified chronic kidney disease: Secondary | ICD-10-CM | POA: Diagnosis not present

## 2021-01-26 DIAGNOSIS — E785 Hyperlipidemia, unspecified: Secondary | ICD-10-CM | POA: Diagnosis not present

## 2021-01-26 DIAGNOSIS — I69354 Hemiplegia and hemiparesis following cerebral infarction affecting left non-dominant side: Secondary | ICD-10-CM | POA: Diagnosis not present

## 2021-01-26 DIAGNOSIS — N183 Chronic kidney disease, stage 3 unspecified: Secondary | ICD-10-CM | POA: Diagnosis not present

## 2021-01-26 DIAGNOSIS — I951 Orthostatic hypotension: Secondary | ICD-10-CM | POA: Diagnosis not present

## 2021-01-26 DIAGNOSIS — F0390 Unspecified dementia without behavioral disturbance: Secondary | ICD-10-CM | POA: Diagnosis not present

## 2021-01-27 DIAGNOSIS — I951 Orthostatic hypotension: Secondary | ICD-10-CM | POA: Diagnosis not present

## 2021-01-27 DIAGNOSIS — I129 Hypertensive chronic kidney disease with stage 1 through stage 4 chronic kidney disease, or unspecified chronic kidney disease: Secondary | ICD-10-CM | POA: Diagnosis not present

## 2021-01-27 DIAGNOSIS — E559 Vitamin D deficiency, unspecified: Secondary | ICD-10-CM | POA: Diagnosis not present

## 2021-01-27 DIAGNOSIS — E1122 Type 2 diabetes mellitus with diabetic chronic kidney disease: Secondary | ICD-10-CM | POA: Diagnosis not present

## 2021-01-27 DIAGNOSIS — N183 Chronic kidney disease, stage 3 unspecified: Secondary | ICD-10-CM | POA: Diagnosis not present

## 2021-01-27 DIAGNOSIS — E785 Hyperlipidemia, unspecified: Secondary | ICD-10-CM | POA: Diagnosis not present

## 2021-01-27 DIAGNOSIS — I69354 Hemiplegia and hemiparesis following cerebral infarction affecting left non-dominant side: Secondary | ICD-10-CM | POA: Diagnosis not present

## 2021-01-27 DIAGNOSIS — F0390 Unspecified dementia without behavioral disturbance: Secondary | ICD-10-CM | POA: Diagnosis not present

## 2021-01-27 DIAGNOSIS — E876 Hypokalemia: Secondary | ICD-10-CM | POA: Diagnosis not present

## 2021-01-29 DIAGNOSIS — I129 Hypertensive chronic kidney disease with stage 1 through stage 4 chronic kidney disease, or unspecified chronic kidney disease: Secondary | ICD-10-CM | POA: Diagnosis not present

## 2021-01-29 DIAGNOSIS — I951 Orthostatic hypotension: Secondary | ICD-10-CM | POA: Diagnosis not present

## 2021-01-29 DIAGNOSIS — I69354 Hemiplegia and hemiparesis following cerebral infarction affecting left non-dominant side: Secondary | ICD-10-CM | POA: Diagnosis not present

## 2021-01-29 DIAGNOSIS — E876 Hypokalemia: Secondary | ICD-10-CM | POA: Diagnosis not present

## 2021-01-29 DIAGNOSIS — N183 Chronic kidney disease, stage 3 unspecified: Secondary | ICD-10-CM | POA: Diagnosis not present

## 2021-01-29 DIAGNOSIS — E785 Hyperlipidemia, unspecified: Secondary | ICD-10-CM | POA: Diagnosis not present

## 2021-01-29 DIAGNOSIS — F0390 Unspecified dementia without behavioral disturbance: Secondary | ICD-10-CM | POA: Diagnosis not present

## 2021-01-29 DIAGNOSIS — E1122 Type 2 diabetes mellitus with diabetic chronic kidney disease: Secondary | ICD-10-CM | POA: Diagnosis not present

## 2021-01-29 DIAGNOSIS — E559 Vitamin D deficiency, unspecified: Secondary | ICD-10-CM | POA: Diagnosis not present

## 2021-01-30 DIAGNOSIS — I69354 Hemiplegia and hemiparesis following cerebral infarction affecting left non-dominant side: Secondary | ICD-10-CM | POA: Diagnosis not present

## 2021-01-30 DIAGNOSIS — E1122 Type 2 diabetes mellitus with diabetic chronic kidney disease: Secondary | ICD-10-CM | POA: Diagnosis not present

## 2021-01-30 DIAGNOSIS — E785 Hyperlipidemia, unspecified: Secondary | ICD-10-CM | POA: Diagnosis not present

## 2021-01-30 DIAGNOSIS — N183 Chronic kidney disease, stage 3 unspecified: Secondary | ICD-10-CM | POA: Diagnosis not present

## 2021-01-30 DIAGNOSIS — E876 Hypokalemia: Secondary | ICD-10-CM | POA: Diagnosis not present

## 2021-01-30 DIAGNOSIS — I951 Orthostatic hypotension: Secondary | ICD-10-CM | POA: Diagnosis not present

## 2021-01-30 DIAGNOSIS — F0390 Unspecified dementia without behavioral disturbance: Secondary | ICD-10-CM | POA: Diagnosis not present

## 2021-01-30 DIAGNOSIS — I129 Hypertensive chronic kidney disease with stage 1 through stage 4 chronic kidney disease, or unspecified chronic kidney disease: Secondary | ICD-10-CM | POA: Diagnosis not present

## 2021-01-30 DIAGNOSIS — E559 Vitamin D deficiency, unspecified: Secondary | ICD-10-CM | POA: Diagnosis not present

## 2021-02-02 DIAGNOSIS — E785 Hyperlipidemia, unspecified: Secondary | ICD-10-CM | POA: Diagnosis not present

## 2021-02-02 DIAGNOSIS — E876 Hypokalemia: Secondary | ICD-10-CM | POA: Diagnosis not present

## 2021-02-02 DIAGNOSIS — E1122 Type 2 diabetes mellitus with diabetic chronic kidney disease: Secondary | ICD-10-CM | POA: Diagnosis not present

## 2021-02-02 DIAGNOSIS — E559 Vitamin D deficiency, unspecified: Secondary | ICD-10-CM | POA: Diagnosis not present

## 2021-02-02 DIAGNOSIS — I129 Hypertensive chronic kidney disease with stage 1 through stage 4 chronic kidney disease, or unspecified chronic kidney disease: Secondary | ICD-10-CM | POA: Diagnosis not present

## 2021-02-02 DIAGNOSIS — I69354 Hemiplegia and hemiparesis following cerebral infarction affecting left non-dominant side: Secondary | ICD-10-CM | POA: Diagnosis not present

## 2021-02-02 DIAGNOSIS — I951 Orthostatic hypotension: Secondary | ICD-10-CM | POA: Diagnosis not present

## 2021-02-02 DIAGNOSIS — F0390 Unspecified dementia without behavioral disturbance: Secondary | ICD-10-CM | POA: Diagnosis not present

## 2021-02-02 DIAGNOSIS — N183 Chronic kidney disease, stage 3 unspecified: Secondary | ICD-10-CM | POA: Diagnosis not present

## 2021-02-03 DIAGNOSIS — E1122 Type 2 diabetes mellitus with diabetic chronic kidney disease: Secondary | ICD-10-CM | POA: Diagnosis not present

## 2021-02-03 DIAGNOSIS — I951 Orthostatic hypotension: Secondary | ICD-10-CM | POA: Diagnosis not present

## 2021-02-03 DIAGNOSIS — F0390 Unspecified dementia without behavioral disturbance: Secondary | ICD-10-CM | POA: Diagnosis not present

## 2021-02-03 DIAGNOSIS — E559 Vitamin D deficiency, unspecified: Secondary | ICD-10-CM | POA: Diagnosis not present

## 2021-02-03 DIAGNOSIS — E785 Hyperlipidemia, unspecified: Secondary | ICD-10-CM | POA: Diagnosis not present

## 2021-02-03 DIAGNOSIS — I69354 Hemiplegia and hemiparesis following cerebral infarction affecting left non-dominant side: Secondary | ICD-10-CM | POA: Diagnosis not present

## 2021-02-03 DIAGNOSIS — I129 Hypertensive chronic kidney disease with stage 1 through stage 4 chronic kidney disease, or unspecified chronic kidney disease: Secondary | ICD-10-CM | POA: Diagnosis not present

## 2021-02-03 DIAGNOSIS — E876 Hypokalemia: Secondary | ICD-10-CM | POA: Diagnosis not present

## 2021-02-03 DIAGNOSIS — N183 Chronic kidney disease, stage 3 unspecified: Secondary | ICD-10-CM | POA: Diagnosis not present

## 2021-02-05 DIAGNOSIS — E1122 Type 2 diabetes mellitus with diabetic chronic kidney disease: Secondary | ICD-10-CM | POA: Diagnosis not present

## 2021-02-05 DIAGNOSIS — I129 Hypertensive chronic kidney disease with stage 1 through stage 4 chronic kidney disease, or unspecified chronic kidney disease: Secondary | ICD-10-CM | POA: Diagnosis not present

## 2021-02-05 DIAGNOSIS — F0390 Unspecified dementia without behavioral disturbance: Secondary | ICD-10-CM | POA: Diagnosis not present

## 2021-02-05 DIAGNOSIS — E876 Hypokalemia: Secondary | ICD-10-CM | POA: Diagnosis not present

## 2021-02-05 DIAGNOSIS — I951 Orthostatic hypotension: Secondary | ICD-10-CM | POA: Diagnosis not present

## 2021-02-05 DIAGNOSIS — N183 Chronic kidney disease, stage 3 unspecified: Secondary | ICD-10-CM | POA: Diagnosis not present

## 2021-02-05 DIAGNOSIS — E785 Hyperlipidemia, unspecified: Secondary | ICD-10-CM | POA: Diagnosis not present

## 2021-02-05 DIAGNOSIS — E559 Vitamin D deficiency, unspecified: Secondary | ICD-10-CM | POA: Diagnosis not present

## 2021-02-05 DIAGNOSIS — I69354 Hemiplegia and hemiparesis following cerebral infarction affecting left non-dominant side: Secondary | ICD-10-CM | POA: Diagnosis not present

## 2021-02-09 DIAGNOSIS — E785 Hyperlipidemia, unspecified: Secondary | ICD-10-CM | POA: Diagnosis not present

## 2021-02-09 DIAGNOSIS — N183 Chronic kidney disease, stage 3 unspecified: Secondary | ICD-10-CM | POA: Diagnosis not present

## 2021-02-09 DIAGNOSIS — F0390 Unspecified dementia without behavioral disturbance: Secondary | ICD-10-CM | POA: Diagnosis not present

## 2021-02-09 DIAGNOSIS — I129 Hypertensive chronic kidney disease with stage 1 through stage 4 chronic kidney disease, or unspecified chronic kidney disease: Secondary | ICD-10-CM | POA: Diagnosis not present

## 2021-02-09 DIAGNOSIS — I69354 Hemiplegia and hemiparesis following cerebral infarction affecting left non-dominant side: Secondary | ICD-10-CM | POA: Diagnosis not present

## 2021-02-09 DIAGNOSIS — E876 Hypokalemia: Secondary | ICD-10-CM | POA: Diagnosis not present

## 2021-02-09 DIAGNOSIS — E559 Vitamin D deficiency, unspecified: Secondary | ICD-10-CM | POA: Diagnosis not present

## 2021-02-09 DIAGNOSIS — E1122 Type 2 diabetes mellitus with diabetic chronic kidney disease: Secondary | ICD-10-CM | POA: Diagnosis not present

## 2021-02-09 DIAGNOSIS — I951 Orthostatic hypotension: Secondary | ICD-10-CM | POA: Diagnosis not present

## 2021-02-11 ENCOUNTER — Other Ambulatory Visit: Payer: Self-pay | Admitting: Internal Medicine

## 2021-02-11 DIAGNOSIS — I69354 Hemiplegia and hemiparesis following cerebral infarction affecting left non-dominant side: Secondary | ICD-10-CM | POA: Diagnosis not present

## 2021-02-11 DIAGNOSIS — I951 Orthostatic hypotension: Secondary | ICD-10-CM | POA: Diagnosis not present

## 2021-02-11 DIAGNOSIS — F0390 Unspecified dementia without behavioral disturbance: Secondary | ICD-10-CM | POA: Diagnosis not present

## 2021-02-11 DIAGNOSIS — E785 Hyperlipidemia, unspecified: Secondary | ICD-10-CM | POA: Diagnosis not present

## 2021-02-11 DIAGNOSIS — N3281 Overactive bladder: Secondary | ICD-10-CM

## 2021-02-11 DIAGNOSIS — E876 Hypokalemia: Secondary | ICD-10-CM | POA: Diagnosis not present

## 2021-02-11 DIAGNOSIS — N183 Chronic kidney disease, stage 3 unspecified: Secondary | ICD-10-CM | POA: Diagnosis not present

## 2021-02-11 DIAGNOSIS — I129 Hypertensive chronic kidney disease with stage 1 through stage 4 chronic kidney disease, or unspecified chronic kidney disease: Secondary | ICD-10-CM | POA: Diagnosis not present

## 2021-02-11 DIAGNOSIS — E559 Vitamin D deficiency, unspecified: Secondary | ICD-10-CM | POA: Diagnosis not present

## 2021-02-11 DIAGNOSIS — E1122 Type 2 diabetes mellitus with diabetic chronic kidney disease: Secondary | ICD-10-CM | POA: Diagnosis not present

## 2021-02-11 MED ORDER — MIRABEGRON ER 50 MG PO TB24: ORAL_TABLET | ORAL | 3 refills | Status: DC

## 2021-02-12 DIAGNOSIS — I951 Orthostatic hypotension: Secondary | ICD-10-CM | POA: Diagnosis not present

## 2021-02-12 DIAGNOSIS — E785 Hyperlipidemia, unspecified: Secondary | ICD-10-CM | POA: Diagnosis not present

## 2021-02-12 DIAGNOSIS — E876 Hypokalemia: Secondary | ICD-10-CM | POA: Diagnosis not present

## 2021-02-12 DIAGNOSIS — N183 Chronic kidney disease, stage 3 unspecified: Secondary | ICD-10-CM | POA: Diagnosis not present

## 2021-02-12 DIAGNOSIS — E559 Vitamin D deficiency, unspecified: Secondary | ICD-10-CM | POA: Diagnosis not present

## 2021-02-12 DIAGNOSIS — E1122 Type 2 diabetes mellitus with diabetic chronic kidney disease: Secondary | ICD-10-CM | POA: Diagnosis not present

## 2021-02-12 DIAGNOSIS — F0390 Unspecified dementia without behavioral disturbance: Secondary | ICD-10-CM | POA: Diagnosis not present

## 2021-02-12 DIAGNOSIS — I129 Hypertensive chronic kidney disease with stage 1 through stage 4 chronic kidney disease, or unspecified chronic kidney disease: Secondary | ICD-10-CM | POA: Diagnosis not present

## 2021-02-12 DIAGNOSIS — I69354 Hemiplegia and hemiparesis following cerebral infarction affecting left non-dominant side: Secondary | ICD-10-CM | POA: Diagnosis not present

## 2021-02-13 ENCOUNTER — Other Ambulatory Visit: Payer: Self-pay | Admitting: Internal Medicine

## 2021-02-13 DIAGNOSIS — I69354 Hemiplegia and hemiparesis following cerebral infarction affecting left non-dominant side: Secondary | ICD-10-CM | POA: Diagnosis not present

## 2021-02-13 DIAGNOSIS — F0390 Unspecified dementia without behavioral disturbance: Secondary | ICD-10-CM | POA: Diagnosis not present

## 2021-02-13 DIAGNOSIS — N3281 Overactive bladder: Secondary | ICD-10-CM

## 2021-02-13 DIAGNOSIS — E876 Hypokalemia: Secondary | ICD-10-CM | POA: Diagnosis not present

## 2021-02-13 DIAGNOSIS — E1122 Type 2 diabetes mellitus with diabetic chronic kidney disease: Secondary | ICD-10-CM | POA: Diagnosis not present

## 2021-02-13 DIAGNOSIS — N183 Chronic kidney disease, stage 3 unspecified: Secondary | ICD-10-CM | POA: Diagnosis not present

## 2021-02-13 DIAGNOSIS — I129 Hypertensive chronic kidney disease with stage 1 through stage 4 chronic kidney disease, or unspecified chronic kidney disease: Secondary | ICD-10-CM | POA: Diagnosis not present

## 2021-02-13 DIAGNOSIS — E559 Vitamin D deficiency, unspecified: Secondary | ICD-10-CM | POA: Diagnosis not present

## 2021-02-13 DIAGNOSIS — E785 Hyperlipidemia, unspecified: Secondary | ICD-10-CM | POA: Diagnosis not present

## 2021-02-13 DIAGNOSIS — I951 Orthostatic hypotension: Secondary | ICD-10-CM | POA: Diagnosis not present

## 2021-02-13 MED ORDER — MIRABEGRON ER 50 MG PO TB24: ORAL_TABLET | ORAL | 3 refills | Status: DC

## 2021-02-16 DIAGNOSIS — E1122 Type 2 diabetes mellitus with diabetic chronic kidney disease: Secondary | ICD-10-CM | POA: Diagnosis not present

## 2021-02-16 DIAGNOSIS — I129 Hypertensive chronic kidney disease with stage 1 through stage 4 chronic kidney disease, or unspecified chronic kidney disease: Secondary | ICD-10-CM | POA: Diagnosis not present

## 2021-02-16 DIAGNOSIS — I951 Orthostatic hypotension: Secondary | ICD-10-CM | POA: Diagnosis not present

## 2021-02-16 DIAGNOSIS — N183 Chronic kidney disease, stage 3 unspecified: Secondary | ICD-10-CM | POA: Diagnosis not present

## 2021-02-16 DIAGNOSIS — E785 Hyperlipidemia, unspecified: Secondary | ICD-10-CM | POA: Diagnosis not present

## 2021-02-16 DIAGNOSIS — E876 Hypokalemia: Secondary | ICD-10-CM | POA: Diagnosis not present

## 2021-02-16 DIAGNOSIS — I69354 Hemiplegia and hemiparesis following cerebral infarction affecting left non-dominant side: Secondary | ICD-10-CM | POA: Diagnosis not present

## 2021-02-16 DIAGNOSIS — E559 Vitamin D deficiency, unspecified: Secondary | ICD-10-CM | POA: Diagnosis not present

## 2021-02-16 DIAGNOSIS — F0390 Unspecified dementia without behavioral disturbance: Secondary | ICD-10-CM | POA: Diagnosis not present

## 2021-02-18 DIAGNOSIS — I951 Orthostatic hypotension: Secondary | ICD-10-CM | POA: Diagnosis not present

## 2021-02-18 DIAGNOSIS — E559 Vitamin D deficiency, unspecified: Secondary | ICD-10-CM | POA: Diagnosis not present

## 2021-02-18 DIAGNOSIS — E1122 Type 2 diabetes mellitus with diabetic chronic kidney disease: Secondary | ICD-10-CM | POA: Diagnosis not present

## 2021-02-18 DIAGNOSIS — E785 Hyperlipidemia, unspecified: Secondary | ICD-10-CM | POA: Diagnosis not present

## 2021-02-18 DIAGNOSIS — F0390 Unspecified dementia without behavioral disturbance: Secondary | ICD-10-CM | POA: Diagnosis not present

## 2021-02-18 DIAGNOSIS — I129 Hypertensive chronic kidney disease with stage 1 through stage 4 chronic kidney disease, or unspecified chronic kidney disease: Secondary | ICD-10-CM | POA: Diagnosis not present

## 2021-02-18 DIAGNOSIS — E876 Hypokalemia: Secondary | ICD-10-CM | POA: Diagnosis not present

## 2021-02-18 DIAGNOSIS — N183 Chronic kidney disease, stage 3 unspecified: Secondary | ICD-10-CM | POA: Diagnosis not present

## 2021-02-18 DIAGNOSIS — I69354 Hemiplegia and hemiparesis following cerebral infarction affecting left non-dominant side: Secondary | ICD-10-CM | POA: Diagnosis not present

## 2021-02-19 DIAGNOSIS — E559 Vitamin D deficiency, unspecified: Secondary | ICD-10-CM | POA: Diagnosis not present

## 2021-02-19 DIAGNOSIS — I69354 Hemiplegia and hemiparesis following cerebral infarction affecting left non-dominant side: Secondary | ICD-10-CM | POA: Diagnosis not present

## 2021-02-19 DIAGNOSIS — F0390 Unspecified dementia without behavioral disturbance: Secondary | ICD-10-CM | POA: Diagnosis not present

## 2021-02-19 DIAGNOSIS — N183 Chronic kidney disease, stage 3 unspecified: Secondary | ICD-10-CM | POA: Diagnosis not present

## 2021-02-19 DIAGNOSIS — I129 Hypertensive chronic kidney disease with stage 1 through stage 4 chronic kidney disease, or unspecified chronic kidney disease: Secondary | ICD-10-CM | POA: Diagnosis not present

## 2021-02-19 DIAGNOSIS — E876 Hypokalemia: Secondary | ICD-10-CM | POA: Diagnosis not present

## 2021-02-19 DIAGNOSIS — E785 Hyperlipidemia, unspecified: Secondary | ICD-10-CM | POA: Diagnosis not present

## 2021-02-19 DIAGNOSIS — I951 Orthostatic hypotension: Secondary | ICD-10-CM | POA: Diagnosis not present

## 2021-02-19 DIAGNOSIS — E1122 Type 2 diabetes mellitus with diabetic chronic kidney disease: Secondary | ICD-10-CM | POA: Diagnosis not present

## 2021-02-23 ENCOUNTER — Other Ambulatory Visit: Payer: Self-pay | Admitting: Neurology

## 2021-02-23 DIAGNOSIS — I951 Orthostatic hypotension: Secondary | ICD-10-CM | POA: Diagnosis not present

## 2021-02-23 DIAGNOSIS — N183 Chronic kidney disease, stage 3 unspecified: Secondary | ICD-10-CM | POA: Diagnosis not present

## 2021-02-23 DIAGNOSIS — E876 Hypokalemia: Secondary | ICD-10-CM | POA: Diagnosis not present

## 2021-02-23 DIAGNOSIS — E559 Vitamin D deficiency, unspecified: Secondary | ICD-10-CM | POA: Diagnosis not present

## 2021-02-23 DIAGNOSIS — E785 Hyperlipidemia, unspecified: Secondary | ICD-10-CM | POA: Diagnosis not present

## 2021-02-23 DIAGNOSIS — I129 Hypertensive chronic kidney disease with stage 1 through stage 4 chronic kidney disease, or unspecified chronic kidney disease: Secondary | ICD-10-CM | POA: Diagnosis not present

## 2021-02-23 DIAGNOSIS — I69354 Hemiplegia and hemiparesis following cerebral infarction affecting left non-dominant side: Secondary | ICD-10-CM | POA: Diagnosis not present

## 2021-02-23 DIAGNOSIS — F0390 Unspecified dementia without behavioral disturbance: Secondary | ICD-10-CM | POA: Diagnosis not present

## 2021-02-23 DIAGNOSIS — E1122 Type 2 diabetes mellitus with diabetic chronic kidney disease: Secondary | ICD-10-CM | POA: Diagnosis not present

## 2021-02-23 MED ORDER — BELSOMRA 15 MG PO TABS: 15.0000 mg | ORAL_TABLET | Freq: Every day | ORAL | 1 refills | Status: DC

## 2021-02-26 DIAGNOSIS — E1122 Type 2 diabetes mellitus with diabetic chronic kidney disease: Secondary | ICD-10-CM | POA: Diagnosis not present

## 2021-02-26 DIAGNOSIS — I951 Orthostatic hypotension: Secondary | ICD-10-CM | POA: Diagnosis not present

## 2021-02-26 DIAGNOSIS — E876 Hypokalemia: Secondary | ICD-10-CM | POA: Diagnosis not present

## 2021-02-26 DIAGNOSIS — F0390 Unspecified dementia without behavioral disturbance: Secondary | ICD-10-CM | POA: Diagnosis not present

## 2021-02-26 DIAGNOSIS — E559 Vitamin D deficiency, unspecified: Secondary | ICD-10-CM | POA: Diagnosis not present

## 2021-02-26 DIAGNOSIS — E785 Hyperlipidemia, unspecified: Secondary | ICD-10-CM | POA: Diagnosis not present

## 2021-02-26 DIAGNOSIS — N183 Chronic kidney disease, stage 3 unspecified: Secondary | ICD-10-CM | POA: Diagnosis not present

## 2021-02-26 DIAGNOSIS — I69354 Hemiplegia and hemiparesis following cerebral infarction affecting left non-dominant side: Secondary | ICD-10-CM | POA: Diagnosis not present

## 2021-02-26 DIAGNOSIS — I129 Hypertensive chronic kidney disease with stage 1 through stage 4 chronic kidney disease, or unspecified chronic kidney disease: Secondary | ICD-10-CM | POA: Diagnosis not present

## 2021-02-28 DIAGNOSIS — I69354 Hemiplegia and hemiparesis following cerebral infarction affecting left non-dominant side: Secondary | ICD-10-CM | POA: Diagnosis not present

## 2021-03-02 DIAGNOSIS — I69354 Hemiplegia and hemiparesis following cerebral infarction affecting left non-dominant side: Secondary | ICD-10-CM | POA: Diagnosis not present

## 2021-03-02 DIAGNOSIS — E785 Hyperlipidemia, unspecified: Secondary | ICD-10-CM | POA: Diagnosis not present

## 2021-03-02 DIAGNOSIS — F0390 Unspecified dementia without behavioral disturbance: Secondary | ICD-10-CM | POA: Diagnosis not present

## 2021-03-02 DIAGNOSIS — I129 Hypertensive chronic kidney disease with stage 1 through stage 4 chronic kidney disease, or unspecified chronic kidney disease: Secondary | ICD-10-CM | POA: Diagnosis not present

## 2021-03-02 DIAGNOSIS — E559 Vitamin D deficiency, unspecified: Secondary | ICD-10-CM | POA: Diagnosis not present

## 2021-03-02 DIAGNOSIS — N183 Chronic kidney disease, stage 3 unspecified: Secondary | ICD-10-CM | POA: Diagnosis not present

## 2021-03-02 DIAGNOSIS — I951 Orthostatic hypotension: Secondary | ICD-10-CM | POA: Diagnosis not present

## 2021-03-02 DIAGNOSIS — E1122 Type 2 diabetes mellitus with diabetic chronic kidney disease: Secondary | ICD-10-CM | POA: Diagnosis not present

## 2021-03-02 DIAGNOSIS — E876 Hypokalemia: Secondary | ICD-10-CM | POA: Diagnosis not present

## 2021-03-03 DIAGNOSIS — F0390 Unspecified dementia without behavioral disturbance: Secondary | ICD-10-CM | POA: Diagnosis not present

## 2021-03-03 DIAGNOSIS — E785 Hyperlipidemia, unspecified: Secondary | ICD-10-CM | POA: Diagnosis not present

## 2021-03-03 DIAGNOSIS — I69354 Hemiplegia and hemiparesis following cerebral infarction affecting left non-dominant side: Secondary | ICD-10-CM | POA: Diagnosis not present

## 2021-03-03 DIAGNOSIS — E1122 Type 2 diabetes mellitus with diabetic chronic kidney disease: Secondary | ICD-10-CM | POA: Diagnosis not present

## 2021-03-03 DIAGNOSIS — I129 Hypertensive chronic kidney disease with stage 1 through stage 4 chronic kidney disease, or unspecified chronic kidney disease: Secondary | ICD-10-CM | POA: Diagnosis not present

## 2021-03-03 DIAGNOSIS — E559 Vitamin D deficiency, unspecified: Secondary | ICD-10-CM | POA: Diagnosis not present

## 2021-03-03 DIAGNOSIS — I951 Orthostatic hypotension: Secondary | ICD-10-CM | POA: Diagnosis not present

## 2021-03-03 DIAGNOSIS — E876 Hypokalemia: Secondary | ICD-10-CM | POA: Diagnosis not present

## 2021-03-03 DIAGNOSIS — N183 Chronic kidney disease, stage 3 unspecified: Secondary | ICD-10-CM | POA: Diagnosis not present

## 2021-03-04 ENCOUNTER — Other Ambulatory Visit: Payer: Self-pay | Admitting: Neurology

## 2021-03-04 MED ORDER — BELSOMRA 15 MG PO TABS: 15.0000 mg | ORAL_TABLET | Freq: Every day | ORAL | 0 refills | Status: DC

## 2021-03-05 ENCOUNTER — Other Ambulatory Visit: Payer: Self-pay | Admitting: Neurology

## 2021-03-05 MED ORDER — BELSOMRA 15 MG PO TABS: 15.0000 mg | ORAL_TABLET | Freq: Every day | ORAL | 0 refills | Status: DC

## 2021-03-09 DIAGNOSIS — N183 Chronic kidney disease, stage 3 unspecified: Secondary | ICD-10-CM | POA: Diagnosis not present

## 2021-03-09 DIAGNOSIS — E1122 Type 2 diabetes mellitus with diabetic chronic kidney disease: Secondary | ICD-10-CM | POA: Diagnosis not present

## 2021-03-09 DIAGNOSIS — I951 Orthostatic hypotension: Secondary | ICD-10-CM | POA: Diagnosis not present

## 2021-03-09 DIAGNOSIS — E785 Hyperlipidemia, unspecified: Secondary | ICD-10-CM | POA: Diagnosis not present

## 2021-03-09 DIAGNOSIS — F0390 Unspecified dementia without behavioral disturbance: Secondary | ICD-10-CM | POA: Diagnosis not present

## 2021-03-09 DIAGNOSIS — E876 Hypokalemia: Secondary | ICD-10-CM | POA: Diagnosis not present

## 2021-03-09 DIAGNOSIS — E559 Vitamin D deficiency, unspecified: Secondary | ICD-10-CM | POA: Diagnosis not present

## 2021-03-09 DIAGNOSIS — I69354 Hemiplegia and hemiparesis following cerebral infarction affecting left non-dominant side: Secondary | ICD-10-CM | POA: Diagnosis not present

## 2021-03-09 DIAGNOSIS — I129 Hypertensive chronic kidney disease with stage 1 through stage 4 chronic kidney disease, or unspecified chronic kidney disease: Secondary | ICD-10-CM | POA: Diagnosis not present

## 2021-03-10 ENCOUNTER — Other Ambulatory Visit: Payer: Self-pay

## 2021-03-10 ENCOUNTER — Encounter: Payer: Self-pay | Admitting: Cardiology

## 2021-03-10 ENCOUNTER — Telehealth (INDEPENDENT_AMBULATORY_CARE_PROVIDER_SITE_OTHER): Payer: Medicare HMO | Admitting: Cardiology

## 2021-03-10 VITALS — BP 104/62 | HR 60 | Temp 97.0°F | Ht 74.0 in | Wt 160.0 lb

## 2021-03-10 DIAGNOSIS — E559 Vitamin D deficiency, unspecified: Secondary | ICD-10-CM | POA: Diagnosis not present

## 2021-03-10 DIAGNOSIS — I10 Essential (primary) hypertension: Secondary | ICD-10-CM

## 2021-03-10 DIAGNOSIS — R9431 Abnormal electrocardiogram [ECG] [EKG]: Secondary | ICD-10-CM

## 2021-03-10 DIAGNOSIS — I129 Hypertensive chronic kidney disease with stage 1 through stage 4 chronic kidney disease, or unspecified chronic kidney disease: Secondary | ICD-10-CM | POA: Diagnosis not present

## 2021-03-10 DIAGNOSIS — F0390 Unspecified dementia without behavioral disturbance: Secondary | ICD-10-CM | POA: Diagnosis not present

## 2021-03-10 DIAGNOSIS — E1122 Type 2 diabetes mellitus with diabetic chronic kidney disease: Secondary | ICD-10-CM | POA: Diagnosis not present

## 2021-03-10 DIAGNOSIS — I69354 Hemiplegia and hemiparesis following cerebral infarction affecting left non-dominant side: Secondary | ICD-10-CM | POA: Diagnosis not present

## 2021-03-10 DIAGNOSIS — E876 Hypokalemia: Secondary | ICD-10-CM | POA: Diagnosis not present

## 2021-03-10 DIAGNOSIS — I951 Orthostatic hypotension: Secondary | ICD-10-CM | POA: Diagnosis not present

## 2021-03-10 DIAGNOSIS — N183 Chronic kidney disease, stage 3 unspecified: Secondary | ICD-10-CM | POA: Diagnosis not present

## 2021-03-10 DIAGNOSIS — E785 Hyperlipidemia, unspecified: Secondary | ICD-10-CM | POA: Diagnosis not present

## 2021-03-10 NOTE — Patient Instructions (Signed)
Medication Instructions:  Your physician recommends that you continue on your current medications as directed. Please refer to the Current Medication list given to you today.  *If you need a refill on your cardiac medications before your next appointment, please call your pharmacy*  Follow-Up: At [facility], you and your health needs are our priority.  As part of our continuing mission to provide you with exceptional heart care, we have created designated Provider Care Teams.  These Care Teams include your primary Cardiologist (physician) and Advanced Practice Providers (APPs -  Physician Assistants and Nurse Practitioners) who all work together to provide you with the care you need, when you need it.  Your next appointment:   3 month(s)  The format for your next appointment:   In Person or Virtual   Provider:   You may see Fransico Him, MD or one of the following Advanced Practice Providers on your designated Care Team:    Melina Copa, PA-C  Ermalinda Barrios, PA-C

## 2021-03-10 NOTE — Progress Notes (Signed)
Virtual Visit via Video Note   This visit type was conducted due to national recommendations for restrictions regarding the COVID-19 Pandemic (e.g. social distancing) in an effort to limit this patient's exposure and mitigate transmission in our community.  Due to his co-morbid illnesses, this patient is at least at moderate risk for complications without adequate follow up.  This format is felt to be most appropriate for this patient at this time.  All issues noted in this document were discussed and addressed.  A limited physical exam was performed with this format.  Please refer to the patient's chart for his consent to telehealth for [facility].  Date:  03/10/2021   ID:  Mathew Phi., DOB Apr 30, 1946, MRN [id number] The patient was identified using 2 identifiers.  Patient Location: Home Provider Location: Home Office  PCP:  Mathew Pinto, MD  Cardiologist:  Mathew Him, MD  Electrophysiologist:  None   Evaluation Performed:  Follow-Up Visit  Chief Complaint:  Orthostatic hypotension and Syncope  History of Present Illness:    Mathew Seales. is a 75 y.o. male with a hx of HTN, HLD, Vit D def and vascular dementia.  He has a hx of ischemic frontal lobe CVA in Sept 2021 and was in SNF for a month.  He has has issues with HTN as well as orthostatic hypotension.  He was on midodrine and Florinef which had to be stopped due to HTN and Hydralazine was added to help with his elevated BP. Recently he has had more problems with postural hypotension and his Florinef was restarted by his PCP.   He was seen in initial evaluation by me for syncope and multiple episodes of orthostatic hypotension on 10/29/2020 and was markedly orthostatic with a drop in SBP from 141 to 1mmHg but has low as 77mmHg at home per his wife with syncope.  2D echo was normal in Sept 2021.  At that OV his amlodipine, Hydralazine and Seroquel were stopped.  His florinef was also stopped due to problems with  supine HTN.  He was started on Losartan 25mg  daily.  He was started on Mestinon 30mg  BID and TED hose changed to thigh high.  At that time I was very concerned that he could have a neurodegenerative disorder given recent development of severe dysautonomia and rapidly progressive dementia and I referred Miller to Neuro.    When I saw Miller last he had continued to have numerous episodes of syncope associated with marked drops in BP and had had 3 episodes of syncope.  He was seen by Neuro and felt to possibly have multiple system atrophy with eye tracking, bradykinesia, cogwheel rigidity and cognitive decline but given his predominant autonomic dysfunction out of proportion to other systems this was felt not to be due to Parkinson's or supranuclear palsy.  He was started on Donepezil 5mg  daily with increase to 10mg  daily 1 month later.    He was referred to Dr. Caryl Miller to see if there was any other treatment options.  He changed Miller to thigh high compression hose and decreased the size of his abdominal binder to 8 inches.  His Pyridostigmine was changed to dosing at 9am and 3pm to help during his waking hours.    He is now here for followup with his wife as the interpreter for the interview.  She says that he has not had any problems with his BP being elevated.  He has still had episodes of BP dropping as low as  60/54mmHg.  Since May 1st his BP has dropped 4 times into the 81-82'X systolic.  He is wearing a 6" abdominal binder which he does not like.  She says that 2 times he passed out but did not have the abdominal binder on except for yesterday.  Also PT recommended that he be up out of bed with the abdominal binder on for at least 60 minutes prior to working with Miller.  With the abdominal binder on and hose his BP is usually in the 937'J systolic.  He denies any CP, SOB, LE edema.    The patient does not have symptoms concerning for COVID-19 infection (fever, chills, cough, or new shortness of breath).   Past  Medical History:  Diagnosis Date  . Constipation   . Hyperlipidemia   . Hypertension   . Prediabetes    no meds   . Vitamin D deficiency    Past Surgical History:  Procedure Laterality Date  . CATARACT EXTRACTION, BILATERAL  2017  . COLONOSCOPY    . NO PAST SURGERIES       Current Meds  Medication Sig  . aspirin EC 81 MG tablet Take 81 mg by mouth daily. Swallow whole.  . cholecalciferol (VITAMIN D3) 25 MCG (1000 UNIT) tablet Take 5,000 Units by mouth daily.  Marland Kitchen donepezil (ARICEPT) 10 MG tablet Take 1 tablet (10 mg total) by mouth at bedtime. (Patient taking differently: Take 5 mg by mouth at bedtime.)  . loratadine (ALLERGY) 10 MG tablet Take 10 mg by mouth daily.  Marland Kitchen losartan (COZAAR) 50 MG tablet Take 1.5 tablets (75 mg total) by mouth daily.  . Magnesium 250 MG TABS Take 250 mg by mouth daily.  . mirabegron ER (MYRBETRIQ) 50 MG TB24 tablet Take  1 tablet  Daily  for Bladder  . polyethylene glycol (MIRALAX / GLYCOLAX) 17 g packet Take 17 g by mouth daily.  . potassium chloride SA (KLOR-CON) 20 MEQ tablet TAKE 1 TABLET TWICE DAILY  (FOR POTASSIUM REPLACEMENT)  . pyridostigmine (MESTINON) 60 MG tablet Take 1 tablet (60 mg total) by mouth in the morning and at bedtime.  . rosuvastatin (CRESTOR) 5 MG tablet TAKE 1 TABLET EVERY DAY FOR CHOLESTEROL  . senna-docusate (GNP STOOL SOFTENER/LAXATIVE) 8.6-50 MG tablet Take 1 tablet Daily for Constipation  . Suvorexant (BELSOMRA) 15 MG TABS Take 15 mg by mouth at bedtime.     Allergies:   Hydrocodone-acetaminophen, Sudafed [pseudoephedrine hcl], and Tetanus toxoids   Social History   Tobacco Use  . Smoking status: Never Smoker  . Smokeless tobacco: Never Used  Vaping Use  . Vaping Use: Never used  Substance Use Topics  . Alcohol use: Yes    Alcohol/week: 0.0 standard drinks    Comment: very Rare /Beer.wine.  . Drug use: No     Family Hx: The patient's family history includes Glaucoma in his mother; Heart disease in his father;  Hyperlipidemia in his mother; Hypertension in his father. There is no history of Colon cancer, Esophageal cancer, Pancreatic cancer, Prostate cancer, Rectal cancer, or Stomach cancer.  ROS:   Please see the history of present illness.     All other systems reviewed and are negative.   Prior CV studies:   The following studies were reviewed today:  none  Labs/Other Tests and Data Reviewed:    EKG:  No ECG reviewed.  Recent Labs: 01/20/2021: ALT 20; BUN 26; Creat 1.41; Hemoglobin 13.1; Magnesium 2.1; Platelets 219; Potassium 4.2; Sodium 141; TSH 4.00  Recent Lipid Panel Lab Results  Component Value Date/Time   CHOL 142 01/20/2021 11:31 AM   TRIG 68 01/20/2021 11:31 AM   HDL 60 01/20/2021 11:31 AM   CHOLHDL 2.4 01/20/2021 11:31 AM   LDLCALC 68 01/20/2021 11:31 AM    Wt Readings from Last 3 Encounters:  03/10/21 160 lb (72.6 kg)  01/20/21 147 lb (66.7 kg)  01/12/21 147 lb (66.7 kg)     Risk Assessment/Calculations:      Objective:    Vital Signs:  BP 104/62   Pulse 60   Temp (!) 97 F (36.1 C)   Ht 6\' 2"  (1.88 m)   Wt 160 lb (72.6 kg)   BMI 20.54 kg/m    Patient sitting up in recliner  ASSESSMENT & PLAN:    1.  Orthostatic hypotension/Syncope -he has had multiple syncopal episodes with sitting and standing and profound orthostatic hypotension -2D echo normal in Sept 2021 -several drugs that would potentiate orthostasis including amlodipine (peripheral vasodilator), hydralazine (alpha blocker) and Seroquel and these were all stopped -I referred Miller to Neuro as I was concerned he may have a neurodegenerative disorder and he was dx with multisystem atrophy with profound autonomic dysfunction -he has had multiple episodes of syncope with sitting or standing  -started on Mestinon and not up to 60mg  BID -Seen by neuro and felt to have multisystem atrophy and started on Donepezil -was seen by Dr. Caryl Miller and has switched to a 6" abdominal binder and thigh high sleeves  and metstinon changed to q9am and q3pm.  -he has not really had any problems with high BPs recently -he has had 4 episodes of drops in Bp in the past months and 3/4 he did not have his abdominal binder on -I have encouraged her to put the binder on as well as the thigh sleeves prior to getting out of bed and needs to be up for at least an hour before PT works with Miller -Continue prescription drug management with Mestinon 50mg  q9am and q3pm  2.  HTN -BP is has been better controlled recently with very few high readings -Continue prescription drug management with Losartan 75mg  daily  3.  Prolonged QT -his QTc is prolonged in Oct and likely related to Seroquel which has been stopped -seroquel stopped -he was placed on trazodone for sleep which has not helped at all and he has been up nightly throughout the night -he is now on Belsoma for sleep  COVID-19 Education: The signs and symptoms of COVID-19 were discussed with the patient and how to seek care for testing (follow up with PCP or arrange E-visit).  The importance of social distancing was discussed today.  Time:   Today, I have spent 15 minutes with the patient with telehealth technology discussing the above problems.     Medication Adjustments/Labs and Tests Ordered: Current medicines are reviewed at length with the patient today.  Concerns regarding medicines are outlined above.   Tests Ordered: No orders of the defined types were placed in this encounter.   Medication Changes: No orders of the defined types were placed in this encounter.   Follow SH:FWYOV in 3 months  Signed, Mathew Him, MD  03/10/2021 10:35 AM    [place]

## 2021-03-17 DIAGNOSIS — I951 Orthostatic hypotension: Secondary | ICD-10-CM | POA: Diagnosis not present

## 2021-03-17 DIAGNOSIS — N183 Chronic kidney disease, stage 3 unspecified: Secondary | ICD-10-CM | POA: Diagnosis not present

## 2021-03-17 DIAGNOSIS — I129 Hypertensive chronic kidney disease with stage 1 through stage 4 chronic kidney disease, or unspecified chronic kidney disease: Secondary | ICD-10-CM | POA: Diagnosis not present

## 2021-03-17 DIAGNOSIS — F0390 Unspecified dementia without behavioral disturbance: Secondary | ICD-10-CM | POA: Diagnosis not present

## 2021-03-17 DIAGNOSIS — E559 Vitamin D deficiency, unspecified: Secondary | ICD-10-CM | POA: Diagnosis not present

## 2021-03-17 DIAGNOSIS — E1122 Type 2 diabetes mellitus with diabetic chronic kidney disease: Secondary | ICD-10-CM | POA: Diagnosis not present

## 2021-03-17 DIAGNOSIS — E876 Hypokalemia: Secondary | ICD-10-CM | POA: Diagnosis not present

## 2021-03-17 DIAGNOSIS — E785 Hyperlipidemia, unspecified: Secondary | ICD-10-CM | POA: Diagnosis not present

## 2021-03-17 DIAGNOSIS — I69354 Hemiplegia and hemiparesis following cerebral infarction affecting left non-dominant side: Secondary | ICD-10-CM | POA: Diagnosis not present

## 2021-03-18 DIAGNOSIS — I69354 Hemiplegia and hemiparesis following cerebral infarction affecting left non-dominant side: Secondary | ICD-10-CM | POA: Diagnosis not present

## 2021-03-18 DIAGNOSIS — E876 Hypokalemia: Secondary | ICD-10-CM | POA: Diagnosis not present

## 2021-03-18 DIAGNOSIS — I129 Hypertensive chronic kidney disease with stage 1 through stage 4 chronic kidney disease, or unspecified chronic kidney disease: Secondary | ICD-10-CM | POA: Diagnosis not present

## 2021-03-18 DIAGNOSIS — N183 Chronic kidney disease, stage 3 unspecified: Secondary | ICD-10-CM | POA: Diagnosis not present

## 2021-03-18 DIAGNOSIS — E785 Hyperlipidemia, unspecified: Secondary | ICD-10-CM | POA: Diagnosis not present

## 2021-03-18 DIAGNOSIS — E559 Vitamin D deficiency, unspecified: Secondary | ICD-10-CM | POA: Diagnosis not present

## 2021-03-18 DIAGNOSIS — F0390 Unspecified dementia without behavioral disturbance: Secondary | ICD-10-CM | POA: Diagnosis not present

## 2021-03-18 DIAGNOSIS — I951 Orthostatic hypotension: Secondary | ICD-10-CM | POA: Diagnosis not present

## 2021-03-18 DIAGNOSIS — E1122 Type 2 diabetes mellitus with diabetic chronic kidney disease: Secondary | ICD-10-CM | POA: Diagnosis not present

## 2021-03-23 MED ORDER — LOSARTAN POTASSIUM 25 MG PO TABS: 25.0000 mg | ORAL_TABLET | Freq: Every day | ORAL | 3 refills | Status: DC

## 2021-03-24 ENCOUNTER — Telehealth: Payer: Self-pay

## 2021-03-24 DIAGNOSIS — N183 Chronic kidney disease, stage 3 unspecified: Secondary | ICD-10-CM | POA: Diagnosis not present

## 2021-03-24 DIAGNOSIS — E1122 Type 2 diabetes mellitus with diabetic chronic kidney disease: Secondary | ICD-10-CM | POA: Diagnosis not present

## 2021-03-24 DIAGNOSIS — E785 Hyperlipidemia, unspecified: Secondary | ICD-10-CM | POA: Diagnosis not present

## 2021-03-24 DIAGNOSIS — F0390 Unspecified dementia without behavioral disturbance: Secondary | ICD-10-CM | POA: Diagnosis not present

## 2021-03-24 DIAGNOSIS — E876 Hypokalemia: Secondary | ICD-10-CM | POA: Diagnosis not present

## 2021-03-24 DIAGNOSIS — I951 Orthostatic hypotension: Secondary | ICD-10-CM | POA: Diagnosis not present

## 2021-03-24 DIAGNOSIS — I69354 Hemiplegia and hemiparesis following cerebral infarction affecting left non-dominant side: Secondary | ICD-10-CM | POA: Diagnosis not present

## 2021-03-24 DIAGNOSIS — I129 Hypertensive chronic kidney disease with stage 1 through stage 4 chronic kidney disease, or unspecified chronic kidney disease: Secondary | ICD-10-CM | POA: Diagnosis not present

## 2021-03-24 DIAGNOSIS — E559 Vitamin D deficiency, unspecified: Secondary | ICD-10-CM | POA: Diagnosis not present

## 2021-03-26 DIAGNOSIS — F0390 Unspecified dementia without behavioral disturbance: Secondary | ICD-10-CM | POA: Diagnosis not present

## 2021-03-26 DIAGNOSIS — I129 Hypertensive chronic kidney disease with stage 1 through stage 4 chronic kidney disease, or unspecified chronic kidney disease: Secondary | ICD-10-CM | POA: Diagnosis not present

## 2021-03-26 DIAGNOSIS — I69354 Hemiplegia and hemiparesis following cerebral infarction affecting left non-dominant side: Secondary | ICD-10-CM | POA: Diagnosis not present

## 2021-03-26 DIAGNOSIS — I951 Orthostatic hypotension: Secondary | ICD-10-CM | POA: Diagnosis not present

## 2021-03-26 DIAGNOSIS — E785 Hyperlipidemia, unspecified: Secondary | ICD-10-CM | POA: Diagnosis not present

## 2021-03-26 DIAGNOSIS — E559 Vitamin D deficiency, unspecified: Secondary | ICD-10-CM | POA: Diagnosis not present

## 2021-03-26 DIAGNOSIS — E1122 Type 2 diabetes mellitus with diabetic chronic kidney disease: Secondary | ICD-10-CM | POA: Diagnosis not present

## 2021-03-26 DIAGNOSIS — N183 Chronic kidney disease, stage 3 unspecified: Secondary | ICD-10-CM | POA: Diagnosis not present

## 2021-03-26 DIAGNOSIS — E876 Hypokalemia: Secondary | ICD-10-CM | POA: Diagnosis not present

## 2021-03-26 NOTE — Addendum Note (Signed)
Addended by: Antonieta Iba on: 03/26/2021 12:51 PM   Modules accepted: Orders

## 2021-03-27 NOTE — Patient Outreach (Signed)
[facility] - Mrs. Greenfeld would like program information to be sent to home address.

## 2021-03-31 DIAGNOSIS — I69354 Hemiplegia and hemiparesis following cerebral infarction affecting left non-dominant side: Secondary | ICD-10-CM | POA: Diagnosis not present

## 2021-04-02 DIAGNOSIS — I69354 Hemiplegia and hemiparesis following cerebral infarction affecting left non-dominant side: Secondary | ICD-10-CM | POA: Diagnosis not present

## 2021-04-02 DIAGNOSIS — N183 Chronic kidney disease, stage 3 unspecified: Secondary | ICD-10-CM | POA: Diagnosis not present

## 2021-04-02 DIAGNOSIS — I129 Hypertensive chronic kidney disease with stage 1 through stage 4 chronic kidney disease, or unspecified chronic kidney disease: Secondary | ICD-10-CM | POA: Diagnosis not present

## 2021-04-02 DIAGNOSIS — E785 Hyperlipidemia, unspecified: Secondary | ICD-10-CM | POA: Diagnosis not present

## 2021-04-02 DIAGNOSIS — E1122 Type 2 diabetes mellitus with diabetic chronic kidney disease: Secondary | ICD-10-CM | POA: Diagnosis not present

## 2021-04-02 DIAGNOSIS — I951 Orthostatic hypotension: Secondary | ICD-10-CM | POA: Diagnosis not present

## 2021-04-02 DIAGNOSIS — E876 Hypokalemia: Secondary | ICD-10-CM | POA: Diagnosis not present

## 2021-04-02 DIAGNOSIS — E559 Vitamin D deficiency, unspecified: Secondary | ICD-10-CM | POA: Diagnosis not present

## 2021-04-02 DIAGNOSIS — F0390 Unspecified dementia without behavioral disturbance: Secondary | ICD-10-CM | POA: Diagnosis not present

## 2021-04-06 DIAGNOSIS — N183 Chronic kidney disease, stage 3 unspecified: Secondary | ICD-10-CM | POA: Diagnosis not present

## 2021-04-06 DIAGNOSIS — F0390 Unspecified dementia without behavioral disturbance: Secondary | ICD-10-CM | POA: Diagnosis not present

## 2021-04-06 DIAGNOSIS — I69354 Hemiplegia and hemiparesis following cerebral infarction affecting left non-dominant side: Secondary | ICD-10-CM | POA: Diagnosis not present

## 2021-04-06 DIAGNOSIS — I951 Orthostatic hypotension: Secondary | ICD-10-CM | POA: Diagnosis not present

## 2021-04-06 DIAGNOSIS — I129 Hypertensive chronic kidney disease with stage 1 through stage 4 chronic kidney disease, or unspecified chronic kidney disease: Secondary | ICD-10-CM | POA: Diagnosis not present

## 2021-04-06 DIAGNOSIS — E785 Hyperlipidemia, unspecified: Secondary | ICD-10-CM | POA: Diagnosis not present

## 2021-04-06 DIAGNOSIS — E559 Vitamin D deficiency, unspecified: Secondary | ICD-10-CM | POA: Diagnosis not present

## 2021-04-06 DIAGNOSIS — E876 Hypokalemia: Secondary | ICD-10-CM | POA: Diagnosis not present

## 2021-04-06 DIAGNOSIS — E1122 Type 2 diabetes mellitus with diabetic chronic kidney disease: Secondary | ICD-10-CM | POA: Diagnosis not present

## 2021-04-09 DIAGNOSIS — F0390 Unspecified dementia without behavioral disturbance: Secondary | ICD-10-CM | POA: Diagnosis not present

## 2021-04-09 DIAGNOSIS — I951 Orthostatic hypotension: Secondary | ICD-10-CM | POA: Diagnosis not present

## 2021-04-09 DIAGNOSIS — E559 Vitamin D deficiency, unspecified: Secondary | ICD-10-CM | POA: Diagnosis not present

## 2021-04-09 DIAGNOSIS — N183 Chronic kidney disease, stage 3 unspecified: Secondary | ICD-10-CM | POA: Diagnosis not present

## 2021-04-09 DIAGNOSIS — E785 Hyperlipidemia, unspecified: Secondary | ICD-10-CM | POA: Diagnosis not present

## 2021-04-09 DIAGNOSIS — E1122 Type 2 diabetes mellitus with diabetic chronic kidney disease: Secondary | ICD-10-CM | POA: Diagnosis not present

## 2021-04-09 DIAGNOSIS — E876 Hypokalemia: Secondary | ICD-10-CM | POA: Diagnosis not present

## 2021-04-09 DIAGNOSIS — I69354 Hemiplegia and hemiparesis following cerebral infarction affecting left non-dominant side: Secondary | ICD-10-CM | POA: Diagnosis not present

## 2021-04-09 DIAGNOSIS — I129 Hypertensive chronic kidney disease with stage 1 through stage 4 chronic kidney disease, or unspecified chronic kidney disease: Secondary | ICD-10-CM | POA: Diagnosis not present

## 2021-04-13 DIAGNOSIS — I129 Hypertensive chronic kidney disease with stage 1 through stage 4 chronic kidney disease, or unspecified chronic kidney disease: Secondary | ICD-10-CM | POA: Diagnosis not present

## 2021-04-13 DIAGNOSIS — I951 Orthostatic hypotension: Secondary | ICD-10-CM | POA: Diagnosis not present

## 2021-04-13 DIAGNOSIS — F0390 Unspecified dementia without behavioral disturbance: Secondary | ICD-10-CM | POA: Diagnosis not present

## 2021-04-13 DIAGNOSIS — E559 Vitamin D deficiency, unspecified: Secondary | ICD-10-CM | POA: Diagnosis not present

## 2021-04-13 DIAGNOSIS — E876 Hypokalemia: Secondary | ICD-10-CM | POA: Diagnosis not present

## 2021-04-13 DIAGNOSIS — N183 Chronic kidney disease, stage 3 unspecified: Secondary | ICD-10-CM | POA: Diagnosis not present

## 2021-04-13 DIAGNOSIS — E785 Hyperlipidemia, unspecified: Secondary | ICD-10-CM | POA: Diagnosis not present

## 2021-04-13 DIAGNOSIS — I69354 Hemiplegia and hemiparesis following cerebral infarction affecting left non-dominant side: Secondary | ICD-10-CM | POA: Diagnosis not present

## 2021-04-13 DIAGNOSIS — E1122 Type 2 diabetes mellitus with diabetic chronic kidney disease: Secondary | ICD-10-CM | POA: Diagnosis not present

## 2021-04-16 DIAGNOSIS — E876 Hypokalemia: Secondary | ICD-10-CM | POA: Diagnosis not present

## 2021-04-16 DIAGNOSIS — I129 Hypertensive chronic kidney disease with stage 1 through stage 4 chronic kidney disease, or unspecified chronic kidney disease: Secondary | ICD-10-CM | POA: Diagnosis not present

## 2021-04-16 DIAGNOSIS — E1122 Type 2 diabetes mellitus with diabetic chronic kidney disease: Secondary | ICD-10-CM | POA: Diagnosis not present

## 2021-04-16 DIAGNOSIS — I951 Orthostatic hypotension: Secondary | ICD-10-CM | POA: Diagnosis not present

## 2021-04-16 DIAGNOSIS — F0390 Unspecified dementia without behavioral disturbance: Secondary | ICD-10-CM | POA: Diagnosis not present

## 2021-04-16 DIAGNOSIS — N183 Chronic kidney disease, stage 3 unspecified: Secondary | ICD-10-CM | POA: Diagnosis not present

## 2021-04-16 DIAGNOSIS — I69354 Hemiplegia and hemiparesis following cerebral infarction affecting left non-dominant side: Secondary | ICD-10-CM | POA: Diagnosis not present

## 2021-04-16 DIAGNOSIS — E559 Vitamin D deficiency, unspecified: Secondary | ICD-10-CM | POA: Diagnosis not present

## 2021-04-16 DIAGNOSIS — E785 Hyperlipidemia, unspecified: Secondary | ICD-10-CM | POA: Diagnosis not present

## 2021-04-17 DIAGNOSIS — I951 Orthostatic hypotension: Secondary | ICD-10-CM | POA: Diagnosis not present

## 2021-04-17 DIAGNOSIS — E1122 Type 2 diabetes mellitus with diabetic chronic kidney disease: Secondary | ICD-10-CM | POA: Diagnosis not present

## 2021-04-17 DIAGNOSIS — F0390 Unspecified dementia without behavioral disturbance: Secondary | ICD-10-CM | POA: Diagnosis not present

## 2021-04-17 DIAGNOSIS — E559 Vitamin D deficiency, unspecified: Secondary | ICD-10-CM | POA: Diagnosis not present

## 2021-04-17 DIAGNOSIS — E785 Hyperlipidemia, unspecified: Secondary | ICD-10-CM | POA: Diagnosis not present

## 2021-04-17 DIAGNOSIS — I129 Hypertensive chronic kidney disease with stage 1 through stage 4 chronic kidney disease, or unspecified chronic kidney disease: Secondary | ICD-10-CM | POA: Diagnosis not present

## 2021-04-17 DIAGNOSIS — N183 Chronic kidney disease, stage 3 unspecified: Secondary | ICD-10-CM | POA: Diagnosis not present

## 2021-04-17 DIAGNOSIS — I69354 Hemiplegia and hemiparesis following cerebral infarction affecting left non-dominant side: Secondary | ICD-10-CM | POA: Diagnosis not present

## 2021-04-17 DIAGNOSIS — E876 Hypokalemia: Secondary | ICD-10-CM | POA: Diagnosis not present

## 2021-04-21 ENCOUNTER — Other Ambulatory Visit: Payer: Self-pay | Admitting: Internal Medicine

## 2021-04-21 DIAGNOSIS — N3281 Overactive bladder: Secondary | ICD-10-CM

## 2021-04-21 MED ORDER — TOLTERODINE TARTRATE ER 4 MG PO CP24: 4.0000 mg | ORAL_CAPSULE | Freq: Every day | ORAL | 2 refills | Status: DC

## 2021-04-21 MED ORDER — OXYBUTYNIN CHLORIDE ER 10 MG PO TB24: ORAL_TABLET | ORAL | 1 refills | Status: DC

## 2021-04-21 NOTE — Progress Notes (Signed)
Virtual Visit via Video Note The purpose of this virtual visit is to provide medical care while limiting exposure to the novel coronavirus.    Consent was obtained for video visit:  Yes.   Answered questions that patient had about telehealth interaction:  Yes.   I discussed the limitations, risks, security and privacy concerns of performing an evaluation and management service by telemedicine. I also discussed with the patient that there may be a patient responsible charge related to this service. The patient expressed understanding and agreed to proceed.  Pt location: Home Physician Location: office Name of referring provider:  Unk Pinto, MD I connected with Mathew Miller. at patients initiation/request on 04/22/2021 at 10:10 AM EDT by video enabled telemedicine application and verified that I am speaking with the correct person using two identifiers. Pt MRN:  [id number] Pt DOB:  [birth year] Video Participants:  Mathew Miller.; his wife  Assessment and Plan:   Multiple System Atrophy Dementia Autonomic dysfunction   Donepezil 10mg  QHS Belsomra 15mg  QHS to help with sleep Autonomic/blood pressure management as per cardiology Follow up 6 months or sooner if needed.  History of Present Illness:  Mathew Miller is a 75 year old male with hypertension, hyperlipidemia, prediabetes and sensorineural hearing loss who follows up for orthostatic hypotension and neurocognitive disorder.  UPDATE: Current medications:  donepezil 10mg  QHS, Belsomra 15mg  QHS, Mestinon, oxybutynin,   Less syncopal spells but blood pressure and has continued to be labile. Mostly running 329J systolic but has fluctuated from 90s to 190s.  Belsomra has helped with rest.  He has been sleeping better.  He ambulates with a walker only in the house.  Mostly sedentary.  [employer] now only approving home care one day a week.     HISTORY: He began experiencing dizziness in 2018.  Around that time,  enalapril was switched to losartan due to cough.  He describes the dizziness as a sensation that he is going to pass out but has not lost consciousness.  It occurs when he is up and walking and resolves when he stops to rest.  There is no associated spinning sensation or unilateral numbness and weakness.  Blood pressure medication was discontinued and he did feel better.  However, he subsequently developed significant orthostatic hypotension (BP drops from 140s/80s to 60s/40s) with multiple syncopal episodes difficult to control due to co-existing hypertension.  He has been on midodrine and Florinef which had to be discontinued.  Echocardiogram in September 2021 was normal.  EKG showed prolonged QT thought to be secondary to Seroquel, which was discontinued.  He was started on Mestinon.   Also in 2018, he began experiencing short-term memory problems.  It is primarily word-finding difficulty.  He hasn't really exhibited repeating questions.  His wife does most of the driving.  He does not get disoriented on familiar routes.  His wife pays the bills.  He uses a pillbox.  He is able to bathe, dress and use the toilet independently.  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  He has no family history of dementia.  TSH from 11/29/17 was 4.83.  Currently being monitored.  B12 on 01/10/18 was 235.  He was advised to start OTC B12 1030mcg daily.  MRI of brain on 01/19/2018 showed cerebral atrophy and chronic small vessel ischemic changes in the cerebral white matter and pons.  MRI of cervical spine showed spondylosis with mild left foraminal stenosis  at C5-6 and moderate left foraminal stenosis at C6-7.  He had neuropsychological evaluation in October 2019 which demonstrated cognitive decline in multiple areas including processing speed, Control and instrumentation engineer, language abilities, executive functioning and delayed recall of information presented once.  Performance consistent  with dementia but as patient and wife endorsed independence, he was diagnosed with subcortical and cortical multidomain mild cognitive impairment.  Over the next couple of years, he has had a cognitive decline.  He started exhibiting visual hallucinations such as children or cats in the house.  When riding in the car, he sometimes believes somebody else is sitting in the backseat.  Starting in the late afternoon, he may start acting busy, pretending that he is working in the shop.  Now that Seroquel was discontinued, this behavioral and hallucinations are worse, which may interrupt sleep.  However, he is not frightened or agitated by these hallucinations or behavior.  He denies tremor.  He denies difficulty using utensils or holding a cup.  He denies difficulty with swallowing.  He denies REM sleep behavior disorder.  He denies GI symptoms such as abdominal discomfort, constipation or diarrhea.  He denies anhidrosis.   He was hospitalized in September 2021 for fall and left sided weakness.  MRI of brain personally reviewed showed 2 acute infarcts in the right frontal white matter,embolic of unknown source, in addition to moderate chronic small vessel ischemic changes with multiple remote lacunar infarcts progressed since 2019.  No ventirculomegaly.  MRA of head showed mild to moderate right P2 stenosis but no major intracranial arterial occlusion or stenosis.  Carotid doppler without hemodynamically significant stenosis.  2D echocardiogram with EF 60-65% and no cardiac source of emboli.  LDL 69 and Hgb A1c 4.9.  He was also noted to have hyperreflexia, so MRI cervical spine was performed which showed severe left C5-6 and C6-7 neural foraminal stenosis but no significant spinal canal stenosis causing cord compression.  He was started on ASA 81mg  daily.     Symptoms, including confusion, unsteady gait and orthostatic hypotension, have been worse since the stroke.  He has a walker but mostly is  nonambulatory.  Past Medical History: Past Medical History:  Diagnosis Date   Constipation    Hyperlipidemia    Hypertension    Prediabetes    no meds    Vitamin D deficiency     Medications: Outpatient Encounter Medications as of 04/22/2021  Medication Sig   aspirin EC 81 MG tablet Take 81 mg by mouth daily. Swallow whole.   cholecalciferol (VITAMIN D3) 25 MCG (1000 UNIT) tablet Take 5,000 Units by mouth daily.   donepezil (ARICEPT) 10 MG tablet Take 1 tablet (10 mg total) by mouth at bedtime. (Patient taking differently: Take 5 mg by mouth at bedtime.)   loratadine (ALLERGY) 10 MG tablet Take 10 mg by mouth daily.   Magnesium 250 MG TABS Take 250 mg by mouth daily.   mirabegron ER (MYRBETRIQ) 50 MG TB24 tablet Take  1 tablet  Daily  for Bladder   polyethylene glycol (MIRALAX / GLYCOLAX) 17 g packet Take 17 g by mouth daily.   potassium chloride SA (KLOR-CON) 20 MEQ tablet TAKE 1 TABLET TWICE DAILY  (FOR POTASSIUM REPLACEMENT)   pyridostigmine (MESTINON) 60 MG tablet Take 1 tablet (60 mg total) by mouth in the morning and at bedtime.   rosuvastatin (CRESTOR) 5 MG tablet TAKE 1 TABLET EVERY DAY FOR CHOLESTEROL   senna-docusate (GNP STOOL SOFTENER/LAXATIVE) 8.6-50 MG tablet Take 1 tablet Daily for Constipation  Suvorexant (BELSOMRA) 15 MG TABS Take 15 mg by mouth at bedtime.   No facility-administered encounter medications on file as of 04/22/2021.    Allergies: Allergies  Allergen Reactions   Hydrocodone-Acetaminophen Nausea Only   Sudafed [Pseudoephedrine Hcl]     Urinary retention   Tetanus Toxoids     Itching, burning feeling, ran a fever    Family History: Family History  Problem Relation Age of Onset   Hyperlipidemia Mother    Glaucoma Mother    Heart disease Father    Hypertension Father    Colon cancer Neg Hx    Esophageal cancer Neg Hx    Pancreatic cancer Neg Hx    Prostate cancer Neg Hx    Rectal cancer Neg Hx    Stomach cancer Neg Hx      Observations/Objective:   Height 6' (1.829 m), weight 155 lb (70.3 kg).  Follow Up Instructions:    -I discussed the assessment and treatment plan with the patient. The patient was provided an opportunity to ask questions and all were answered. The patient agreed with the plan and demonstrated an understanding of the instructions.   The patient was advised to call back or seek an in-person evaluation if the symptoms worsen or if the condition fails to improve as anticipated.  Dudley Major, DO

## 2021-04-22 ENCOUNTER — Encounter: Payer: Self-pay | Admitting: Neurology

## 2021-04-22 ENCOUNTER — Other Ambulatory Visit: Payer: Self-pay

## 2021-04-22 ENCOUNTER — Telehealth (INDEPENDENT_AMBULATORY_CARE_PROVIDER_SITE_OTHER): Payer: Medicare HMO | Admitting: Neurology

## 2021-04-22 VITALS — Ht 72.0 in | Wt 155.0 lb

## 2021-04-22 DIAGNOSIS — I129 Hypertensive chronic kidney disease with stage 1 through stage 4 chronic kidney disease, or unspecified chronic kidney disease: Secondary | ICD-10-CM | POA: Diagnosis not present

## 2021-04-22 DIAGNOSIS — E785 Hyperlipidemia, unspecified: Secondary | ICD-10-CM | POA: Diagnosis not present

## 2021-04-22 DIAGNOSIS — G909 Disorder of the autonomic nervous system, unspecified: Secondary | ICD-10-CM

## 2021-04-22 DIAGNOSIS — G903 Multi-system degeneration of the autonomic nervous system: Secondary | ICD-10-CM

## 2021-04-22 DIAGNOSIS — E559 Vitamin D deficiency, unspecified: Secondary | ICD-10-CM | POA: Diagnosis not present

## 2021-04-22 DIAGNOSIS — E876 Hypokalemia: Secondary | ICD-10-CM | POA: Diagnosis not present

## 2021-04-22 DIAGNOSIS — F0280 Dementia in other diseases classified elsewhere without behavioral disturbance: Secondary | ICD-10-CM

## 2021-04-22 DIAGNOSIS — N183 Chronic kidney disease, stage 3 unspecified: Secondary | ICD-10-CM | POA: Diagnosis not present

## 2021-04-22 DIAGNOSIS — I951 Orthostatic hypotension: Secondary | ICD-10-CM | POA: Diagnosis not present

## 2021-04-22 DIAGNOSIS — I69354 Hemiplegia and hemiparesis following cerebral infarction affecting left non-dominant side: Secondary | ICD-10-CM | POA: Diagnosis not present

## 2021-04-22 DIAGNOSIS — F0390 Unspecified dementia without behavioral disturbance: Secondary | ICD-10-CM | POA: Diagnosis not present

## 2021-04-22 DIAGNOSIS — E1122 Type 2 diabetes mellitus with diabetic chronic kidney disease: Secondary | ICD-10-CM | POA: Diagnosis not present

## 2021-04-23 DIAGNOSIS — I69354 Hemiplegia and hemiparesis following cerebral infarction affecting left non-dominant side: Secondary | ICD-10-CM | POA: Diagnosis not present

## 2021-04-23 DIAGNOSIS — E876 Hypokalemia: Secondary | ICD-10-CM | POA: Diagnosis not present

## 2021-04-23 DIAGNOSIS — N183 Chronic kidney disease, stage 3 unspecified: Secondary | ICD-10-CM | POA: Diagnosis not present

## 2021-04-23 DIAGNOSIS — I951 Orthostatic hypotension: Secondary | ICD-10-CM | POA: Diagnosis not present

## 2021-04-23 DIAGNOSIS — I129 Hypertensive chronic kidney disease with stage 1 through stage 4 chronic kidney disease, or unspecified chronic kidney disease: Secondary | ICD-10-CM | POA: Diagnosis not present

## 2021-04-23 DIAGNOSIS — E1122 Type 2 diabetes mellitus with diabetic chronic kidney disease: Secondary | ICD-10-CM | POA: Diagnosis not present

## 2021-04-23 DIAGNOSIS — E559 Vitamin D deficiency, unspecified: Secondary | ICD-10-CM | POA: Diagnosis not present

## 2021-04-23 DIAGNOSIS — E785 Hyperlipidemia, unspecified: Secondary | ICD-10-CM | POA: Diagnosis not present

## 2021-04-23 DIAGNOSIS — F0390 Unspecified dementia without behavioral disturbance: Secondary | ICD-10-CM | POA: Diagnosis not present

## 2021-04-27 DIAGNOSIS — I129 Hypertensive chronic kidney disease with stage 1 through stage 4 chronic kidney disease, or unspecified chronic kidney disease: Secondary | ICD-10-CM | POA: Diagnosis not present

## 2021-04-27 DIAGNOSIS — I69354 Hemiplegia and hemiparesis following cerebral infarction affecting left non-dominant side: Secondary | ICD-10-CM | POA: Diagnosis not present

## 2021-04-27 DIAGNOSIS — E785 Hyperlipidemia, unspecified: Secondary | ICD-10-CM | POA: Diagnosis not present

## 2021-04-27 DIAGNOSIS — E1122 Type 2 diabetes mellitus with diabetic chronic kidney disease: Secondary | ICD-10-CM | POA: Diagnosis not present

## 2021-04-27 DIAGNOSIS — F0390 Unspecified dementia without behavioral disturbance: Secondary | ICD-10-CM | POA: Diagnosis not present

## 2021-04-27 DIAGNOSIS — I951 Orthostatic hypotension: Secondary | ICD-10-CM | POA: Diagnosis not present

## 2021-04-27 DIAGNOSIS — E559 Vitamin D deficiency, unspecified: Secondary | ICD-10-CM | POA: Diagnosis not present

## 2021-04-27 DIAGNOSIS — N183 Chronic kidney disease, stage 3 unspecified: Secondary | ICD-10-CM | POA: Diagnosis not present

## 2021-04-27 DIAGNOSIS — E876 Hypokalemia: Secondary | ICD-10-CM | POA: Diagnosis not present

## 2021-04-30 DIAGNOSIS — N183 Chronic kidney disease, stage 3 unspecified: Secondary | ICD-10-CM | POA: Diagnosis not present

## 2021-04-30 DIAGNOSIS — I951 Orthostatic hypotension: Secondary | ICD-10-CM | POA: Diagnosis not present

## 2021-04-30 DIAGNOSIS — I129 Hypertensive chronic kidney disease with stage 1 through stage 4 chronic kidney disease, or unspecified chronic kidney disease: Secondary | ICD-10-CM | POA: Diagnosis not present

## 2021-04-30 DIAGNOSIS — E785 Hyperlipidemia, unspecified: Secondary | ICD-10-CM | POA: Diagnosis not present

## 2021-04-30 DIAGNOSIS — E876 Hypokalemia: Secondary | ICD-10-CM | POA: Diagnosis not present

## 2021-04-30 DIAGNOSIS — F0390 Unspecified dementia without behavioral disturbance: Secondary | ICD-10-CM | POA: Diagnosis not present

## 2021-04-30 DIAGNOSIS — E1122 Type 2 diabetes mellitus with diabetic chronic kidney disease: Secondary | ICD-10-CM | POA: Diagnosis not present

## 2021-04-30 DIAGNOSIS — I69354 Hemiplegia and hemiparesis following cerebral infarction affecting left non-dominant side: Secondary | ICD-10-CM | POA: Diagnosis not present

## 2021-04-30 DIAGNOSIS — E559 Vitamin D deficiency, unspecified: Secondary | ICD-10-CM | POA: Diagnosis not present

## 2021-05-05 DIAGNOSIS — F0390 Unspecified dementia without behavioral disturbance: Secondary | ICD-10-CM | POA: Diagnosis not present

## 2021-05-05 DIAGNOSIS — E1122 Type 2 diabetes mellitus with diabetic chronic kidney disease: Secondary | ICD-10-CM | POA: Diagnosis not present

## 2021-05-05 DIAGNOSIS — I69354 Hemiplegia and hemiparesis following cerebral infarction affecting left non-dominant side: Secondary | ICD-10-CM | POA: Diagnosis not present

## 2021-05-05 DIAGNOSIS — E876 Hypokalemia: Secondary | ICD-10-CM | POA: Diagnosis not present

## 2021-05-05 DIAGNOSIS — I129 Hypertensive chronic kidney disease with stage 1 through stage 4 chronic kidney disease, or unspecified chronic kidney disease: Secondary | ICD-10-CM | POA: Diagnosis not present

## 2021-05-05 DIAGNOSIS — E785 Hyperlipidemia, unspecified: Secondary | ICD-10-CM | POA: Diagnosis not present

## 2021-05-05 DIAGNOSIS — E559 Vitamin D deficiency, unspecified: Secondary | ICD-10-CM | POA: Diagnosis not present

## 2021-05-05 DIAGNOSIS — I951 Orthostatic hypotension: Secondary | ICD-10-CM | POA: Diagnosis not present

## 2021-05-05 DIAGNOSIS — N183 Chronic kidney disease, stage 3 unspecified: Secondary | ICD-10-CM | POA: Diagnosis not present

## 2021-05-07 DIAGNOSIS — I129 Hypertensive chronic kidney disease with stage 1 through stage 4 chronic kidney disease, or unspecified chronic kidney disease: Secondary | ICD-10-CM | POA: Diagnosis not present

## 2021-05-07 DIAGNOSIS — E1122 Type 2 diabetes mellitus with diabetic chronic kidney disease: Secondary | ICD-10-CM | POA: Diagnosis not present

## 2021-05-07 DIAGNOSIS — E876 Hypokalemia: Secondary | ICD-10-CM | POA: Diagnosis not present

## 2021-05-07 DIAGNOSIS — I951 Orthostatic hypotension: Secondary | ICD-10-CM | POA: Diagnosis not present

## 2021-05-07 DIAGNOSIS — I69354 Hemiplegia and hemiparesis following cerebral infarction affecting left non-dominant side: Secondary | ICD-10-CM | POA: Diagnosis not present

## 2021-05-07 DIAGNOSIS — F0390 Unspecified dementia without behavioral disturbance: Secondary | ICD-10-CM | POA: Diagnosis not present

## 2021-05-07 DIAGNOSIS — E785 Hyperlipidemia, unspecified: Secondary | ICD-10-CM | POA: Diagnosis not present

## 2021-05-07 DIAGNOSIS — E559 Vitamin D deficiency, unspecified: Secondary | ICD-10-CM | POA: Diagnosis not present

## 2021-05-07 DIAGNOSIS — N183 Chronic kidney disease, stage 3 unspecified: Secondary | ICD-10-CM | POA: Diagnosis not present

## 2021-05-11 DIAGNOSIS — I129 Hypertensive chronic kidney disease with stage 1 through stage 4 chronic kidney disease, or unspecified chronic kidney disease: Secondary | ICD-10-CM | POA: Diagnosis not present

## 2021-05-11 DIAGNOSIS — E785 Hyperlipidemia, unspecified: Secondary | ICD-10-CM | POA: Diagnosis not present

## 2021-05-11 DIAGNOSIS — I69354 Hemiplegia and hemiparesis following cerebral infarction affecting left non-dominant side: Secondary | ICD-10-CM | POA: Diagnosis not present

## 2021-05-11 DIAGNOSIS — E1122 Type 2 diabetes mellitus with diabetic chronic kidney disease: Secondary | ICD-10-CM | POA: Diagnosis not present

## 2021-05-11 DIAGNOSIS — I951 Orthostatic hypotension: Secondary | ICD-10-CM | POA: Diagnosis not present

## 2021-05-11 DIAGNOSIS — F0390 Unspecified dementia without behavioral disturbance: Secondary | ICD-10-CM | POA: Diagnosis not present

## 2021-05-11 DIAGNOSIS — E559 Vitamin D deficiency, unspecified: Secondary | ICD-10-CM | POA: Diagnosis not present

## 2021-05-11 DIAGNOSIS — N183 Chronic kidney disease, stage 3 unspecified: Secondary | ICD-10-CM | POA: Diagnosis not present

## 2021-05-11 DIAGNOSIS — E876 Hypokalemia: Secondary | ICD-10-CM | POA: Diagnosis not present

## 2021-05-14 DIAGNOSIS — F0390 Unspecified dementia without behavioral disturbance: Secondary | ICD-10-CM | POA: Diagnosis not present

## 2021-05-14 DIAGNOSIS — I129 Hypertensive chronic kidney disease with stage 1 through stage 4 chronic kidney disease, or unspecified chronic kidney disease: Secondary | ICD-10-CM | POA: Diagnosis not present

## 2021-05-14 DIAGNOSIS — I69354 Hemiplegia and hemiparesis following cerebral infarction affecting left non-dominant side: Secondary | ICD-10-CM | POA: Diagnosis not present

## 2021-05-14 DIAGNOSIS — E785 Hyperlipidemia, unspecified: Secondary | ICD-10-CM | POA: Diagnosis not present

## 2021-05-14 DIAGNOSIS — I951 Orthostatic hypotension: Secondary | ICD-10-CM | POA: Diagnosis not present

## 2021-05-14 DIAGNOSIS — N183 Chronic kidney disease, stage 3 unspecified: Secondary | ICD-10-CM | POA: Diagnosis not present

## 2021-05-14 DIAGNOSIS — E559 Vitamin D deficiency, unspecified: Secondary | ICD-10-CM | POA: Diagnosis not present

## 2021-05-14 DIAGNOSIS — E1122 Type 2 diabetes mellitus with diabetic chronic kidney disease: Secondary | ICD-10-CM | POA: Diagnosis not present

## 2021-05-14 DIAGNOSIS — E876 Hypokalemia: Secondary | ICD-10-CM | POA: Diagnosis not present

## 2021-05-17 DIAGNOSIS — E1122 Type 2 diabetes mellitus with diabetic chronic kidney disease: Secondary | ICD-10-CM | POA: Diagnosis not present

## 2021-05-17 DIAGNOSIS — I951 Orthostatic hypotension: Secondary | ICD-10-CM | POA: Diagnosis not present

## 2021-05-17 DIAGNOSIS — I129 Hypertensive chronic kidney disease with stage 1 through stage 4 chronic kidney disease, or unspecified chronic kidney disease: Secondary | ICD-10-CM | POA: Diagnosis not present

## 2021-05-17 DIAGNOSIS — E785 Hyperlipidemia, unspecified: Secondary | ICD-10-CM | POA: Diagnosis not present

## 2021-05-17 DIAGNOSIS — N183 Chronic kidney disease, stage 3 unspecified: Secondary | ICD-10-CM | POA: Diagnosis not present

## 2021-05-17 DIAGNOSIS — E559 Vitamin D deficiency, unspecified: Secondary | ICD-10-CM | POA: Diagnosis not present

## 2021-05-17 DIAGNOSIS — I69354 Hemiplegia and hemiparesis following cerebral infarction affecting left non-dominant side: Secondary | ICD-10-CM | POA: Diagnosis not present

## 2021-05-17 DIAGNOSIS — F0390 Unspecified dementia without behavioral disturbance: Secondary | ICD-10-CM | POA: Diagnosis not present

## 2021-05-17 DIAGNOSIS — E876 Hypokalemia: Secondary | ICD-10-CM | POA: Diagnosis not present

## 2021-05-18 DIAGNOSIS — I951 Orthostatic hypotension: Secondary | ICD-10-CM | POA: Diagnosis not present

## 2021-05-18 DIAGNOSIS — I69354 Hemiplegia and hemiparesis following cerebral infarction affecting left non-dominant side: Secondary | ICD-10-CM | POA: Diagnosis not present

## 2021-05-18 DIAGNOSIS — I129 Hypertensive chronic kidney disease with stage 1 through stage 4 chronic kidney disease, or unspecified chronic kidney disease: Secondary | ICD-10-CM | POA: Diagnosis not present

## 2021-05-18 DIAGNOSIS — F0390 Unspecified dementia without behavioral disturbance: Secondary | ICD-10-CM | POA: Diagnosis not present

## 2021-05-18 DIAGNOSIS — E876 Hypokalemia: Secondary | ICD-10-CM | POA: Diagnosis not present

## 2021-05-18 DIAGNOSIS — E785 Hyperlipidemia, unspecified: Secondary | ICD-10-CM | POA: Diagnosis not present

## 2021-05-18 DIAGNOSIS — E559 Vitamin D deficiency, unspecified: Secondary | ICD-10-CM | POA: Diagnosis not present

## 2021-05-18 DIAGNOSIS — N183 Chronic kidney disease, stage 3 unspecified: Secondary | ICD-10-CM | POA: Diagnosis not present

## 2021-05-18 DIAGNOSIS — E1122 Type 2 diabetes mellitus with diabetic chronic kidney disease: Secondary | ICD-10-CM | POA: Diagnosis not present

## 2021-05-21 DIAGNOSIS — E1122 Type 2 diabetes mellitus with diabetic chronic kidney disease: Secondary | ICD-10-CM | POA: Diagnosis not present

## 2021-05-21 DIAGNOSIS — E876 Hypokalemia: Secondary | ICD-10-CM | POA: Diagnosis not present

## 2021-05-21 DIAGNOSIS — I69354 Hemiplegia and hemiparesis following cerebral infarction affecting left non-dominant side: Secondary | ICD-10-CM | POA: Diagnosis not present

## 2021-05-21 DIAGNOSIS — E785 Hyperlipidemia, unspecified: Secondary | ICD-10-CM | POA: Diagnosis not present

## 2021-05-21 DIAGNOSIS — N183 Chronic kidney disease, stage 3 unspecified: Secondary | ICD-10-CM | POA: Diagnosis not present

## 2021-05-21 DIAGNOSIS — F0390 Unspecified dementia without behavioral disturbance: Secondary | ICD-10-CM | POA: Diagnosis not present

## 2021-05-21 DIAGNOSIS — I951 Orthostatic hypotension: Secondary | ICD-10-CM | POA: Diagnosis not present

## 2021-05-21 DIAGNOSIS — E559 Vitamin D deficiency, unspecified: Secondary | ICD-10-CM | POA: Diagnosis not present

## 2021-05-21 DIAGNOSIS — I129 Hypertensive chronic kidney disease with stage 1 through stage 4 chronic kidney disease, or unspecified chronic kidney disease: Secondary | ICD-10-CM | POA: Diagnosis not present

## 2021-05-25 DIAGNOSIS — E559 Vitamin D deficiency, unspecified: Secondary | ICD-10-CM | POA: Diagnosis not present

## 2021-05-25 DIAGNOSIS — E785 Hyperlipidemia, unspecified: Secondary | ICD-10-CM | POA: Diagnosis not present

## 2021-05-25 DIAGNOSIS — N183 Chronic kidney disease, stage 3 unspecified: Secondary | ICD-10-CM | POA: Diagnosis not present

## 2021-05-25 DIAGNOSIS — I951 Orthostatic hypotension: Secondary | ICD-10-CM | POA: Diagnosis not present

## 2021-05-25 DIAGNOSIS — I69354 Hemiplegia and hemiparesis following cerebral infarction affecting left non-dominant side: Secondary | ICD-10-CM | POA: Diagnosis not present

## 2021-05-25 DIAGNOSIS — E876 Hypokalemia: Secondary | ICD-10-CM | POA: Diagnosis not present

## 2021-05-25 DIAGNOSIS — E1122 Type 2 diabetes mellitus with diabetic chronic kidney disease: Secondary | ICD-10-CM | POA: Diagnosis not present

## 2021-05-25 DIAGNOSIS — F0390 Unspecified dementia without behavioral disturbance: Secondary | ICD-10-CM | POA: Diagnosis not present

## 2021-05-25 DIAGNOSIS — I129 Hypertensive chronic kidney disease with stage 1 through stage 4 chronic kidney disease, or unspecified chronic kidney disease: Secondary | ICD-10-CM | POA: Diagnosis not present

## 2021-05-28 ENCOUNTER — Ambulatory Visit: Payer: Medicare HMO | Admitting: Neurology

## 2021-05-28 DIAGNOSIS — E559 Vitamin D deficiency, unspecified: Secondary | ICD-10-CM | POA: Diagnosis not present

## 2021-05-28 DIAGNOSIS — E876 Hypokalemia: Secondary | ICD-10-CM | POA: Diagnosis not present

## 2021-05-28 DIAGNOSIS — N183 Chronic kidney disease, stage 3 unspecified: Secondary | ICD-10-CM | POA: Diagnosis not present

## 2021-05-28 DIAGNOSIS — I951 Orthostatic hypotension: Secondary | ICD-10-CM | POA: Diagnosis not present

## 2021-05-28 DIAGNOSIS — E785 Hyperlipidemia, unspecified: Secondary | ICD-10-CM | POA: Diagnosis not present

## 2021-05-28 DIAGNOSIS — F0390 Unspecified dementia without behavioral disturbance: Secondary | ICD-10-CM | POA: Diagnosis not present

## 2021-05-28 DIAGNOSIS — E1122 Type 2 diabetes mellitus with diabetic chronic kidney disease: Secondary | ICD-10-CM | POA: Diagnosis not present

## 2021-05-28 DIAGNOSIS — I129 Hypertensive chronic kidney disease with stage 1 through stage 4 chronic kidney disease, or unspecified chronic kidney disease: Secondary | ICD-10-CM | POA: Diagnosis not present

## 2021-05-28 DIAGNOSIS — I69354 Hemiplegia and hemiparesis following cerebral infarction affecting left non-dominant side: Secondary | ICD-10-CM | POA: Diagnosis not present

## 2021-05-31 DIAGNOSIS — I69354 Hemiplegia and hemiparesis following cerebral infarction affecting left non-dominant side: Secondary | ICD-10-CM | POA: Diagnosis not present

## 2021-06-01 DIAGNOSIS — I69354 Hemiplegia and hemiparesis following cerebral infarction affecting left non-dominant side: Secondary | ICD-10-CM | POA: Diagnosis not present

## 2021-06-01 DIAGNOSIS — I951 Orthostatic hypotension: Secondary | ICD-10-CM | POA: Diagnosis not present

## 2021-06-01 DIAGNOSIS — I129 Hypertensive chronic kidney disease with stage 1 through stage 4 chronic kidney disease, or unspecified chronic kidney disease: Secondary | ICD-10-CM | POA: Diagnosis not present

## 2021-06-01 DIAGNOSIS — E559 Vitamin D deficiency, unspecified: Secondary | ICD-10-CM | POA: Diagnosis not present

## 2021-06-01 DIAGNOSIS — E876 Hypokalemia: Secondary | ICD-10-CM | POA: Diagnosis not present

## 2021-06-01 DIAGNOSIS — F0390 Unspecified dementia without behavioral disturbance: Secondary | ICD-10-CM | POA: Diagnosis not present

## 2021-06-01 DIAGNOSIS — E1122 Type 2 diabetes mellitus with diabetic chronic kidney disease: Secondary | ICD-10-CM | POA: Diagnosis not present

## 2021-06-01 DIAGNOSIS — N183 Chronic kidney disease, stage 3 unspecified: Secondary | ICD-10-CM | POA: Diagnosis not present

## 2021-06-01 DIAGNOSIS — E785 Hyperlipidemia, unspecified: Secondary | ICD-10-CM | POA: Diagnosis not present

## 2021-06-02 ENCOUNTER — Ambulatory Visit: Payer: Medicare HMO | Admitting: Neurology

## 2021-06-04 DIAGNOSIS — E1122 Type 2 diabetes mellitus with diabetic chronic kidney disease: Secondary | ICD-10-CM | POA: Diagnosis not present

## 2021-06-04 DIAGNOSIS — I129 Hypertensive chronic kidney disease with stage 1 through stage 4 chronic kidney disease, or unspecified chronic kidney disease: Secondary | ICD-10-CM | POA: Diagnosis not present

## 2021-06-04 DIAGNOSIS — F0390 Unspecified dementia without behavioral disturbance: Secondary | ICD-10-CM | POA: Diagnosis not present

## 2021-06-04 DIAGNOSIS — N183 Chronic kidney disease, stage 3 unspecified: Secondary | ICD-10-CM | POA: Diagnosis not present

## 2021-06-04 DIAGNOSIS — I951 Orthostatic hypotension: Secondary | ICD-10-CM | POA: Diagnosis not present

## 2021-06-04 DIAGNOSIS — E785 Hyperlipidemia, unspecified: Secondary | ICD-10-CM | POA: Diagnosis not present

## 2021-06-04 DIAGNOSIS — E559 Vitamin D deficiency, unspecified: Secondary | ICD-10-CM | POA: Diagnosis not present

## 2021-06-04 DIAGNOSIS — E876 Hypokalemia: Secondary | ICD-10-CM | POA: Diagnosis not present

## 2021-06-04 DIAGNOSIS — I69354 Hemiplegia and hemiparesis following cerebral infarction affecting left non-dominant side: Secondary | ICD-10-CM | POA: Diagnosis not present

## 2021-06-11 DIAGNOSIS — F0390 Unspecified dementia without behavioral disturbance: Secondary | ICD-10-CM | POA: Diagnosis not present

## 2021-06-11 DIAGNOSIS — N183 Chronic kidney disease, stage 3 unspecified: Secondary | ICD-10-CM | POA: Diagnosis not present

## 2021-06-11 DIAGNOSIS — I129 Hypertensive chronic kidney disease with stage 1 through stage 4 chronic kidney disease, or unspecified chronic kidney disease: Secondary | ICD-10-CM | POA: Diagnosis not present

## 2021-06-11 DIAGNOSIS — E876 Hypokalemia: Secondary | ICD-10-CM | POA: Diagnosis not present

## 2021-06-11 DIAGNOSIS — I69354 Hemiplegia and hemiparesis following cerebral infarction affecting left non-dominant side: Secondary | ICD-10-CM | POA: Diagnosis not present

## 2021-06-11 DIAGNOSIS — E785 Hyperlipidemia, unspecified: Secondary | ICD-10-CM | POA: Diagnosis not present

## 2021-06-11 DIAGNOSIS — E1122 Type 2 diabetes mellitus with diabetic chronic kidney disease: Secondary | ICD-10-CM | POA: Diagnosis not present

## 2021-06-11 DIAGNOSIS — I951 Orthostatic hypotension: Secondary | ICD-10-CM | POA: Diagnosis not present

## 2021-06-11 DIAGNOSIS — E559 Vitamin D deficiency, unspecified: Secondary | ICD-10-CM | POA: Diagnosis not present

## 2021-06-16 ENCOUNTER — Telehealth: Payer: Medicare HMO | Admitting: Cardiology

## 2021-06-18 ENCOUNTER — Other Ambulatory Visit: Payer: Self-pay | Admitting: Internal Medicine

## 2021-06-18 MED ORDER — MIRABEGRON ER 50 MG PO TB24: ORAL_TABLET | ORAL | 1 refills | Status: DC

## 2021-06-22 ENCOUNTER — Encounter: Payer: Self-pay | Admitting: Internal Medicine

## 2021-06-22 NOTE — Progress Notes (Addendum)
Annual  Screening/Preventative Visit  & Comprehensive Evaluation & Examination  Future Appointments  Date Time Provider [place]  06/23/2021        - CPE  3:00 PM Unk Pinto, MD [facility] None  08/10/2021  9:00 AM Sueanne Margarita, MD [facility]  10/30/2021 10:30 AM Pieter Partridge, DO [facility] None  06/23/2022        - CPE   3:00 PM Unk Pinto, MD [facility] None            This very nice 75 y.o. [name] presents for a Screening /Preventative Visit & comprehensive evaluation and management of multiple medical co-morbidities.  Mathew Miller has been followed for HTN, HLD, Prediabetes and Vitamin D Deficiency.  Mathew Miller has hx/o progressive Dementia & Dysautonomia with postural Hypotension. Mathew Miller is now followed by Dr Tomi Likens for progressive neuro-cognitive deficit ~ Dementia.  Mathew Miller  is  wheelchair dependent & is unable to ambulate independently due to his profound hypotension with  standing.  Mathew Miller  is non-ambulatory requiring a wheelchair for transport.          Presenting to the office today,  Mathew Miller attempted to use the restroom facilities in the outer office hallway corridor and slid off of the toilet trapping himself under his wheel chair & called for help until several male bystanders from an adjacent office who heard his cries for help & came and it took 3 persons to assist the Mathew Miller back into his wheel chair. Fortunately Mathew Miller denied any painful injuries as a result of the fall, but his wife relates that since his home physical therapy sessions ended that he has quickly lost balance & muscle tone having more frequent similar episodes of falling.              Mathew Miller has deteriorated to the point of being essentially bed bound due to  his worsening Dementia. He is unable to  independently position himself in a regular bed due to physical deconditioning & does now have a hospital bed. Mathew Miller requires re-positioning of his body in ways that cannot be  achieved with an ordinary bed or wedge pillow to prevent and to eliminate pain, reduce pressure and prevent skin breakdown and development of pressure sores. Mathew Miller needs assistance & supervision to change positions in bed due to his weakness & Dementia. He also requires the ability to elevate the head of his bed to more than 30 degrees for difficulties with swallowing and concern for aspiration due to his deteriorating neurologic status.          HTN predates since  2000. Mathew Miller's BP has been controlled at home.  Today's BP sitting is low at 96/70. Mathew Miller denies any cardiac symptoms as chest pain, palpitations, shortness of breath, dizziness or ankle swelling.       Mathew Miller's hyperlipidemia is controlled with diet and Rosuvastatin. Mathew Miller denies myalgias or other medication SE's. Last lipids were  Lab Results  Component Value Date   CHOL 142 01/20/2021   HDL 60 01/20/2021   LDLCALC 68 01/20/2021   TRIG 68 01/20/2021   CHOLHDL 2.4 01/20/2021         Mathew Miller has hx/o abnormal glucose (A1c 6.1% /2012) & is followed for glucose intolerance.  Mathew Miller has had no reported reactive hypoglycemic symptoms, visual blurring, diabetic polys or paresthesias. Last A1c was normal:   Lab Results  Component Value Date   HGBA1C 5.1 01/20/2021          Finally, Mathew Miller has history  of Vitamin D Deficiency ("33"/2008) and last vitamin D was at goal:   Lab Results  Component Value Date   VD25OH 74 01/20/2021     Current Outpatient Medications on File Prior to Visit  Medication Sig   aspirin EC 81 MG tablet Take daily   VITAMIN D  5,000 Units  Take daily.   donepezil 10 MG tablet Take 1 tablet  at bedtime.   Magnesium 250 MG TABS Take daily.   MYRBETRIQ 50 MG T Take 1 tablet Daily   polyethylene glycol (17 g packet Take  daily.   KLOR-CON 20 MEQ tablet TAKE 1 TABLET TWICE DAILY    pyridostigmine (MESTINON) 60 MG  Take 1 tablet  in the morning and at bedtime.   rosuvastatin 5 MG tablet TAKE 1  TABLET EVERY DAY    senna-docusate  Take 1 tablet Daily for Constipation     Allergies  Allergen Reactions   Hydrocodone-Acetaminophen Nausea Only   Sudafed [Pseudoephedrine Hcl]     Urinary retention   Tetanus Toxoids     Itching, burning feeling, ran a fever     Past Medical History:  Diagnosis Date   Constipation    Hyperlipidemia    Hypertension    Prediabetes    no meds    Vitamin D deficiency      Health Maintenance  Topic Date Due   COVID-19 Vaccine (3 - Pfizer risk series) 12/23/2019   URINE MICROALBUMIN  07/23/2020   INFLUENZA VACCINE  05/18/2021   Hepatitis C Screening  08/31/2023 (Originally 12/07/1963)   TETANUS/TDAP  08/26/2027 (Originally 10/19/1975)   PNA vac Low Risk Adult  Completed   Zoster Vaccines- Shingrix  Completed   HPV VACCINES  Aged Out     Immunization History  Administered Date(s) Administered   Influenza, High Dose 08/11/2016, 07/20/2017, 07/18/2018   Influenza 07/18/2018, 06/24/2019   PFIZER SARS-COV-2 Vacc  11/04/2019, 11/25/2019   Pneumococcal -13 04/23/2016   Pneumococcal -23 10/21/2009   Td 10/18/1965   Zoster Recombinat (Shingrix) 03/17/2018, 06/08/2018   Zoster, Live 10/19/2011    Last Colon - 09/04/2014 - Dr Deatra Ina - recommended 5 year f/u - overdue   Past Surgical History:  Procedure Laterality Date   CATARACT EXTRACTION, BILATERAL  2017   COLONOSCOPY     NO PAST SURGERIES       Family History  Problem Relation Age of Onset   Hyperlipidemia Mother    Glaucoma Mother    Heart disease Father    Hypertension Father    Colon cancer Neg Hx    Esophageal cancer Neg Hx    Pancreatic cancer Neg Hx    Prostate cancer Neg Hx    Rectal cancer Neg Hx    Stomach cancer Neg Hx     Social History   Socioeconomic History   Marital status: Married    Spouse name: Bethena Roys   Number of children: Not on file  Occupational History   Occupation: retired   Tobacco Use   Smoking status: Never   Smokeless tobacco: Never   Vaping Use   Vaping Use: Never used  Substance and Sexual Activity   Alcohol use: Yes    Alcohol/week: 0.0 standard drinks    Comment: very Rare /Beer.wine.   Drug use: No   Sexual activity: Not on file      ROS  Mathew Miller's severe Dementia precludes reliable systems review.  Review by wife is essentially negative.  Constitutional: Denies fever, chills, weight loss/gain, headaches, insomnia,  night sweats or change in appetite. Does c/o fatigue. Eyes: Denies redness, blurred vision, diplopia, discharge, itchy or watery eyes.  ENT: Denies discharge, congestion, post nasal drip, epistaxis, sore throat, earache, hearing loss, dental pain, Tinnitus, Vertigo, Sinus pain or snoring.  Cardio: Denies chest pain, palpitations, irregular heartbeat, syncope, dyspnea, diaphoresis, orthopnea, PND, claudication or edema Respiratory: denies cough, dyspnea, DOE, pleurisy, hoarseness, laryngitis or wheezing.  Gastrointestinal: Denies dysphagia, heartburn, reflux, water brash, pain, cramps, nausea, vomiting, bloating, diarrhea, constipation, hematemesis, melena, hematochezia, jaundice or hemorrhoids Genitourinary: Denies dysuria, frequency, urgency, nocturia, hesitancy, discharge, hematuria or flank pain Musculoskeletal: Denies arthralgia, myalgia, stiffness, Jt. Swelling, pain, limp or strain/sprain. Has had frequent falls w/o major injury.  Skin: Denies puritis, rash, hives, warts, acne, eczema or change in skin lesion Neuro: No weakness, tremor, incoordination, spasms, paresthesia or pain Psychiatric: Denies confusion, memory loss or sensory loss. Denies Depression. Endocrine: Denies change in weight, skin, hair change, nocturia, and paresthesia, diabetic polys, visual blurring or hyper / hypo glycemic episodes.  Heme/Lymph: No excessive bleeding, bruising or enlarged lymph nodes.   Physical Exam  BP 96/70   Pulse 74   Temp (!) 97.3 F (36.3 C)   SpO2 99%   General Appearance: Well nourished  and well groomed and in no apparent distress.  Eyes: PERRLA, EOMs, conjunctiva no swelling or erythema, normal fundi and vessels. Sinuses: No frontal/maxillary tenderness ENT/Mouth: EACs patent / TMs  nl. Nares clear without erythema, swelling, mucoid exudates. Oral hygiene is good. No erythema, swelling, or exudate. Tongue normal, non-obstructing. Tonsils not swollen or erythematous. Hearing normal.  Neck: Supple, thyroid not palpable. No bruits, nodes or JVD. Respiratory: Respiratory effort normal.  BS equal and clear bilateral without rales, rhonci, wheezing or stridor. Cardio: Heart sounds are normal with regular rate and rhythm and no murmurs, rubs or gallops. Peripheral pulses are normal and equal bilaterally without edema. No aortic or femoral bruits. Chest: symmetric with normal excursions and percussion.  Abdomen: Soft, with Nl bowel sounds. Nontender, no guarding, rebound, hernias, masses, or organomegaly.  Lymphatics: Non tender without lymphadenopathy.  Musculoskeletal: Generalized decrease in Muscle power,tone & bulk. Unable to stand independently. Requires 3-4 + assistance for transfer to wheelchair.  Skin: Warm and dry without rashes, lesions, cyanosis, clubbing or  ecchymosis.  Neuro: Cranial nerves intact, reflexes equal bilaterally. Normal muscle tone, no cerebellar symptoms. Sensation intact.  Pysch: Alert and oriented x 2 with flat affect,  poor short term memory recall, insight and judgment.   Assessment and Plan  1. Annual Preventative/Screening Exam    2. Labile hypertension  - EKG 12-Lead - [place], RETROPERITNL ABD,  LTD - Urinalysis, Routine w reflex microscopic - Microalbumin / creatinine urine ratio - CBC with Differential/Platelet - COMPLETE METABOLIC PANEL WITH GFR - Magnesium - TSH  3. Autonomic postural hypotension  - EKG 12-Lead - [place], RETROPERITNL ABD,  LTD  4. Hyperlipidemia, mixed  - EKG 12-Lead - [place], RETROPERITNL ABD,  LTD - Lipid panel -  TSH  5. Vitamin D deficiency  - VITAMIN D 25 Hydroxy   6. Vitamin B12 deficiency  - Vitamin B12  7. Abnormal glucose  - Hemoglobin A1c - Insulin, random  8. Vascular dementia without behavioral disturbance (HCC)  - Lipid panel  9. BPH with obstruction/lower urinary tract symptoms  - PSA  10. OAB (overactive bladder)   11. Unstable gait   12. At high risk for injury related to fall   13. Ischemic stroke of frontal lobe ([place])   14.  Screening for colorectal cancer  - POC Hemoccult Bld/Stl   15. Prostate cancer screening  - PSA  16. Screening for ischemic heart disease  - EKG 12-Lead  17. Screening for AAA (aortic abdominal aneurysm)  - [place], RETROPERITNL ABD,  LTD  18. FHx: heart disease  - EKG 12-Lead - [place], RETROPERITNL ABD,  LTD  19. Medication management  - Urinalysis, Routine w reflex microscopic - Microalbumin / creatinine urine ratio - Vitamin B12 - CBC with Differential/Platelet - COMPLETE METABOLIC PANEL WITH GFR - Magnesium - Lipid panel - TSH - Hemoglobin A1c - Insulin, random - VITAMIN D 25 Hydroxy          Mathew Miller was counseled in prudent diet, weight control to achieve/maintain BMI less than 25, BP monitoring, regular exercise and medications as discussed.  It is advised that he procure a semi-electric hospital bed with half rails to avoid falling out of bed. Discussed med effects and SE's. Routine screening labs and tests as requested with regular follow-up as recommended. Over 40 minutes of exam, counseling, chart review and high complex critical decision making was performed   Kirtland Bouchard, MD

## 2021-06-22 NOTE — Patient Instructions (Signed)

## 2021-06-23 ENCOUNTER — Ambulatory Visit (INDEPENDENT_AMBULATORY_CARE_PROVIDER_SITE_OTHER): Payer: Medicare HMO | Admitting: Internal Medicine

## 2021-06-23 ENCOUNTER — Other Ambulatory Visit: Payer: Self-pay

## 2021-06-23 VITALS — BP 96/70 | HR 74 | Temp 97.3°F | Resp 12 | Ht 74.0 in | Wt 150.0 lb

## 2021-06-23 DIAGNOSIS — I951 Orthostatic hypotension: Secondary | ICD-10-CM

## 2021-06-23 DIAGNOSIS — Z125 Encounter for screening for malignant neoplasm of prostate: Secondary | ICD-10-CM

## 2021-06-23 DIAGNOSIS — I639 Cerebral infarction, unspecified: Secondary | ICD-10-CM

## 2021-06-23 DIAGNOSIS — Z9181 History of falling: Secondary | ICD-10-CM

## 2021-06-23 DIAGNOSIS — R7309 Other abnormal glucose: Secondary | ICD-10-CM

## 2021-06-23 DIAGNOSIS — Z8249 Family history of ischemic heart disease and other diseases of the circulatory system: Secondary | ICD-10-CM

## 2021-06-23 DIAGNOSIS — Z136 Encounter for screening for cardiovascular disorders: Secondary | ICD-10-CM | POA: Diagnosis not present

## 2021-06-23 DIAGNOSIS — E782 Mixed hyperlipidemia: Secondary | ICD-10-CM | POA: Diagnosis not present

## 2021-06-23 DIAGNOSIS — Z0000 Encounter for general adult medical examination without abnormal findings: Secondary | ICD-10-CM | POA: Diagnosis not present

## 2021-06-23 DIAGNOSIS — N3281 Overactive bladder: Secondary | ICD-10-CM

## 2021-06-23 DIAGNOSIS — F0150 Vascular dementia without behavioral disturbance: Secondary | ICD-10-CM

## 2021-06-23 DIAGNOSIS — N138 Other obstructive and reflux uropathy: Secondary | ICD-10-CM | POA: Diagnosis not present

## 2021-06-23 DIAGNOSIS — Z79899 Other long term (current) drug therapy: Secondary | ICD-10-CM

## 2021-06-23 DIAGNOSIS — E538 Deficiency of other specified B group vitamins: Secondary | ICD-10-CM | POA: Diagnosis not present

## 2021-06-23 DIAGNOSIS — R0989 Other specified symptoms and signs involving the circulatory and respiratory systems: Secondary | ICD-10-CM

## 2021-06-23 DIAGNOSIS — E559 Vitamin D deficiency, unspecified: Secondary | ICD-10-CM

## 2021-06-23 DIAGNOSIS — Z1211 Encounter for screening for malignant neoplasm of colon: Secondary | ICD-10-CM

## 2021-06-23 DIAGNOSIS — Z0001 Encounter for general adult medical examination with abnormal findings: Secondary | ICD-10-CM

## 2021-06-23 DIAGNOSIS — R2681 Unsteadiness on feet: Secondary | ICD-10-CM

## 2021-06-23 DIAGNOSIS — N401 Enlarged prostate with lower urinary tract symptoms: Secondary | ICD-10-CM | POA: Diagnosis not present

## 2021-06-23 DIAGNOSIS — Z1212 Encounter for screening for malignant neoplasm of rectum: Secondary | ICD-10-CM

## 2021-06-24 ENCOUNTER — Other Ambulatory Visit: Payer: Self-pay | Admitting: Internal Medicine

## 2021-06-24 DIAGNOSIS — R972 Elevated prostate specific antigen [PSA]: Secondary | ICD-10-CM

## 2021-06-24 LAB — COMPLETE METABOLIC PANEL WITH GFR
AG Ratio: 1.7 (calc) (ref 1.0–2.5)
ALT: 12 U/L (ref 9–46)
AST: 13 U/L (ref 10–35)
Albumin: 3.9 g/dL (ref 3.6–5.1)
Alkaline phosphatase (APISO): 98 U/L (ref 35–144)
BUN/Creatinine Ratio: 16 (calc) (ref 6–22)
BUN: 23 mg/dL (ref 7–25)
CO2: 30 mmol/L (ref 20–32)
Calcium: 9.7 mg/dL (ref 8.6–10.3)
Chloride: 105 mmol/L (ref 98–110)
Creat: 1.47 mg/dL — ABNORMAL HIGH (ref 0.70–1.28)
Globulin: 2.3 g/dL (calc) (ref 1.9–3.7)
Glucose, Bld: 98 mg/dL (ref 65–99)
Potassium: 4.2 mmol/L (ref 3.5–5.3)
Sodium: 142 mmol/L (ref 135–146)
Total Bilirubin: 0.6 mg/dL (ref 0.2–1.2)
Total Protein: 6.2 g/dL (ref 6.1–8.1)
eGFR: 49 mL/min/{1.73_m2} — ABNORMAL LOW (ref >=60)

## 2021-06-24 LAB — CBC WITH DIFFERENTIAL/PLATELET
Absolute Monocytes: 713 cells/uL (ref 200–950)
Basophils Absolute: 65 cells/uL (ref 0–200)
Basophils Relative: 0.6 %
Eosinophils Absolute: 162 cells/uL (ref 15–500)
Eosinophils Relative: 1.5 %
HCT: 43.4 % (ref 38.5–50.0)
Hemoglobin: 14.7 g/dL (ref 13.2–17.1)
Lymphs Abs: 1037 cells/uL (ref 850–3900)
MCH: 30.6 pg (ref 27.0–33.0)
MCHC: 33.9 g/dL (ref 32.0–36.0)
MCV: 90.2 fL (ref 80.0–100.0)
MPV: 10.3 fL (ref 7.5–12.5)
Monocytes Relative: 6.6 %
Neutro Abs: 8824 cells/uL — ABNORMAL HIGH (ref 1500–7800)
Neutrophils Relative %: 81.7 %
Platelets: 207 10*3/uL (ref 140–400)
RBC: 4.81 10*6/uL (ref 4.20–5.80)
RDW: 13 % (ref 11.0–15.0)
Total Lymphocyte: 9.6 %
WBC: 10.8 10*3/uL (ref 3.8–10.8)

## 2021-06-24 LAB — LIPID PANEL
Cholesterol: 132 mg/dL (ref <200)
HDL: 56 mg/dL (ref >=40)
LDL Cholesterol (Calc): 62 mg/dL (calc)
Non-HDL Cholesterol (Calc): 76 mg/dL (calc) (ref <130)
Total CHOL/HDL Ratio: 2.4 (calc) (ref <5.0)
Triglycerides: 64 mg/dL (ref <150)

## 2021-06-24 LAB — TSH: TSH: 3 mIU/L (ref 0.40–4.50)

## 2021-06-24 LAB — VITAMIN B12: Vitamin B-12: 727 pg/mL (ref 200–1100)

## 2021-06-24 LAB — PSA: PSA: 12.1 ng/mL — ABNORMAL HIGH (ref <=4.00)

## 2021-06-24 LAB — MAGNESIUM: Magnesium: 2.1 mg/dL (ref 1.5–2.5)

## 2021-06-24 LAB — HEMOGLOBIN A1C
Hgb A1c MFr Bld: 5 % of total Hgb (ref <5.7)
Mean Plasma Glucose: 97 mg/dL
eAG (mmol/L): 5.4 mmol/L

## 2021-06-24 LAB — INSULIN, RANDOM: Insulin: 6.1 u[IU]/mL

## 2021-06-24 LAB — VITAMIN D 25 HYDROXY (VIT D DEFICIENCY, FRACTURES): Vit D, 25-Hydroxy: 98 ng/mL (ref 30–100)

## 2021-06-24 NOTE — Progress Notes (Signed)
============================================================ -   Test results slightly outside the reference range are not unusual. If there is anything important, I will review this with you,  otherwise it is considered normal test values.  If you have further questions,  please do not hesitate to contact me at the office or via My Chart.  ============================================================ ============================================================  PSA up from 5.6    1 year ago to now      12.10   -  As discussed  virtually all labs returned OK except the elevated PSA . So please call office in am  to schedule an appointment to come in for a lab test ( Free & Total PSA and a U/A & Culture ).  - Then pending on results  , if just infection can treat , or if not will send back to Urologists - Dr's Louis Meckel & Dahlstedt ============================================================ ============================================================  -  Vitamin B12 - Normal & OK - Please keep dose same ============================================================ ============================================================  -  Total Chol = 132  & LDL Chol = 62 - Both  Excellent   - Very low risk for Heart Attack  / Stroke ============================================================ ============================================================  -  A1c - Normal - No Diabetes - Great ! ============================================================ ============================================================  -  Vitamin D = 98 - Excellent - Please keep dose same  ============================================================ ============================================================  -  All Else - CBC - Kidneys - Electrolytes - Liver - Magnesium & Thyroid    - all  Normal /  OK ============================================================ ============================================================

## 2021-06-25 ENCOUNTER — Other Ambulatory Visit: Payer: Self-pay

## 2021-06-25 ENCOUNTER — Ambulatory Visit (INDEPENDENT_AMBULATORY_CARE_PROVIDER_SITE_OTHER): Payer: Medicare HMO

## 2021-06-25 DIAGNOSIS — R972 Elevated prostate specific antigen [PSA]: Secondary | ICD-10-CM | POA: Diagnosis not present

## 2021-06-25 DIAGNOSIS — R300 Dysuria: Secondary | ICD-10-CM | POA: Diagnosis not present

## 2021-06-25 NOTE — Progress Notes (Signed)
Patient presents today for a NV to recheck UA and PSA. Unable to get weight today.

## 2021-06-26 LAB — URINE CULTURE
MICRO NUMBER:: 12348339
SPECIMEN QUALITY:: ADEQUATE

## 2021-06-26 LAB — URINALYSIS, ROUTINE W REFLEX MICROSCOPIC
Bilirubin Urine: NEGATIVE
Glucose, UA: NEGATIVE
Hgb urine dipstick: NEGATIVE
Ketones, ur: NEGATIVE
Leukocytes,Ua: NEGATIVE
Nitrite: NEGATIVE
Protein, ur: NEGATIVE
Specific Gravity, Urine: 1.021 (ref 1.001–1.035)
pH: 7 (ref 5.0–8.0)

## 2021-06-26 LAB — PSA, TOTAL AND FREE
PSA, Free: 2.3 ng/mL
PSA, Total: 12.5 ng/mL — ABNORMAL HIGH (ref <=4.0)

## 2021-06-28 ENCOUNTER — Other Ambulatory Visit: Payer: Self-pay | Admitting: Internal Medicine

## 2021-06-28 DIAGNOSIS — R972 Elevated prostate specific antigen [PSA]: Secondary | ICD-10-CM

## 2021-06-28 NOTE — Progress Notes (Signed)
============================================================ -   Test results slightly outside the reference range are not unusual. If there is anything important, I will review this with you,  otherwise it is considered normal test values.  If you have further questions,  please do not hesitate to contact me at the office or via My Chart.  ============================================================ ============================================================  -  Repeat PSA - Still very Elevated   & Ur culture is Negative  ( No Infection)   - So requesting follow up app with Dr Diona Fanti  He is due for                                                                     his annual follow -up appointment  ============================================================ ============================================================

## 2021-07-01 DIAGNOSIS — I69354 Hemiplegia and hemiparesis following cerebral infarction affecting left non-dominant side: Secondary | ICD-10-CM | POA: Diagnosis not present

## 2021-07-06 ENCOUNTER — Encounter: Payer: Medicare HMO | Admitting: Internal Medicine

## 2021-07-08 DIAGNOSIS — R972 Elevated prostate specific antigen [PSA]: Secondary | ICD-10-CM | POA: Diagnosis not present

## 2021-07-08 DIAGNOSIS — R351 Nocturia: Secondary | ICD-10-CM | POA: Diagnosis not present

## 2021-07-08 DIAGNOSIS — N401 Enlarged prostate with lower urinary tract symptoms: Secondary | ICD-10-CM | POA: Diagnosis not present

## 2021-07-16 ENCOUNTER — Other Ambulatory Visit: Payer: Self-pay

## 2021-07-16 MED ORDER — MIRABEGRON ER 50 MG PO TB24: ORAL_TABLET | ORAL | 1 refills | Status: DC

## 2021-07-20 ENCOUNTER — Other Ambulatory Visit: Payer: Self-pay | Admitting: Neurology

## 2021-07-31 DIAGNOSIS — I69354 Hemiplegia and hemiparesis following cerebral infarction affecting left non-dominant side: Secondary | ICD-10-CM | POA: Diagnosis not present

## 2021-07-31 DIAGNOSIS — R351 Nocturia: Secondary | ICD-10-CM | POA: Diagnosis not present

## 2021-08-05 ENCOUNTER — Other Ambulatory Visit: Payer: Self-pay | Admitting: Cardiology

## 2021-08-09 ENCOUNTER — Other Ambulatory Visit: Payer: Self-pay | Admitting: Internal Medicine

## 2021-08-09 MED ORDER — MIRABEGRON ER 50 MG PO TB24: ORAL_TABLET | ORAL | 3 refills | Status: DC

## 2021-08-09 NOTE — Progress Notes (Signed)
This encounter was created in error - please disregard.

## 2021-08-10 ENCOUNTER — Encounter: Payer: Self-pay | Admitting: Cardiology

## 2021-08-10 ENCOUNTER — Other Ambulatory Visit: Payer: Self-pay

## 2021-08-10 ENCOUNTER — Encounter: Payer: Medicare HMO | Admitting: Cardiology

## 2021-08-10 VITALS — Ht 74.0 in | Wt 160.0 lb

## 2021-08-10 DIAGNOSIS — R9431 Abnormal electrocardiogram [ECG] [EKG]: Secondary | ICD-10-CM

## 2021-08-10 DIAGNOSIS — I951 Orthostatic hypotension: Secondary | ICD-10-CM

## 2021-08-10 DIAGNOSIS — I10 Essential (primary) hypertension: Secondary | ICD-10-CM

## 2021-08-11 ENCOUNTER — Telehealth: Payer: Self-pay | Admitting: Internal Medicine

## 2021-08-11 NOTE — Chronic Care Management (AMB) (Signed)
  Chronic Care Management   Outreach Note  08/11/2021 Name: Mathew Miller. MRN: [id number] DOB: 1946/06/26  Referred by: Unk Pinto, MD Reason for referral : No chief complaint on file.   An unsuccessful telephone outreach was attempted today. The patient was referred to the pharmacist for assistance with care management and care coordination.   Follow Up Plan:   Tatjana Dellinger Upstream Scheduler

## 2021-08-13 ENCOUNTER — Telehealth: Payer: Self-pay | Admitting: Internal Medicine

## 2021-08-13 NOTE — Telephone Encounter (Signed)
   [name] DOB: [birth year] MRN: [id number]   RIDER WAIVER AND RELEASE OF LIABILITY  For purposes of improving physical access to our facilities, [facility] is pleased to partner with third parties to provide [facility] patients or other authorized individuals the option of convenient, on-demand ground transportation services (the [place]") through use of the technology service that enables users to request on-demand ground transportation from independent third-party providers.  By opting to use and accept these [employer], I, the undersigned, hereby agree on behalf of myself, and on behalf of any minor child using the Government social research officer for whom I am the parent or legal guardian, as follows:  Government social research officer provided to me are provided by independent third-party transportation providers who are not [employer] or employees and who are unaffiliated with [employer]. [facility] is neither a transportation carrier nor a common or public carrier. [facility] has no control over the quality or safety of the transportation that occurs as a result of the [employer]. [facility] cannot guarantee that any third-party transportation provider will complete any arranged transportation service. [facility] makes no representation, warranty, or guarantee regarding the reliability, timeliness, quality, safety, suitability, or availability of any of the Transport Services or that they will be error free. I fully understand that traveling by vehicle involves risks and dangers of serious bodily injury, including permanent disability, paralysis, and death. I agree, on behalf of myself and on behalf of any minor child using the Transport Services for whom I am the parent or legal guardian, that the entire risk arising out of my use of the [employer] remains solely with me, to the maximum extent permitted under applicable law. The [employer] are provided  "as is" and "as available." [facility] disclaims all representations and warranties, express, implied or statutory, not expressly set out in these terms, including the implied warranties of merchantability and fitness for a particular purpose. I hereby waive and release [facility], its agents, employees, officers, directors, representatives, insurers, attorneys, assigns, successors, subsidiaries, and affiliates from any and all past, present, or future claims, demands, liabilities, actions, causes of action, or suits of any kind directly or indirectly arising from acceptance and use of the [employer]. I further waive and release [facility] and its affiliates from all present and future liability and responsibility for any injury or death to persons or damages to property caused by or related to the use of the [employer]. I have read this Waiver and Release of Liability, and I understand the terms used in it and their legal significance. This Waiver is freely and voluntarily given with the understanding that my right (as well as the right of any minor child for whom I am the parent or legal guardian using the [employer]) to legal recourse against [facility] in connection with the [employer] is knowingly surrendered in return for use of these services.   I attest that I read the consent document to [name], gave Mr. Lonon the opportunity to ask questions and answered the questions asked (if any). I affirm that [name] then provided consent for he's participation in this program.     Legrand Pitts, Wife gave consent on behalf of patient

## 2021-08-18 ENCOUNTER — Observation Stay (HOSPITAL_COMMUNITY)
Admission: EM | Admit: 2021-08-18 | Discharge: 2021-08-20 | Disposition: A | Discharge status: Home / Self Care | Payer: Medicare HMO | Attending: Internal Medicine | Admitting: Internal Medicine

## 2021-08-18 ENCOUNTER — Emergency Department (HOSPITAL_COMMUNITY): Payer: Medicare HMO

## 2021-08-18 DIAGNOSIS — R509 Fever, unspecified: Secondary | ICD-10-CM | POA: Diagnosis not present

## 2021-08-18 DIAGNOSIS — N390 Urinary tract infection, site not specified: Principal | ICD-10-CM | POA: Insufficient documentation

## 2021-08-18 DIAGNOSIS — E785 Hyperlipidemia, unspecified: Secondary | ICD-10-CM | POA: Diagnosis present

## 2021-08-18 DIAGNOSIS — N179 Acute kidney failure, unspecified: Secondary | ICD-10-CM

## 2021-08-18 DIAGNOSIS — Z79899 Other long term (current) drug therapy: Secondary | ICD-10-CM | POA: Insufficient documentation

## 2021-08-18 DIAGNOSIS — R531 Weakness: Secondary | ICD-10-CM | POA: Diagnosis not present

## 2021-08-18 DIAGNOSIS — Z7982 Long term (current) use of aspirin: Secondary | ICD-10-CM | POA: Diagnosis not present

## 2021-08-18 DIAGNOSIS — F0390 Unspecified dementia without behavioral disturbance: Secondary | ICD-10-CM

## 2021-08-18 DIAGNOSIS — Z20822 Contact with and (suspected) exposure to covid-19: Secondary | ICD-10-CM | POA: Diagnosis not present

## 2021-08-18 DIAGNOSIS — N189 Chronic kidney disease, unspecified: Secondary | ICD-10-CM

## 2021-08-18 DIAGNOSIS — N1831 Chronic kidney disease, stage 3a: Secondary | ICD-10-CM | POA: Diagnosis not present

## 2021-08-18 DIAGNOSIS — R4182 Altered mental status, unspecified: Secondary | ICD-10-CM

## 2021-08-18 DIAGNOSIS — R404 Transient alteration of awareness: Secondary | ICD-10-CM | POA: Diagnosis not present

## 2021-08-18 DIAGNOSIS — M549 Dorsalgia, unspecified: Secondary | ICD-10-CM

## 2021-08-18 DIAGNOSIS — R7303 Prediabetes: Secondary | ICD-10-CM | POA: Diagnosis not present

## 2021-08-18 DIAGNOSIS — Z87898 Personal history of other specified conditions: Secondary | ICD-10-CM

## 2021-08-18 DIAGNOSIS — I129 Hypertensive chronic kidney disease with stage 1 through stage 4 chronic kidney disease, or unspecified chronic kidney disease: Secondary | ICD-10-CM | POA: Diagnosis not present

## 2021-08-18 DIAGNOSIS — Z8673 Personal history of transient ischemic attack (TIA), and cerebral infarction without residual deficits: Secondary | ICD-10-CM | POA: Insufficient documentation

## 2021-08-18 DIAGNOSIS — A419 Sepsis, unspecified organism: Secondary | ICD-10-CM | POA: Diagnosis present

## 2021-08-18 DIAGNOSIS — R109 Unspecified abdominal pain: Secondary | ICD-10-CM | POA: Insufficient documentation

## 2021-08-18 DIAGNOSIS — R410 Disorientation, unspecified: Secondary | ICD-10-CM | POA: Diagnosis not present

## 2021-08-18 DIAGNOSIS — R2681 Unsteadiness on feet: Secondary | ICD-10-CM | POA: Diagnosis not present

## 2021-08-18 DIAGNOSIS — I10 Essential (primary) hypertension: Secondary | ICD-10-CM | POA: Diagnosis not present

## 2021-08-18 LAB — COMPREHENSIVE METABOLIC PANEL
ALT: 12 U/L (ref 0–44)
AST: 22 U/L (ref 15–41)
Albumin: 3.5 g/dL (ref 3.5–5.0)
Alkaline Phosphatase: 82 U/L (ref 38–126)
Anion gap: 11 (ref 5–15)
BUN: 31 mg/dL — ABNORMAL HIGH (ref 8–23)
CO2: 21 mmol/L — ABNORMAL LOW (ref 22–32)
Calcium: 9.2 mg/dL (ref 8.9–10.3)
Chloride: 107 mmol/L (ref 98–111)
Creatinine, Ser: 1.67 mg/dL — ABNORMAL HIGH (ref 0.61–1.24)
GFR, Estimated: 42 mL/min — ABNORMAL LOW (ref >60)
Glucose, Bld: 153 mg/dL — ABNORMAL HIGH (ref 70–99)
Potassium: 4.5 mmol/L (ref 3.5–5.1)
Sodium: 139 mmol/L (ref 135–145)
Total Bilirubin: 1.4 mg/dL — ABNORMAL HIGH (ref 0.3–1.2)
Total Protein: 5.9 g/dL — ABNORMAL LOW (ref 6.5–8.1)

## 2021-08-18 LAB — CBC WITH DIFFERENTIAL/PLATELET
Abs Immature Granulocytes: 0.1 10*3/uL — ABNORMAL HIGH (ref 0.00–0.07)
Basophils Absolute: 0 10*3/uL (ref 0.0–0.1)
Basophils Relative: 0 %
Eosinophils Absolute: 0 10*3/uL (ref 0.0–0.5)
Eosinophils Relative: 0 %
HCT: 46.1 % (ref 39.0–52.0)
Hemoglobin: 15.4 g/dL (ref 13.0–17.0)
Immature Granulocytes: 1 %
Lymphocytes Relative: 4 %
Lymphs Abs: 0.7 10*3/uL (ref 0.7–4.0)
MCH: 30.8 pg (ref 26.0–34.0)
MCHC: 33.4 g/dL (ref 30.0–36.0)
MCV: 92.2 fL (ref 80.0–100.0)
Monocytes Absolute: 0.9 10*3/uL (ref 0.1–1.0)
Monocytes Relative: 5 %
Neutro Abs: 16.6 10*3/uL — ABNORMAL HIGH (ref 1.7–7.7)
Neutrophils Relative %: 90 %
Platelets: 186 10*3/uL (ref 150–400)
RBC: 5 MIL/uL (ref 4.22–5.81)
RDW: 14 % (ref 11.5–15.5)
WBC: 18.3 10*3/uL — ABNORMAL HIGH (ref 4.0–10.5)
nRBC: 0 % (ref 0.0–0.2)

## 2021-08-18 LAB — LACTIC ACID, PLASMA: Lactic Acid, Venous: 2 mmol/L (ref 0.5–1.9)

## 2021-08-18 MED ORDER — LACTATED RINGERS IV BOLUS
1000.0000 mL | Freq: Once | INTRAVENOUS | Status: AC
  Administered 2021-08-19: 1000 mL via INTRAVENOUS

## 2021-08-18 MED ORDER — SODIUM CHLORIDE 0.9 % IV SOLN
2.0000 g | Freq: Once | INTRAVENOUS | Status: AC
  Administered 2021-08-19: 2 g via INTRAVENOUS
  Filled 2021-08-18: qty 20

## 2021-08-18 NOTE — ED Provider Notes (Signed)
Emergency Medicine Provider Triage Evaluation Note  Mathew Miller. , a 75 y.o. male  was evaluated in triage.  Pt complains of abdominal pain, brought in by EMS with report Hot to the touch with strong urine odor.  Review of Systems  Positive: Abdominal pain Negative:   Physical Exam  BP 139/82 (BP Location: Left Arm)   Pulse 87   Temp 99.6 F (37.6 C) (Oral)   Resp 14   SpO2 99%  Gen:   Awake, no distress   Resp:  Normal effort  MSK:   Moves extremities without difficulty  Other:  Alert to voice, abdomen non tender  Medical Decision Making  Medically screening exam initiated at 11:17 AM.  Appropriate orders placed.  [name] was informed that the remainder of the evaluation will be completed by another provider, this initial triage assessment does not replace that evaluation, and the importance of remaining in the ED until their evaluation is complete.     Tacy Learn, PA-C 08/18/21 1118    Truddie Hidden, MD 08/18/21 [phone]

## 2021-08-18 NOTE — ED Triage Notes (Signed)
EMS stated, 20 g left hand , hot to touch , UTI smell . Alert to voice , He has left side defiecet due to stroke last year, wife is concerned

## 2021-08-19 ENCOUNTER — Observation Stay (HOSPITAL_COMMUNITY): Payer: Medicare HMO

## 2021-08-19 ENCOUNTER — Encounter (HOSPITAL_COMMUNITY): Payer: Self-pay | Admitting: Emergency Medicine

## 2021-08-19 DIAGNOSIS — I700 Atherosclerosis of aorta: Secondary | ICD-10-CM | POA: Diagnosis not present

## 2021-08-19 DIAGNOSIS — N179 Acute kidney failure, unspecified: Secondary | ICD-10-CM

## 2021-08-19 DIAGNOSIS — A419 Sepsis, unspecified organism: Secondary | ICD-10-CM

## 2021-08-19 DIAGNOSIS — G319 Degenerative disease of nervous system, unspecified: Secondary | ICD-10-CM | POA: Diagnosis not present

## 2021-08-19 DIAGNOSIS — R109 Unspecified abdominal pain: Secondary | ICD-10-CM | POA: Diagnosis not present

## 2021-08-19 DIAGNOSIS — I6782 Cerebral ischemia: Secondary | ICD-10-CM | POA: Diagnosis not present

## 2021-08-19 DIAGNOSIS — S0993XA Unspecified injury of face, initial encounter: Secondary | ICD-10-CM | POA: Diagnosis not present

## 2021-08-19 DIAGNOSIS — N189 Chronic kidney disease, unspecified: Secondary | ICD-10-CM

## 2021-08-19 LAB — URINALYSIS, ROUTINE W REFLEX MICROSCOPIC
Bilirubin Urine: NEGATIVE
Glucose, UA: NEGATIVE mg/dL
Hgb urine dipstick: NEGATIVE
Ketones, ur: 5 mg/dL — AB
Leukocytes,Ua: NEGATIVE
Nitrite: NEGATIVE
Protein, ur: NEGATIVE mg/dL
Specific Gravity, Urine: 1.026 (ref 1.005–1.030)
pH: 5 (ref 5.0–8.0)

## 2021-08-19 LAB — PROTIME-INR
INR: 1.1 (ref 0.8–1.2)
Prothrombin Time: 14.4 seconds (ref 11.4–15.2)

## 2021-08-19 LAB — BASIC METABOLIC PANEL
Anion gap: 11 (ref 5–15)
BUN: 31 mg/dL — ABNORMAL HIGH (ref 8–23)
CO2: 23 mmol/L (ref 22–32)
Calcium: 9.4 mg/dL (ref 8.9–10.3)
Chloride: 107 mmol/L (ref 98–111)
Creatinine, Ser: 1.47 mg/dL — ABNORMAL HIGH (ref 0.61–1.24)
GFR, Estimated: 49 mL/min — ABNORMAL LOW (ref >60)
Glucose, Bld: 136 mg/dL — ABNORMAL HIGH (ref 70–99)
Potassium: 4.2 mmol/L (ref 3.5–5.1)
Sodium: 141 mmol/L (ref 135–145)

## 2021-08-19 LAB — RESP PANEL BY RT-PCR (FLU A&B, COVID) ARPGX2
Influenza A by PCR: NEGATIVE
Influenza B by PCR: NEGATIVE
SARS Coronavirus 2 by RT PCR: NEGATIVE

## 2021-08-19 LAB — APTT: aPTT: 28 seconds (ref 24–36)

## 2021-08-19 LAB — CBC
HCT: 48 % (ref 39.0–52.0)
Hemoglobin: 15.2 g/dL (ref 13.0–17.0)
MCH: 29.9 pg (ref 26.0–34.0)
MCHC: 31.7 g/dL (ref 30.0–36.0)
MCV: 94.3 fL (ref 80.0–100.0)
Platelets: 179 10*3/uL (ref 150–400)
RBC: 5.09 MIL/uL (ref 4.22–5.81)
RDW: 14.1 % (ref 11.5–15.5)
WBC: 13.9 10*3/uL — ABNORMAL HIGH (ref 4.0–10.5)
nRBC: 0 % (ref 0.0–0.2)

## 2021-08-19 LAB — LACTIC ACID, PLASMA: Lactic Acid, Venous: 2.4 mmol/L (ref 0.5–1.9)

## 2021-08-19 MED ORDER — VANCOMYCIN HCL IN DEXTROSE 1-5 GM/200ML-% IV SOLN
1000.0000 mg | INTRAVENOUS | Status: DC
  Administered 2021-08-20: 1000 mg via INTRAVENOUS
  Filled 2021-08-19 (×2): qty 200

## 2021-08-19 MED ORDER — ACETAMINOPHEN 325 MG PO TABS: 650.0000 mg | ORAL_TABLET | Freq: Four times a day (QID) | ORAL | Status: DC | PRN

## 2021-08-19 MED ORDER — DONEPEZIL HCL 10 MG PO TABS
10.0000 mg | ORAL_TABLET | Freq: Every day | ORAL | Status: DC
  Administered 2021-08-19 (×2): 10 mg via ORAL
  Filled 2021-08-19 (×3): qty 1

## 2021-08-19 MED ORDER — VANCOMYCIN HCL 1500 MG/300ML IV SOLN
1500.0000 mg | Freq: Once | INTRAVENOUS | Status: AC
  Administered 2021-08-19: 1500 mg via INTRAVENOUS
  Filled 2021-08-19: qty 300

## 2021-08-19 MED ORDER — SODIUM CHLORIDE 0.9 % IV SOLN
2.0000 g | INTRAVENOUS | Status: DC
  Administered 2021-08-20: 2 g via INTRAVENOUS
  Filled 2021-08-19: qty 20

## 2021-08-19 MED ORDER — ROSUVASTATIN CALCIUM 5 MG PO TABS
5.0000 mg | ORAL_TABLET | Freq: Every day | ORAL | Status: DC
  Administered 2021-08-19 – 2021-08-20 (×2): 5 mg via ORAL
  Filled 2021-08-19 (×2): qty 1

## 2021-08-19 MED ORDER — SODIUM CHLORIDE 0.9 % IV SOLN: INTRAVENOUS | Status: AC

## 2021-08-19 MED ORDER — VITAMIN D 25 MCG (1000 UNIT) PO TABS
5000.0000 [IU] | ORAL_TABLET | Freq: Every day | ORAL | Status: DC
  Administered 2021-08-19 – 2021-08-20 (×2): 5000 [IU] via ORAL
  Filled 2021-08-19 (×2): qty 5

## 2021-08-19 MED ORDER — PYRIDOSTIGMINE BROMIDE 60 MG PO TABS
60.0000 mg | ORAL_TABLET | Freq: Two times a day (BID) | ORAL | Status: DC
  Administered 2021-08-19 – 2021-08-20 (×4): 60 mg via ORAL
  Filled 2021-08-19 (×7): qty 1

## 2021-08-19 MED ORDER — ASPIRIN EC 81 MG PO TBEC
81.0000 mg | DELAYED_RELEASE_TABLET | Freq: Every day | ORAL | Status: DC
  Administered 2021-08-19 – 2021-08-20 (×2): 81 mg via ORAL
  Filled 2021-08-19 (×2): qty 1

## 2021-08-19 MED ORDER — ACETAMINOPHEN 650 MG RE SUPP: 650.0000 mg | Freq: Four times a day (QID) | RECTAL | Status: DC | PRN

## 2021-08-19 MED ORDER — MIRABEGRON ER 50 MG PO TB24
50.0000 mg | ORAL_TABLET | Freq: Every day | ORAL | Status: DC
  Administered 2021-08-19 – 2021-08-20 (×2): 50 mg via ORAL
  Filled 2021-08-19 (×2): qty 1

## 2021-08-19 NOTE — Progress Notes (Signed)
Pt admitted to 5W09 from ED. Alert to self; VS stable, Tele monitor verified. Skin intact except where charted. IV L hand, SL. Foley in place, CHG bath complete. Wife at bedside, all questions/concerns addressed. Call bell within reach.   08/19/21 1539  Vitals  Temp 97.9 F (36.6 C)  Temp Source Axillary  BP (!) 169/82  MAP (mmHg) 106  BP Location Left Arm  BP Method Automatic  Patient Position (if appropriate) Lying  Pulse Rate (!) 56  Pulse Rate Source Monitor  ECG Heart Rate (!) 56  Resp 20  Level of Consciousness  Level of Consciousness Alert  Oxygen Therapy  SpO2 98 %  Pain Assessment  Pain Score 0

## 2021-08-19 NOTE — Progress Notes (Signed)
Patient seen and examined at the bedside.  He is and unable to provide any history.  His wife was at the bedside.  His wife said that they did not have any sleep last night also has been tired.  His wife also reports that recent acute change in mental status worse than his baseline.  Additionally, she said patient has history of orthostatic hypotension and multisystem atrophy and has had recurrent falls at home.  Continue current treatment.  PT and OT evaluation.

## 2021-08-19 NOTE — ED Notes (Signed)
Breakfast Orders placed 

## 2021-08-19 NOTE — Progress Notes (Signed)
Pharmacy Antibiotic Note  Cincere Zorn. is a 75 y.o. male admitted on 08/18/2021 with sepsis.  Pharmacy has been consulted for vancomycin dosing.  Plan: Vancomycin 1500mg  x1 then 1000mg  IV Q24H. Goal AUC 400-550.  Expected AUC 480.  SCr 1.67.   Temp (24hrs), Avg:98.6 F (37 C), Min:98 F (36.7 C), Max:99.6 F (37.6 C)  Recent Labs  Lab 08/18/21 1107  WBC 18.3*  CREATININE 1.67*  LATICACIDVEN 2.0*    Estimated Creatinine Clearance: 38 mL/min (A) (by C-G formula based on SCr of 1.67 mg/dL (H)).    Allergies  Allergen Reactions   Hydrocodone-Acetaminophen Nausea Only   Sudafed [Pseudoephedrine Hcl]     Urinary retention   Tetanus Toxoids     Itching, burning feeling, ran a fever     Thank you for allowing pharmacy to be a part of this patient's care.  Wynona Neat, PharmD, BCPS  08/19/2021 1:18 AM

## 2021-08-19 NOTE — ED Notes (Signed)
Patient resting on stretcher. No distress noted. Wife at bedside

## 2021-08-19 NOTE — Progress Notes (Signed)
PT Evaluation    08/19/21 1354  PT Visit Information  Last PT Received On 08/19/21  Assistance Needed +2  PT/OT/SLP Co-Evaluation/Treatment Yes  Reason for Co-Treatment Complexity of the patient's impairments (multi-system involvement);Necessary to address cognition/behavior during functional activity;For patient/therapist safety  PT goals addressed during session Mobility/safety with mobility;Balance  History of Present Illness 75 y.o. male admitted 11/1 secondary to sepsis. PMH includes hypertension,  prediabetes, CKD, stroke, progressive dementia, multiple system atrophy, dysautonomia with postural hypotension, wheelchair dependent and unable to ambulate independently due to profound hypotension with standing, sensorineural hearing loss, BPH.  Precautions  Precautions Fall  Precaution Comments Watch BP  Restrictions  Weight Bearing Restrictions No  Home Living  Family/patient expects to be discharged to: Private residence  Living Arrangements Spouse/significant other  Available Help at Discharge Family;Available 24 hours/day  Type of Home House  Home Access Ramped entrance  Home Layout Multi-level  Alternate Level Stairs-Number of Steps 5-6  Alternate Level Stairs-Rails Right;Left;Can reach both  Bathroom Shower/Tub Walk-in shower  [place] (2 wheels);Wheelchair - manual;Shower seat;Hospital bed  Prior Function  Prior Level of Function  Needs assist   Cognitive Assist  Mobility (cognitive);ADLs (cognitive)  Mobility (Cognitive) Intermittent cues  ADLs (Cognitive) Intermittent cues  Physical Assist  Mobility (physical);ADLs (physical)  Mobility (physical) Bed mobility;Transfers  ADLs (physical) Bathing;Dressing;Toileting;IADLs  Mobility Comments Spouse states patient continues to perform transfers to [facility] with assist and RW  ADLs Comments Spouse currently perfroming bed level ADL  Communication  Communication HOH  Pain  Assessment  Pain Assessment Faces  Faces Pain Scale 0  Cognition  Arousal/Alertness Lethargic  Behavior During Therapy Flat affect  Overall Cognitive Status Difficult to assess  General Comments Pt very lethargic throughout, briefly opening eyes, but then immediately went back to sleep.  Difficult to assess due to Level of arousal  Upper Extremity Assessment  Upper Extremity Assessment Defer to OT evaluation  Lower Extremity Assessment  Lower Extremity Assessment Difficult to assess due to impaired cognition  Cervical / Trunk Assessment  Cervical / Trunk Assessment Kyphotic  Bed Mobility  Overal bed mobility Needs Assistance  Bed Mobility Supine to Sit;Sit to Supine  Supine to sit Total assist;+2 for physical assistance  Sit to supine Total assist;+2 for physical assistance  General bed mobility comments Pt very lethargic. Brief period of eye opening in sitting, but quickly fell back to sleep.  Balance  Overall balance assessment Needs assistance  Sitting-balance support No upper extremity supported;Feet unsupported  Sitting balance-Leahy Scale Zero  Sitting balance - Comments total A to maintain sitting balance  Postural control Posterior lean;Left lateral lean  General Comments  General comments (skin integrity, edema, etc.) BP in supine at 188/84, BP in sitting at 141/71  PT - End of Session  Activity Tolerance Patient limited by lethargy  Patient left in bed;with family/visitor present (on stretcher in hallway in ED)  Nurse Communication Mobility status  PT Assessment  PT Recommendation/Assessment Patient needs continued PT services  PT Visit Diagnosis Muscle weakness (generalized) (M62.81);Difficulty in walking, not elsewhere classified (R26.2)  PT Problem List Decreased strength;Decreased activity tolerance;Decreased balance;Decreased mobility;Decreased knowledge of use of DME;Decreased safety awareness;Decreased cognition;Decreased knowledge of precautions  PT Plan  PT  Frequency (ACUTE ONLY) Min 2X/week  PT Treatment/Interventions (ACUTE ONLY) Gait training;DME instruction;Functional mobility training;Therapeutic activities;Therapeutic exercise;Balance training;Patient/family education;Cognitive remediation  AM-PAC PT "6 Clicks" Mobility Outcome Measure (Version 2)  Help needed turning from your back to your side while in  a flat bed without using bedrails? 1  Help needed moving from lying on your back to sitting on the side of a flat bed without using bedrails? 1  Help needed moving to and from a bed to a chair (including a wheelchair)? 1  Help needed standing up from a chair using your arms (e.g., wheelchair or bedside chair)? 1  Help needed to walk in hospital room? 1  Help needed climbing 3-5 steps with a railing?  1  6 Click Score 6  Consider Recommendation of Discharge To: CIR/SNF/LTACH  Progressive Mobility  What is the highest level of mobility based on the progressive mobility assessment? Level 1 (Bedfast) - Unable to balance while sitting on edge of bed  Mobility Sit up in bed/chair position for meals  PT Recommendation  Follow Up Recommendations Skilled nursing-short term rehab (<3 hours/day)  Assistance recommended at discharge Frequent or constant Supervision/Assistance  Functional Status Assessment Patient has had a recent decline in their functional status and demonstrates the ability to make significant improvements in function in a reasonable and predictable amount of time.  PT equipment Other (comment) (hoyer lift with pad)  Individuals Consulted  Consulted and Agree with Results and Recommendations Family member/caregiver;Patient  Family Member Consulted wife  Acute Rehab PT Goals  Patient Stated Goal to return home per wife  PT Goal Formulation With family  Time For Goal Achievement 09/02/21  Potential to Achieve Goals Good  PT Time Calculation  PT Start Time (ACUTE ONLY) 1118  PT Stop Time (ACUTE ONLY) 1137  PT Time Calculation  (min) (ACUTE ONLY) 19 min  PT General Charges  $$ ACUTE PT VISIT 1 Visit  PT Evaluation  $PT Eval Moderate Complexity 1 Mod   Pt admitted secondary to problem above with deficits below. Pt requiring total A +2 for bed mobility tasks. Pt opening eyes briefly when sitting EOB, but then quickly fell back to sleep. Further mobility unsafe to attempt. Per wife, she has to assist with transfers to [facility] and B[facility]. Sometimes pt able to ambulate short distances to bathroom. Given current deficits feel SNF level therapies most appropriate given decline in function, however, wife would like to take him home with HHPT if he progresses well. Will continue to follow acutely to maximize functional mobility independence and safety.   Reuel Derby, PT, DPT  Acute Rehabilitation Services  Pager: [phone] Office: [phone]

## 2021-08-19 NOTE — H&P (Addendum)
History and Physical    Mathew Miller. BWL:[id number] DOB: 02-Jul-1946 DOA: 08/18/2021  PCP: Unk Pinto, MD Patient coming from: Home  Chief Complaint: Strong urine odor  HPI: Mathew Miller. is a 75 y.o. male with medical history significant of hypertension, hyperlipidemia, prediabetes, vitamin D deficiency, CKD stage IIIa, history of stroke, progressive dementia, multiple system atrophy, dysautonomia with postural hypotension, wheelchair dependent and unable to ambulate independently due to profound hypotension with standing, fall risk, sensorineural hearing loss, BPH, overactive bladder presenting to the ED via EMS for evaluation of strong urine odor and patient feeling hot to touch.  Vital signs stable, not febrile.  Labs notable for WBC 18.3.  Lactic acid 2.0.  BUN 31, creatinine 1.6 (baseline 1.2-1.4).  UA and urine culture pending.  Blood culture pending.  Chest x-ray not suggestive of pneumonia. Patient was given vancomycin, ceftriaxone, and 1 L fluid bolus.  Patient is confused and not able to give any history.  Wife states patient is quite confused at baseline due to his dementia.  Since yesterday he has been sleeping a lot and not eating or drinking.  His urine has a strong odor.  States she took him to his primary care physician a week ago and they did a urinalysis and told her it was okay.  No cough or shortness of breath reported.  Patient has not complained of any abdominal pain.  He had an episode of nausea at home and vomited a very small amount of clear liquid.  No persistent vomiting.  No diarrhea.  Wife states his temperature was 100 F yesterday.  Review of Systems:  All systems reviewed and apart from history of presenting illness, are negative.  Past Medical History:  Diagnosis Date   Constipation    Hyperlipidemia    Hypertension    Prediabetes    no meds    Vitamin D deficiency     Past Surgical History:  Procedure Laterality Date   CATARACT  EXTRACTION, BILATERAL  2017   COLONOSCOPY     NO PAST SURGERIES       reports that he has never smoked. He has never used smokeless tobacco. He reports current alcohol use. He reports that he does not use drugs.  Allergies  Allergen Reactions   Hydrocodone-Acetaminophen Nausea Only   Sudafed [Pseudoephedrine Hcl]     Urinary retention   Tetanus Toxoids     Itching, burning feeling, ran a fever    Family History  Problem Relation Age of Onset   Hyperlipidemia Mother    Glaucoma Mother    Heart disease Father    Hypertension Father    Colon cancer Neg Hx    Esophageal cancer Neg Hx    Pancreatic cancer Neg Hx    Prostate cancer Neg Hx    Rectal cancer Neg Hx    Stomach cancer Neg Hx     Prior to Admission medications   Medication Sig Start Date End Date Taking? Authorizing Provider  aspirin EC 81 MG tablet Take 81 mg by mouth daily. Swallow whole.    [provider]  BELSOMRA 15 MG TABS  06/20/21   [provider]  cholecalciferol (VITAMIN D3) 25 MCG (1000 UNIT) tablet Take 5,000 Units by mouth daily.    [provider]  Cyanocobalamin (B-12 PO) Take by mouth daily.    [provider]  donepezil (ARICEPT) 10 MG tablet TAKE 1 TABLET AT BEDTIME 07/21/21   Pieter Partridge, DO  Magnesium  250 MG TABS Take 250 mg by mouth daily.    [provider]  mirabegron ER (MYRBETRIQ) 50 MG TB24 tablet Take 1 tablet Daily for OverActive Bladder 08/09/21   Unk Pinto, MD  polyethylene glycol (MIRALAX / GLYCOLAX) 17 g packet Take 17 g by mouth daily.    [provider]  potassium chloride SA (KLOR-CON) 20 MEQ tablet TAKE 1 TABLET TWICE DAILY  (FOR POTASSIUM REPLACEMENT) 01/14/21   Unk Pinto, MD  pyridostigmine (MESTINON) 60 MG tablet TAKE 1 TABLET IN THE MORNING AND AT BEDTIME 08/05/21   Sueanne Margarita, MD  rosuvastatin (CRESTOR) 5 MG tablet TAKE 1 TABLET EVERY DAY FOR CHOLESTEROL 12/22/20   Liane Comber, NP  senna-docusate (GNP  STOOL SOFTENER/LAXATIVE) 8.6-50 MG tablet Take 1 tablet Daily for Constipation 09/25/20   Unk Pinto, MD    Physical Exam: Vitals:   08/18/21 1102 08/18/21 1428 08/18/21 1752 08/18/21 2146  BP: 139/82 (!) 159/87 (!) 140/101 (!) 148/82  Pulse: 87 71 69 67  Resp: 14 15 14 16   Temp: 99.6 F (37.6 C) 98.7 F (37.1 C) 98.8 F (37.1 C) 98.1 F (36.7 C)  TempSrc: Oral Oral  Oral  SpO2: 99% 97% 100% 100%    Physical Exam Constitutional:      General: He is not in acute distress. HENT:     Head: Normocephalic and atraumatic.     Mouth/Throat:     Mouth: Mucous membranes are dry.  Cardiovascular:     Rate and Rhythm: Normal rate and regular rhythm.     Pulses: Normal pulses.  Pulmonary:     Effort: Pulmonary effort is normal. No respiratory distress.     Breath sounds: Normal breath sounds. No wheezing or rales.  Abdominal:     General: Bowel sounds are normal. There is no distension.     Palpations: Abdomen is soft.     Tenderness: There is no abdominal tenderness.  Musculoskeletal:        General: No swelling or tenderness.     Cervical back: Normal range of motion and neck supple.  Skin:    General: Skin is warm and dry.  Neurological:     General: No focal deficit present.     Mental Status: He is alert.     Comments: Oriented to self only     Labs on Admission: I have personally reviewed following labs and imaging studies  CBC: Recent Labs  Lab 08/18/21 1107  WBC 18.3*  NEUTROABS 16.6*  HGB 15.4  HCT 46.1  MCV 92.2  PLT 578   Basic Metabolic Panel: Recent Labs  Lab 08/18/21 1107  NA 139  K 4.5  CL 107  CO2 21*  GLUCOSE 153*  BUN 31*  CREATININE 1.67*  CALCIUM 9.2   GFR: Estimated Creatinine Clearance: 38 mL/min (A) (by C-G formula based on SCr of 1.67 mg/dL (H)). Liver Function Tests: Recent Labs  Lab 08/18/21 1107  AST 22  ALT 12  ALKPHOS 82  BILITOT 1.4*  PROT 5.9*  ALBUMIN 3.5   No results for input(s): LIPASE, AMYLASE in the  last 168 hours. No results for input(s): AMMONIA in the last 168 hours. Coagulation Profile: No results for input(s): INR, PROTIME in the last 168 hours. Cardiac Enzymes: No results for input(s): CKTOTAL, CKMB, CKMBINDEX, TROPONINI in the last 168 hours. BNP (last 3 results) No results for input(s): PROBNP in the last 8760 hours. HbA1C: No results for input(s): HGBA1C in the last 72 hours. CBG: No  results for input(s): GLUCAP in the last 168 hours. Lipid Profile: No results for input(s): CHOL, HDL, LDLCALC, TRIG, CHOLHDL, LDLDIRECT in the last 72 hours. Thyroid Function Tests: No results for input(s): TSH, T4TOTAL, FREET4, T3FREE, THYROIDAB in the last 72 hours. Anemia Panel: No results for input(s): VITAMINB12, FOLATE, FERRITIN, TIBC, IRON, RETICCTPCT in the last 72 hours. Urine analysis:    Component Value Date/Time   COLORURINE YELLOW 06/25/2021 1450   APPEARANCEUR CLEAR 06/25/2021 1450   LABSPEC 1.021 06/25/2021 1450   PHURINE 7.0 06/25/2021 1450   GLUCOSEU NEGATIVE 06/25/2021 1450   HGBUR NEGATIVE 06/25/2021 1450   BILIRUBINUR NEGATIVE 07/15/2020 2116   KETONESUR NEGATIVE 06/25/2021 1450   PROTEINUR NEGATIVE 06/25/2021 1450   UROBILINOGEN 1 06/12/2014 1156   NITRITE NEGATIVE 06/25/2021 1450   LEUKOCYTESUR NEGATIVE 06/25/2021 1450    Radiological Exams on Admission: DG Chest 2 View  Result Date: 08/18/2021 CLINICAL DATA:  75 year old male with weakness, fever. EXAM: CHEST - 2 VIEW COMPARISON:  Chest radiographs 07/26/2020 and earlier. FINDINGS: Upright AP and lateral views of the chest. Lung volumes are within normal limits. Mediastinal contours are stable, mildly tortuous thoracic aorta. Visualized tracheal air column is within normal limits. Both lungs appear clear. No pneumothorax or pleural effusion. Chronic left lateral 5th rib fracture appears stable. No acute osseous abnormality identified. Gas and retained stool in visible bowel in the upper abdomen. IMPRESSION: No  acute cardiopulmonary abnormality. Electronically Signed   By: Genevie Ann M.D.   On: 08/18/2021 11:52    EKG: Independently reviewed.  Sinus rhythm.  Assessment/Plan Principal Problem:   Sepsis ([place]) Active Problems:   Hyperlipidemia   History of prediabetes   Dementia ([place])   Acute-on-chronic kidney injury ([place])   Possible sepsis Concern for possible sepsis given low-grade fever at home. Labs showing leukocytosis and borderline lactic acidosis.  Possible urinary source given strong urine order, although urinalysis pending at this time.  Chest x-ray not suggestive of pneumonia.  No other obvious infectious source.  No meningeal signs. -Continue vancomycin and ceftriaxone at this time.  UA pending.  Urine and blood cultures pending.  Patient was given 1 L fluid bolus in the ED, repeat lactate pending.  Blood pressure stable.  Monitor WBC count.  AKI on CKD stage IIIa Likely prerenal azotemia from dehydration. BUN 31, creatinine 1.6 (baseline 1.2-1.4). -Continue gentle IV fluid hydration.  Monitor renal function and urine output.  Avoid nephrotoxic agents.  Hyperlipidemia -Continue Crestor  Progressive dementia, multiple system atrophy -Continue home Aricept and Mestinon  Addendum: Wife reports advanced dementia and is quite confused at baseline.  Head CT negative for acute intracranial abnormality.  No focal neurodeficit.  History of dysautonomia with postural hypotension High fall risk Chronic debility/wheelchair-bound -Avoid antihypertensive medications, fall precautions, PT/OT eval  Overactive bladder -Continue Myrbetriq  History of stroke -Continue aspirin and statin  History of vitamin D deficiency -Continue vitamin D supplement  DVT prophylaxis: SCDs Code Status: DNR-discussed with the patient's wife. Family Communication: Wife at bedside. Disposition Plan: Status is: Observation  The patient remains OBS appropriate and will d/c before 2 midnights.  Level of care:  Level of care: Telemetry Medical  The medical decision making on this patient was of high complexity and the patient is at high risk for clinical deterioration, therefore this is a level 3 visit.  Shela Leff MD [facility]  If 7PM-7AM, please contact night-coverage [email or web address]  08/19/2021, 1:01 AM

## 2021-08-19 NOTE — Care Management Obs Status (Signed)
[facility] NOTIFICATION   Patient Details  Name: Mathew Miller. MRN: [id number] Date of Birth: 09-09-1946   Medicare Observation Status Notification Given:  Yes    Cyndi Bender, RN 08/19/2021, 4:26 PM

## 2021-08-19 NOTE — Evaluation (Signed)
Occupational Therapy Evaluation Patient Details Name: Mathew Miller. MRN: [id number] DOB: [birth year] Today's Date: 08/19/2021   History of Present Illness 75 y.o. male with medical history significant of hypertension, hyperlipidemia, prediabetes, vitamin D deficiency, CKD stage IIIa, history of stroke, progressive dementia, multiple system atrophy, dysautonomia with postural hypotension, wheelchair dependent and unable to ambulate independently due to profound hypotension with standing, fall risk, sensorineural hearing loss, BPH, overactive bladder presented for evaluation of strong urine odor and fever.   Clinical Impression   Patient admitted for the diagnosis above.  Seen in conjunction with PT in the ED.  Patient with decreased level of alertness, and difficulty participating with ADL/mobility portion of the evaluation.  PTA, the patient appears to be needing increasing level of support from spouse, particularly once [facility] services ended.  Currently she baths/dressed in at bedlevel, and continues to transfer him to the bedside commode at [facility] level.  Patient with significant lethargy, and currently needing total assist of two for any mobility or ADL attempts.  At this point, [facility] rehab is recommended, but home with 24 hour assist and [facility] OT is a possibility if he progresses.       Recommendations for follow up therapy are one component of a multi-disciplinary discharge planning process, led by the attending physician.  Recommendations may be updated based on patient status, additional functional criteria and insurance authorization.   Follow Up Recommendations  Skilled nursing-short term rehab (<3 hours/day)    Assistance Recommended at Discharge Frequent or constant Supervision/Assistance  Functional Status Assessment  Patient has had a recent decline in their functional status and demonstrates the ability to make significant improvements in function in a reasonable and predictable amount  of time.  Equipment Recommendations  None recommended by OT    Recommendations for Other Services       Precautions / Restrictions Precautions Precautions: Fall Precaution Comments: Orthostatic Restrictions Weight Bearing Restrictions: No      Mobility Bed Mobility Overal bed mobility: Needs Assistance Bed Mobility: Supine to Sit;Sit to Supine     Supine to sit: Total assist;+2 for physical assistance Sit to supine: Total assist;+2 for physical assistance        Transfers                          Balance Overall balance assessment: Needs assistance Sitting-balance support: No upper extremity supported;Feet unsupported Sitting balance-Leahy Scale: Zero   Postural control: Posterior lean;Left lateral lean                                 ADL either performed or assessed with clinical judgement   ADL                                         General ADL Comments: Will need to be better assessed with improved LOA     Vision   Vision Assessment?: Vision impaired- to be further tested in functional context     Perception Perception Perception: Not tested   Praxis Praxis Praxis: Not tested    Pertinent Vitals/Pain Pain Assessment: Faces Pain Score: 0-No pain Pain Intervention(s): Monitored during session     Hand Dominance Right   Extremity/Trunk Assessment Upper Extremity Assessment Upper Extremity Assessment: Difficult to assess due to impaired cognition  Lower Extremity Assessment Lower Extremity Assessment: Defer to PT evaluation   Cervical / Trunk Assessment Cervical / Trunk Assessment: Kyphotic   Communication Communication Communication: HOH   Cognition Arousal/Alertness: Lethargic   Overall Cognitive Status: Difficult to assess                                 General Comments: Baseline cognitive dysfunction per spouse     General Comments       Exercises     Shoulder  Instructions      Home Living Family/patient expects to be discharged to:: Private residence Living Arrangements: Spouse/significant other Available Help at Discharge: Family;Available 24 hours/day Type of Home: House Home Access: Stairs to enter [employer] of Steps: 6   Home Layout: Multi-level Alternate Level Stairs-Number of Steps: Spouse keeps the patient upstairs unless he needs to go out for an appointment. Alternate Level Stairs-Rails: Right;Left;Can reach both Bathroom Shower/Tub: Occupational psychologist: Handicapped height Bathroom Accessibility: Yes How Accessible: Accessible via walker Home Equipment: Conservation officer, nature (2 wheels);Wheelchair - manual;Shower seat          Prior Functioning/Environment Prior Level of Function : Needs assist  Cognitive Assist : Mobility (cognitive);ADLs (cognitive) Mobility (Cognitive): Intermittent cues ADLs (Cognitive): Intermittent cues Physical Assist : Mobility (physical);ADLs (physical) Mobility (physical): Bed mobility;Transfers ADLs (physical): Bathing;Dressing;Toileting;IADLs Mobility Comments: Spouse states patient continues to perform SPT with assist and RW ADLs Comments: Spouse currently perfroming bad level ADL        OT Problem List: Decreased strength;Decreased range of motion;Decreased activity tolerance;Impaired balance (sitting and/or standing);Decreased safety awareness;Decreased cognition      OT Treatment/Interventions: Self-care/ADL training;Therapeutic exercise;Balance training;Therapeutic activities;DME and/or AE instruction    OT Goals(Current goals can be found in the care plan section) Acute Rehab OT Goals Patient Stated Goal: Spouse would perfer to take him home with [facility] OT Goal Formulation: With patient/family Time For Goal Achievement: 09/02/21 Potential to Achieve Goals: Fair  OT Frequency: Min 2X/week   Barriers to D/C:    none noted       Co-evaluation PT/OT/SLP  Co-Evaluation/Treatment: Yes Reason for Co-Treatment: Complexity of the patient's impairments (multi-system involvement);Necessary to address cognition/behavior during functional activity;For patient/therapist safety   OT goals addressed during session: ADL's and self-care      AM-PAC OT "6 Clicks" Daily Activity     Outcome Measure Help from another person eating meals?: Total Help from another person taking care of personal grooming?: Total Help from another person toileting, which includes using toliet, bedpan, or urinal?: Total Help from another person bathing (including washing, rinsing, drying)?: Total Help from another person to put on and taking off regular upper body clothing?: Total Help from another person to put on and taking off regular lower body clothing?: Total 6 Click Score: 6   End of Session    Activity Tolerance: Patient limited by lethargy Patient left: in bed;with family/visitor present  OT Visit Diagnosis: Unsteadiness on feet (R26.81);Muscle weakness (generalized) (M62.81);Adult, failure to thrive (R62.7)                Time: 7858-8502 OT Time Calculation (min): 16 min Charges:  OT General Charges $OT Visit: 1 Visit OT Evaluation $OT Eval Moderate Complexity: 1 Mod  08/19/2021  RP, OTR/L  Acute Rehabilitation Services  Office:  [phone]   Metta Clines 08/19/2021, 11:58 AM

## 2021-08-19 NOTE — ED Provider Notes (Addendum)
[facility] EMERGENCY DEPARTMENT Provider Note   CSN: [id number] Arrival date & time: 08/18/21  1110     History Chief Complaint  Patient presents with   Urinary Tract Infection    Mathew Matusek. is a 75 y.o. male.  The history is provided by the spouse. The history is limited by the condition of the patient.  Urinary Tract Infection Presenting symptoms: no penile discharge   Context: not after injury and not after intercourse   Relieved by:  Nothing Worsened by:  Nothing Ineffective treatments:  None tried Associated symptoms: fever, nausea and vomiting   Associated symptoms: no abdominal pain and no diarrhea   Risk factors: no bladder surgery and no change in medication       Past Medical History:  Diagnosis Date   Constipation    Hyperlipidemia    Hypertension    Prediabetes    no meds    Vitamin D deficiency     Patient Active Problem List   Diagnosis Date Noted   Orthostatic hypotension 12/30/2020   Prolonged QT interval 12/30/2020   Dementia without behavioral disturbance ([place])    Palliative care by specialist    CVA (cerebral vascular accident) ([place]) 07/15/2020   Hypokalemia    B12 deficiency 03/26/2020   Unstable gait 10/01/2019   Frequent falls 10/01/2019   Imbalance 02/27/2018   BMI 24.0-24.9, adult 02/26/2018   Acid reflux 02/26/2018   Memory changes 02/26/2018   Vitamin D deficiency 03/01/2014   Goals of care, counseling/discussion 03/01/2014   Hyperlipidemia    History of prediabetes    HTN (hypertension) 10/09/2013    Past Surgical History:  Procedure Laterality Date   CATARACT EXTRACTION, BILATERAL  2017   COLONOSCOPY     NO PAST SURGERIES         Family History  Problem Relation Age of Onset   Hyperlipidemia Mother    Glaucoma Mother    Heart disease Father    Hypertension Father    Colon cancer Neg Hx    Esophageal cancer Neg Hx    Pancreatic cancer Neg Hx    Prostate cancer Neg Hx    Rectal cancer  Neg Hx    Stomach cancer Neg Hx     Social History   Tobacco Use   Smoking status: Never   Smokeless tobacco: Never  Vaping Use   Vaping Use: Never used  Substance Use Topics   Alcohol use: Yes    Alcohol/week: 0.0 standard drinks    Comment: very Rare /Beer.wine.   Drug use: No    Home Medications Prior to Admission medications   Medication Sig Start Date End Date Taking? Authorizing Provider  aspirin EC 81 MG tablet Take 81 mg by mouth daily. Swallow whole.    [provider]  BELSOMRA 15 MG TABS  06/20/21   [provider]  cholecalciferol (VITAMIN D3) 25 MCG (1000 UNIT) tablet Take 5,000 Units by mouth daily.    [provider]  Cyanocobalamin (B-12 PO) Take by mouth daily.    [provider]  donepezil (ARICEPT) 10 MG tablet TAKE 1 TABLET AT BEDTIME 07/21/21   Pieter Partridge, DO  Magnesium 250 MG TABS Take 250 mg by mouth daily.    [provider]  mirabegron ER (MYRBETRIQ) 50 MG TB24 tablet Take 1 tablet Daily for OverActive Bladder 08/09/21   Unk Pinto, MD  polyethylene glycol (MIRALAX / GLYCOLAX) 17 g packet Take 17 g by mouth daily.  [provider]  potassium chloride SA (KLOR-CON) 20 MEQ tablet TAKE 1 TABLET TWICE DAILY  (FOR POTASSIUM REPLACEMENT) 01/14/21   Unk Pinto, MD  pyridostigmine (MESTINON) 60 MG tablet TAKE 1 TABLET IN THE MORNING AND AT BEDTIME 08/05/21   Sueanne Margarita, MD  rosuvastatin (CRESTOR) 5 MG tablet TAKE 1 TABLET EVERY DAY FOR CHOLESTEROL 12/22/20   Liane Comber, NP  senna-docusate (GNP STOOL SOFTENER/LAXATIVE) 8.6-50 MG tablet Take 1 tablet Daily for Constipation 09/25/20   Unk Pinto, MD    Allergies    Hydrocodone-acetaminophen, Sudafed [pseudoephedrine hcl], and Tetanus toxoids  Review of Systems   Review of Systems  Unable to perform ROS: Dementia  Constitutional:  Positive for fever.  HENT:  Negative for congestion.   Eyes:  Negative for redness.  Respiratory:   Negative for cough, wheezing and stridor.   Cardiovascular:  Negative for leg swelling.  Gastrointestinal:  Positive for nausea and vomiting. Negative for abdominal pain and diarrhea.  Genitourinary:  Negative for penile discharge.  Musculoskeletal:  Negative for arthralgias.  Skin:  Positive for rash.  Neurological:  Negative for facial asymmetry.  Psychiatric/Behavioral:  Positive for confusion. Negative for agitation.    Physical Exam Updated Vital Signs BP (!) 148/82 (BP Location: Left Arm)   Pulse 67   Temp 98.1 F (36.7 C) (Oral)   Resp 16   SpO2 100%   Physical Exam Vitals and nursing note reviewed.  Constitutional:      General: He is not in acute distress. HENT:     Head: Normocephalic and atraumatic.     Nose: Nose normal.  Eyes:     Conjunctiva/sclera: Conjunctivae normal.     Pupils: Pupils are equal, round, and reactive to light.  Cardiovascular:     Rate and Rhythm: Normal rate and regular rhythm.     Pulses: Normal pulses.     Heart sounds: Normal heart sounds.  Pulmonary:     Effort: Pulmonary effort is normal.     Breath sounds: Normal breath sounds.  Abdominal:     General: Bowel sounds are normal.     Palpations: Abdomen is soft.     Tenderness: There is no abdominal tenderness. There is no guarding.  Musculoskeletal:        General: Normal range of motion.     Cervical back: Normal range of motion and neck supple.     Right lower leg: No edema.     Left lower leg: No edema.  Skin:    General: Skin is warm and dry.     Capillary Refill: Capillary refill takes less than 2 seconds.     Comments: Maculopapular lesions on the back   Neurological:     Deep Tendon Reflexes: Reflexes normal.    ED Results / Procedures / Treatments   Labs (all labs ordered are listed, but only abnormal results are displayed) Results for orders placed or performed during the hospital encounter of 08/18/21  Lactic acid, plasma  Result Value Ref Range   Lactic Acid,  Venous 2.0 (HH) 0.5 - 1.9 mmol/L  Comprehensive metabolic panel  Result Value Ref Range   Sodium 139 135 - 145 mmol/L   Potassium 4.5 3.5 - 5.1 mmol/L   Chloride 107 98 - 111 mmol/L   CO2 21 (L) 22 - 32 mmol/L   Glucose, Bld 153 (H) 70 - 99 mg/dL   BUN 31 (H) 8 - 23 mg/dL   Creatinine, Ser 1.67 (H) 0.61 - 1.24 mg/dL  Calcium 9.2 8.9 - 10.3 mg/dL   Total Protein 5.9 (L) 6.5 - 8.1 g/dL   Albumin 3.5 3.5 - 5.0 g/dL   AST 22 15 - 41 U/L   ALT 12 0 - 44 U/L   Alkaline Phosphatase 82 38 - 126 U/L   Total Bilirubin 1.4 (H) 0.3 - 1.2 mg/dL   GFR, Estimated 42 (L) >60 mL/min   Anion gap 11 5 - 15  CBC with Differential  Result Value Ref Range   WBC 18.3 (H) 4.0 - 10.5 K/uL   RBC 5.00 4.22 - 5.81 MIL/uL   Hemoglobin 15.4 13.0 - 17.0 g/dL   HCT 46.1 39.0 - 52.0 %   MCV 92.2 80.0 - 100.0 fL   MCH 30.8 26.0 - 34.0 pg   MCHC 33.4 30.0 - 36.0 g/dL   RDW 14.0 11.5 - 15.5 %   Platelets 186 150 - 400 K/uL   nRBC 0.0 0.0 - 0.2 %   Neutrophils Relative % 90 %   Neutro Abs 16.6 (H) 1.7 - 7.7 K/uL   Lymphocytes Relative 4 %   Lymphs Abs 0.7 0.7 - 4.0 K/uL   Monocytes Relative 5 %   Monocytes Absolute 0.9 0.1 - 1.0 K/uL   Eosinophils Relative 0 %   Eosinophils Absolute 0.0 0.0 - 0.5 K/uL   Basophils Relative 0 %   Basophils Absolute 0.0 0.0 - 0.1 K/uL   Immature Granulocytes 1 %   Abs Immature Granulocytes 0.10 (H) 0.00 - 0.07 K/uL   DG Chest 2 View  Result Date: 08/18/2021 CLINICAL DATA:  75 year old male with weakness, fever. EXAM: CHEST - 2 VIEW COMPARISON:  Chest radiographs 07/26/2020 and earlier. FINDINGS: Upright AP and lateral views of the chest. Lung volumes are within normal limits. Mediastinal contours are stable, mildly tortuous thoracic aorta. Visualized tracheal air column is within normal limits. Both lungs appear clear. No pneumothorax or pleural effusion. Chronic left lateral 5th rib fracture appears stable. No acute osseous abnormality identified. Gas and retained stool in  visible bowel in the upper abdomen. IMPRESSION: No acute cardiopulmonary abnormality. Electronically Signed   By: Genevie Ann M.D.   On: 08/18/2021 11:52     Radiology DG Chest 2 View  Result Date: 08/18/2021 CLINICAL DATA:  75 year old male with weakness, fever. EXAM: CHEST - 2 VIEW COMPARISON:  Chest radiographs 07/26/2020 and earlier. FINDINGS: Upright AP and lateral views of the chest. Lung volumes are within normal limits. Mediastinal contours are stable, mildly tortuous thoracic aorta. Visualized tracheal air column is within normal limits. Both lungs appear clear. No pneumothorax or pleural effusion. Chronic left lateral 5th rib fracture appears stable. No acute osseous abnormality identified. Gas and retained stool in visible bowel in the upper abdomen. IMPRESSION: No acute cardiopulmonary abnormality. Electronically Signed   By: Genevie Ann M.D.   On: 08/18/2021 11:52    Procedures Procedures   Medications Ordered in ED Medications  cefTRIAXone (ROCEPHIN) 2 g in sodium chloride 0.9 % 100 mL IVPB (has no administration in time range)  lactated ringers bolus 1,000 mL (has no administration in time range)    ED Course  I have reviewed the triage vital signs and the nursing notes.  Pertinent labs & imaging results that were available during my care of the patient were reviewed by me and considered in my medical decision making (see chart for details).     Final Clinical Impression(s) / ED Diagnoses Final diagnoses:  Lower urinary tract infectious disease  Altered mental  status, unspecified altered mental status type   Admit to sirs UTI and AMS Rx / DC Orders ED Discharge Orders     None        Enzley Kitchens, MD 08/19/21 9795    Randal Buba, Chemika Nightengale, MD 08/19/21 [id number]

## 2021-08-20 ENCOUNTER — Observation Stay (HOSPITAL_COMMUNITY): Payer: Medicare HMO

## 2021-08-20 ENCOUNTER — Ambulatory Visit: Payer: Medicare HMO | Admitting: Cardiology

## 2021-08-20 DIAGNOSIS — Z66 Do not resuscitate: Secondary | ICD-10-CM | POA: Diagnosis not present

## 2021-08-20 DIAGNOSIS — R5381 Other malaise: Secondary | ICD-10-CM | POA: Diagnosis not present

## 2021-08-20 DIAGNOSIS — Z20822 Contact with and (suspected) exposure to covid-19: Secondary | ICD-10-CM | POA: Diagnosis not present

## 2021-08-20 DIAGNOSIS — B957 Other staphylococcus as the cause of diseases classified elsewhere: Secondary | ICD-10-CM | POA: Diagnosis not present

## 2021-08-20 DIAGNOSIS — R0689 Other abnormalities of breathing: Secondary | ICD-10-CM | POA: Diagnosis not present

## 2021-08-20 DIAGNOSIS — R000 Tachycardia, unspecified: Secondary | ICD-10-CM | POA: Diagnosis not present

## 2021-08-20 DIAGNOSIS — I69354 Hemiplegia and hemiparesis following cerebral infarction affecting left non-dominant side: Secondary | ICD-10-CM | POA: Diagnosis not present

## 2021-08-20 DIAGNOSIS — M2550 Pain in unspecified joint: Secondary | ICD-10-CM | POA: Diagnosis not present

## 2021-08-20 DIAGNOSIS — F0390 Unspecified dementia without behavioral disturbance: Secondary | ICD-10-CM | POA: Diagnosis not present

## 2021-08-20 DIAGNOSIS — I700 Atherosclerosis of aorta: Secondary | ICD-10-CM | POA: Diagnosis not present

## 2021-08-20 DIAGNOSIS — I639 Cerebral infarction, unspecified: Secondary | ICD-10-CM | POA: Diagnosis not present

## 2021-08-20 DIAGNOSIS — E860 Dehydration: Secondary | ICD-10-CM | POA: Diagnosis present

## 2021-08-20 DIAGNOSIS — E785 Hyperlipidemia, unspecified: Secondary | ICD-10-CM | POA: Diagnosis not present

## 2021-08-20 DIAGNOSIS — R7303 Prediabetes: Secondary | ICD-10-CM | POA: Diagnosis present

## 2021-08-20 DIAGNOSIS — R339 Retention of urine, unspecified: Secondary | ICD-10-CM | POA: Diagnosis not present

## 2021-08-20 DIAGNOSIS — E559 Vitamin D deficiency, unspecified: Secondary | ICD-10-CM | POA: Diagnosis present

## 2021-08-20 DIAGNOSIS — R2681 Unsteadiness on feet: Secondary | ICD-10-CM | POA: Diagnosis not present

## 2021-08-20 DIAGNOSIS — H1010 Acute atopic conjunctivitis, unspecified eye: Secondary | ICD-10-CM | POA: Diagnosis not present

## 2021-08-20 DIAGNOSIS — I4581 Long QT syndrome: Secondary | ICD-10-CM | POA: Diagnosis not present

## 2021-08-20 DIAGNOSIS — R296 Repeated falls: Secondary | ICD-10-CM | POA: Diagnosis present

## 2021-08-20 DIAGNOSIS — G9341 Metabolic encephalopathy: Secondary | ICD-10-CM | POA: Diagnosis not present

## 2021-08-20 DIAGNOSIS — K219 Gastro-esophageal reflux disease without esophagitis: Secondary | ICD-10-CM | POA: Diagnosis present

## 2021-08-20 DIAGNOSIS — R2689 Other abnormalities of gait and mobility: Secondary | ICD-10-CM | POA: Diagnosis not present

## 2021-08-20 DIAGNOSIS — R404 Transient alteration of awareness: Secondary | ICD-10-CM | POA: Diagnosis not present

## 2021-08-20 DIAGNOSIS — Z515 Encounter for palliative care: Secondary | ICD-10-CM | POA: Diagnosis not present

## 2021-08-20 DIAGNOSIS — R338 Other retention of urine: Secondary | ICD-10-CM | POA: Diagnosis not present

## 2021-08-20 DIAGNOSIS — Z8673 Personal history of transient ischemic attack (TIA), and cerebral infarction without residual deficits: Secondary | ICD-10-CM | POA: Diagnosis not present

## 2021-08-20 DIAGNOSIS — Z23 Encounter for immunization: Secondary | ICD-10-CM | POA: Diagnosis not present

## 2021-08-20 DIAGNOSIS — N1831 Chronic kidney disease, stage 3a: Secondary | ICD-10-CM | POA: Diagnosis not present

## 2021-08-20 DIAGNOSIS — A419 Sepsis, unspecified organism: Secondary | ICD-10-CM | POA: Diagnosis not present

## 2021-08-20 DIAGNOSIS — F03C0 Unspecified dementia, severe, without behavioral disturbance, psychotic disturbance, mood disturbance, and anxiety: Secondary | ICD-10-CM | POA: Diagnosis not present

## 2021-08-20 DIAGNOSIS — Z7401 Bed confinement status: Secondary | ICD-10-CM | POA: Diagnosis not present

## 2021-08-20 DIAGNOSIS — R6510 Systemic inflammatory response syndrome (SIRS) of non-infectious origin without acute organ dysfunction: Secondary | ICD-10-CM | POA: Diagnosis not present

## 2021-08-20 DIAGNOSIS — N179 Acute kidney failure, unspecified: Secondary | ICD-10-CM

## 2021-08-20 DIAGNOSIS — E782 Mixed hyperlipidemia: Secondary | ICD-10-CM | POA: Diagnosis not present

## 2021-08-20 DIAGNOSIS — J309 Allergic rhinitis, unspecified: Secondary | ICD-10-CM | POA: Diagnosis not present

## 2021-08-20 DIAGNOSIS — I10 Essential (primary) hypertension: Secondary | ICD-10-CM | POA: Diagnosis not present

## 2021-08-20 DIAGNOSIS — R7881 Bacteremia: Secondary | ICD-10-CM | POA: Diagnosis not present

## 2021-08-20 DIAGNOSIS — F0280 Dementia in other diseases classified elsewhere without behavioral disturbance: Secondary | ICD-10-CM | POA: Diagnosis not present

## 2021-08-20 DIAGNOSIS — R402431 Glasgow coma scale score 3-8, in the field [EMT or ambulance]: Secondary | ICD-10-CM | POA: Diagnosis not present

## 2021-08-20 DIAGNOSIS — I951 Orthostatic hypotension: Secondary | ICD-10-CM | POA: Diagnosis present

## 2021-08-20 DIAGNOSIS — Z7189 Other specified counseling: Secondary | ICD-10-CM | POA: Diagnosis not present

## 2021-08-20 DIAGNOSIS — I129 Hypertensive chronic kidney disease with stage 1 through stage 4 chronic kidney disease, or unspecified chronic kidney disease: Secondary | ICD-10-CM | POA: Diagnosis present

## 2021-08-20 DIAGNOSIS — G20 Parkinson's disease: Secondary | ICD-10-CM | POA: Diagnosis not present

## 2021-08-20 DIAGNOSIS — Z0000 Encounter for general adult medical examination without abnormal findings: Secondary | ICD-10-CM | POA: Diagnosis not present

## 2021-08-20 DIAGNOSIS — K5900 Constipation, unspecified: Secondary | ICD-10-CM | POA: Diagnosis not present

## 2021-08-20 DIAGNOSIS — R918 Other nonspecific abnormal finding of lung field: Secondary | ICD-10-CM | POA: Diagnosis not present

## 2021-08-20 DIAGNOSIS — H109 Unspecified conjunctivitis: Secondary | ICD-10-CM | POA: Diagnosis present

## 2021-08-20 DIAGNOSIS — R278 Other lack of coordination: Secondary | ICD-10-CM | POA: Diagnosis not present

## 2021-08-20 DIAGNOSIS — Z0001 Encounter for general adult medical examination with abnormal findings: Secondary | ICD-10-CM | POA: Diagnosis not present

## 2021-08-20 DIAGNOSIS — R509 Fever, unspecified: Secondary | ICD-10-CM | POA: Diagnosis not present

## 2021-08-20 DIAGNOSIS — R4182 Altered mental status, unspecified: Secondary | ICD-10-CM | POA: Diagnosis not present

## 2021-08-20 DIAGNOSIS — N390 Urinary tract infection, site not specified: Secondary | ICD-10-CM | POA: Diagnosis not present

## 2021-08-20 DIAGNOSIS — E876 Hypokalemia: Secondary | ICD-10-CM | POA: Diagnosis not present

## 2021-08-20 DIAGNOSIS — R9431 Abnormal electrocardiogram [ECG] [EKG]: Secondary | ICD-10-CM | POA: Diagnosis not present

## 2021-08-20 DIAGNOSIS — M545 Low back pain, unspecified: Secondary | ICD-10-CM | POA: Diagnosis not present

## 2021-08-20 DIAGNOSIS — R1312 Dysphagia, oropharyngeal phase: Secondary | ICD-10-CM | POA: Diagnosis not present

## 2021-08-20 DIAGNOSIS — E8809 Other disorders of plasma-protein metabolism, not elsewhere classified: Secondary | ICD-10-CM | POA: Diagnosis present

## 2021-08-20 DIAGNOSIS — M6281 Muscle weakness (generalized): Secondary | ICD-10-CM | POA: Diagnosis not present

## 2021-08-20 DIAGNOSIS — E440 Moderate protein-calorie malnutrition: Secondary | ICD-10-CM | POA: Diagnosis not present

## 2021-08-20 LAB — URINE CULTURE: Special Requests: NORMAL

## 2021-08-20 MED ORDER — TAMSULOSIN HCL 0.4 MG PO CAPS: 0.4000 mg | ORAL_CAPSULE | Freq: Every day | ORAL | 0 refills | Status: AC

## 2021-08-20 MED ORDER — OYSTER SHELL CALCIUM/D3 500-5 MG-MCG PO TABS: 2.0000 | ORAL_TABLET | Freq: Three times a day (TID) | ORAL | 1 refills | Status: AC

## 2021-08-20 MED ORDER — CHLORHEXIDINE GLUCONATE CLOTH 2 % EX PADS
6.0000 | MEDICATED_PAD | Freq: Every day | CUTANEOUS | Status: DC
  Administered 2021-08-20: 6 via TOPICAL

## 2021-08-20 NOTE — Plan of Care (Signed)

## 2021-08-20 NOTE — Discharge Summary (Addendum)
Physician Discharge Summary  Mathew Miller. BZJ:[id number] DOB: [birth year] DOA: 08/18/2021  PCP: Unk Pinto, MD  Admit date: 08/18/2021 Discharge date: 08/20/2021  Admitted From:Home Disposition:  Home   Recommendations for Outpatient Follow-up:  Follow up with PCP in 1-2 weeks Please obtain BMP/CBC in one week Patient to follow with Dr. Diona Fanti in 1 week as an outpatient regarding voiding trial   Home Health:YES>> PT/OT/RN/Aid/SW  Discharge Condition:Stable CODE STATUS: DNR Diet recommendation: Heart Healthy / Carb Modified   Brief/Interim Summary:    Discharge Diagnoses:  Principal Problem:   Sepsis ([place]) Active Problems:   Hyperlipidemia   History of prediabetes   Dementia ([place])   Acute-on-chronic kidney injury ([place])    SIRS/SEPSIS RULED OUT -Patient presents from home with altered mental status, her work-up was significant for leukocytosis, and mildly elevated lactic acid, she was started empirically on antibiotics, but her work-up is negative, negative UA, chest x-ray not suggestive of infection, blood culture remains negative, no evidence of infection, meningeal signs are negative as well, as discussed with wife, patient appears to be back at his baseline today, so he will be discharged home with no further needs of antibiotics..    AKI on CKD stage IIIa Likely prerenal azotemia from dehydration. BUN 31, creatinine 1.6 (baseline 1.2-1.4). -back to baseline   Hyperlipidemia -Continue Crestor   Progressive dementia, multiple system atrophy -Continue home Aricept and Mestinon - Wife reports advanced dementia  at baseline.  Head CT negative for acute intracranial abnormality.  No focal neurodeficit.   History of dysautonomia with postural hypotension High fall risk Chronic debility/wheelchair-bound -Avoid antihypertensive medications, fall precautions, PT/OT eval   Overactive bladder/ urinary retention -Continue Myrbetriq -Patient required Foley  catheter insertion in ED, will start on Flomax, to follow with his primary urologist in 1 week as an outpatient regarding voiding trial, discussed with wife at bedside.   History of stroke -Continue aspirin and statin   History of vitamin D deficiency, is complaining of lower back pain, chest x-ray was obtained showing stable chronic compression fracture, patient is already on vitamin D supplements.  We will add some extra supplement for next month where he is to continue his baseline supplements after that.  Discharge Instructions  Discharge Instructions     Diet - low sodium heart healthy   Complete by: As directed    Increase activity slowly   Complete by: As directed       Allergies as of 08/20/2021       Reactions   Hydrocodone-acetaminophen Nausea Only   Sudafed [pseudoephedrine Hcl]    Urinary retention   Tetanus Toxoids    Itching, burning feeling, ran a fever        Medication List     TAKE these medications    aspirin EC 81 MG tablet Take 81 mg by mouth daily. Swallow whole.   B-12 PO Take 1,000 mcg by mouth daily.   Belsomra 15 MG Tabs Generic drug: Suvorexant Take 15 mg by mouth daily.   calcium-vitamin D 500MG -200UNIT (5MCG) tablet Commonly known as: OSCAL WITH D Take 2 tablets by mouth 3 (three) times daily.   donepezil 10 MG tablet Commonly known as: ARICEPT TAKE 1 TABLET AT BEDTIME   DRY EYES OP Apply 1 drop to eye 2 (two) times daily as needed (dry eyes).   Magnesium 250 MG Tabs Take 250 mg by mouth daily.   mirabegron ER 50 MG Tb24 tablet Commonly known as: MYRBETRIQ Take 1 tablet Daily for  OverActive Bladder What changed:  how much to take how to take this when to take this additional instructions   polyethylene glycol 17 g packet Commonly known as: MIRALAX / GLYCOLAX Take 17 g by mouth daily as needed for mild constipation.   potassium chloride SA 20 MEQ tablet Commonly known as: KLOR-CON TAKE 1 TABLET TWICE DAILY  (FOR  POTASSIUM REPLACEMENT) What changed:  how much to take how to take this when to take this additional instructions   pyridostigmine 60 MG tablet Commonly known as: MESTINON TAKE 1 TABLET IN THE MORNING AND AT BEDTIME What changed: See the new instructions.   rosuvastatin 5 MG tablet Commonly known as: CRESTOR TAKE 1 TABLET EVERY DAY FOR CHOLESTEROL What changed:  how much to take how to take this when to take this additional instructions   senna-docusate 8.6-50 MG tablet Commonly known as: GNP Stool Softener/Laxative Take 1 tablet Daily for Constipation What changed:  how much to take how to take this when to take this additional instructions   Vitamin D 125 MCG (5000 UT) Caps Take 5,000 Units by mouth daily.        Follow-up Information     Unk Pinto, MD .   Specialty: Internal Medicine Contact information: [address] [place] [address] [phone]         [facility] [place] Follow up.   Specialty: Home Health Services Why: [place] for PT OT aide nad social work Contact information: [address] [place] [address] [phone]                Allergies  Allergen Reactions   Hydrocodone-Acetaminophen Nausea Only   Sudafed [Pseudoephedrine Hcl]     Urinary retention   Tetanus Toxoids     Itching, burning feeling, ran a fever    Consultations: None   Procedures/Studies: DG Chest 2 View  Result Date: 08/18/2021 CLINICAL DATA:  75 year old male with weakness, fever. EXAM: CHEST - 2 VIEW COMPARISON:  Chest radiographs 07/26/2020 and earlier. FINDINGS: Upright AP and lateral views of the chest. Lung volumes are within normal limits. Mediastinal contours are stable, mildly tortuous thoracic aorta. Visualized tracheal air column is within normal limits. Both lungs appear clear. No pneumothorax or pleural effusion. Chronic left lateral 5th rib fracture appears stable. No acute osseous abnormality  identified. Gas and retained stool in visible bowel in the upper abdomen. IMPRESSION: No acute cardiopulmonary abnormality. Electronically Signed   By: Genevie Ann M.D.   On: 08/18/2021 11:52   DG Lumbar Spine 2-3 Views  Result Date: 08/20/2021 CLINICAL DATA:  Back pain EXAM: LUMBAR SPINE - 2-3 VIEW COMPARISON:  CT from previous day FINDINGS: Mild compression deformity of L2 with less than 20% loss of height anteriorly, no significant osseous retropulsion. Moderate compression deformity of L3 with anterior fragmentation, estimated 50% loss of height anteriorly, no significant osseous retropulsion. There is early kyphotic deformity in the upper lumbar spine. No spondylolisthesis. Patchy aortic calcifications without suggestion of aneurysm. IMPRESSION: 1. Stable L2 and L3 compression fracture deformities, indeterminate chronicity. MR lumbar spine or bone scintigraphy may be useful to differentiate subacute/unhealed from chronic deformities, if intervention is contemplated. 2.  Aortic Atherosclerosis (ICD10-170.0). Electronically Signed   By: Lucrezia Europe M.D.   On: 08/20/2021 11:36   CT HEAD WO CONTRAST (5MM)  Result Date: 08/19/2021 CLINICAL DATA:  Facial trauma EXAM: CT HEAD WITHOUT CONTRAST TECHNIQUE: Contiguous axial images were obtained from the base of the skull through  the vertex without intravenous contrast. COMPARISON:  12/01/2020 FINDINGS: Brain: No evidence of acute infarction, hemorrhage, hydrocephalus, extra-axial collection or mass lesion/mass effect. Global cortical atrophy. Subcortical white matter and periventricular small vessel ischemic changes. Vascular: No hyperdense vessel or unexpected calcification. Skull: Normal. Negative for fracture or focal lesion. Sinuses/Orbits: The visualized paranasal sinuses are essentially clear. The mastoid air cells are unopacified. Other: None. IMPRESSION: No evidence of acute intracranial abnormality. Atrophy with small vessel ischemic changes. Electronically  Signed   By: Julian Hy M.D.   On: 08/19/2021 01:33   CT Renal Stone Study  Result Date: 08/19/2021 CLINICAL DATA:  Flank pain EXAM: CT ABDOMEN AND PELVIS WITHOUT CONTRAST TECHNIQUE: Multidetector CT imaging of the abdomen and pelvis was performed following the standard protocol without IV contrast. COMPARISON:  08/13/2019 FINDINGS: LOWER CHEST: Normal. HEPATOBILIARY: Normal hepatic contours. No intra- or extrahepatic biliary dilatation. The gallbladder is normal. PANCREAS: Normal pancreas. No ductal dilatation or peripancreatic fluid collection. SPLEEN: Normal. ADRENALS/URINARY TRACT: The adrenal glands are normal. No hydronephrosis, nephroureterolithiasis or solid renal mass. Decompressed by Foley catheter. Thick-walled appearance may be due to under distension. STOMACH/BOWEL: There is no hiatal hernia. Normal duodenal course and caliber. No small bowel dilatation or inflammation. Massive stool ball in the rectum. The appendix is not visualized. No right lower quadrant inflammation or free fluid. VASCULAR/LYMPHATIC: There is calcific atherosclerosis of the abdominal aorta. No lymphadenopathy. REPRODUCTIVE: Multiple vascular clips at the prostate. MUSCULOSKELETAL. Age indeterminate but likely chronic wedge compression fractures of L2 and L3. OTHER: None. IMPRESSION: 1. No obstructive uropathy or nephroureterolithiasis. 2. Massive stool ball in the rectum. 3. Age indeterminate but likely nonacute wedge compression fractures of L2 and L3. However, these are new since 08/13/2019. Aortic Atherosclerosis (ICD10-I70.0). Electronically Signed   By: Ulyses Jarred M.D.   On: 08/19/2021 04:06      Subjective: No significant events overnight as discussed with staff, wife at bedside, reports mentation back to baseline, patient himself unable to provide any complaints.  Discharge Exam: Vitals:   08/20/21 0730 08/20/21 1143  BP: (!) 164/84 123/81  Pulse: (!) 56 60  Resp: 16 14  Temp: 98.4 F (36.9 C) 98  F (36.7 C)  SpO2: 98%    Vitals:   08/19/21 2335 08/20/21 0331 08/20/21 0730 08/20/21 1143  BP: (!) 169/90 (!) 158/78 (!) 164/84 123/81  Pulse: 76 65 (!) 56 60  Resp: 18 14 16 14   Temp:  98 F (36.7 C) 98.4 F (36.9 C) 98 F (36.7 C)  TempSrc:  Axillary Axillary Axillary  SpO2: 99% 99% 98%     General: Pt is awake, with advanced dementia, able to follow some commands  Cardiovascular: RRR, S1/S2 +, no rubs, no gallops Respiratory: CTA bilaterally, no wheezing, no rhonchi Abdominal: Soft, NT, ND, bowel sounds + Extremities: no edema, no cyanosis    The results of significant diagnostics from this hospitalization (including imaging, microbiology, ancillary and laboratory) are listed below for reference.     Microbiology: Recent Results (from the past 240 hour(s))  Urine Culture     Status: Abnormal   Collection Time: 08/18/21 11:54 PM   Specimen: In/Out Cath Urine  Result Value Ref Range Status   Specimen Description IN/OUT CATH URINE  Final   Special Requests   Final    Normal Performed at [facility], [address] [place], [place] [address]    Culture MULTIPLE SPECIES PRESENT, SUGGEST RECOLLECTION (A)  Final   Report Status 08/20/2021 FINAL  Final  Blood culture (routine single)     Status: None (Preliminary result)   Collection Time: 08/19/21 12:20 AM   Specimen: BLOOD  Result Value Ref Range Status   Specimen Description BLOOD RIGHT ANTECUBITAL  Final   Special Requests   Final    BOTTLES DRAWN AEROBIC AND ANAEROBIC Blood Culture results may not be optimal due to an inadequate volume of blood received in culture bottles   Culture   Final    NO GROWTH 1 DAY Performed at [facility], [place] [address] [place], [place] [address]    Report Status PENDING  Incomplete  Resp Panel by RT-PCR (Flu A&B, Covid) Nasopharyngeal Swab     Status: None   Collection Time: 08/19/21  5:03 AM   Specimen: Nasopharyngeal Swab; Nasopharyngeal(NP) swabs in vial  transport medium  Result Value Ref Range Status   SARS Coronavirus 2 by RT PCR NEGATIVE NEGATIVE Final    Comment: (NOTE) SARS-CoV-2 target nucleic acids are NOT DETECTED.  The SARS-CoV-2 RNA is generally detectable in upper respiratory specimens during the acute phase of infection. The lowest concentration of SARS-CoV-2 viral copies this assay can detect is 138 copies/mL. A negative result does not preclude SARS-Cov-2 infection and should not be used as the sole basis for treatment or other patient management decisions. A negative result may occur with  improper specimen collection/handling, submission of specimen other than nasopharyngeal swab, presence of viral mutation(s) within the areas targeted by this assay, and inadequate number of viral copies(<138 copies/mL). A negative result must be combined with clinical observations, patient history, and epidemiological information. The expected result is Negative.  Fact Sheet for Patients:  EntrepreneurPulse.com.au  Fact Sheet for Healthcare Providers:  IncredibleEmployment.be  This test is no t yet approved or cleared by the [place] FDA and  has been authorized for detection and/or diagnosis of SARS-CoV-2 by FDA under an Emergency Use Authorization (EUA). This EUA will remain  in effect (meaning this test can be used) for the duration of the COVID-19 declaration under Section 564(b)(1) of the Act, 21 U.S.C.section 360bbb-3(b)(1), unless the authorization is terminated  or revoked sooner.       Influenza A by PCR NEGATIVE NEGATIVE Final   Influenza B by PCR NEGATIVE NEGATIVE Final    Comment: (NOTE) The Xpert Xpress SARS-CoV-2/FLU/RSV plus assay is intended as an aid in the diagnosis of influenza from Nasopharyngeal swab specimens and should not be used as a sole basis for treatment. Nasal washings and aspirates are unacceptable for Xpert Xpress SARS-CoV-2/FLU/RSV testing.  Fact  Sheet for Patients: EntrepreneurPulse.com.au  Fact Sheet for Healthcare Providers: IncredibleEmployment.be  This test is not yet approved or cleared by the [place] FDA and has been authorized for detection and/or diagnosis of SARS-CoV-2 by FDA under an Emergency Use Authorization (EUA). This EUA will remain in effect (meaning this test can be used) for the duration of the COVID-19 declaration under Section 564(b)(1) of the Act, 21 U.S.C. section 360bbb-3(b)(1), unless the authorization is terminated or revoked.  Performed at [facility], [place] [address], [place], [place] [address]      Labs: BNP (last 3 results) No results for input(s): BNP in the last 8760 hours. Basic Metabolic Panel: Recent Labs  Lab 08/18/21 1107 08/19/21 0300  NA 139 141  K 4.5 4.2  CL 107 107  CO2 21* 23  GLUCOSE 153* 136*  BUN 31* 31*  CREATININE 1.67* 1.47*  CALCIUM 9.2 9.4   Liver Function Tests: Recent  Labs  Lab 08/18/21 1107  AST 22  ALT 12  ALKPHOS 82  BILITOT 1.4*  PROT 5.9*  ALBUMIN 3.5   No results for input(s): LIPASE, AMYLASE in the last 168 hours. No results for input(s): AMMONIA in the last 168 hours. CBC: Recent Labs  Lab 08/18/21 1107 08/19/21 0300  WBC 18.3* 13.9*  NEUTROABS 16.6*  --   HGB 15.4 15.2  HCT 46.1 48.0  MCV 92.2 94.3  PLT 186 179   Cardiac Enzymes: No results for input(s): CKTOTAL, CKMB, CKMBINDEX, TROPONINI in the last 168 hours. BNP: Invalid input(s): POCBNP CBG: No results for input(s): GLUCAP in the last 168 hours. D-Dimer No results for input(s): DDIMER in the last 72 hours. Hgb A1c No results for input(s): HGBA1C in the last 72 hours. Lipid Profile No results for input(s): CHOL, HDL, LDLCALC, TRIG, CHOLHDL, LDLDIRECT in the last 72 hours. Thyroid function studies No results for input(s): TSH, T4TOTAL, T3FREE, THYROIDAB in the last 72 hours.  Invalid input(s): FREET3 Anemia work up No  results for input(s): VITAMINB12, FOLATE, FERRITIN, TIBC, IRON, RETICCTPCT in the last 72 hours. Urinalysis    Component Value Date/Time   COLORURINE YELLOW 08/19/2021 [place] 08/19/2021 0238   LABSPEC 1.026 08/19/2021 0238   PHURINE 5.0 08/19/2021 0238   GLUCOSEU NEGATIVE 08/19/2021 0238   HGBUR NEGATIVE 08/19/2021 0238   BILIRUBINUR NEGATIVE 08/19/2021 0238   KETONESUR 5 (A) 08/19/2021 0238   PROTEINUR NEGATIVE 08/19/2021 0238   UROBILINOGEN 1 06/12/2014 1156   NITRITE NEGATIVE 08/19/2021 0238   LEUKOCYTESUR NEGATIVE 08/19/2021 0238   Sepsis Labs Invalid input(s): PROCALCITONIN,  WBC,  LACTICIDVEN Microbiology Recent Results (from the past 240 hour(s))  Urine Culture     Status: Abnormal   Collection Time: 08/18/21 11:54 PM   Specimen: In/Out Cath Urine  Result Value Ref Range Status   Specimen Description IN/OUT CATH URINE  Final   Special Requests   Final    Normal Performed at [facility], [place] [address] [place], [place] [address]    Culture MULTIPLE SPECIES PRESENT, SUGGEST RECOLLECTION (A)  Final   Report Status 08/20/2021 FINAL  Final  Blood culture (routine single)     Status: None (Preliminary result)   Collection Time: 08/19/21 12:20 AM   Specimen: BLOOD  Result Value Ref Range Status   Specimen Description BLOOD RIGHT ANTECUBITAL  Final   Special Requests   Final    BOTTLES DRAWN AEROBIC AND ANAEROBIC Blood Culture results may not be optimal due to an inadequate volume of blood received in culture bottles   Culture   Final    NO GROWTH 1 DAY Performed at [facility], [place] [address] [place], [place] [address]    Report Status PENDING  Incomplete  Resp Panel by RT-PCR (Flu A&B, Covid) Nasopharyngeal Swab     Status: None   Collection Time: 08/19/21  5:03 AM   Specimen: Nasopharyngeal Swab; Nasopharyngeal(NP) swabs in vial transport medium  Result Value Ref Range Status   SARS Coronavirus 2 by RT PCR NEGATIVE NEGATIVE Final     Comment: (NOTE) SARS-CoV-2 target nucleic acids are NOT DETECTED.  The SARS-CoV-2 RNA is generally detectable in upper respiratory specimens during the acute phase of infection. The lowest concentration of SARS-CoV-2 viral copies this assay can detect is 138 copies/mL. A negative result does not preclude SARS-Cov-2 infection and should not be used as the sole basis for treatment or other patient management decisions. A negative  result may occur with  improper specimen collection/handling, submission of specimen other than nasopharyngeal swab, presence of viral mutation(s) within the areas targeted by this assay, and inadequate number of viral copies(<138 copies/mL). A negative result must be combined with clinical observations, patient history, and epidemiological information. The expected result is Negative.  Fact Sheet for Patients:  EntrepreneurPulse.com.au  Fact Sheet for Healthcare Providers:  IncredibleEmployment.be  This test is no t yet approved or cleared by the [place] FDA and  has been authorized for detection and/or diagnosis of SARS-CoV-2 by FDA under an Emergency Use Authorization (EUA). This EUA will remain  in effect (meaning this test can be used) for the duration of the COVID-19 declaration under Section 564(b)(1) of the Act, 21 U.S.C.section 360bbb-3(b)(1), unless the authorization is terminated  or revoked sooner.       Influenza A by PCR NEGATIVE NEGATIVE Final   Influenza B by PCR NEGATIVE NEGATIVE Final    Comment: (NOTE) The Xpert Xpress SARS-CoV-2/FLU/RSV plus assay is intended as an aid in the diagnosis of influenza from Nasopharyngeal swab specimens and should not be used as a sole basis for treatment. Nasal washings and aspirates are unacceptable for Xpert Xpress SARS-CoV-2/FLU/RSV testing.  Fact Sheet for Patients: EntrepreneurPulse.com.au  Fact Sheet for Healthcare  Providers: IncredibleEmployment.be  This test is not yet approved or cleared by the [place] FDA and has been authorized for detection and/or diagnosis of SARS-CoV-2 by FDA under an Emergency Use Authorization (EUA). This EUA will remain in effect (meaning this test can be used) for the duration of the COVID-19 declaration under Section 564(b)(1) of the Act, 21 U.S.C. section 360bbb-3(b)(1), unless the authorization is terminated or revoked.  Performed at [facility], [place] [address] [place], [place] [address]      Time coordinating discharge: Over 30 minutes  SIGNED:   Phillips Climes, MD  [facility] 08/20/2021, 12:21 PM Pager   If 7PM-7AM, please contact night-coverage [email or web address] Password [email or web address]

## 2021-08-20 NOTE — TOC Initial Note (Addendum)
Transition of Care (TOC) - Initial/Assessment Note    Patient Details  Name: Mathew Miller. MRN: [id number] Date of Birth: 04-25-1946  Transition of Care [facility]) CM/SW Contact:    Mathew Carmine, RN Phone Number: 08/20/2021, 8:43 AM  Clinical Narrative:                  75 year old admitted for AMS.  Has history of CKD, hypertension, HLD, stroke dementia. Had previous home health with Mathew Miller. Wife stated she wanted to take him home with home health. PT recommended SNF.  Has DME at home, Mathew Miller, wheelchair, Mathew Miller. Marland Kitchen  Spoke t wife Mathew Miller regarding home health choice  She would like [name] back, she is agreeable to PT OT,aide and social work. She feels like he does better at home., versus SNF, he was at [name] last year and she states he got half of PT  done due to his BP being erratic.  Messaged Mathew Miller at [name] for acceptance Mathew Miller from [facility] accepted patient  Expected Discharge Plan: [name] Barriers to Discharge: Continued Medical Work up   Patient Goals and CMS Choice  Home Health      Expected Discharge Plan and Services Expected Discharge Plan: [name]   Discharge Planning Services: CM Consult   Living arrangements for the past 2 months: Single Family Home                                      Prior Living Arrangements/Services Living arrangements for the past 2 months: Single Family Home Lives with:: Spouse Patient language and need for interpreter reviewed:: Yes        Need for Family Participation in Patient Care: Yes (Comment) Care giver support system in place?: Yes (comment) Current home services: DME (has walker, wheelchair [facility]) Criminal Activity/Legal Involvement Pertinent to Current Situation/Hospitalization: No - Comment as needed  Activities of Daily Living      Permission Sought/Granted   Permission granted to share information with : Yes, Verbal Permission Granted               Emotional Assessment       Orientation: : Fluctuating Orientation (Suspected and/or reported [place]) Alcohol / Substance Use: Not Applicable Psych Involvement: No (comment)  Admission diagnosis:  AKI (acute kidney injury) ([name]) [N17.9] Sepsis ([name]) [A41.9] Altered mental status, unspecified altered mental status type [R41.82] Sepsis with acute renal failure, due to unspecified organism, unspecified acute renal failure type, unspecified whether septic shock present ([name]) [A41.9, R65.20, N17.9] Patient Active Problem List   Diagnosis Date Noted   Sepsis ([place]) 08/19/2021   Acute-on-chronic kidney injury ([place]) 08/19/2021   Orthostatic hypotension 12/30/2020   Prolonged QT interval 12/30/2020   Dementia ([place])    Palliative care by specialist    CVA (cerebral vascular accident) ([place]) 07/15/2020   Hypokalemia    B12 deficiency 03/26/2020   Unstable gait 10/01/2019   Frequent falls 10/01/2019   Imbalance 02/27/2018   BMI 24.0-24.9, adult 02/26/2018   Acid reflux 02/26/2018   Memory changes 02/26/2018   Vitamin D deficiency 03/01/2014   Goals of care, counseling/discussion 03/01/2014   Hyperlipidemia    History of prediabetes    HTN (hypertension) 10/09/2013   PCP:  Mathew Pinto, MD Pharmacy:   [facility] [place], [place]  [place] [address] Phone: [phone] Fax: [phone]  [facility] [place] [place] [address] Phone: [phone] Fax: [phone]     Social Determinants of Health (SDOH) Interventions    Readmission Risk Interventions No flowsheet data found.

## 2021-08-20 NOTE — Discharge Instructions (Signed)
Follow with Primary MD Unk Pinto, MD in 7 days   Get CBC, CMP, checked  by Primary MD next visit.    Activity: As tolerated with Full fall precautions use walker/cane & assistance as needed   Disposition Home    Diet: Heart Healthy , with feeding assistance and aspiration precautions.    On your next visit with your primary care physician please Get Medicines reviewed and adjusted.   Please request your Prim.MD to go over all Hospital Tests and Procedure/Radiological results at the follow up, please get all Hospital records sent to your Prim MD by signing hospital release before you go home.   If you experience worsening of your admission symptoms, develop shortness of breath, life threatening emergency, suicidal or homicidal thoughts you must seek medical attention immediately by calling 911 or calling your MD immediately  if symptoms less severe.  You Must read complete instructions/literature along with all the possible adverse reactions/side effects for all the Medicines you take and that have been prescribed to you. Take any new Medicines after you have completely understood and accpet all the possible adverse reactions/side effects.   Do not drive, operating heavy machinery, perform activities at heights, swimming or participation in water activities or provide baby sitting services if your were admitted for syncope or siezures until you have seen by Primary MD or a Neurologist and advised to do so again.  Do not drive when taking Pain medications.    Do not take more than prescribed Pain, Sleep and Anxiety Medications  Special Instructions: If you have smoked or chewed Tobacco  in the last 2 yrs please stop smoking, stop any regular Alcohol  and or any Recreational drug use.  Wear Seat belts while driving.   Please note  You were cared for by a hospitalist during your hospital stay. If you have any questions about your discharge medications or the care you  received while you were in the hospital after you are discharged, you can call the unit and asked to speak with the hospitalist on call if the hospitalist that took care of you is not available. Once you are discharged, your primary care physician will handle any further medical issues. Please note that NO REFILLS for any discharge medications will be authorized once you are discharged, as it is imperative that you return to your primary care physician (or establish a relationship with a primary care physician if you do not have one) for your aftercare needs so that they can reassess your need for medications and monitor your lab values.

## 2021-08-20 NOTE — TOC Transition Note (Signed)
Transition of Care [facility]) - CM/SW Discharge Note   Patient Details  Name: Mathew Miller. MRN: [id number] Date of Birth: 05/08/1946  Transition of Care [facility]) CM/SW Contact:  Angelita Ingles, RN Phone [phone]  08/20/2021, 3:41 PM   Clinical Narrative:    Message received requesting transportation for patient being discharged. Transportation has been set up via [employer]. Wife has been update. Discharge packet given to nurse. No other needs noted at this time. TOC will sign off.    Final next level of care: Home w Home Health Services Barriers to Discharge: No Barriers Identified   Patient Goals and CMS Choice        Discharge Placement                       Discharge Plan and Services   Discharge Planning Services: CM Consult                                 Social Determinants of Health (SDOH) Interventions     Readmission Risk Interventions No flowsheet data found.

## 2021-08-21 ENCOUNTER — Telehealth: Payer: Self-pay | Admitting: Internal Medicine

## 2021-08-21 NOTE — Chronic Care Management (AMB) (Signed)
  Chronic Care Management   Note  08/21/2021 Name: Nakul Avino. MRN: [id number] DOB: [birth year]  Joden Bonsall. is a 75 y.o. year old male who is a primary care patient of Unk Pinto, MD. I reached out to [name] by phone today in response to a referral sent by Mr. Gwyndolyn Saxon Jr.'s PCP, Unk Pinto, MD.   Mr. Hearn was given information about Chronic Care Management services today including:  CCM service includes personalized support from designated clinical staff supervised by his physician, including individualized plan of care and coordination with other care providers 24/7 contact phone numbers for assistance for urgent and routine care needs. Service will only be billed when office clinical staff spend 20 minutes or more in a month to coordinate care. Only one practitioner may furnish and bill the service in a calendar month. The patient may stop CCM services at any time (effective at the end of the month) by phone call to the office staff.   JUDITH Pick/ SPOUSE verbally agreed to assistance and services provided by embedded care coordination/care management team today.  Follow up plan:   Tatjana Secretary/administrator

## 2021-08-22 ENCOUNTER — Emergency Department (HOSPITAL_COMMUNITY): Payer: Medicare HMO

## 2021-08-22 ENCOUNTER — Inpatient Hospital Stay (HOSPITAL_COMMUNITY)
Admission: EM | Admit: 2021-08-22 | Discharge: 2021-09-02 | DRG: 864 | Disposition: A | Discharge status: Skilled Nursing Facility | Payer: Medicare HMO | Attending: Student | Admitting: Student

## 2021-08-22 DIAGNOSIS — R404 Transient alteration of awareness: Secondary | ICD-10-CM | POA: Diagnosis not present

## 2021-08-22 DIAGNOSIS — G9341 Metabolic encephalopathy: Secondary | ICD-10-CM | POA: Diagnosis not present

## 2021-08-22 DIAGNOSIS — H109 Unspecified conjunctivitis: Secondary | ICD-10-CM | POA: Diagnosis present

## 2021-08-22 DIAGNOSIS — R9431 Abnormal electrocardiogram [ECG] [EKG]: Secondary | ICD-10-CM | POA: Diagnosis present

## 2021-08-22 DIAGNOSIS — Z7189 Other specified counseling: Secondary | ICD-10-CM

## 2021-08-22 DIAGNOSIS — F0280 Dementia in other diseases classified elsewhere without behavioral disturbance: Secondary | ICD-10-CM | POA: Diagnosis present

## 2021-08-22 DIAGNOSIS — G20 Parkinson's disease: Secondary | ICD-10-CM | POA: Diagnosis present

## 2021-08-22 DIAGNOSIS — R000 Tachycardia, unspecified: Secondary | ICD-10-CM | POA: Diagnosis not present

## 2021-08-22 DIAGNOSIS — E559 Vitamin D deficiency, unspecified: Secondary | ICD-10-CM | POA: Diagnosis present

## 2021-08-22 DIAGNOSIS — R0689 Other abnormalities of breathing: Secondary | ICD-10-CM | POA: Diagnosis not present

## 2021-08-22 DIAGNOSIS — E876 Hypokalemia: Secondary | ICD-10-CM | POA: Diagnosis present

## 2021-08-22 DIAGNOSIS — E440 Moderate protein-calorie malnutrition: Secondary | ICD-10-CM | POA: Diagnosis present

## 2021-08-22 DIAGNOSIS — R4182 Altered mental status, unspecified: Secondary | ICD-10-CM | POA: Diagnosis present

## 2021-08-22 DIAGNOSIS — B957 Other staphylococcus as the cause of diseases classified elsewhere: Secondary | ICD-10-CM | POA: Diagnosis present

## 2021-08-22 DIAGNOSIS — R509 Fever, unspecified: Secondary | ICD-10-CM | POA: Diagnosis not present

## 2021-08-22 DIAGNOSIS — Z23 Encounter for immunization: Secondary | ICD-10-CM

## 2021-08-22 DIAGNOSIS — Z79899 Other long term (current) drug therapy: Secondary | ICD-10-CM

## 2021-08-22 DIAGNOSIS — Z8249 Family history of ischemic heart disease and other diseases of the circulatory system: Secondary | ICD-10-CM

## 2021-08-22 DIAGNOSIS — Z888 Allergy status to other drugs, medicaments and biological substances status: Secondary | ICD-10-CM

## 2021-08-22 DIAGNOSIS — I639 Cerebral infarction, unspecified: Secondary | ICD-10-CM | POA: Diagnosis present

## 2021-08-22 DIAGNOSIS — Z66 Do not resuscitate: Secondary | ICD-10-CM | POA: Diagnosis present

## 2021-08-22 DIAGNOSIS — Z7982 Long term (current) use of aspirin: Secondary | ICD-10-CM

## 2021-08-22 DIAGNOSIS — R7881 Bacteremia: Secondary | ICD-10-CM | POA: Diagnosis present

## 2021-08-22 DIAGNOSIS — R7303 Prediabetes: Secondary | ICD-10-CM | POA: Diagnosis present

## 2021-08-22 DIAGNOSIS — I10 Essential (primary) hypertension: Secondary | ICD-10-CM | POA: Diagnosis not present

## 2021-08-22 DIAGNOSIS — Z20822 Contact with and (suspected) exposure to covid-19: Secondary | ICD-10-CM | POA: Diagnosis present

## 2021-08-22 DIAGNOSIS — R296 Repeated falls: Secondary | ICD-10-CM | POA: Diagnosis present

## 2021-08-22 DIAGNOSIS — E860 Dehydration: Secondary | ICD-10-CM | POA: Diagnosis present

## 2021-08-22 DIAGNOSIS — N1831 Chronic kidney disease, stage 3a: Secondary | ICD-10-CM | POA: Diagnosis present

## 2021-08-22 DIAGNOSIS — E8809 Other disorders of plasma-protein metabolism, not elsewhere classified: Secondary | ICD-10-CM | POA: Diagnosis present

## 2021-08-22 DIAGNOSIS — E785 Hyperlipidemia, unspecified: Secondary | ICD-10-CM | POA: Diagnosis present

## 2021-08-22 DIAGNOSIS — I700 Atherosclerosis of aorta: Secondary | ICD-10-CM | POA: Diagnosis present

## 2021-08-22 DIAGNOSIS — J309 Allergic rhinitis, unspecified: Secondary | ICD-10-CM | POA: Diagnosis present

## 2021-08-22 DIAGNOSIS — K5900 Constipation, unspecified: Secondary | ICD-10-CM | POA: Diagnosis present

## 2021-08-22 DIAGNOSIS — R402431 Glasgow coma scale score 3-8, in the field [EMT or ambulance]: Secondary | ICD-10-CM | POA: Diagnosis not present

## 2021-08-22 DIAGNOSIS — Z885 Allergy status to narcotic agent status: Secondary | ICD-10-CM

## 2021-08-22 DIAGNOSIS — R339 Retention of urine, unspecified: Secondary | ICD-10-CM | POA: Diagnosis present

## 2021-08-22 DIAGNOSIS — R6510 Systemic inflammatory response syndrome (SIRS) of non-infectious origin without acute organ dysfunction: Secondary | ICD-10-CM

## 2021-08-22 DIAGNOSIS — K219 Gastro-esophageal reflux disease without esophagitis: Secondary | ICD-10-CM | POA: Diagnosis present

## 2021-08-22 DIAGNOSIS — I129 Hypertensive chronic kidney disease with stage 1 through stage 4 chronic kidney disease, or unspecified chronic kidney disease: Secondary | ICD-10-CM | POA: Diagnosis present

## 2021-08-22 DIAGNOSIS — I69354 Hemiplegia and hemiparesis following cerebral infarction affecting left non-dominant side: Secondary | ICD-10-CM

## 2021-08-22 DIAGNOSIS — I951 Orthostatic hypotension: Secondary | ICD-10-CM | POA: Diagnosis present

## 2021-08-22 DIAGNOSIS — F0390 Unspecified dementia without behavioral disturbance: Secondary | ICD-10-CM | POA: Diagnosis present

## 2021-08-22 DIAGNOSIS — Z6820 Body mass index (BMI) 20.0-20.9, adult: Secondary | ICD-10-CM

## 2021-08-22 LAB — CBC WITH DIFFERENTIAL/PLATELET
Abs Immature Granulocytes: 0.15 10*3/uL — ABNORMAL HIGH (ref 0.00–0.07)
Basophils Absolute: 0 10*3/uL (ref 0.0–0.1)
Basophils Relative: 0 %
Eosinophils Absolute: 0 10*3/uL (ref 0.0–0.5)
Eosinophils Relative: 0 %
HCT: 36.9 % — ABNORMAL LOW (ref 39.0–52.0)
Hemoglobin: 12.5 g/dL — ABNORMAL LOW (ref 13.0–17.0)
Immature Granulocytes: 2 %
Lymphocytes Relative: 3 %
Lymphs Abs: 0.3 10*3/uL — ABNORMAL LOW (ref 0.7–4.0)
MCH: 30.6 pg (ref 26.0–34.0)
MCHC: 33.9 g/dL (ref 30.0–36.0)
MCV: 90.2 fL (ref 80.0–100.0)
Monocytes Absolute: 0.3 10*3/uL (ref 0.1–1.0)
Monocytes Relative: 3 %
Neutro Abs: 8.8 10*3/uL — ABNORMAL HIGH (ref 1.7–7.7)
Neutrophils Relative %: 92 %
Platelets: 150 10*3/uL (ref 150–400)
RBC: 4.09 MIL/uL — ABNORMAL LOW (ref 4.22–5.81)
RDW: 13.5 % (ref 11.5–15.5)
WBC: 9.5 10*3/uL (ref 4.0–10.5)
nRBC: 0 % (ref 0.0–0.2)

## 2021-08-22 LAB — URINALYSIS, ROUTINE W REFLEX MICROSCOPIC
Bacteria, UA: NONE SEEN
Bilirubin Urine: NEGATIVE
Glucose, UA: NEGATIVE mg/dL
Ketones, ur: 20 mg/dL — AB
Nitrite: NEGATIVE
Protein, ur: 30 mg/dL — AB
RBC / HPF: >50 RBC/hpf — ABNORMAL HIGH (ref 0–5)
Specific Gravity, Urine: 1.025 (ref 1.005–1.030)
pH: 5 (ref 5.0–8.0)

## 2021-08-22 LAB — I-STAT VENOUS BLOOD GAS, ED
Acid-base deficit: 1 mmol/L (ref 0.0–2.0)
Bicarbonate: 22.3 mmol/L (ref 20.0–28.0)
Calcium, Ion: 1.07 mmol/L — ABNORMAL LOW (ref 1.15–1.40)
HCT: 36 % — ABNORMAL LOW (ref 39.0–52.0)
Hemoglobin: 12.2 g/dL — ABNORMAL LOW (ref 13.0–17.0)
O2 Saturation: 96 %
Potassium: 3.4 mmol/L — ABNORMAL LOW (ref 3.5–5.1)
Sodium: 144 mmol/L (ref 135–145)
TCO2: 23 mmol/L (ref 22–32)
pCO2, Ven: 30.3 mmHg — ABNORMAL LOW (ref 44.0–60.0)
pH, Ven: 7.475 — ABNORMAL HIGH (ref 7.250–7.430)
pO2, Ven: 76 mmHg — ABNORMAL HIGH (ref 32.0–45.0)

## 2021-08-22 LAB — COMPREHENSIVE METABOLIC PANEL
ALT: 11 U/L (ref 0–44)
AST: 13 U/L — ABNORMAL LOW (ref 15–41)
Albumin: 2.4 g/dL — ABNORMAL LOW (ref 3.5–5.0)
Alkaline Phosphatase: 59 U/L (ref 38–126)
Anion gap: 8 (ref 5–15)
BUN: 24 mg/dL — ABNORMAL HIGH (ref 8–23)
CO2: 22 mmol/L (ref 22–32)
Calcium: 7.6 mg/dL — ABNORMAL LOW (ref 8.9–10.3)
Chloride: 113 mmol/L — ABNORMAL HIGH (ref 98–111)
Creatinine, Ser: 1.45 mg/dL — ABNORMAL HIGH (ref 0.61–1.24)
GFR, Estimated: 50 mL/min — ABNORMAL LOW (ref >60)
Glucose, Bld: 117 mg/dL — ABNORMAL HIGH (ref 70–99)
Potassium: 3.4 mmol/L — ABNORMAL LOW (ref 3.5–5.1)
Sodium: 143 mmol/L (ref 135–145)
Total Bilirubin: 0.9 mg/dL (ref 0.3–1.2)
Total Protein: 4.4 g/dL — ABNORMAL LOW (ref 6.5–8.1)

## 2021-08-22 LAB — LACTIC ACID, PLASMA
Lactic Acid, Venous: 1.1 mmol/L (ref 0.5–1.9)
Lactic Acid, Venous: 1.3 mmol/L (ref 0.5–1.9)

## 2021-08-22 LAB — RESP PANEL BY RT-PCR (FLU A&B, COVID) ARPGX2
Influenza A by PCR: NEGATIVE
Influenza B by PCR: NEGATIVE
SARS Coronavirus 2 by RT PCR: NEGATIVE

## 2021-08-22 LAB — POC OCCULT BLOOD, ED: Fecal Occult Bld: NEGATIVE

## 2021-08-22 LAB — PROTIME-INR
INR: 1.4 — ABNORMAL HIGH (ref 0.8–1.2)
Prothrombin Time: 16.8 seconds — ABNORMAL HIGH (ref 11.4–15.2)

## 2021-08-22 LAB — APTT: aPTT: 29 seconds (ref 24–36)

## 2021-08-22 MED ORDER — MORPHINE SULFATE (PF) 2 MG/ML IV SOLN
2.0000 mg | INTRAVENOUS | Status: DC | PRN
  Administered 2021-08-24: 2 mg via INTRAVENOUS
  Filled 2021-08-22: qty 1

## 2021-08-22 MED ORDER — VANCOMYCIN HCL 1250 MG/250ML IV SOLN
1250.0000 mg | INTRAVENOUS | Status: DC
  Administered 2021-08-23 – 2021-08-26 (×4): 1250 mg via INTRAVENOUS
  Filled 2021-08-22 (×5): qty 250

## 2021-08-22 MED ORDER — ACETAMINOPHEN 650 MG RE SUPP
650.0000 mg | Freq: Once | RECTAL | Status: AC
  Administered 2021-08-22: 650 mg via RECTAL
  Filled 2021-08-22: qty 1

## 2021-08-22 MED ORDER — SODIUM CHLORIDE 0.9 % IV SOLN
2.0000 g | Freq: Two times a day (BID) | INTRAVENOUS | Status: DC
  Administered 2021-08-22 – 2021-08-23 (×3): 2 g via INTRAVENOUS
  Filled 2021-08-22 (×3): qty 2

## 2021-08-22 MED ORDER — ACETAMINOPHEN 650 MG RE SUPP: 650.0000 mg | Freq: Four times a day (QID) | RECTAL | Status: DC | PRN

## 2021-08-22 MED ORDER — SODIUM CHLORIDE 0.9% FLUSH
3.0000 mL | Freq: Two times a day (BID) | INTRAVENOUS | Status: DC
  Administered 2021-08-22 – 2021-09-01 (×14): 3 mL via INTRAVENOUS

## 2021-08-22 MED ORDER — POLYETHYLENE GLYCOL 3350 17 G PO PACK: 17.0000 g | PACK | Freq: Every day | ORAL | Status: DC | PRN

## 2021-08-22 MED ORDER — BISACODYL 10 MG RE SUPP: 10.0000 mg | Freq: Every day | RECTAL | Status: DC | PRN

## 2021-08-22 MED ORDER — LACTATED RINGERS IV SOLN: INTRAVENOUS | Status: DC

## 2021-08-22 MED ORDER — ENOXAPARIN SODIUM 40 MG/0.4ML IJ SOSY
40.0000 mg | PREFILLED_SYRINGE | INTRAMUSCULAR | Status: DC
  Administered 2021-08-22 – 2021-09-01 (×11): 40 mg via SUBCUTANEOUS
  Filled 2021-08-22 (×11): qty 0.4

## 2021-08-22 MED ORDER — ACETAMINOPHEN 325 MG PO TABS: 650.0000 mg | ORAL_TABLET | Freq: Four times a day (QID) | ORAL | Status: DC | PRN

## 2021-08-22 MED ORDER — VANCOMYCIN HCL 1500 MG/300ML IV SOLN
1500.0000 mg | Freq: Once | INTRAVENOUS | Status: AC
  Administered 2021-08-22: 1500 mg via INTRAVENOUS
  Filled 2021-08-22: qty 300

## 2021-08-22 MED ORDER — SODIUM CHLORIDE 0.9 % IV SOLN: 1.0000 g | Freq: Once | INTRAVENOUS | Status: DC

## 2021-08-22 MED ORDER — SODIUM CHLORIDE 0.9 % IV BOLUS
1000.0000 mL | Freq: Once | INTRAVENOUS | Status: AC
  Administered 2021-08-22: 1000 mL via INTRAVENOUS

## 2021-08-22 MED ORDER — HYDRALAZINE HCL 20 MG/ML IJ SOLN: 5.0000 mg | INTRAMUSCULAR | Status: DC | PRN

## 2021-08-22 NOTE — ED Notes (Signed)
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 No changes.

## 2021-08-22 NOTE — ED Triage Notes (Signed)
Pt BIB EMS due to AMS. Pt was here two days ago with a UTI. Wife states pt was unresponsive and more altered and called 911. Pt has a GCS of 3 per EMS. Pt arrived with nrb. Pt has a dnr form at bedside.

## 2021-08-22 NOTE — ED Notes (Signed)
Pt sleeping/ somnolent, HOB 45 degrees, deep respirations, NAD, calm, IVF bolus complete, vanc infusing. Wife at [facility]. Denies specific questions at this time. DNR at [facility]. VSS.

## 2021-08-22 NOTE — ED Notes (Signed)
Patient transported to CT 

## 2021-08-22 NOTE — H&P (Signed)
History and Physical    Mathew Miller. IFO:[id number] DOB: 05/06/1946 DOA: 08/22/2021  PCP: Unk Pinto, MD Consultants:  Tomi Likens - neurology; Turner - cardiology Patient coming from:  Home - lives with wife; NOK: Wife, [phone]  Chief Complaint: AMS  HPI: Mathew Miller. is a 75 y.o. male with medical history significant of HTN; HLD; stage 3a CKD; advanced dementia; dysautonomia with postural hypotension; h/o CVA; and urinary retention with foley catheter placement presenting with sepsis.  He was previously admitted from 11/1-3 with the same, started on antibiotics, but no source was found and he was discharged with indwelling foley.   This AM, he had a fever to 105.7 on his forehead and his wife was alarmed.  He had been breathing heavily all night.  She did not think to check if he had a fever upon arrival home at 830 last night.  His wife reports that normally he can transfer to the bathroom with a walker and help.  He calls her when she leaves his sight.  He has been able to feed himself until the last week but recently it has been easier for her to feed him.  He had a stroke a year ago and has had limited LUE strength since then.  He usually is able to communicate but this AM he did not speak at all.  Several times during the night she checked on him, he had such a deep intermittent cough.  She was able to give him a little water and he was able to drink it.  He has not been able to answer her today.  He was able to communicate with his normal level of communication yesterday.  He stays confused most of the time.  He was previously as Publishing copy for rehab and he had BP lability and so was only able to get rehab maybe half of those days.  He had a hospice evaluation maybe 6 months ago and "he wasn't ready for hospice."     ED Course: Bounceback from 2 days ago.  Same story - fever, AMS.  Ruled out for infection and sent home.  Bad dementia, wife present.  DNR.  Very dry.  Likely  needs palliative care.  Has fever, hemodynamically stable.  Foley placed last hospitalization.  Given Vanc/Cefepime.  ?aspirating.  On room air, blood gas okay.  Review of Systems: Unable to perform  Ambulatory Status:  Ambulates with a walker  COVID Vaccine Status:  Complete  Past Medical History:  Diagnosis Date   Constipation    Hyperlipidemia    Hypertension    Prediabetes    no meds    Vitamin D deficiency     Past Surgical History:  Procedure Laterality Date   CATARACT EXTRACTION, BILATERAL  2017   COLONOSCOPY     NO PAST SURGERIES      Social History   Socioeconomic History   Marital status: Married    Spouse name: Not on file   Number of children: Not on file   Years of education: Not on file   Highest education level: Not on file  Occupational History   Occupation: retired   Tobacco Use   Smoking status: Never   Smokeless tobacco: Never  Vaping Use   Vaping Use: Never used  Substance and Sexual Activity   Alcohol use: Yes    Alcohol/week: 0.0 standard drinks    Comment: very Rare /Beer.wine.   Drug use: No   Sexual activity: Not on  file  Other Topics Concern   Not on file  Social History Narrative   Not on file   Social Determinants of Health   Financial Resource Strain: Not on file  Food Insecurity: Not on file  Transportation Needs: Not on file  Physical Activity: Not on file  Stress: Not on file  Social Connections: Not on file  Intimate Partner Violence: Not on file    Allergies  Allergen Reactions   Hydrocodone-Acetaminophen Nausea Only   Sudafed [Pseudoephedrine Hcl]     Urinary retention   Tetanus Toxoids     Itching, burning feeling, ran a fever    Family History  Problem Relation Age of Onset   Hyperlipidemia Mother    Glaucoma Mother    Heart disease Father    Hypertension Father    Colon cancer Neg Hx    Esophageal cancer Neg Hx    Pancreatic cancer Neg Hx    Prostate cancer Neg Hx    Rectal cancer Neg Hx    Stomach  cancer Neg Hx     Prior to Admission medications   Medication Sig Start Date End Date Taking? Authorizing Provider  Artificial Tear Ointment (DRY EYES OP) Apply 1 drop to eye 2 (two) times daily as needed (dry eyes).    [provider]  aspirin EC 81 MG tablet Take 81 mg by mouth daily. Swallow whole.    [provider]  BELSOMRA 15 MG TABS Take 15 mg by mouth daily. 06/20/21   [provider]  calcium-vitamin D (OSCAL WITH D) 500MG -200UNIT (5MCG) tablet Take 2 tablets by mouth 3 (three) times daily. 08/20/21   Elgergawy, Silver Huguenin, MD  Cholecalciferol (VITAMIN D) 125 MCG (5000 UT) CAPS Take 5,000 Units by mouth daily.    [provider]  Cyanocobalamin (B-12 PO) Take 1,000 mcg by mouth daily.    [provider]  donepezil (ARICEPT) 10 MG tablet TAKE 1 TABLET AT BEDTIME Patient taking differently: Take 10 mg by mouth at bedtime. 07/21/21   Pieter Partridge, DO  Magnesium 250 MG TABS Take 250 mg by mouth daily.    [provider]  mirabegron ER (MYRBETRIQ) 50 MG TB24 tablet Take 1 tablet Daily for OverActive Bladder Patient taking differently: Take 50 mg by mouth daily. 08/09/21   Unk Pinto, MD  polyethylene glycol (MIRALAX / GLYCOLAX) 17 g packet Take 17 g by mouth daily as needed for mild constipation.    [provider]  potassium chloride SA (KLOR-CON) 20 MEQ tablet TAKE 1 TABLET TWICE DAILY  (FOR POTASSIUM REPLACEMENT) Patient taking differently: Take 20 mEq by mouth 2 (two) times daily. 01/14/21   Unk Pinto, MD  pyridostigmine (MESTINON) 60 MG tablet TAKE 1 TABLET IN THE MORNING AND AT BEDTIME Patient taking differently: Take 60 mg by mouth in the morning and at bedtime. 08/05/21   Sueanne Margarita, MD  rosuvastatin (CRESTOR) 5 MG tablet TAKE 1 TABLET EVERY DAY FOR CHOLESTEROL Patient taking differently: Take 5 mg by mouth daily. 12/22/20   Liane Comber, NP  senna-docusate (GNP STOOL SOFTENER/LAXATIVE) 8.6-50 MG tablet  Take 1 tablet Daily for Constipation Patient taking differently: Take 1 tablet by mouth daily. 09/25/20   Unk Pinto, MD  tamsulosin (FLOMAX) 0.4 MG CAPS capsule Take 1 capsule (0.4 mg total) by mouth daily after supper. 08/20/21   Elgergawy, Silver Huguenin, MD    Physical Exam: Vitals:   08/22/21 1630 08/22/21 1700 08/22/21 1730 08/22/21 1817  BP: (!) 142/78 Marland Kitchen)  175/85 107/65 139/66  Pulse: 64 64 65 (!) 56  Resp:    16  Temp:    98.5 F (36.9 C)  TempSrc:    Oral  SpO2: 98% 98% 96% 98%     General:  Appears generally unresponsive and seriously ill Eyes:  normal lids, closed throughout other than minimal effort to open ENT:  grossly normal lips & tongue, mildly dry mm Neck:  no LAD, masses or thyromegaly Cardiovascular:  RRR, no m/r/g. No LE edema.  Respiratory:   CTA bilaterally with no wheezes/rales/rhonchi.  Mildly increased respiratory effort with normal O2 sats on room air. Abdomen:  soft, NT, ND Skin:  no rash or induration seen on limited exam (wife reports contact dermatitis on his back from laundry detergent which is improving and no pressure ulcers) Musculoskeletal:   no bony abnormality Psychiatric:  obtunded, minimally responsive to pain Neurologic:  unable to perform, apparent LUE weakness    Radiological Exams on Admission: Independently reviewed - see discussion in A/P where applicable  CT HEAD WO CONTRAST (5MM)  Result Date: 08/22/2021 CLINICAL DATA:  Altered mental status. EXAM: CT HEAD WITHOUT CONTRAST TECHNIQUE: Contiguous axial images were obtained from the base of the skull through the vertex without intravenous contrast. COMPARISON:  August 19, 2021 FINDINGS: Brain: No evidence of acute infarction, hemorrhage, hydrocephalus, extra-axial collection or mass lesion/mass effect. Marked brain parenchymal volume loss and deep white matter microangiopathy. Vascular: No hyperdense vessel or unexpected calcification. Skull: Normal. Negative for fracture or focal  lesion. Sinuses/Orbits: No acute finding. Other: None. IMPRESSION: 1. No acute intracranial abnormality. 2. Marked brain parenchymal atrophy and chronic microvascular disease. Electronically Signed   By: Fidela Salisbury M.D.   On: 08/22/2021 16:36   DG Chest Port 1 View  Result Date: 08/22/2021 CLINICAL DATA:  Questionable sepsis. EXAM: PORTABLE CHEST 1 VIEW COMPARISON:  August 18, 2021. FINDINGS: Patient is rotated. Aortic atherosclerosis. Mediastinal contours or stable with mildly tortuous thoracic aorta. Normal size heart. Chronic senescent lung changes. Streaky bibasilar opacities are similar to prior. No visible pleural effusion or pneumothorax. The visualized skeletal structures are unchanged including chronic left lateral fifth rib fracture. IMPRESSION: 1. No acute cardiopulmonary finding. 2.  Aortic Atherosclerosis (ICD10-I70.0). Electronically Signed   By: Dahlia Bailiff M.D.   On: 08/22/2021 15:25    EKG: Independently reviewed.  NSR with rate 89; prolonged QTc 507; no evidence of acute ischemia   Labs on Admission: I have personally reviewed the available labs and imaging studies at the time of the admission.  Pertinent labs:   Glucose 117 BUN 24/Creatinine 1.45/GFR 50 Albumin 2.4 WBC 9.5 INR 1.4 UA: large Hgb, 20 ketones, trace LE, 30 protein, >50 RBC ABG 7.475/30.3/76/96% Lactate 1.1, 1.3 Heme negative   Assessment/Plan Principal Problem:   Fever Active Problems:   Hyperlipidemia   Goals of care, counseling/discussion   CVA (cerebral vascular accident) ([place])   Dementia (HCC)   Prolonged QT interval   Acute metabolic encephalopathy   Fever, AMS -Patient with recent admission for the same, thought to be infectious on admission but no cause identified and he appeared to improve -He was discharged yesterday and is back today with the same  -SIRS criteria in this patient includes: fever, tachypnea -Patient has NO evidence of acute organ failure that is not easily  explained by another condition and so does not currently appear to be c/w sepsis. -Suspected source if fever is aspiration pneumonitis associated with advanced dementia -Blood and urine cultures pending -Will  place in observation status with telemetry and continue to monitor -Treat with IV Cefepime/Vanc for possible aspiration coverage -Will hold home meds while altered -PT/OT/ST consults requested  Stage 3a CKD -Appears to be at/near baseline at this time -He does have evidence of dehydration on his UA -Will hydrate and follow  Advanced dementia -His wife and I discussed goals of care -She is exhausted and is not sure that she can continue to care for him at home in his current condition -Options include IVF/abx with improvement and then return home; vs. Placement at [facility]; vs. Residential hospice if he does not improve -Palliative care and [facility] team consults requested -He has had dysautonomia with postural hypotension (?Parkinsons) and has been encouraged to avoid antihypertensive medications -Hold Aricept, Mestinon for now  Urinary retention -Has indwelling foley in place from prior hospitalization -UA shows evidence of dehydration but is not overly consistent with UTI -Urine culture is pending  H/o CVA -Has LUE weakness -Hold Crestor and ASA for now due to concern for aspiration  Prolonged QTc -Likely associated with dehydration, anticipate resolution once volume status is normalized -Will attempt to avoid QT-prolonging medications such as PPI, nausea meds, SSRIs    Note: This patient has been tested and is negative for the novel coronavirus COVID-19. He has been fully vaccinated against COVID-19.    DVT prophylaxis:  Lovenox Code Status:  DNR - confirmed with patient Family Communication: Wife present throughout evaluation  Disposition Plan:  The patient is from: home  Anticipated d/c is to: be determined  Anticipated d/c date will depend on clinical response to  treatment, but possibly as early as tomorrow if he has excellent response to treatment  Patient is currently: acutely ill Consults called: PT/OT/ST/TOC team Admission status: It is my clinical opinion that referral for OBSERVATION is reasonable and necessary in this patient based on the above information provided. The aforementioned taken together are felt to place the patient at high risk for further clinical deterioration. However it is anticipated that the patient may be medically stable for discharge from the hospital within 24 to 48 hours.    Karmen Bongo MD [facility]   How to contact the [facility] Attending or Consulting provider [place] or covering provider during after hours [place], for this patient?  Check the care team in [facility] and look for a) attending/consulting TRH provider listed and b) the [facility] team listed Log into [email or web address] and use [place] universal password to access. If you do not have the password, please contact the hospital operator. Locate the [facility] provider you are looking for under [facility] and page to a number that you can be directly reached. If you still have difficulty reaching the provider, please page the [facility] (Director on Call) for the Hospitalists listed on amion for assistance.   08/22/2021, 6:47 PM

## 2021-08-22 NOTE — TOC Initial Note (Signed)
Transition of Care (TOC) - Initial/Assessment Note    Patient Details  Name: Mathew Miller. MRN: [id number] Date of Birth: 06/19/1946  Transition of Care [facility]) CM/SW Contact:    Verdell Carmine, RN Phone Number: 08/22/2021, 5:35 PM  Clinical Narrative:                 DC from 67 [address] yesterday, presented to ED with AMS, fever . PT evaluation from hospitalization was recommended SNF. Wife declined SNF citing reasons of  not enough PT, seemed better at home in familiar surroundings.   PT is again consulted, and patient will likely go to SNF after DC.  [name] from [facility] notified on patient admission.  CM/CSW will follow fo rnneds, recommendations, and transitions.  Expected Discharge Plan: [place]     Patient Goals and CMS Choice        Expected Discharge Plan and Services Expected Discharge Plan: [place] In-house Referral: Clinical Social Work   Post Acute Care Choice: Durable Medical Equipment                                        Prior Living Arrangements/Services   Lives with:: Spouse Patient language and need for interpreter reviewed:: Yes        Need for Family Participation in Patient Care: Yes (Comment) Care giver support system in place?: Yes (comment)   Criminal Activity/Legal Involvement Pertinent to Current Situation/Hospitalization: No - Comment as needed  Activities of Daily Living      Permission Sought/Granted                  Emotional Assessment       Orientation: : Fluctuating Orientation (Suspected and/or reported Sundowners) Alcohol / Substance Use: Not Applicable Psych Involvement: No (comment)  Admission diagnosis:  Fever [R50.9] Patient Active Problem List   Diagnosis Date Noted   Fever 08/22/2021   Sepsis ([place]) 08/19/2021   Acute-on-chronic kidney injury ([place]) 08/19/2021   Orthostatic hypotension 12/30/2020   Prolonged QT interval 12/30/2020   Dementia ([place])     Palliative care by specialist    CVA (cerebral vascular accident) ([place]) 07/15/2020   Hypokalemia    B12 deficiency 03/26/2020   Unstable gait 10/01/2019   Frequent falls 10/01/2019   Imbalance 02/27/2018   BMI 24.0-24.9, adult 02/26/2018   Acid reflux 02/26/2018   Memory changes 02/26/2018   Vitamin D deficiency 03/01/2014   Goals of care, counseling/discussion 03/01/2014   Hyperlipidemia    History of prediabetes    HTN (hypertension) 10/09/2013   PCP:  Unk Pinto, MD Pharmacy:   [place], [address] [place] [address] Phone: [phone] Fax: [phone]  [facility] [place] [address] Phone: [phone] Fax: [phone]     Social Determinants of Health (SDOH) Interventions    Readmission Risk Interventions No flowsheet data found.

## 2021-08-22 NOTE — Progress Notes (Signed)
Pharmacy Antibiotic Note  Mathew Miller. is a 75 y.o. male admitted on 08/22/2021 with sepsis.  Pharmacy has been consulted for cefepime and vancomycin dosing.  Patient with a history of hypertension, hyperlipidemia, prediabetes, vitamin D deficiency, CKD stage IIIa, history of stroke, progressive dementia, multiple system atrophy, dysautonomia with postural hypotension, wheelchair dependent and unable to ambulate independently due to profound hypotension with standing, fall risk, sensorineural hearing loss, BPH, overactive bladder. Patient recently admitted for sepsis work-up secondary to presumed urinary source.  UCx at that time w/ multiple species -- lab recommended re-collect.  SCr 1.45 - near baseline WBC 9.5; LA 1.1; T 101.9 F  Plan: Cefepime 2g q12h Vancomycin 1500 mg once then 1250 mg q24hr (eAUC 520) unless change in renal function Trend WBC, Fever, Renal function, & Clinical course F/u cultures Levels at steady state De-escalate when able     Temp (24hrs), Avg:101.9 F (38.8 C), Min:101.9 F (38.8 C), Max:101.9 F (38.8 C)  Recent Labs  Lab 08/18/21 1107 08/19/21 0020 08/19/21 0300 08/22/21 1358  WBC 18.3*  --  13.9* 9.5  CREATININE 1.67*  --  1.47*  --   LATICACIDVEN 2.0* 2.4*  --   --     Estimated Creatinine Clearance: 43.2 mL/min (A) (by C-G formula based on SCr of 1.47 mg/dL (H)).    Allergies  Allergen Reactions   Hydrocodone-Acetaminophen Nausea Only   Sudafed [Pseudoephedrine Hcl]     Urinary retention   Tetanus Toxoids     Itching, burning feeling, ran a fever   Antimicrobials this admission: cefepime 11/5 >>  vancomycin 11/5 >>  Microbiology results: Pending  Thank you for allowing pharmacy to be a part of this patient's care.  Lorelei Pont, PharmD, BCPS 08/22/2021 2:24 PM ED Clinical Pharmacist -  [phone]

## 2021-08-22 NOTE — ED Provider Notes (Signed)
[facility] EMERGENCY DEPARTMENT Provider Note   CSN: [id number] Arrival date & time: 08/22/21  1351     History Chief Complaint  Patient presents with   Code Sepsis    Mathew Miller. is a 75 y.o. male.  Level 5 caveat due to dementia and altered mental status.  Patient here with his wife who provides history.  Was just discharged in the hospital yesterday.  Patient is with increased confusion.  Found to have a fever upon arrival here.  Not eating and not drinking.  Patient is DNR.  Family member was not aware of a fever.  Just seemed less responsive than he was at time of discharge.  The history is provided by a caregiver.  Altered Mental Status Presenting symptoms: confusion and unresponsiveness   Severity:  Severe Most recent episode:  Today Episode history:  Single Timing:  Constant Progression:  Unchanged Chronicity:  Recurrent Context: dementia and recent illness       Past Medical History:  Diagnosis Date   Constipation    Hyperlipidemia    Hypertension    Prediabetes    no meds    Vitamin D deficiency     Patient Active Problem List   Diagnosis Date Noted   Fever 08/22/2021   Sepsis ([place]) 08/19/2021   Acute-on-chronic kidney injury ([place]) 08/19/2021   Orthostatic hypotension 12/30/2020   Prolonged QT interval 12/30/2020   Dementia ([place])    Palliative care by specialist    CVA (cerebral vascular accident) ([place]) 07/15/2020   Hypokalemia    B12 deficiency 03/26/2020   Unstable gait 10/01/2019   Frequent falls 10/01/2019   Imbalance 02/27/2018   BMI 24.0-24.9, adult 02/26/2018   Acid reflux 02/26/2018   Memory changes 02/26/2018   Vitamin D deficiency 03/01/2014   Goals of care, counseling/discussion 03/01/2014   Hyperlipidemia    History of prediabetes    HTN (hypertension) 10/09/2013    Past Surgical History:  Procedure Laterality Date   CATARACT EXTRACTION, BILATERAL  2017   COLONOSCOPY     NO PAST SURGERIES          Family History  Problem Relation Age of Onset   Hyperlipidemia Mother    Glaucoma Mother    Heart disease Father    Hypertension Father    Colon cancer Neg Hx    Esophageal cancer Neg Hx    Pancreatic cancer Neg Hx    Prostate cancer Neg Hx    Rectal cancer Neg Hx    Stomach cancer Neg Hx     Social History   Tobacco Use   Smoking status: Never   Smokeless tobacco: Never  Vaping Use   Vaping Use: Never used  Substance Use Topics   Alcohol use: Yes    Alcohol/week: 0.0 standard drinks    Comment: very Rare /Beer.wine.   Drug use: No    Home Medications Prior to Admission medications   Medication Sig Start Date End Date Taking? Authorizing Provider  Artificial Tear Ointment (DRY EYES OP) Apply 1 drop to eye 2 (two) times daily as needed (dry eyes).    [provider]  aspirin EC 81 MG tablet Take 81 mg by mouth daily. Swallow whole.    [provider]  BELSOMRA 15 MG TABS Take 15 mg by mouth daily. 06/20/21   [provider]  calcium-vitamin D (OSCAL WITH D) 500MG -200UNIT (5MCG) tablet Take 2 tablets by mouth 3 (three) times daily. 08/20/21   Elgergawy, Silver Huguenin,  MD  Cholecalciferol (VITAMIN D) 125 MCG (5000 UT) CAPS Take 5,000 Units by mouth daily.    [provider]  Cyanocobalamin (B-12 PO) Take 1,000 mcg by mouth daily.    [provider]  donepezil (ARICEPT) 10 MG tablet TAKE 1 TABLET AT BEDTIME Patient taking differently: Take 10 mg by mouth at bedtime. 07/21/21   Pieter Partridge, DO  Magnesium 250 MG TABS Take 250 mg by mouth daily.    [provider]  mirabegron ER (MYRBETRIQ) 50 MG TB24 tablet Take 1 tablet Daily for OverActive Bladder Patient taking differently: Take 50 mg by mouth daily. 08/09/21   Unk Pinto, MD  polyethylene glycol (MIRALAX / GLYCOLAX) 17 g packet Take 17 g by mouth daily as needed for mild constipation.    [provider]  potassium chloride SA (KLOR-CON) 20 MEQ tablet  TAKE 1 TABLET TWICE DAILY  (FOR POTASSIUM REPLACEMENT) Patient taking differently: Take 20 mEq by mouth 2 (two) times daily. 01/14/21   Unk Pinto, MD  pyridostigmine (MESTINON) 60 MG tablet TAKE 1 TABLET IN THE MORNING AND AT BEDTIME Patient taking differently: Take 60 mg by mouth in the morning and at bedtime. 08/05/21   Sueanne Margarita, MD  rosuvastatin (CRESTOR) 5 MG tablet TAKE 1 TABLET EVERY DAY FOR CHOLESTEROL Patient taking differently: Take 5 mg by mouth daily. 12/22/20   Liane Comber, NP  senna-docusate (GNP STOOL SOFTENER/LAXATIVE) 8.6-50 MG tablet Take 1 tablet Daily for Constipation Patient taking differently: Take 1 tablet by mouth daily. 09/25/20   Unk Pinto, MD  tamsulosin (FLOMAX) 0.4 MG CAPS capsule Take 1 capsule (0.4 mg total) by mouth daily after supper. 08/20/21   Elgergawy, Silver Huguenin, MD    Allergies    Hydrocodone-acetaminophen, Sudafed [pseudoephedrine hcl], and Tetanus toxoids  Review of Systems   Review of Systems  Unable to perform ROS: Dementia  Psychiatric/Behavioral:  Positive for confusion.    Physical Exam Updated Vital Signs BP 133/69   Pulse 72   Temp (!) 101.9 F (38.8 C) (Rectal)   Resp 19   SpO2 96%   Physical Exam Constitutional:      General: He is in acute distress.     Appearance: He is ill-appearing.  HENT:     Head: Normocephalic and atraumatic.     Nose: Nose normal.     Mouth/Throat:     Mouth: Mucous membranes are moist.  Eyes:     Extraocular Movements: Extraocular movements intact.     Pupils: Pupils are equal, round, and reactive to light.     Comments: Pupils mildly reactive and slightly pinpoint  Cardiovascular:     Pulses: Normal pulses.  Pulmonary:     Comments: Coarse breath sounds throughout Abdominal:     Tenderness: There is no abdominal tenderness.  Musculoskeletal:        General: No swelling or deformity.     Cervical back: Normal range of motion. No rigidity.  Skin:    Capillary Refill:  Capillary refill takes less than 2 seconds.  Neurological:     General: No focal deficit present.     GCS: GCS eye subscore is 3. GCS verbal subscore is 1. GCS motor subscore is 5.     Comments: Localized to painful stimuli, opens his eyes to speech, does not follow commands    ED Results / Procedures / Treatments   Labs (all labs ordered are listed, but only abnormal results are displayed) Labs Reviewed  COMPREHENSIVE METABOLIC PANEL -  Abnormal; Notable for the following components:      Result Value   Potassium 3.4 (*)    Chloride 113 (*)    Glucose, Bld 117 (*)    BUN 24 (*)    Creatinine, Ser 1.45 (*)    Calcium 7.6 (*)    Total Protein 4.4 (*)    Albumin 2.4 (*)    AST 13 (*)    GFR, Estimated 50 (*)    All other components within normal limits  CBC WITH DIFFERENTIAL/PLATELET - Abnormal; Notable for the following components:   RBC 4.09 (*)    Hemoglobin 12.5 (*)    HCT 36.9 (*)    Neutro Abs 8.8 (*)    Lymphs Abs 0.3 (*)    Abs Immature Granulocytes 0.15 (*)    All other components within normal limits  PROTIME-INR - Abnormal; Notable for the following components:   Prothrombin Time 16.8 (*)    INR 1.4 (*)    All other components within normal limits  URINALYSIS, ROUTINE W REFLEX MICROSCOPIC - Abnormal; Notable for the following components:   Color, Urine AMBER (*)    APPearance CLOUDY (*)    Hgb urine dipstick LARGE (*)    Ketones, ur 20 (*)    Protein, ur 30 (*)    Leukocytes,Ua TRACE (*)    RBC / HPF >50 (*)    All other components within normal limits  I-STAT VENOUS BLOOD GAS, ED - Abnormal; Notable for the following components:   pH, Ven 7.475 (*)    pCO2, Ven 30.3 (*)    pO2, Ven 76.0 (*)    Potassium 3.4 (*)    Calcium, Ion 1.07 (*)    HCT 36.0 (*)    Hemoglobin 12.2 (*)    All other components within normal limits  RESP PANEL BY RT-PCR (FLU A&B, COVID) ARPGX2  CULTURE, BLOOD (ROUTINE X 2)  CULTURE, BLOOD (ROUTINE X 2)  URINE CULTURE  LACTIC ACID,  PLASMA  APTT  LACTIC ACID, PLASMA  POC OCCULT BLOOD, ED    EKG EKG Interpretation  Date/Time:  Saturday August 22 2021 14:04:49 EDT Ventricular Rate:  89 PR Interval:  183 QRS Duration: 100 QT Interval:  416 QTC Calculation: 507 R Axis:   -18 Text Interpretation: Sinus rhythm Atrial premature complexes Borderline left axis deviation Prolonged QT interval Confirmed by Lennice Sites ([id number]) on 08/22/2021 2:31:27 PM  Radiology DG Chest Port 1 View  Result Date: 08/22/2021 CLINICAL DATA:  Questionable sepsis. EXAM: PORTABLE CHEST 1 VIEW COMPARISON:  August 18, 2021. FINDINGS: Patient is rotated. Aortic atherosclerosis. Mediastinal contours or stable with mildly tortuous thoracic aorta. Normal size heart. Chronic senescent lung changes. Streaky bibasilar opacities are similar to prior. No visible pleural effusion or pneumothorax. The visualized skeletal structures are unchanged including chronic left lateral fifth rib fracture. IMPRESSION: 1. No acute cardiopulmonary finding. 2.  Aortic Atherosclerosis (ICD10-I70.0). Electronically Signed   By: Dahlia Bailiff M.D.   On: 08/22/2021 15:25    Procedures .Critical Care Performed by: Lennice Sites, DO Authorized by: Lennice Sites, DO   Critical care provider statement:    Critical care time (minutes):  35   Critical care was necessary to treat or prevent imminent or life-threatening deterioration of the following conditions:  CNS failure or compromise and sepsis   Critical care was time spent personally by me on the following activities:  Blood draw for specimens, development of treatment plan with patient or surrogate, discussions with consultants, discussions with primary  provider, obtaining history from patient or surrogate, ordering and performing treatments and interventions, ordering and review of laboratory studies, ordering and review of radiographic studies, re-evaluation of patient's condition, pulse oximetry, review of old charts,  evaluation of patient's response to treatment and examination of patient   Care discussed with: admitting provider     Medications Ordered in ED Medications  vancomycin (VANCOREADY) IVPB 1500 mg/300 mL (1,500 mg Intravenous New Bag/Given 08/22/21 1537)  ceFEPIme (MAXIPIME) 2 g in sodium chloride 0.9 % 100 mL IVPB (0 g Intravenous Stopped 08/22/21 1511)  vancomycin (VANCOREADY) IVPB 1250 mg/250 mL (has no administration in time range)  sodium chloride 0.9 % bolus 1,000 mL (1,000 mLs Intravenous New Bag/Given 08/22/21 1436)  acetaminophen (TYLENOL) suppository 650 mg (650 mg Rectal Given 08/22/21 1430)    ED Course  I have reviewed the triage vital signs and the nursing notes.  Pertinent labs & imaging results that were available during my care of the patient were reviewed by me and considered in my medical decision making (see chart for details).    MDM Rules/Calculators/A&P                           [name] is here with altered mental status.  Patient with fever but otherwise normal vitals.  Admitted recently for the same.  Infectious work-up was ruled out.  However he appears to be more confused than his baseline per his family.  Sepsis work-up initiated given fever.  Hemoglobin was slightly lower than at discharge at 12 but Hemoccult is negative and stool is brown.  No significant leukocytosis or lactic acidosis.  Questionable if there is a urine infection.  Urine culture has been sent.  No signs of pneumonia or pneumothorax.  Lab work otherwise is unremarkable.  Clinically he appears very dehydrated.  Patient is DNR and DNI.  He is not at his baseline.  We will get a head CT but he has not had fall and there is no signs of external trauma.  Overall infectious work-up needs to probably be reinitiated.  Suspect that he might just be progressing from a dementia standpoint and do not think that being at home with his wife is safe anymore.  Likely needs long-term placement.  However he  may respond to antipyretics and IV fluids as well.  Patient admitted to medicine for further care.  Final Clinical Impression(s) / ED Diagnoses Final diagnoses:  SIRS (systemic inflammatory response syndrome) (HCC)  Altered mental status, unspecified altered mental status type    Rx / DC Orders ED Discharge Orders     None        Lennice Sites, DO 08/22/21 1540

## 2021-08-23 ENCOUNTER — Observation Stay (HOSPITAL_COMMUNITY): Payer: Medicare HMO

## 2021-08-23 DIAGNOSIS — N1831 Chronic kidney disease, stage 3a: Secondary | ICD-10-CM | POA: Diagnosis present

## 2021-08-23 DIAGNOSIS — Z7189 Other specified counseling: Secondary | ICD-10-CM | POA: Diagnosis not present

## 2021-08-23 DIAGNOSIS — K219 Gastro-esophageal reflux disease without esophagitis: Secondary | ICD-10-CM | POA: Diagnosis present

## 2021-08-23 DIAGNOSIS — I129 Hypertensive chronic kidney disease with stage 1 through stage 4 chronic kidney disease, or unspecified chronic kidney disease: Secondary | ICD-10-CM | POA: Diagnosis present

## 2021-08-23 DIAGNOSIS — G9341 Metabolic encephalopathy: Secondary | ICD-10-CM | POA: Diagnosis not present

## 2021-08-23 DIAGNOSIS — Z66 Do not resuscitate: Secondary | ICD-10-CM

## 2021-08-23 DIAGNOSIS — Z0000 Encounter for general adult medical examination without abnormal findings: Secondary | ICD-10-CM | POA: Diagnosis not present

## 2021-08-23 DIAGNOSIS — G20 Parkinson's disease: Secondary | ICD-10-CM | POA: Diagnosis present

## 2021-08-23 DIAGNOSIS — I951 Orthostatic hypotension: Secondary | ICD-10-CM | POA: Diagnosis present

## 2021-08-23 DIAGNOSIS — E785 Hyperlipidemia, unspecified: Secondary | ICD-10-CM | POA: Diagnosis present

## 2021-08-23 DIAGNOSIS — F0280 Dementia in other diseases classified elsewhere without behavioral disturbance: Secondary | ICD-10-CM | POA: Diagnosis present

## 2021-08-23 DIAGNOSIS — R7881 Bacteremia: Secondary | ICD-10-CM

## 2021-08-23 DIAGNOSIS — I4581 Long QT syndrome: Secondary | ICD-10-CM | POA: Diagnosis not present

## 2021-08-23 DIAGNOSIS — E559 Vitamin D deficiency, unspecified: Secondary | ICD-10-CM | POA: Diagnosis present

## 2021-08-23 DIAGNOSIS — Z515 Encounter for palliative care: Secondary | ICD-10-CM

## 2021-08-23 DIAGNOSIS — Z23 Encounter for immunization: Secondary | ICD-10-CM | POA: Diagnosis present

## 2021-08-23 DIAGNOSIS — H109 Unspecified conjunctivitis: Secondary | ICD-10-CM | POA: Diagnosis present

## 2021-08-23 DIAGNOSIS — Z20822 Contact with and (suspected) exposure to covid-19: Secondary | ICD-10-CM | POA: Diagnosis present

## 2021-08-23 DIAGNOSIS — E440 Moderate protein-calorie malnutrition: Secondary | ICD-10-CM | POA: Diagnosis present

## 2021-08-23 DIAGNOSIS — B957 Other staphylococcus as the cause of diseases classified elsewhere: Secondary | ICD-10-CM | POA: Diagnosis not present

## 2021-08-23 DIAGNOSIS — I69354 Hemiplegia and hemiparesis following cerebral infarction affecting left non-dominant side: Secondary | ICD-10-CM | POA: Diagnosis not present

## 2021-08-23 DIAGNOSIS — R4182 Altered mental status, unspecified: Secondary | ICD-10-CM | POA: Diagnosis not present

## 2021-08-23 DIAGNOSIS — Z0001 Encounter for general adult medical examination with abnormal findings: Secondary | ICD-10-CM | POA: Diagnosis not present

## 2021-08-23 DIAGNOSIS — R339 Retention of urine, unspecified: Secondary | ICD-10-CM | POA: Diagnosis present

## 2021-08-23 DIAGNOSIS — E8809 Other disorders of plasma-protein metabolism, not elsewhere classified: Secondary | ICD-10-CM | POA: Diagnosis present

## 2021-08-23 DIAGNOSIS — J309 Allergic rhinitis, unspecified: Secondary | ICD-10-CM | POA: Diagnosis present

## 2021-08-23 DIAGNOSIS — E860 Dehydration: Secondary | ICD-10-CM | POA: Diagnosis present

## 2021-08-23 DIAGNOSIS — K5900 Constipation, unspecified: Secondary | ICD-10-CM | POA: Diagnosis present

## 2021-08-23 DIAGNOSIS — I639 Cerebral infarction, unspecified: Secondary | ICD-10-CM | POA: Diagnosis not present

## 2021-08-23 DIAGNOSIS — R296 Repeated falls: Secondary | ICD-10-CM | POA: Diagnosis present

## 2021-08-23 DIAGNOSIS — F03C0 Unspecified dementia, severe, without behavioral disturbance, psychotic disturbance, mood disturbance, and anxiety: Secondary | ICD-10-CM | POA: Diagnosis not present

## 2021-08-23 DIAGNOSIS — R7303 Prediabetes: Secondary | ICD-10-CM | POA: Diagnosis present

## 2021-08-23 DIAGNOSIS — R509 Fever, unspecified: Secondary | ICD-10-CM | POA: Diagnosis present

## 2021-08-23 DIAGNOSIS — I700 Atherosclerosis of aorta: Secondary | ICD-10-CM | POA: Diagnosis present

## 2021-08-23 LAB — BLOOD CULTURE ID PANEL (REFLEXED) - BCID2

## 2021-08-23 LAB — ECHOCARDIOGRAM COMPLETE
AR max vel: 3.24 cm2
AV Area VTI: 2.8 cm2
AV Area mean vel: 3.13 cm2
AV Mean grad: 5.4 mmHg
AV Peak grad: 9.2 mmHg
Ao pk vel: 1.52 m/s
Area-P 1/2: 2.26 cm2
S' Lateral: 2.5 cm

## 2021-08-23 LAB — BASIC METABOLIC PANEL
Anion gap: 6 (ref 5–15)
BUN: 23 mg/dL (ref 8–23)
CO2: 22 mmol/L (ref 22–32)
Calcium: 8.3 mg/dL — ABNORMAL LOW (ref 8.9–10.3)
Chloride: 113 mmol/L — ABNORMAL HIGH (ref 98–111)
Creatinine, Ser: 1.28 mg/dL — ABNORMAL HIGH (ref 0.61–1.24)
GFR, Estimated: 58 mL/min — ABNORMAL LOW (ref >60)
Glucose, Bld: 91 mg/dL (ref 70–99)
Potassium: 3.6 mmol/L (ref 3.5–5.1)
Sodium: 141 mmol/L (ref 135–145)

## 2021-08-23 LAB — CBC
HCT: 37.1 % — ABNORMAL LOW (ref 39.0–52.0)
Hemoglobin: 12.5 g/dL — ABNORMAL LOW (ref 13.0–17.0)
MCH: 30.6 pg (ref 26.0–34.0)
MCHC: 33.7 g/dL (ref 30.0–36.0)
MCV: 90.7 fL (ref 80.0–100.0)
Platelets: 144 10*3/uL — ABNORMAL LOW (ref 150–400)
RBC: 4.09 MIL/uL — ABNORMAL LOW (ref 4.22–5.81)
RDW: 13.5 % (ref 11.5–15.5)
WBC: 11 10*3/uL — ABNORMAL HIGH (ref 4.0–10.5)
nRBC: 0 % (ref 0.0–0.2)

## 2021-08-23 LAB — URINE CULTURE: Culture: NO GROWTH

## 2021-08-23 MED ORDER — PYRIDOSTIGMINE BROMIDE 60 MG PO TABS
60.0000 mg | ORAL_TABLET | Freq: Two times a day (BID) | ORAL | Status: DC
  Administered 2021-08-23 – 2021-09-01 (×19): 60 mg via ORAL
  Filled 2021-08-23 (×21): qty 1

## 2021-08-23 MED ORDER — POLYETHYLENE GLYCOL 3350 17 G PO PACK
17.0000 g | PACK | Freq: Every day | ORAL | Status: DC
  Administered 2021-08-23 – 2021-09-01 (×8): 17 g via ORAL
  Filled 2021-08-23 (×10): qty 1

## 2021-08-23 MED ORDER — SENNA 8.6 MG PO TABS
1.0000 | ORAL_TABLET | Freq: Every day | ORAL | Status: DC
  Administered 2021-08-23 – 2021-09-01 (×9): 8.6 mg via ORAL
  Filled 2021-08-23 (×10): qty 1

## 2021-08-23 MED ORDER — ROSUVASTATIN CALCIUM 5 MG PO TABS
5.0000 mg | ORAL_TABLET | Freq: Every day | ORAL | Status: DC
  Administered 2021-08-23 – 2021-09-01 (×10): 5 mg via ORAL
  Filled 2021-08-23 (×10): qty 1

## 2021-08-23 MED ORDER — ASPIRIN EC 81 MG PO TBEC
81.0000 mg | DELAYED_RELEASE_TABLET | Freq: Every day | ORAL | Status: DC
  Administered 2021-08-23 – 2021-08-31 (×9): 81 mg via ORAL
  Filled 2021-08-23 (×10): qty 1

## 2021-08-23 MED ORDER — MELATONIN 5 MG PO TABS
5.0000 mg | ORAL_TABLET | Freq: Every evening | ORAL | Status: DC | PRN
  Administered 2021-08-23 – 2021-08-31 (×6): 5 mg via ORAL
  Filled 2021-08-23 (×7): qty 1

## 2021-08-23 NOTE — Consult Note (Signed)
Palliative Medicine Inpatient Consult Note  Consulting Provider: Karmen Bongo, MD  Reason for consult:    Answer  Palliative Care Consult Services Palliative Medicine Consult  Reason for Consult? goals of care    HPI:  Per intake H&P --> Mar Walmer. is a 75 y.o. male with medical history significant of HTN; HLD; stage 3a CKD; advanced dementia; dysautonomia with postural hypotension; h/o CVA; and urinary retention with foley catheter placement presenting with sepsis.  Palliative care was asked to get involved to further discuss goals of care.   On note, Palliative care last saw Indy on 07/18/2020 when he was admitted for right sided weakness and was found to have suffered a stroke.   Clinical Assessment/Goals of Care:  *Please note that this is a verbal dictation therefore any spelling or grammatical errors are due to the "[place]" system interpretation.  I have reviewed medical records including EPIC notes, labs and imaging, received report from bedside RN, assessed the patient who is resting in bed in NAD. He continuously shared with me that he was thirsty.     I met with Butler and Josede Cicero (spouse) to further discuss diagnosis prognosis, GOC, EOL wishes, disposition and options.  We reviewed Ollen's past medical illnesses including recent hospital stay for sepsis. Discussed his history of CKD, CVA, and advancing dementia.    I introduced Palliative Medicine as specialized medical care for people living with serious illness. It focuses on providing relief from the symptoms and stress of a serious illness. The goal is to improve quality of life for both the patient and the family.  Wai is from [place]. [place] originally. He has been married to Sperry for the past fifty-two years. They share two children and four grandchildren. 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 He enjoyed fishing in his younger years. He is a man of faith and practices within the  [facility] denomination.   Wife shares that her husband has had dementia for many years. Baseline prior to admit --> His wife reports that normally he could transfer to the bathroom with a walker and help.  He calls her when she leaves his sight.  He has been able to feed himself until the last week but recently it has been easier for her to feed him.  He usually is able to communicate needs efficiently.  A detailed discussion was had today regarding advanced directives, patient does have a living will though we do not have a copy of this presently.  Patients wife reports that she will bring in a copy of this.   Concepts specific to code status, artifical feeding and hydration, continued IV antibiotics and rehospitalization was had.  Patient is a DNAR/DNI code status.   Reviewed that patient dementia has been worsening. Reviewed clinical signs of worsening dementia inclusive of decreased function, decreased nutrition, perseveration's, inability to remain continent.   The difference between a aggressive medical intervention path  and a palliative comfort care path for this patient at this time was had. He had a hospice evaluation six months ago though at that time he was not ready for their services.  Presently hospice is a consideration.  I described hospice as a service for patients who have a life expectancy of < 6 months. It preserves dignity and quality at the end phases of life. The focus changes from curative to symptom relief.   Reviewed that the first step would be to optimize Harol's strength so that Bethena Roys  could remains as hi primary caregiver during his final phases of life.   Discussed the importance of continued conversation with family and their  medical providers regarding overall plan of care and treatment options, ensuring decisions are within the context of the patients values and GOCs.  Decision Maker: Dillyn Menna (spouse) [phone]  SUMMARY OF RECOMMENDATIONS   DNAR/DNI  PT  Eval pending - Patients spouse is interested in SNF. Per discussion with PT plan to wrap legs to decrease orthostasis  Speech therapy - Appreciate diet recommendations  Appreciate TOC referral to liaison for hospice conversations ideally patient would be optimized from a strength perspective and transition to home with hospice after SNF stay  Ongoing incremental PMT support  Code Status/Advance Care Planning: DNAR/DNI    Palliative Prophylaxis:  Oral Care, Mobility  Additional Recommendations (Limitations, Scope, Preferences): Continue current scope of care   Psycho-social/Spiritual:  Desire for further Chaplaincy support: Yes Additional Recommendations: Education on dementia progression   Prognosis: (+) 2 admissions in six months, declining functional state, hypoalbuminemia 2.4, increased frailty index. Very high 12 month mortality risk.   Discharge Planning: Discharge to SNF when medically optimized  Vitals:   08/23/21 0020 08/23/21 0702  BP: (!) 150/74 (!) 192/89  Pulse: (!) 55 (!) 53  Resp:    Temp: 98.4 F (36.9 C) 98 F (36.7 C)  SpO2: 98% 100%    Intake/Output Summary (Last 24 hours) at 08/23/2021 0758 Last data filed at 08/23/2021 6283 Gross per 24 hour  Intake 2206.77 ml  Output 1275 ml  Net 931.77 ml   Gen:  Frail elderly M in NAD HEENT: moist mucous membranes CV: Regular rate and rhythm  PULM:  On RA ABD: soft/nontender  EXT: No edema  Neuro: Alert and oriented x2 - perseverant  PPS: 30-40%   This conversation/these recommendations were discussed with patient primary care team, Dr. Tyrell Antonio  Time In: 0830 Time Out: 0940 Total Time: 70 Greater than 50%  of this time was spent counseling and coordinating care related to the above assessment and plan.  [place] Team Team Cell Phone: [phone] Please utilize secure chat with additional questions, if there is no response within 30 minutes please call the  above phone number  Palliative Medicine Team providers are available by phone from 7am to 7pm daily and can be reached through the team cell phone.  Should this patient require assistance outside of these hours, please call the patient's attending physician.

## 2021-08-23 NOTE — Progress Notes (Signed)
SLP Cancellation Note  Patient Details Name: Mathew Miller. MRN: [id number] DOB: 04/02/1946   Cancelled treatment:       Reason Eval/Treat Not Completed: Other (comment) Speech/Language Evaluation not completed this date d/t minimal participation. Wife reports baseline speech/language/cognitive deficits, but that they are worsened during hospitalization. He is very slow to respond if he responds at all. He requires cues from wife for participation/verbalization.  ST service will re-attempt as able.  Caoilainn Sacks P. Doron Shake, M.S., CCC-SLP Speech-Language Pathologist Acute Rehabilitation Services Pager: [place] 08/23/2021, 12:30 PM

## 2021-08-23 NOTE — Progress Notes (Signed)
PHARMACY - PHYSICIAN COMMUNICATION CRITICAL VALUE ALERT - BLOOD CULTURE IDENTIFICATION (BCID)  Mathew Miller. is an 75 y.o. male who presented to [facility] on [facility]/22/[facility]21 with a chief complaint of fever/AMS of unknown origin  Assessment:  2 out of 6 (anaerobic) blood cultures + for Staph epi with mec a Suspected source is PNA vs UTI Unclear if these bottles were contaminated as previously cultures are NGTD Consider continuing current therapy until able to distinguish a source  Name of physician (or Provider) Contacted: Dr. Tyrell Antonio  Current antibiotics: Vanoc/cefepime  Changes to prescribed antibiotics recommended:  Patient is on recommended antibiotics - No changes needed  Thank you for allowing pharmacy to be a part of this patient's care.  Donnald Garre, PharmD Clinical Pharmacist  Please check AMION for all [place] numbers After 10:00 PM, call [place] [phone]

## 2021-08-23 NOTE — Evaluation (Signed)
Clinical/Bedside Swallow Evaluation Patient Details  Name: Mathew Miller. MRN: [id number] Date of Birth: 1946-05-11  Today's Date: 08/23/2021 Time: SLP Start Time (ACUTE ONLY): 1100 SLP Stop Time (ACUTE ONLY): 1120 SLP Time Calculation (min) (ACUTE ONLY): 20 min  Past Medical History:  Past Medical History:  Diagnosis Date   Constipation    Hyperlipidemia    Hypertension    Prediabetes    no meds    Vitamin D deficiency    Past Surgical History:  Past Surgical History:  Procedure Laterality Date   CATARACT EXTRACTION, BILATERAL  2017   COLONOSCOPY     NO PAST SURGERIES     HPI:  Mathew Miller. is a 75 y.o. male presenting with sepsis.  He was previously admitted from 11/1-3 with the same, started on antibiotics, but no source was found and he was discharged with indwelling foley.   Morning of this admission, he had a fever to 105.7; His wife reports that normally he can transfer to the bathroom with a walker and help.  He calls her when she leaves his sight.  He has been able to feed himself until the week leading up to last admission, but recently it has been easier for her to feed him.  He had a stroke a year ago and has had limited LUE strength;He usually is able to communicate but the AM of admission he did not speak at all. with medical history significant of HTN; HLD; stage 3a CKD; advanced dementia; dysautonomia with postural hypotension; h/o CVA; and urinary retention with foley catheter placement    Assessment / Plan / Recommendation  Clinical Impression  Pt sitting upright in chair upon SLP arrival. He is kyphotic and very slow to respond. Wife present to aid as historian. They both deny any history of swallowing difficulty. He was anxious to eat something today. Oral mech exam revealed generalized weakness throughout. He has low vocal intensity, c/w Parkinson's disease. He had no s/s aspiration or dysphagia with any consistency trialed. CXR is WNL with no respiratory  complaints or history. He is recommended to begin a regular consistency diet with thin liquids with meds as tolerated. Given delayed processing, he may benefit from meds in puree.   Speech/language evaluation was ordered and attempted, but pt was minimally verbal/participatory. Wife endorses baseline cognitive deficits and confirms his diagnosis of dementia. She does feel he is mildly worsened at this time, as he is usually more verbal/communicative. Pt may benefit from a repeated attempt at SLE at a later date.  SLP Visit Diagnosis: Dysphagia, unspecified (R13.10)    Aspiration Risk  Mild aspiration risk    Diet Recommendation Regular;Thin liquid   Liquid Administration via: Straw Medication Administration: Whole meds with puree Compensations: Small sips/bites Postural Changes: Seated upright at 90 degrees    Other  Recommendations Oral Care Recommendations: Oral care BID    Recommendations for follow up therapy are one component of a multi-disciplinary discharge planning process, led by the attending physician.  Recommendations may be updated based on patient status, additional functional criteria and insurance authorization.  Follow up Recommendations None      Frequency and Duration   Pending SLE         Prognosis   Good     Swallow Study   General Date of Onset: 08/23/21 HPI: Mathew Miller. is a 75 y.o. male presenting with sepsis.  He was previously admitted from 11/1-3 with the same, started on antibiotics, but no  source was found and he was discharged with indwelling foley.   Morning of this admission, he had a fever to 105.7; His wife reports that normally he can transfer to the bathroom with a walker and help.  He calls her when she leaves his sight.  He has been able to feed himself until the week leading up to last admission, but recently it has been easier for her to feed him.  He had a stroke a year ago and has had limited LUE strength;He usually is able to  communicate but the AM of admission he did not speak at all. with medical history significant of HTN; HLD; stage 3a CKD; advanced dementia; dysautonomia with postural hypotension; h/o CVA; and urinary retention with foley catheter placement Type of Study: Bedside Swallow Evaluation Previous Swallow Assessment: none Diet Prior to this Study: NPO Temperature Spikes Noted: No Respiratory Status: Room air History of Recent Intubation: No Behavior/Cognition: Alert;Cooperative;Pleasant mood Oral Cavity Assessment: Within Functional Limits Oral Care Completed by SLP: No Oral Cavity - Dentition: Adequate natural dentition Vision: Functional for self-feeding Self-Feeding Abilities: Needs assist Patient Positioning: Upright in chair Baseline Vocal Quality: Low vocal intensity Volitional Cough: Cognitively unable to elicit Volitional Swallow: Unable to elicit    Oral/Motor/Sensory Function Overall Oral Motor/Sensory Function: Generalized oral weakness   Ice Chips Ice chips: Within functional limits Presentation: Spoon   Thin Liquid Thin Liquid: Within functional limits Presentation: Straw;Cup    Puree Puree: Within functional limits   Solid     Solid: Within functional limits Presentation: Spoon     Mathew Miller, M.S., CCC-SLP Speech-Language Pathologist Acute Rehabilitation Services Pager: [phone]  Mathew Miller 08/23/2021,12:22 PM

## 2021-08-23 NOTE — Progress Notes (Addendum)
  Update- CSW received callback from patients spouse who would like to discuss plan with patient. Patients spouse request for CSW to call her back tomorrow. CSW will follow up with patients spouse tomorrow.  CSW acknowledges PT recommendation. Patient currently only oriented to self . CSW called patients spouse and left voicemail to discuss PT recommendation for patient. CSW awaiting callback. CSW will continue to follow and assist with patients dc planning needs.

## 2021-08-23 NOTE — Progress Notes (Signed)
SLP Cancellation Note  Patient Details Name: Mathew Miller. MRN: [id number] DOB: Feb 16, 1946   Cancelled treatment:       Reason Eval/Treat Not Completed: Fatigue/lethargy limiting ability to participate  Pt/wife both sleeping. Will return later this date for swallow evaluation and cognitive evaluation as time allows.  Stephan Nelis P. Junious Ragone, M.S., [place] Speech-Language Pathologist Acute Rehabilitation Services Pager: [place] 08/23/2021, 8:34 AM

## 2021-08-23 NOTE — Evaluation (Signed)
Occupational Therapy Evaluation Patient Details Name: Mathew Miller. MRN: [id number] DOB: 02-10-1946 Today's Date: 08/23/2021   History of Present Illness Mathew Miller. is a 75 y.o. male presenting with sepsis.  He was previously admitted from 11/1-3 with the same, started on antibiotics, but no source was found and he was discharged with indwelling foley.   Morning of this admission, he had a fever to 105.7; His wife reports that normally he can transfer to the bathroom with a walker and help.  He calls her when she leaves his sight.  He has been able to feed himself until the week leading up to last admission, but recently it has been easier for her to feed him.  He had a stroke a year ago and has had limited LUE strength;He usually is able to communicate but this AM of admission he did not speak at all. with medical history significant of HTN; HLD; stage 3a CKD; advanced dementia; dysautonomia with postural hypotension; h/o CVA; and urinary retention with foley catheter placement   Clinical Impression   Pt presents with decreased cognition, strength, and activity tolerance. Pt currently requiring Max A - Total A with ADLs based on presentation. Pt did not provide much volitional movement during eval, despite max verbal and physical cues. Pt did respond to basic questions, however required extended time for processing. Noted deficits in BUEs, though difficult to assess (see details below). Per wife, pt was requiring Max A at bed level for most ADLs at home. Pt lives with wife who will be available to provide 24 hour assistance. Pt would likely benefit from SNF for further rehab to improve safety/independence with ADLs and functional/transfers/mobility prior to return home. Will follow acutely.     Recommendations for follow up therapy are one component of a multi-disciplinary discharge planning process, led by the attending physician.  Recommendations may be updated based on patient status,  additional functional criteria and insurance authorization.   Follow Up Recommendations  Skilled nursing-short term rehab (<3 hours/day)    Assistance Recommended at Discharge Frequent or constant Supervision/Assistance  Functional Status Assessment  Patient has had a recent decline in their functional status and/or demonstrates limited ability to make significant improvements in function in a reasonable and predictable amount of time  Equipment Recommendations  None recommended by OT    Recommendations for Other Services       Precautions / Restrictions Precautions Precautions: Fall Precaution Comments: Watch for signs of presyncope/syncope; history of orthostatic hypotension Restrictions Weight Bearing Restrictions: No      Mobility Bed Mobility Overal bed mobility: Needs Assistance Bed Mobility: Rolling;Sidelying to Sit Rolling: Max assist Sidelying to sit: Max assist;+2 for physical assistance       General bed mobility comments: Attempt supine to sit transfer, however pt not participating and likely requiring Max - Total A +2 at this time.    Transfers       Balance                                      ADL either performed or assessed with clinical judgement   ADL Overall ADL's : Needs assistance/impaired                                       General ADL Comments: Likely Max -  Total A with ADLs at this time based on presentation due to cognitive and physical deficits (see above). Was requiring Max A at bed level PTA.     Vision         Perception     Praxis      Pertinent Vitals/Pain Pain Assessment: Faces Faces Pain Scale: Hurts a little bit Pain Location: B shoulders with flexion Pain Descriptors / Indicators: Grimacing Pain Intervention(s): Limited activity within patient's tolerance;Monitored during session     Hand Dominance Right   Extremity/Trunk Assessment Upper Extremity Assessment Upper Extremity  Assessment: Difficult to assess due to impaired cognition;LUE deficits/detail;RUE deficits/detail RUE Deficits / Details: Painful with flexion >90*. Swelling noted in R hand. Limited volitional movement despite verbal and physical cues. LUE Deficits / Details: Deficits present from prior stroke. Reports pain with any shoulder flexion. Digits resting in flexed position though easily extended passively. Wife reports he does not move LUE often. May benefit from resting hand splint.   Lower Extremity Assessment Lower Extremity Assessment: Defer to PT evaluation   Cervical / Trunk Assessment Cervical / Trunk Assessment: Kyphotic   Communication Communication Communication: HOH   Cognition Arousal/Alertness: Awake/alert Behavior During Therapy: Flat affect Overall Cognitive Status: Impaired/Different from baseline Area of Impairment: Orientation;Attention;Memory;Following commands;Safety/judgement                               General Comments: Oriented x1 to person. Slow processing and inconsistently answers yes/no questions. Perservates on multiple tangential topics. When asked to complete tasks he says he will, however does not initiate or meaningfully participate despite verbal and physical cues.     General Comments      Exercises     Shoulder Instructions      Home Living Family/patient expects to be discharged to:: Private residence Living Arrangements: Spouse/significant other Available Help at Discharge: Family;Available 24 hours/day Type of Home: House Home Access: Ramped entrance Entrance Stairs-Number of Steps: 6   Home Layout: Multi-level Alternate Level Stairs-Number of Steps: 5-6 Alternate Level Stairs-Rails: Right;Left;Can reach both Bathroom Shower/Tub: Producer, television/film/video: Handicapped height Bathroom Accessibility: Yes   Home Equipment: Agricultural consultant (2 wheels);Wheelchair - manual;Shower seat;Hospital bed;Grab bars - toilet           Prior Functioning/Environment Prior Level of Function : Needs assist  Cognitive Assist : Mobility (cognitive);ADLs (cognitive) Mobility (Cognitive): Intermittent cues ADLs (Cognitive): Intermittent cues Physical Assist : Mobility (physical);ADLs (physical) Mobility (physical): Bed mobility;Transfers ADLs (physical): Bathing;Dressing;Toileting;IADLs Mobility Comments: Spouse states patient continues to perform transfers to [facility] with assist and RW ADLs Comments: Max A with bed level ADLs.        OT Problem List: Decreased strength;Decreased range of motion;Decreased activity tolerance;Impaired balance (sitting and/or standing);Decreased safety awareness;Decreased cognition      OT Treatment/Interventions: Self-care/ADL training;Therapeutic exercise;Balance training;Therapeutic activities;DME and/or AE instruction    OT Goals(Current goals can be found in the care plan section) Acute Rehab OT Goals Patient Stated Goal: unable to state OT Goal Formulation: With patient/family Time For Goal Achievement: 09/06/21 Potential to Achieve Goals: Fair  OT Frequency: Min 2X/week   Barriers to D/C:            Co-evaluation              AM-PAC OT "6 Clicks" Daily Activity     Outcome Measure Help from another person eating meals?: A Lot Help from another person taking care of personal  grooming?: A Lot Help from another person toileting, which includes using toliet, bedpan, or urinal?: Total Help from another person bathing (including washing, rinsing, drying)?: Total Help from another person to put on and taking off regular upper body clothing?: Total Help from another person to put on and taking off regular lower body clothing?: Total 6 Click Score: 8   End of Session Nurse Communication: Mobility status  Activity Tolerance: Other (comment) (Limited by cognition) Patient left: in bed;with call bell/phone within reach;with family/visitor present  OT Visit Diagnosis: History  of falling (Z91.81);Muscle weakness (generalized) (M62.81);Adult, failure to thrive (R62.7);Other symptoms and signs involving cognitive function                Time: 1610-9604 OT Time Calculation (min): 23 min Charges:  OT General Charges $OT Visit: 1 Visit OT Evaluation $OT Eval Moderate Complexity: 1 Mod  Antuan Limes C, OT/L  Acute Rehab [phone]  Lenice Llamas 08/23/2021, 2:48 PM

## 2021-08-23 NOTE — Progress Notes (Signed)
PROGRESS NOTE    Mathew Miller.  VQM:[id number] DOB: 1945/11/23 DOA: 08/22/2021 PCP: Unk Pinto, MD   Brief Narrative: 75 year old with past medical history significant for hypertension, HLD, stage IIIa CKD, advanced dementia, dysautonomia with postural hypotension, history of CVA, urinary retention with Foley catheter placement, presents with sepsis.  Recently admitted 11/1 on 11/3 for the same, started on antibiotics, no source was found he was discharged with indwelling Foley.  Patient presented with a recurrent fever, temp 105.  At baseline patient normally can transfer to the bathroom with a walker and help.  Recently she has been feeding him.  He is able to communicate with the morning of admission he was not speaking at all.  Patient presents with altered mental status, recurrent fever.  Assessment & Plan:   Principal Problem:   Fever Active Problems:   Hyperlipidemia   Goals of care, counseling/discussion   CVA (cerebral vascular accident) ([place])   Dementia ([place])   Prolonged QT interval   Acute metabolic encephalopathy  1-Fever: Staph Epi Bacteremia.  Chest x-ray 11/5: No acute cardiopulmonary finding. COVID-19: Negative UA: More than 50 red blood cell, white blood cell 21-50 Urine culture: No growth to date.  Blood cultures: 2/2 growing staph epi  ID consulted. Recommend TEE>  CT chest Ordered.  Continue with Vancomycin.  Check ESR>   Acute metabolic encephalopathy;  Suspect related to fever.  Repeated CT head no acute intracranial abnormality.  He is more alert, and saying few words today.  On IV fluids for hydration.    Stage III a CKD: Cr range 1.4.   Advanced dementia: Palliative consulted and following.  DNR status.   Urinary retention: Continue with foley catheter.   History of CVA: Resume aspirin   Prolonged QT: Hold donepezil/  Repeat EKG tomorrow.   Hypokalemia; replaced.    Recent CT renal; showed massive stool ball. Per wife  patient has had BM since discharge.  Plan to schedule laxative.     Estimated body mass index is 20.54 kg/m as calculated from the following:   Height as of 08/10/21: $RemoveBefo'6\' 2"'fgFjgrofkjE$  (1.88 m).   Weight as of 08/10/21: 72.6 kg.   DVT prophylaxis: Lovenox Code Status: DNR Family Communication: Wife who was at bedside.  Disposition Plan:  Status is: Observation  The patient remains OBS appropriate and will d/c before 2 midnights.       Consultants:  ID  Procedures:  ECHO pending.   Antimicrobials:    Subjective: He is alert, he was sitting recliner with  assistance of PT> he said hi, denies pain.   Objective: Vitals:   08/22/21 1850 08/22/21 1900 08/23/21 0020 08/23/21 0702  BP: 134/75  (!) 150/74 (!) 192/89  Pulse: (!) 56 (!) 56 (!) 55 (!) 53  Resp: 18     Temp:   98.4 F (36.9 C) 98 F (36.7 C)  TempSrc:   Oral Oral  SpO2: 99%  98% 100%    Intake/Output Summary (Last 24 hours) at 08/23/2021 0757 Last data filed at 08/23/2021 9326 Gross per 24 hour  Intake 2206.77 ml  Output 1275 ml  Net 931.77 ml   There were no vitals filed for this visit.  Examination:  General exam: Appears calm and comfortable  Respiratory system: Clear to auscultation. Respiratory effort normal. Cardiovascular system: S1 & S2 heard, RRR.  Gastrointestinal system: Abdomen is nondistended, soft and nontender. No organomegaly or masses felt. Normal bowel sounds heard. Central nervous system: Alert said few words. Not  very interactive.  Extremities: no edema   Data Reviewed: I have personally reviewed following labs and imaging studies  CBC: Recent Labs  Lab 08/18/21 1107 08/19/21 0300 08/22/21 1358 08/22/21 1425  WBC 18.3* 13.9* 9.5  --   NEUTROABS 16.6*  --  8.8*  --   HGB 15.4 15.2 12.5* 12.2*  HCT 46.1 48.0 36.9* 36.0*  MCV 92.2 94.3 90.2  --   PLT 186 179 150  --    Basic Metabolic Panel: Recent Labs  Lab 08/18/21 1107 08/19/21 0300 08/22/21 1358 08/22/21 1425  NA  139 141 143 144  K 4.5 4.2 3.4* 3.4*  CL 107 107 113*  --   CO2 21* 23 22  --   GLUCOSE 153* 136* 117*  --   BUN 31* 31* 24*  --   CREATININE 1.67* 1.47* 1.45*  --   CALCIUM 9.2 9.4 7.6*  --    GFR: Estimated Creatinine Clearance: 43.8 mL/min (A) (by C-G formula based on SCr of 1.45 mg/dL (H)). Liver Function Tests: Recent Labs  Lab 08/18/21 1107 08/22/21 1358  AST 22 13*  ALT 12 11  ALKPHOS 82 59  BILITOT 1.4* 0.9  PROT 5.9* 4.4*  ALBUMIN 3.5 2.4*   No results for input(s): LIPASE, AMYLASE in the last 168 hours. No results for input(s): AMMONIA in the last 168 hours. Coagulation Profile: Recent Labs  Lab 08/19/21 0020 08/22/21 1358  INR 1.1 1.4*   Cardiac Enzymes: No results for input(s): CKTOTAL, CKMB, CKMBINDEX, TROPONINI in the last 168 hours. BNP (last 3 results) No results for input(s): PROBNP in the last 8760 hours. HbA1C: No results for input(s): HGBA1C in the last 72 hours. CBG: No results for input(s): GLUCAP in the last 168 hours. Lipid Profile: No results for input(s): CHOL, HDL, LDLCALC, TRIG, CHOLHDL, LDLDIRECT in the last 72 hours. Thyroid Function Tests: No results for input(s): TSH, T4TOTAL, FREET4, T3FREE, THYROIDAB in the last 72 hours. Anemia Panel: No results for input(s): VITAMINB12, FOLATE, FERRITIN, TIBC, IRON, RETICCTPCT in the last 72 hours. Sepsis Labs: Recent Labs  Lab 08/18/21 1107 08/19/21 0020 08/22/21 1410 08/22/21 1558  LATICACIDVEN 2.0* 2.4* 1.1 1.3    Recent Results (from the past 240 hour(s))  Urine Culture     Status: Abnormal   Collection Time: 08/18/21 11:54 PM   Specimen: In/Out Cath Urine  Result Value Ref Range Status   Specimen Description IN/OUT CATH URINE  Final   Special Requests   Final    Normal Performed at [facility], [address] [place], [place] [address]    Culture MULTIPLE SPECIES PRESENT, SUGGEST RECOLLECTION (A)  Final   Report Status 08/20/2021 FINAL  Final  Blood culture (routine  single)     Status: None (Preliminary result)   Collection Time: 08/19/21 12:20 AM   Specimen: BLOOD  Result Value Ref Range Status   Specimen Description BLOOD RIGHT ANTECUBITAL  Final   Special Requests   Final    BOTTLES DRAWN AEROBIC AND ANAEROBIC Blood Culture results may not be optimal due to an inadequate volume of blood received in culture bottles   Culture   Final    NO GROWTH 3 DAYS Performed at [facility], [place] [address], [place], [place] [address]    Report Status PENDING  Incomplete  Resp Panel by RT-PCR (Flu A&B, Covid) Nasopharyngeal Swab     Status: None   Collection Time: 08/19/21  5:03 AM   Specimen: Nasopharyngeal Swab; Nasopharyngeal(NP)  swabs in vial transport medium  Result Value Ref Range Status   SARS Coronavirus 2 by RT PCR NEGATIVE NEGATIVE Final    Comment: (NOTE) SARS-CoV-2 target nucleic acids are NOT DETECTED.  The SARS-CoV-2 RNA is generally detectable in upper respiratory specimens during the acute phase of infection. The lowest concentration of SARS-CoV-2 viral copies this assay can detect is 138 copies/mL. A negative result does not preclude SARS-Cov-2 infection and should not be used as the sole basis for treatment or other patient management decisions. A negative result may occur with  improper specimen collection/handling, submission of specimen other than nasopharyngeal swab, presence of viral mutation(s) within the areas targeted by this assay, and inadequate number of viral copies(<138 copies/mL). A negative result must be combined with clinical observations, patient history, and epidemiological information. The expected result is Negative.  Fact Sheet for Patients:  EntrepreneurPulse.com.au  Fact Sheet for Healthcare Providers:  IncredibleEmployment.be  This test is no t yet approved or cleared by the [place] FDA and  has been authorized for detection and/or diagnosis of SARS-CoV-2  by FDA under an Emergency Use Authorization (EUA). This EUA will remain  in effect (meaning this test can be used) for the duration of the COVID-19 declaration under Section 564(b)(1) of the Act, 21 U.S.C.section 360bbb-3(b)(1), unless the authorization is terminated  or revoked sooner.       Influenza A by PCR NEGATIVE NEGATIVE Final   Influenza B by PCR NEGATIVE NEGATIVE Final    Comment: (NOTE) The Xpert Xpress SARS-CoV-2/FLU/RSV plus assay is intended as an aid in the diagnosis of influenza from Nasopharyngeal swab specimens and should not be used as a sole basis for treatment. Nasal washings and aspirates are unacceptable for Xpert Xpress SARS-CoV-2/FLU/RSV testing.  Fact Sheet for Patients: EntrepreneurPulse.com.au  Fact Sheet for Healthcare Providers: IncredibleEmployment.be  This test is not yet approved or cleared by the [place] FDA and has been authorized for detection and/or diagnosis of SARS-CoV-2 by FDA under an Emergency Use Authorization (EUA). This EUA will remain in effect (meaning this test can be used) for the duration of the COVID-19 declaration under Section 564(b)(1) of the Act, 21 U.S.C. section 360bbb-3(b)(1), unless the authorization is terminated or revoked.  Performed at [facility], [place] [address], [place], [place] [address]   Resp Panel by RT-PCR (Flu A&B, Covid) Nasopharyngeal Swab     Status: None   Collection Time: 08/22/21  2:08 PM   Specimen: Nasopharyngeal Swab; Nasopharyngeal(NP) swabs in vial transport medium  Result Value Ref Range Status   SARS Coronavirus 2 by RT PCR NEGATIVE NEGATIVE Final    Comment: (NOTE) SARS-CoV-2 target nucleic acids are NOT DETECTED.  The SARS-CoV-2 RNA is generally detectable in upper respiratory specimens during the acute phase of infection. The lowest concentration of SARS-CoV-2 viral copies this assay can detect is 138 copies/mL. A negative result does not  preclude SARS-Cov-2 infection and should not be used as the sole basis for treatment or other patient management decisions. A negative result may occur with  improper specimen collection/handling, submission of specimen other than nasopharyngeal swab, presence of viral mutation(s) within the areas targeted by this assay, and inadequate number of viral copies(<138 copies/mL). A negative result must be combined with clinical observations, patient history, and epidemiological information. The expected result is Negative.  Fact Sheet for Patients:  EntrepreneurPulse.com.au  Fact Sheet for Healthcare Providers:  IncredibleEmployment.be  This test is no t yet approved or cleared by the [place] FDA  and  has been authorized for detection and/or diagnosis of SARS-CoV-2 by FDA under an Emergency Use Authorization (EUA). This EUA will remain  in effect (meaning this test can be used) for the duration of the COVID-19 declaration under Section 564(b)(1) of the Act, 21 U.S.C.section 360bbb-3(b)(1), unless the authorization is terminated  or revoked sooner.       Influenza A by PCR NEGATIVE NEGATIVE Final   Influenza B by PCR NEGATIVE NEGATIVE Final    Comment: (NOTE) The Xpert Xpress SARS-CoV-2/FLU/RSV plus assay is intended as an aid in the diagnosis of influenza from Nasopharyngeal swab specimens and should not be used as a sole basis for treatment. Nasal washings and aspirates are unacceptable for Xpert Xpress SARS-CoV-2/FLU/RSV testing.  Fact Sheet for Patients: EntrepreneurPulse.com.au  Fact Sheet for Healthcare Providers: IncredibleEmployment.be  This test is not yet approved or cleared by the [place] FDA and has been authorized for detection and/or diagnosis of SARS-CoV-2 by FDA under an Emergency Use Authorization (EUA). This EUA will remain in effect (meaning this test can be used) for the  duration of the COVID-19 declaration under Section 564(b)(1) of the Act, 21 U.S.C. section 360bbb-3(b)(1), unless the authorization is terminated or revoked.  Performed at [facility], [place] [address], [place], [place] [address]          Radiology Studies: CT HEAD WO CONTRAST (5MM)  Result Date: 08/22/2021 CLINICAL DATA:  Altered mental status. EXAM: CT HEAD WITHOUT CONTRAST TECHNIQUE: Contiguous axial images were obtained from the base of the skull through the vertex without intravenous contrast. COMPARISON:  August 19, 2021 FINDINGS: Brain: No evidence of acute infarction, hemorrhage, hydrocephalus, extra-axial collection or mass lesion/mass effect. Marked brain parenchymal volume loss and deep white matter microangiopathy. Vascular: No hyperdense vessel or unexpected calcification. Skull: Normal. Negative for fracture or focal lesion. Sinuses/Orbits: No acute finding. Other: None. IMPRESSION: 1. No acute intracranial abnormality. 2. Marked brain parenchymal atrophy and chronic microvascular disease. Electronically Signed   By: Fidela Salisbury M.D.   On: 08/22/2021 16:36   DG Chest Port 1 View  Result Date: 08/22/2021 CLINICAL DATA:  Questionable sepsis. EXAM: PORTABLE CHEST 1 VIEW COMPARISON:  August 18, 2021. FINDINGS: Patient is rotated. Aortic atherosclerosis. Mediastinal contours or stable with mildly tortuous thoracic aorta. Normal size heart. Chronic senescent lung changes. Streaky bibasilar opacities are similar to prior. No visible pleural effusion or pneumothorax. The visualized skeletal structures are unchanged including chronic left lateral fifth rib fracture. IMPRESSION: 1. No acute cardiopulmonary finding. 2.  Aortic Atherosclerosis (ICD10-I70.0). Electronically Signed   By: Dahlia Bailiff M.D.   On: 08/22/2021 15:25        Scheduled Meds:  enoxaparin (LOVENOX) injection  40 mg Subcutaneous Q24H   sodium chloride flush  3 mL Intravenous Q12H   Continuous  Infusions:  ceFEPime (MAXIPIME) IV Stopped (08/22/21 2112)   lactated ringers 75 mL/hr at 08/22/21 1832   vancomycin       LOS: 0 days    Time spent: 35 minutes.     Elmarie Shiley, MD [facility]   If 7PM-7AM, please contact night-coverage [email or web address]  08/23/2021, 7:57 AM

## 2021-08-23 NOTE — Consult Note (Addendum)
[facility] for Infectious Disease  Total days of antibiotics 2               Reason for Consult: MRSE bacteremia   Referring Physician: regalado  Principal Problem:   Fever Active Problems:   Hyperlipidemia   Goals of care, counseling/discussion   CVA (cerebral vascular accident) ([place])   Dementia ([place])   Prolonged QT interval   Acute metabolic encephalopathy    HPI: Mathew Miller. is a 75 y.o. male with advanced dementia, dysautonomia with postural hypotension, h/o CVA, urinary retention with foley catheter placement on recent hospitalization on 11/1-11/3. He was readmitted roughly 24hrs from discharge with worsening altered mental status nad high fever of 103F. His repeat blood cx showed 3 of 4 bottles with MRSE on 11/5 ( previously negative blood cx on 11/2). He was empirically started on vancomycin and cefepime. Chest cxr not suggestive of PNA.NCHCT showing cerebral atrophy. UA on 11/5 showing no growth. He has responded to ivf, abtx initiation states his wife who is feeding him at bedside. Patient denies abdominal pain, no longer having fevers, for the limited questions he can answer.   Past Medical History:  Diagnosis Date   Constipation    Hyperlipidemia    Hypertension    Prediabetes    no meds    Vitamin D deficiency     Allergies:  Allergies  Allergen Reactions   Hydrocodone-Acetaminophen Nausea Only   Sudafed [Pseudoephedrine Hcl]     Urinary retention   Tetanus Toxoids     Itching, burning feeling, ran a fever    MEDICATIONS:  enoxaparin (LOVENOX) injection  40 mg Subcutaneous Q24H   polyethylene glycol  17 g Oral Daily   senna  1 tablet Oral Daily   sodium chloride flush  3 mL Intravenous Q12H    Social History   Tobacco Use   Smoking status: Never   Smokeless tobacco: Never  Vaping Use   Vaping Use: Never used  Substance Use Topics   Alcohol use: Yes    Alcohol/week: 0.0 standard drinks    Comment: very Rare /Beer.wine.   Drug use:  No    Family History  Problem Relation Age of Onset   Hyperlipidemia Mother    Glaucoma Mother    Heart disease Father    Hypertension Father    Colon cancer Neg Hx    Esophageal cancer Neg Hx    Pancreatic cancer Neg Hx    Prostate cancer Neg Hx    Rectal cancer Neg Hx    Stomach cancer Neg Hx     Review of Systems  Constitutional: positive for fever, chills, diaphoresis, activity change, positive appetite change, positive  fatigue and unexpected weight change.  HENT: Negative for congestion, sore throat, rhinorrhea, sneezing, trouble swallowing and sinus pressure.  Eyes: Negative for photophobia and visual disturbance.  Respiratory: Negative for cough, chest tightness, shortness of breath, wheezing and stridor.  Cardiovascular: Negative for chest pain, palpitations and leg swelling.  Gastrointestinal: Negative for nausea, vomiting, abdominal pain, diarrhea, constipation, blood in stool, abdominal distention and anal bleeding.  Genitourinary: Negative for dysuria, hematuria, flank pain and difficulty urinating.  Musculoskeletal: Negative for myalgias, back pain, joint swelling, arthralgias and gait problem.  Skin: Negative for color change, pallor, rash and wound.  Neurological: positive for loss of hearing; Negative for dizziness, tremors, weakness and light-headedness.  Hematological: Negative for adenopathy. Does not bruise/bleed easily.  Psychiatric/Behavioral: Negative for behavioral problems, confusion, sleep disturbance, dysphoric mood,  decreased concentration and agitation.    OBJECTIVE: Temp:  [97.8 F (36.6 C)-101.9 F (38.8 C)] 97.8 F (36.6 C) (11/06 0821) Pulse Rate:  [53-89] 54 (11/06 0821) Resp:  [16-27] 17 (11/06 0821) BP: (107-192)/(65-95) 170/82 (11/06 0821) SpO2:  [95 %-100 %] 99 % (11/06 0263) Physical Exam  Constitutional: He is oriented to person, place, and time. He appears well-developed and well-nourished. No distress.  HENT:  Mouth/Throat:  Oropharynx is clear and moist. No oropharyngeal exudate.  Cardiovascular: Normal rate, regular rhythm and normal heart sounds. Exam reveals no gallop and no friction rub.  No murmur heard.  Pulmonary/Chest: Effort normal and breath sounds normal. No respiratory distress. He has no wheezes.  Abdominal: Soft. Bowel sounds are normal. He exhibits no distension. There is no tenderness.  Lymphadenopathy:  He has no cervical adenopathy.  Neurological: He is alert and oriented to person, place, and time.  Skin: Skin is warm and dry. No rash noted. No erythema.  Psychiatric: He has a normal mood and affect. His behavior is normal.    LABS: Results for orders placed or performed during the hospital encounter of 08/22/21 (from the past 48 hour(s))  Comprehensive metabolic panel     Status: Abnormal   Collection Time: 08/22/21  1:58 PM  Result Value Ref Range   Sodium 143 135 - 145 mmol/L   Potassium 3.4 (L) 3.5 - 5.1 mmol/L   Chloride 113 (H) 98 - 111 mmol/L   CO2 22 22 - 32 mmol/L   Glucose, Bld 117 (H) 70 - 99 mg/dL    Comment: Glucose reference range applies only to samples taken after fasting for at least 8 hours.   BUN 24 (H) 8 - 23 mg/dL   Creatinine, Ser 1.45 (H) 0.61 - 1.24 mg/dL   Calcium 7.6 (L) 8.9 - 10.3 mg/dL   Total Protein 4.4 (L) 6.5 - 8.1 g/dL   Albumin 2.4 (L) 3.5 - 5.0 g/dL   AST 13 (L) 15 - 41 U/L   ALT 11 0 - 44 U/L   Alkaline Phosphatase 59 38 - 126 U/L   Total Bilirubin 0.9 0.3 - 1.2 mg/dL   GFR, Estimated 50 (L) >60 mL/min    Comment: (NOTE) Calculated using the CKD-EPI Creatinine Equation (2021)    Anion gap 8 5 - 15    Comment: Performed at [facility], [place] [address], [place], [place] [address]  CBC WITH DIFFERENTIAL     Status: Abnormal   Collection Time: 08/22/21  1:58 PM  Result Value Ref Range   WBC 9.5 4.0 - 10.5 K/uL   RBC 4.09 (L) 4.22 - 5.81 MIL/uL   Hemoglobin 12.5 (L) 13.0 - 17.0 g/dL   HCT 36.9 (L) 39.0 - 52.0 %   MCV 90.2 80.0 - 100.0  fL   MCH 30.6 26.0 - 34.0 pg   MCHC 33.9 30.0 - 36.0 g/dL   RDW 13.5 11.5 - 15.5 %   Platelets 150 150 - 400 K/uL   nRBC 0.0 0.0 - 0.2 %   Neutrophils Relative % 92 %   Neutro Abs 8.8 (H) 1.7 - 7.7 K/uL   Lymphocytes Relative 3 %   Lymphs Abs 0.3 (L) 0.7 - 4.0 K/uL   Monocytes Relative 3 %   Monocytes Absolute 0.3 0.1 - 1.0 K/uL   Eosinophils Relative 0 %   Eosinophils Absolute 0.0 0.0 - 0.5 K/uL   Basophils Relative 0 %   Basophils Absolute 0.0 0.0 - 0.1 K/uL  Immature Granulocytes 2 %   Abs Immature Granulocytes 0.15 (H) 0.00 - 0.07 K/uL    Comment: Performed at [facility], [place] [address] [place], [place] [address]  Protime-INR     Status: Abnormal   Collection Time: 08/22/21  1:58 PM  Result Value Ref Range   Prothrombin Time 16.8 (H) 11.4 - 15.2 seconds   INR 1.4 (H) 0.8 - 1.2    Comment: (NOTE) INR goal varies based on device and disease states. Performed at [facility], [place] [address] [place], [place] [address]   APTT     Status: None   Collection Time: 08/22/21  1:58 PM  Result Value Ref Range   aPTT 29 24 - 36 seconds    Comment: Performed at [facility] [address], [place], [place] [address]  Urinalysis, Routine w reflex microscopic Nasopharyngeal Swab     Status: Abnormal   Collection Time: 08/22/21  1:58 PM  Result Value Ref Range   Color, Urine AMBER (A) YELLOW    Comment: BIOCHEMICALS MAY BE AFFECTED BY COLOR   APPearance CLOUDY (A) CLEAR   Specific Gravity, Urine 1.025 1.005 - 1.030   pH 5.0 5.0 - 8.0   Glucose, UA NEGATIVE NEGATIVE mg/dL   Hgb urine dipstick LARGE (A) NEGATIVE   Bilirubin Urine NEGATIVE NEGATIVE   Ketones, ur 20 (A) NEGATIVE mg/dL   Protein, ur 30 (A) NEGATIVE mg/dL   Nitrite NEGATIVE NEGATIVE   Leukocytes,Ua TRACE (A) NEGATIVE   RBC / HPF >50 (H) 0 - 5 RBC/hpf   WBC, UA 21-50 0 - 5 WBC/hpf   Bacteria, UA NONE SEEN NONE SEEN   Squamous Epithelial / LPF 0-5 0 - 5   Mucus PRESENT     Comment: Performed  at [facility], [place] [address] [place], [place] [address]  Resp Panel by RT-PCR (Flu A&B, Covid) Nasopharyngeal Swab     Status: None   Collection Time: 08/22/21  2:08 PM   Specimen: Nasopharyngeal Swab; Nasopharyngeal(NP) swabs in vial transport medium  Result Value Ref Range   SARS Coronavirus 2 by RT PCR NEGATIVE NEGATIVE    Comment: (NOTE) SARS-CoV-2 target nucleic acids are NOT DETECTED.  The SARS-CoV-2 RNA is generally detectable in upper respiratory specimens during the acute phase of infection. The lowest concentration of SARS-CoV-2 viral copies this assay can detect is 138 copies/mL. A negative result does not preclude SARS-Cov-2 infection and should not be used as the sole basis for treatment or other patient management decisions. A negative result may occur with  improper specimen collection/handling, submission of specimen other than nasopharyngeal swab, presence of viral mutation(s) within the areas targeted by this assay, and inadequate number of viral copies(<138 copies/mL). A negative result must be combined with clinical observations, patient history, and epidemiological information. The expected result is Negative.  Fact Sheet for Patients:  EntrepreneurPulse.com.au  Fact Sheet for Healthcare Providers:  IncredibleEmployment.be  This test is no t yet approved or cleared by the [place] FDA and  has been authorized for detection and/or diagnosis of SARS-CoV-2 by FDA under an Emergency Use Authorization (EUA). This EUA will remain  in effect (meaning this test can be used) for the duration of the COVID-19 declaration under Section 564(b)(1) of the Act, 21 U.S.C.section 360bbb-3(b)(1), unless the authorization is terminated  or revoked sooner.       Influenza A by PCR NEGATIVE NEGATIVE   Influenza B by PCR NEGATIVE NEGATIVE  Comment: (NOTE) The Xpert Xpress SARS-CoV-2/FLU/RSV plus assay is intended as an aid in  the diagnosis of influenza from Nasopharyngeal swab specimens and should not be used as a sole basis for treatment. Nasal washings and aspirates are unacceptable for Xpert Xpress SARS-CoV-2/FLU/RSV testing.  Fact Sheet for Patients: EntrepreneurPulse.com.au  Fact Sheet for Healthcare Providers: IncredibleEmployment.be  This test is not yet approved or cleared by the [place] FDA and has been authorized for detection and/or diagnosis of SARS-CoV-2 by FDA under an Emergency Use Authorization (EUA). This EUA will remain in effect (meaning this test can be used) for the duration of the COVID-19 declaration under Section 564(b)(1) of the Act, 21 U.S.C. section 360bbb-3(b)(1), unless the authorization is terminated or revoked.  Performed at [facility], [place] [address], [place], [place] [address]   Urine Culture     Status: None   Collection Time: 08/22/21  2:08 PM   Specimen: In/Out Cath Urine  Result Value Ref Range   Specimen Description IN/OUT CATH URINE    Special Requests NONE    Culture      NO GROWTH Performed at [facility], [place] [address] [place], [place] [address]    Report Status 08/23/2021 FINAL   Lactic acid, plasma     Status: None   Collection Time: 08/22/21  2:10 PM  Result Value Ref Range   Lactic Acid, Venous 1.1 0.5 - 1.9 mmol/L    Comment: Performed at [facility] [address] [place], [place] [address]  Blood Culture (routine x 2)     Status: None (Preliminary result)   Collection Time: 08/22/21  2:10 PM   Specimen: BLOOD LEFT ARM  Result Value Ref Range   Specimen Description BLOOD LEFT ARM    Special Requests      BOTTLES DRAWN AEROBIC AND ANAEROBIC Blood Culture results may not be optimal due to an excessive volume of blood received in culture bottles   Culture  Setup Time      GRAM POSITIVE COCCI IN CLUSTERS IN BOTH AEROBIC AND ANAEROBIC BOTTLES CRITICAL RESULT CALLED TO, READ BACK BY  AND VERIFIED WITH: E,BREWINGTON PHARMD @1115  08/23/21 EB Performed at [facility], [place] [address] [place], [place] [address]    Culture GRAM POSITIVE COCCI    Report Status PENDING   Blood Culture ID Panel (Reflexed)     Status: Abnormal   Collection Time: 08/22/21  2:10 PM  Result Value Ref Range   Enterococcus faecalis NOT DETECTED NOT DETECTED   Enterococcus Faecium NOT DETECTED NOT DETECTED   Listeria monocytogenes NOT DETECTED NOT DETECTED   Staphylococcus species DETECTED (A) NOT DETECTED    Comment: CRITICAL RESULT CALLED TO, READ BACK BY AND VERIFIED WITH: E,BREWINGTON PHARMD @1115  08/23/21 EB    Staphylococcus aureus (BCID) NOT DETECTED NOT DETECTED   Staphylococcus epidermidis DETECTED (A) NOT DETECTED    Comment: Methicillin (oxacillin) resistant coagulase negative staphylococcus. Possible blood culture contaminant (unless isolated from more than one blood culture draw or clinical case suggests pathogenicity). No antibiotic treatment is indicated for blood  culture contaminants. CRITICAL RESULT CALLED TO, READ BACK BY AND VERIFIED WITH: E,BREWINGTON PHARMD @1115  08/23/21 EB    Staphylococcus lugdunensis NOT DETECTED NOT DETECTED   Streptococcus species NOT DETECTED NOT DETECTED   Streptococcus agalactiae NOT DETECTED NOT DETECTED   Streptococcus pneumoniae NOT DETECTED NOT DETECTED   Streptococcus pyogenes NOT DETECTED NOT DETECTED   A.calcoaceticus-baumannii NOT DETECTED NOT DETECTED   Bacteroides  fragilis NOT DETECTED NOT DETECTED   Enterobacterales NOT DETECTED NOT DETECTED   Enterobacter cloacae complex NOT DETECTED NOT DETECTED   Escherichia coli NOT DETECTED NOT DETECTED   Klebsiella aerogenes NOT DETECTED NOT DETECTED   Klebsiella oxytoca NOT DETECTED NOT DETECTED   Klebsiella pneumoniae NOT DETECTED NOT DETECTED   Proteus species NOT DETECTED NOT DETECTED   Salmonella species NOT DETECTED NOT DETECTED   Serratia marcescens NOT DETECTED NOT DETECTED    Haemophilus influenzae NOT DETECTED NOT DETECTED   Neisseria meningitidis NOT DETECTED NOT DETECTED   Pseudomonas aeruginosa NOT DETECTED NOT DETECTED   Stenotrophomonas maltophilia NOT DETECTED NOT DETECTED   Candida albicans NOT DETECTED NOT DETECTED   Candida auris NOT DETECTED NOT DETECTED   Candida glabrata NOT DETECTED NOT DETECTED   Candida krusei NOT DETECTED NOT DETECTED   Candida parapsilosis NOT DETECTED NOT DETECTED   Candida tropicalis NOT DETECTED NOT DETECTED   Cryptococcus neoformans/gattii NOT DETECTED NOT DETECTED   Methicillin resistance mecA/C DETECTED (A) NOT DETECTED    Comment: CRITICAL RESULT CALLED TO, READ BACK BY AND VERIFIED WITH: E,BREWINGTON PHARMD @1115  08/23/21 EB Performed at [facility] [address], [place], [place] [address]   Blood Culture (routine x 2)     Status: None (Preliminary result)   Collection Time: 08/22/21  2:20 PM   Specimen: BLOOD RIGHT ARM  Result Value Ref Range   Specimen Description BLOOD RIGHT ARM    Special Requests      BOTTLES DRAWN AEROBIC AND ANAEROBIC Blood Culture adequate volume   Culture  Setup Time      GRAM POSITIVE COCCI IN CLUSTERS ANAEROBIC BOTTLE ONLY CRITICAL VALUE NOTED.  VALUE IS CONSISTENT WITH PREVIOUSLY REPORTED AND CALLED VALUE. Performed at [facility], [place] [address] [place], [place] [address]    Culture GRAM POSITIVE COCCI    Report Status PENDING   I-Stat venous blood gas, [facility] ED)     Status: Abnormal   Collection Time: 08/22/21  2:25 PM  Result Value Ref Range   pH, Ven 7.475 (H) 7.250 - 7.430   pCO2, Ven 30.3 (L) 44.0 - 60.0 mmHg   pO2, Ven 76.0 (H) 32.0 - 45.0 mmHg   Bicarbonate 22.3 20.0 - 28.0 mmol/L   TCO2 23 22 - 32 mmol/L   O2 Saturation 96.0 %   Acid-base deficit 1.0 0.0 - 2.0 mmol/L   Sodium 144 135 - 145 mmol/L   Potassium 3.4 (L) 3.5 - 5.1 mmol/L   Calcium, Ion 1.07 (L) 1.15 - 1.40 mmol/L   HCT 36.0 (L) 39.0 - 52.0 %   Hemoglobin 12.2 (L) 13.0 - 17.0 g/dL    Sample type VENOUS   POC occult blood, ED     Status: None   Collection Time: 08/22/21  2:43 PM  Result Value Ref Range   Fecal Occult Bld NEGATIVE NEGATIVE  Lactic acid, plasma     Status: None   Collection Time: 08/22/21  3:58 PM  Result Value Ref Range   Lactic Acid, Venous 1.3 0.5 - 1.9 mmol/L    Comment: Performed at [facility], [place] [address] [place], [place] [address]  Basic metabolic panel     Status: Abnormal   Collection Time: 08/23/21  7:21 AM  Result Value Ref Range   Sodium 141 135 - 145 mmol/L   Potassium 3.6 3.5 - 5.1 mmol/L   Chloride 113 (H) 98 - 111 mmol/L   CO2 22 22 - 32  mmol/L   Glucose, Bld 91 70 - 99 mg/dL    Comment: Glucose reference range applies only to samples taken after fasting for at least 8 hours.   BUN 23 8 - 23 mg/dL   Creatinine, Ser 1.28 (H) 0.61 - 1.24 mg/dL   Calcium 8.3 (L) 8.9 - 10.3 mg/dL   GFR, Estimated 58 (L) >60 mL/min    Comment: (NOTE) Calculated using the CKD-EPI Creatinine Equation (2021)    Anion gap 6 5 - 15    Comment: Performed at [facility] [address] [place], [place] [address]  CBC     Status: Abnormal   Collection Time: 08/23/21  7:21 AM  Result Value Ref Range   WBC 11.0 (H) 4.0 - 10.5 K/uL   RBC 4.09 (L) 4.22 - 5.81 MIL/uL   Hemoglobin 12.5 (L) 13.0 - 17.0 g/dL   HCT 37.1 (L) 39.0 - 52.0 %   MCV 90.7 80.0 - 100.0 fL   MCH 30.6 26.0 - 34.0 pg   MCHC 33.7 30.0 - 36.0 g/dL   RDW 13.5 11.5 - 15.5 %   Platelets 144 (L) 150 - 400 K/uL   nRBC 0.0 0.0 - 0.2 %    Comment: Performed at [facility], [place] [address], [place], [place] [address]    MICRO: 11/5 blood cx 3/4 bottles MRSE IMAGING: CT HEAD WO CONTRAST (5MM)  Result Date: 08/22/2021 CLINICAL DATA:  Altered mental status. EXAM: CT HEAD WITHOUT CONTRAST TECHNIQUE: Contiguous axial images were obtained from the base of the skull through the vertex without intravenous contrast. COMPARISON:  August 19, 2021 FINDINGS: Brain: No evidence of  acute infarction, hemorrhage, hydrocephalus, extra-axial collection or mass lesion/mass effect. Marked brain parenchymal volume loss and deep white matter microangiopathy. Vascular: No hyperdense vessel or unexpected calcification. Skull: Normal. Negative for fracture or focal lesion. Sinuses/Orbits: No acute finding. Other: None. IMPRESSION: 1. No acute intracranial abnormality. 2. Marked brain parenchymal atrophy and chronic microvascular disease. Electronically Signed   By: Fidela Salisbury M.D.   On: 08/22/2021 16:36   CT CHEST WO CONTRAST  Result Date: 08/23/2021 CLINICAL DATA:  Fever of unknown origin. EXAM: CT CHEST WITHOUT CONTRAST TECHNIQUE: Multidetector CT imaging of the chest was performed following the standard protocol without IV contrast. COMPARISON:  None. FINDINGS: Cardiovascular: No acute findings. Aortic and coronary atherosclerotic calcification noted. Mediastinum/Nodes: No masses or pathologically enlarged lymph nodes identified on this unenhanced exam. Lungs/Pleura: No suspicious nodules or masses identified. No evidence of infiltrate or pleural effusion. Upper Abdomen:  Unremarkable. Musculoskeletal:  No suspicious bone lesions. IMPRESSION: No active disease within the thorax. Aortic Atherosclerosis (ICD10-I70.0). Electronically Signed   By: Marlaine Hind M.D.   On: 08/23/2021 12:42   DG Chest Port 1 View  Result Date: 08/22/2021 CLINICAL DATA:  Questionable sepsis. EXAM: PORTABLE CHEST 1 VIEW COMPARISON:  August 18, 2021. FINDINGS: Patient is rotated. Aortic atherosclerosis. Mediastinal contours or stable with mildly tortuous thoracic aorta. Normal size heart. Chronic senescent lung changes. Streaky bibasilar opacities are similar to prior. No visible pleural effusion or pneumothorax. The visualized skeletal structures are unchanged including chronic left lateral fifth rib fracture. IMPRESSION: 1. No acute cardiopulmonary finding. 2.  Aortic Atherosclerosis (ICD10-I70.0).  Electronically Signed   By: Dahlia Bailiff M.D.   On: 08/22/2021 15:25     Assessment/Plan:  75yo M with high fevers and staph epi bacteremia, source unknown - onset shortly after being discharged in the previously 24-48hrs. Possibly hospital onset bacteremia. Areas  of PIV do not appear infiltrated.  - recommend to continue on vancomycin for MRSE bacteremia and discontinue cefepime - repeat blood cx on 11/8 - please get TTE - if has recurrent fevers, would recommend to repeat blood cx to see if there is concern for endcovascular/endocarditis.  Elzie Rings [facility] [phone]

## 2021-08-23 NOTE — Progress Notes (Signed)
  Echocardiogram 2D Echocardiogram has been performed.   Exam was difficult to perform due to patient's inability to cooperate.  Mathew Miller 08/23/2021, 3:59 PM

## 2021-08-23 NOTE — Evaluation (Signed)
Physical Therapy Evaluation Patient Details Name: Mathew Miller. MRN: [id number] DOB: [birth year] Today's Date: 08/23/2021  History of Present Illness  Mathew Miller. is a 75 y.o. male presenting with sepsis.  He was previously admitted from 11/1-3 with the same, started on antibiotics, but no source was found and he was discharged with indwelling foley.   Morning of this admission, he had a fever to 105.7; His wife reports that normally he can transfer to the bathroom with a walker and help.  He calls her when she leaves his sight.  He has been able to feed himself until the week leading up to last admission, but recently it has been easier for her to feed him.  He had a stroke a year ago and has had limited LUE strength;He usually is able to communicate but this AM of admission he did not speak at all. with medical history significant of HTN; HLD; stage 3a CKD; advanced dementia; dysautonomia with postural hypotension; h/o CVA; and urinary retention with foley catheter placement  Clinical Impression   Pt admitted with above diagnosis. Lives at home with wife, who can provide around the clock supervision; At baseline, pt is able to walk short distances with a RW (typically to get to the bathroom at home);  Presents to PT with decr functional mobility, decr activity tolerance, stiff trunk, decr ability to interact -- though much improved campare to previous PT eval 4 days ago; requiring Max assist for bed mobility and transfers, but noteworthy that pt did recruit the large muscle groups in bil LEs to help with the act of standing; Pt currently with functional limitations due to the deficits listed below (see PT Problem List). Pt will benefit from skilled PT to increase their independence and safety with mobility to allow discharge to the venue listed below.      Eyes open majority of session; will make eye contact when his name "Mathew Miller" is called; needs lots of incr time to answer yes/no questions;  seems to interact more when he can see whole face; smiled when asked about music, and when this PT forgot the words to a country song (wife, Mathew Miller stated pt likes country music); waved goodbye when we called out "Mathew Miller" at end of session;  Per Helaine Chess, NP with Palliative, goal is to optimize mobility and ADLs at SNF for rehab, with the goal of getting back home, perhaps with Hospice Services eventually at home.     Recommendations for follow up therapy are one component of a multi-disciplinary discharge planning process, led by the attending physician.  Recommendations may be updated based on patient status, additional functional criteria and insurance authorization.  Follow Up Recommendations Skilled nursing-short term rehab (<3 hours/day)    Assistance Recommended at Discharge Frequent or constant Supervision/Assistance  Functional Status Assessment Patient has had a recent decline in their functional status and demonstrates the ability to make significant improvements in function in a reasonable and predictable amount of time.  Equipment Recommendations  Wheelchair cushion (measurements PT);Hospital bed;Other (comment) (hoyer lift with pad)    Recommendations for Other Services       Precautions / Restrictions Precautions Precautions: Fall Precaution Comments: Watch for signs of presyncope/syncope; history of orthostatic hypotension, and SBP drop from 182 supine to 112 in chair immediately post stand pivot transfer      Mobility  Bed Mobility Overal bed mobility: Needs Assistance Bed Mobility: Rolling;Sidelying to Sit Rolling: Max assist Sidelying to sit: Max assist;+2 for  physical assistance       General bed mobility comments: Needs assist to bend hips and knees up to start bed moblity in hooklying position; gentle roccking to loosen up hip and lower back prior to getting up; Max assist and use of bed pad to roll into L sidelying; Max assist of 2 (for stability in front  and back) to push up to sit    Transfers Overall transfer level: Needs assistance Equipment used: 2 person hand held assist (and use of gait belt and bed pad) Transfers: Sit to/from Stand;Stand Pivot Transfers Sit to Stand: Max assist;+2 physical assistance Stand pivot transfers: Max assist;+2 physical assistance         General transfer comment: Pt required Max assist to stand and pivot to recliner; Notable that he was able to recruit bil LE musculature to help in pushing to fully upright standing once his center of mass was translated over feet    Ambulation/Gait                Stairs            Wheelchair Mobility    Modified Rankin (Stroke Patients Only)       Balance     Sitting balance-Leahy Scale: Poor Sitting balance - Comments: Needs assist for sitting balance; at times, mod assist, at times Max assist Postural control: Posterior lean                                   Pertinent Vitals/Pain Pain Assessment: Faces Faces Pain Scale: Hurts a little bit Pain Location: Occasional grimace with uncomfortable motions like upper/lower trunk and neck rotation stretches Pain Descriptors / Indicators: Grimacing Pain Intervention(s): Monitored during session    Home Living Family/patient expects to be discharged to:: Private residence Living Arrangements: Spouse/significant other Available Help at Discharge: Family;Available 24 hours/day Type of Home: House Home Access: Ramped entrance   Entrance Stairs-Number of Steps: 6 Alternate Level Stairs-Number of Steps: 5-6 Home Layout: Multi-level Home Equipment: Rolling Walker (2 wheels);Wheelchair - manual;Shower seat;Hospital bed      Prior Function Prior Level of Function : Needs assist  Cognitive Assist : Mobility (cognitive);ADLs (cognitive) Mobility (Cognitive): Intermittent cues ADLs (Cognitive): Intermittent cues Physical Assist : Mobility (physical);ADLs (physical) Mobility  (physical): Bed mobility;Transfers ADLs (physical): Bathing;Dressing;Toileting;IADLs Mobility Comments: Spouse states patient continues to perform transfers to [facility] with assist and RW       Hand Dominance   Dominant Hand: Right    Extremity/Trunk Assessment   Upper Extremity Assessment Upper Extremity Assessment: Defer to OT evaluation (minimal volitional movement noted)    Lower Extremity Assessment Lower Extremity Assessment: Difficult to assess due to impaired cognition (Very slow to respond to cues for moblity; Assist to flex knees and hips in supine to get into hooklying position; performed gentle rocking of bil knees side to side to loosen up hips and lower back; noteworthy that pt did recruit the large muscle groups in bil LEs to help with the act of standing)    Cervical / Trunk Assessment Cervical / Trunk Assessment: Kyphotic (Overall "C" shape of trunk from lower back to C-spine when sitting EOB)  Communication   Communication: HOH  Cognition Arousal/Alertness: Awake/alert Behavior During Therapy: Flat affect Overall Cognitive Status: Impaired/Different from baseline Area of Impairment: Orientation;Attention;Memory;Following commands;Safety/judgement  General Comments: Eyes open majority of session; will make eye contact when his name "Mathew Miller" is called; needs lots of incr time to answer yes/no questions; seems to interact more when he can see whole face; smiled when asked about music, and when this PT forgot the words to a country song (wife, Mathew Miller stated pt likes country music); waved goodbye when we called out "Mathew Miller" at end of session        General Comments General comments (skin integrity, edema, etc.):   08/23/21 0915 08/23/21 0920 08/23/21 0925  Vital Signs  Patient Position (if appropriate) Orthostatic Vitals  --   --   Orthostatic Lying   BP- Lying 182/81 (MAP 108)  --   --   Pulse- Lying 51  --   --   Orthostatic  Sitting  BP- Sitting 159/77 (MAP101) 112/82 (sitting in chair immediately after stand pivot transfer to recliner; MAP 93) 142/81 (in recliner, feet up; MAP 100)  Pulse- Sitting 52 64 56  Orthostatic Standing at 0 minutes  BP- Standing at 0 minutes  (unable to stand long enough to obtain BP)  --   --       Exercises     Assessment/Plan    PT Assessment Patient needs continued PT services  PT Problem List Decreased strength;Decreased activity tolerance;Decreased balance;Decreased mobility;Decreased knowledge of use of DME;Decreased safety awareness;Decreased cognition;Decreased knowledge of precautions       PT Treatment Interventions Gait training;DME instruction;Functional mobility training;Therapeutic activities;Therapeutic exercise;Balance training;Patient/family education;Cognitive remediation    PT Goals (Current goals can be found in the Care Plan section)  Acute Rehab PT Goals Patient Stated Goal: Per Helaine Chess, NP with Palliative, goal is to optimize mobility and ADLs at SNF for rehab, with the goal of gettin gback home PT Goal Formulation: With family Time For Goal Achievement: 09/06/21 Potential to Achieve Goals: Good    Frequency Min 2X/week   Barriers to discharge        Co-evaluation               AM-PAC PT "6 Clicks" Mobility  Outcome Measure Help needed turning from your back to your side while in a flat bed without using bedrails?: A Lot Help needed moving from lying on your back to sitting on the side of a flat bed without using bedrails?: Total Help needed moving to and from a bed to a chair (including a wheelchair)?: A Lot Help needed standing up from a chair using your arms (e.g., wheelchair or bedside chair)?: A Lot Help needed to walk in hospital room?: Total Help needed climbing 3-5 steps with a railing? : Total 6 Click Score: 9    End of Session Equipment Utilized During Treatment: Gait belt;Other (comment) (Bed pad) Activity Tolerance:  Patient tolerated treatment well;Other (comment) (Able to particiapte, even if minimally) Patient left: in chair;with call bell/phone within reach;with family/visitor present;Other (comment) (Chair alarm monitor pad in recliner, but no box to plug it into) Nurse Communication: Mobility status;Need for lift equipment;Other (comment) (Also to potentially plan transfer back to bed with OT) PT Visit Diagnosis: Muscle weakness (generalized) (M62.81);Difficulty in walking, not elsewhere classified (R26.2)    Time: 8315-1761 PT Time Calculation (min) (ACUTE ONLY): 28 min   Charges:   PT Evaluation $PT Eval Moderate Complexity: 1 Mod PT Treatments $Therapeutic Activity: 8-22 mins        Roney Marion, PT  Acute Rehabilitation Services Pager [phone] Office [phone]   Colletta [place] 08/23/2021, 11:51 AM

## 2021-08-24 ENCOUNTER — Telehealth: Payer: Self-pay

## 2021-08-24 DIAGNOSIS — R509 Fever, unspecified: Secondary | ICD-10-CM | POA: Diagnosis not present

## 2021-08-24 DIAGNOSIS — R7881 Bacteremia: Secondary | ICD-10-CM | POA: Diagnosis present

## 2021-08-24 DIAGNOSIS — B957 Other staphylococcus as the cause of diseases classified elsewhere: Secondary | ICD-10-CM | POA: Diagnosis not present

## 2021-08-24 DIAGNOSIS — E440 Moderate protein-calorie malnutrition: Secondary | ICD-10-CM | POA: Diagnosis present

## 2021-08-24 DIAGNOSIS — Z7189 Other specified counseling: Secondary | ICD-10-CM | POA: Diagnosis not present

## 2021-08-24 DIAGNOSIS — Z515 Encounter for palliative care: Secondary | ICD-10-CM | POA: Diagnosis not present

## 2021-08-24 LAB — BASIC METABOLIC PANEL
Anion gap: 7 (ref 5–15)
BUN: 20 mg/dL (ref 8–23)
CO2: 23 mmol/L (ref 22–32)
Calcium: 8.6 mg/dL — ABNORMAL LOW (ref 8.9–10.3)
Chloride: 111 mmol/L (ref 98–111)
Creatinine, Ser: 1 mg/dL (ref 0.61–1.24)
GFR, Estimated: >60 mL/min (ref >60)
Glucose, Bld: 99 mg/dL (ref 70–99)
Potassium: 3.3 mmol/L — ABNORMAL LOW (ref 3.5–5.1)
Sodium: 141 mmol/L (ref 135–145)

## 2021-08-24 LAB — CULTURE, BLOOD (SINGLE): Culture: NO GROWTH

## 2021-08-24 LAB — CBC
HCT: 38.2 % — ABNORMAL LOW (ref 39.0–52.0)
Hemoglobin: 13.2 g/dL (ref 13.0–17.0)
MCH: 30.3 pg (ref 26.0–34.0)
MCHC: 34.6 g/dL (ref 30.0–36.0)
MCV: 87.8 fL (ref 80.0–100.0)
Platelets: 157 10*3/uL (ref 150–400)
RBC: 4.35 MIL/uL (ref 4.22–5.81)
RDW: 13.4 % (ref 11.5–15.5)
WBC: 11.4 10*3/uL — ABNORMAL HIGH (ref 4.0–10.5)
nRBC: 0 % (ref 0.0–0.2)

## 2021-08-24 MED ORDER — CHLORHEXIDINE GLUCONATE CLOTH 2 % EX PADS
6.0000 | MEDICATED_PAD | Freq: Every day | CUTANEOUS | Status: DC
  Administered 2021-08-24 – 2021-09-01 (×8): 6 via TOPICAL

## 2021-08-24 MED ORDER — ENSURE ENLIVE PO LIQD
237.0000 mL | Freq: Two times a day (BID) | ORAL | Status: DC
  Administered 2021-08-25 – 2021-09-01 (×13): 237 mL via ORAL
  Filled 2021-08-24: qty 237

## 2021-08-24 MED ORDER — SUVOREXANT 15 MG PO TABS
15.0000 mg | ORAL_TABLET | Freq: Every day | ORAL | Status: DC
  Administered 2021-08-24 – 2021-09-01 (×9): 15 mg via ORAL
  Filled 2021-08-24 (×2): qty 1

## 2021-08-24 MED ORDER — POTASSIUM CHLORIDE 10 MEQ/100ML IV SOLN
10.0000 meq | INTRAVENOUS | Status: AC
  Administered 2021-08-24 (×3): 10 meq via INTRAVENOUS
  Filled 2021-08-24 (×3): qty 100

## 2021-08-24 MED ORDER — RENA-VITE PO TABS
1.0000 | ORAL_TABLET | Freq: Every day | ORAL | Status: DC
  Administered 2021-08-24 – 2021-09-01 (×9): 1 via ORAL
  Filled 2021-08-24 (×9): qty 1

## 2021-08-24 NOTE — NC FL2 (Signed)
[place] LEVEL OF CARE SCREENING TOOL     IDENTIFICATION  Patient Name: Mathew Miller. Birthdate: 1945/10/29 Sex: male Admission Date (Current Location): 08/22/2021  [facility] and [place] Number:  Herbalist and Address:  [facility] [place], [place] [address] [place], [place] [address]      Provider Number: [id number]  Attending Physician Name and Address:  Elmarie Shiley, MD  Relative Name and Phone Number:  Gregori Abril [phone]    Current Level of Care: Hospital Recommended Level of Care: [place] Prior Approval Number:    Date Approved/Denied:   PASRR Number: [id number]  Discharge Plan: SNF    Current Diagnoses: Patient Active Problem List   Diagnosis Date Noted   Fever 50/12/7046   Acute metabolic encephalopathy 88/91/6945   Sepsis ([place]) 08/19/2021   Acute-on-chronic kidney injury ([place]) 08/19/2021   Orthostatic hypotension 12/30/2020   Prolonged QT interval 12/30/2020   Dementia ([place])    Palliative care by specialist    CVA (cerebral vascular accident) ([place]) 07/15/2020   Hypokalemia    B12 deficiency 03/26/2020   Unstable gait 10/01/2019   Frequent falls 10/01/2019   Imbalance 02/27/2018   BMI 24.0-24.9, adult 02/26/2018   Acid reflux 02/26/2018   Memory changes 02/26/2018   Vitamin D deficiency 03/01/2014   Goals of care, counseling/discussion 03/01/2014   Hyperlipidemia    History of prediabetes    HTN (hypertension) 10/09/2013    Orientation RESPIRATION BLADDER Height & Weight     Self  Normal Incontinent, Indwelling catheter Weight:   Height:     BEHAVIORAL SYMPTOMS/MOOD NEUROLOGICAL BOWEL NUTRITION STATUS      Incontinent Diet (See DC summary)  AMBULATORY STATUS COMMUNICATION OF NEEDS Skin   Extensive Assist Verbally Skin abrasions (Bilateral Ecchymosis)                       Personal Care Assistance Level of Assistance  Bathing, Feeding, Dressing Bathing  Assistance: Maximum assistance Feeding assistance: Maximum assistance Dressing Assistance: Maximum assistance     Functional Limitations Info  Sight, Hearing, Speech Sight Info: Impaired Hearing Info: Impaired Speech Info: Adequate    SPECIAL CARE FACTORS FREQUENCY  PT (By licensed PT), OT (By licensed OT)     PT Frequency: 5x week OT Frequency: 5x week            Contractures Contractures Info: Not present    Additional Factors Info  Code Status, Allergies Code Status Info: DNR Allergies Info: Hydrocodone-acetaminophen   Sudafed (Pseudoephedrine Hcl)   Tetanus Toxoids           Current Medications (08/24/2021):  This is the current hospital active medication list Current Facility-Administered Medications  Medication Dose Route Frequency Provider Last Rate Last Admin   acetaminophen (TYLENOL) tablet 650 mg  650 mg Oral Q6H PRN Karmen Bongo, MD       Or   acetaminophen (TYLENOL) suppository 650 mg  650 mg Rectal Q6H PRN Karmen Bongo, MD       aspirin EC tablet 81 mg  81 mg Oral Daily Regalado, Belkys A, MD   81 mg at 08/24/21 1024   bisacodyl (DULCOLAX) suppository 10 mg  10 mg Rectal Daily PRN Karmen Bongo, MD       Chlorhexidine Gluconate Cloth 2 % PADS 6 each  6 each Topical Daily Regalado, Belkys A, MD       enoxaparin (LOVENOX) injection 40 mg  40 mg Subcutaneous  Q24H Karmen Bongo, MD   40 mg at 08/23/21 1632   hydrALAZINE (APRESOLINE) injection 5 mg  5 mg Intravenous Q4H PRN Karmen Bongo, MD       lactated ringers infusion   Intravenous Continuous Karmen Bongo, MD 75 mL/hr at 08/23/21 2212 Restarted at 08/23/21 2212   melatonin tablet 5 mg  5 mg Oral QHS PRN Lovey Newcomer T, NP   5 mg at 08/23/21 2337   morphine 2 MG/ML injection 2 mg  2 mg Intravenous Q2H PRN Karmen Bongo, MD   2 mg at 08/24/21 0026   polyethylene glycol (MIRALAX / GLYCOLAX) packet 17 g  17 g Oral Daily Regalado, Belkys A, MD   17 g at 08/24/21 1024   pyridostigmine (MESTINON)  tablet 60 mg  60 mg Oral BID Regalado, Belkys A, MD   60 mg at 08/24/21 1024   rosuvastatin (CRESTOR) tablet 5 mg  5 mg Oral Daily Regalado, Belkys A, MD   5 mg at 08/24/21 1024   senna (SENOKOT) tablet 8.6 mg  1 tablet Oral Daily Regalado, Belkys A, MD   8.6 mg at 08/24/21 1024   sodium chloride flush (NS) 0.9 % injection 3 mL  3 mL Intravenous Q12H Karmen Bongo, MD   3 mL at 08/24/21 1032   vancomycin (VANCOREADY) IVPB 1250 mg/250 mL  1,250 mg Intravenous Q24H Karmen Bongo, MD   Stopped at 08/23/21 1930     Discharge Medications: Please see discharge summary for a list of discharge medications.  Relevant Imaging Results:  Relevant Lab Results:   Additional Information SS#: [id number]  Coralee Pesa, LCSWA

## 2021-08-24 NOTE — Progress Notes (Signed)
Initial Nutrition Assessment  DOCUMENTATION CODES:   Non-severe (moderate) malnutrition in context of chronic illness  INTERVENTION:   Renal Multivitamin w/ minerals daily  Ensure Enlive po BID, each supplement provides 350 kcal and 20 grams of protein  Encourage good PO intake  NUTRITION DIAGNOSIS:   Moderate Malnutrition related to chronic illness (Dementia) as evidenced by mild fat depletion, mild muscle depletion.  GOAL:   Patient will meet greater than or equal to 90% of their needs  MONITOR:   PO intake, Supplement acceptance, I & O's, Labs  REASON FOR ASSESSMENT:   Consult Assessment of nutrition requirement/status  ASSESSMENT:   75 y.o. male presented to the ED with a fever; pt recently admitted 11/1-11/3 for the same reason. Upon admission no source of infection was found, foley was placed and pt was d/c. PMH includes HTN, advanced dementia, CKD IIIa, and a CVA. Pt admitted for unknown cause of fever.   Pt sleeping in bed and does not wake to [facility] voice or touch. Pt wife in room and able to provide nutrition related information.  Per wife, pt has been eating ok at home; will eat whatever is put in front of him. Per wife a typical intake includes: Breakfast: bacon and eggs, Lunch: chicken salad sandwich, Dinner: meat and a couple vegetables. Wife reports that he does not eat a whole lot with each meal. Denies any difficulty chewing or swallowing post-stroke. Wife reports that pt will drink ~2 Ensures per week.   Wife reports a UBW of 180# about year ago and has gradually decreased to 147#.  Per EMR, pt has not had any significant weight loss. Pt still has left side weakness following his stroke. Per H&P, pt ambulates with a walker and help to the bathroom. PLDN unsure how much additional ambulating pt is doing at home.   Medications reviewed and include: Miralax, Senokot, IV antibiotics Labs reviewed: Potassium 3.3  NUTRITION - FOCUSED PHYSICAL EXAM:  Flowsheet  Row Most Recent Value  Orbital Region Mild depletion  Upper Arm Region No depletion  Thoracic and Lumbar Region No depletion  Buccal Region Mild depletion  Temple Region Mild depletion  Clavicle Bone Region Mild depletion  [Unable to assess R side, pt sleeping and leaning]  Clavicle and Acromion Bone Region Mild depletion  Scapular Bone Region Mild depletion  Dorsal Hand Mild depletion  Patellar Region Severe depletion  Anterior Thigh Region Severe depletion  Posterior Calf Region Severe depletion  Edema (RD Assessment) None  Hair Reviewed  Eyes Unable to assess  [Pt sleeping]  Mouth Unable to assess  [Pt sleeping]  Skin Reviewed  Nails Reviewed       Diet Order:   Diet Order             Diet regular Room service appropriate? Yes; Fluid consistency: Thin  Diet effective now                   EDUCATION NEEDS:   No education needs have been identified at this time  Skin:  Skin Assessment: Reviewed RN Assessment  Last BM:  08/22/2021  Height:   Ht Readings from Last 1 Encounters:  08/24/21 6\' 2"  (1.88 m)    Weight:   Wt Readings from Last 1 Encounters:  08/24/21 70.7 kg    Ideal Body Weight:  86.4 kg  BMI:  Body mass index is 20.01 kg/m.  Estimated Nutritional Needs:   Kcal:  2150-2350  Protein:  110-125 grams  Fluid:  >  2 L    Kasumi Ditullio BS, PLDN Clinical Dietitian See AMiON for contact information.

## 2021-08-24 NOTE — Telephone Encounter (Signed)
Left message for patient to call back to schedule a Hospital Follow Up.

## 2021-08-24 NOTE — Progress Notes (Addendum)
[facility] Infectious Diseases Follow Up Note  Patient Identification: Patient Name: Mathew Miller. MRN: [id number] Admit Date: 08/22/2021  1:51 PM Age: 75 y.o.Today's Date: 08/24/2021  Reason for Visit:   Principal Problem:   Fever Active Problems:   Hyperlipidemia   Goals of care, counseling/discussion   CVA (cerebral vascular accident) ([place])   Dementia ([place])   Prolonged QT interval   Acute metabolic encephalopathy   Antibiotics: Vancomycin 11/5 -                    Cefepime 11/5 -11/6   Lines/Hardwares:   Interval Events: afebrile   Assessment MRSE bacteremia, High grade: Unclear source, he was recently admitted 11/1-11/3 hence, concerns for phlebitis but no such signs on exam. No known hardware. TTE unremarkable ( limited exam) Fevers- only one episode, UA with greater than 50 RBCs, 21-50 WBCs. 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 Thank you for the consult. Please page with pertinent questions or concerns.  ______________________________________________________________________ Subjective patient seen and examined at the bedside. He is confused. Spoke with wife at bedside. He is very drowsy as per wife he did not sleep last night   Vitals BP (!) 150/82 (BP Location: Left Arm)   Pulse (!) 55   Temp 97.7 F (36.5 C) (Oral)   Resp 16   SpO2 100%      Physical Exam Constitutional:  sitting up in bed, drowsy     Comments:    Cardiovascular:     Rate and Rhythm: Normal rate and regular rhythm.     Heart sounds:   Pulmonary:     Effort: Pulmonary effort is normal.     Comments:   Abdominal:     Palpations: Abdomen is soft.     Tenderness: Non tender and non distended   Musculoskeletal:        General: No swelling or tenderness.   Skin:    Comments: No signs of phlebitis on exam   Neurological:     General: uncooperative to exam due to drowsiness and wanting to sleep   Psychiatric:        Mood and Affect: Confused    Pertinent Microbiology Results for orders placed or performed during the hospital encounter of 08/22/21  Resp Panel by RT-PCR (Flu A&B, Covid) Nasopharyngeal Swab     Status: None   Collection Time: 08/22/21  2:08 PM   Specimen: Nasopharyngeal Swab; Nasopharyngeal(NP) swabs in vial transport medium  Result Value Ref Range Status   SARS Coronavirus 2 by RT PCR NEGATIVE NEGATIVE Final    Comment: (NOTE) SARS-CoV-2 target nucleic acids are NOT DETECTED.  The SARS-CoV-2 RNA is generally detectable in upper respiratory specimens during the acute phase of infection. The lowest concentration of SARS-CoV-2 viral copies this assay can detect is 138 copies/mL. A negative result does not preclude SARS-Cov-2 infection and should not be used as the sole basis for treatment or other patient management decisions. A negative result may occur with  improper specimen collection/handling, submission of specimen  other than nasopharyngeal swab, presence of viral mutation(s) within the areas targeted by this assay, and inadequate number of viral copies(<138 copies/mL). A negative result must be combined with clinical observations, patient history, and epidemiological information. The expected result is Negative.  Fact Sheet for Patients:  EntrepreneurPulse.com.au  Fact Sheet for Healthcare Providers:  IncredibleEmployment.be  This test is no t yet approved  or cleared by the [place] FDA and  has been authorized for detection and/or diagnosis of SARS-CoV-2 by FDA under an Emergency Use Authorization (EUA). This EUA will remain  in effect (meaning this test can be used) for the duration of the COVID-19 declaration under Section 564(b)(1) of the Act, 21 U.S.C.section 360bbb-3(b)(1), unless the authorization is terminated  or revoked sooner.       Influenza A by PCR NEGATIVE NEGATIVE Final   Influenza B by PCR NEGATIVE NEGATIVE Final    Comment: (NOTE) The Xpert Xpress SARS-CoV-2/FLU/RSV plus assay is intended as an aid in the diagnosis of influenza from Nasopharyngeal swab specimens and should not be used as a sole basis for treatment. Nasal washings and aspirates are unacceptable for Xpert Xpress SARS-CoV-2/FLU/RSV testing.  Fact Sheet for Patients: EntrepreneurPulse.com.au  Fact Sheet for Healthcare Providers: IncredibleEmployment.be  This test is not yet approved or cleared by the [place] FDA and has been authorized for detection and/or diagnosis of SARS-CoV-2 by FDA under an Emergency Use Authorization (EUA). This EUA will remain in effect (meaning this test can be used) for the duration of the COVID-19 declaration under Section 564(b)(1) of the Act, 21 U.S.C. section 360bbb-3(b)(1), unless the authorization is terminated or revoked.  Performed at [facility], [place] [address], [place], [place] [address]   Urine Culture     Status: None   Collection Time: 08/22/21  2:08 PM   Specimen: In/Out Cath Urine  Result Value Ref Range Status   Specimen Description IN/OUT CATH URINE  Final   Special Requests NONE  Final   Culture   Final    NO GROWTH Performed at [facility], [place] [address], [place], [place] [address]    Report Status 08/23/2021 FINAL  Final  Blood Culture (routine x 2)     Status: None (Preliminary result)   Collection Time: 08/22/21  2:10 PM   Specimen:  BLOOD LEFT ARM  Result Value Ref Range Status   Specimen Description BLOOD LEFT ARM  Final   Special Requests   Final    BOTTLES DRAWN AEROBIC AND ANAEROBIC Blood Culture results may not be optimal due to an excessive volume of blood received in culture bottles   Culture  Setup Time   Final    GRAM POSITIVE COCCI IN CLUSTERS IN BOTH AEROBIC AND ANAEROBIC BOTTLES CRITICAL RESULT CALLED TO, READ BACK BY AND VERIFIED WITH: E,BREWINGTON PHARMD @1115  08/23/21 EB Performed at [facility], [place] [address] [place], [place] [address]    Culture GRAM POSITIVE COCCI  Final   Report Status PENDING  Incomplete  Blood Culture ID Panel (Reflexed)     Status: Abnormal   Collection Time: 08/22/21  2:10 PM  Result Value Ref Range Status   Enterococcus faecalis NOT DETECTED NOT DETECTED Final   Enterococcus Faecium NOT DETECTED NOT DETECTED Final   Listeria monocytogenes NOT DETECTED NOT DETECTED Final   Staphylococcus species DETECTED (A) NOT DETECTED Final    Comment: CRITICAL RESULT CALLED TO, READ BACK BY AND VERIFIED WITH: E,BREWINGTON PHARMD @1115  08/23/21 EB  Staphylococcus aureus (BCID) NOT DETECTED NOT DETECTED Final   Staphylococcus epidermidis DETECTED (A) NOT DETECTED Final    Comment: Methicillin (oxacillin) resistant coagulase negative staphylococcus. Possible blood culture contaminant (unless isolated from more than one blood culture draw or clinical case suggests pathogenicity). No antibiotic treatment is indicated for blood  culture contaminants. CRITICAL RESULT CALLED TO, READ BACK BY AND VERIFIED WITH: E,BREWINGTON PHARMD @1115  08/23/21 EB    Staphylococcus lugdunensis NOT DETECTED NOT DETECTED Final   Streptococcus species NOT DETECTED NOT DETECTED Final   Streptococcus agalactiae NOT DETECTED NOT DETECTED Final   Streptococcus pneumoniae NOT DETECTED NOT DETECTED Final   Streptococcus pyogenes NOT DETECTED NOT DETECTED Final   A.calcoaceticus-baumannii NOT DETECTED NOT  DETECTED Final   Bacteroides fragilis NOT DETECTED NOT DETECTED Final   Enterobacterales NOT DETECTED NOT DETECTED Final   Enterobacter cloacae complex NOT DETECTED NOT DETECTED Final   Escherichia coli NOT DETECTED NOT DETECTED Final   Klebsiella aerogenes NOT DETECTED NOT DETECTED Final   Klebsiella oxytoca NOT DETECTED NOT DETECTED Final   Klebsiella pneumoniae NOT DETECTED NOT DETECTED Final   Proteus species NOT DETECTED NOT DETECTED Final   Salmonella species NOT DETECTED NOT DETECTED Final   Serratia marcescens NOT DETECTED NOT DETECTED Final   Haemophilus influenzae NOT DETECTED NOT DETECTED Final   Neisseria meningitidis NOT DETECTED NOT DETECTED Final   Pseudomonas aeruginosa NOT DETECTED NOT DETECTED Final   Stenotrophomonas maltophilia NOT DETECTED NOT DETECTED Final   Candida albicans NOT DETECTED NOT DETECTED Final   Candida auris NOT DETECTED NOT DETECTED Final   Candida glabrata NOT DETECTED NOT DETECTED Final   Candida krusei NOT DETECTED NOT DETECTED Final   Candida parapsilosis NOT DETECTED NOT DETECTED Final   Candida tropicalis NOT DETECTED NOT DETECTED Final   Cryptococcus neoformans/gattii NOT DETECTED NOT DETECTED Final   Methicillin resistance mecA/C DETECTED (A) NOT DETECTED Final    Comment: CRITICAL RESULT CALLED TO, READ BACK BY AND VERIFIED WITH: E,BREWINGTON PHARMD @1115  08/23/21 EB Performed at [facility], [address], [place], [place] [address]   Blood Culture (routine x 2)     Status: None (Preliminary result)   Collection Time: 08/22/21  2:20 PM   Specimen: BLOOD RIGHT ARM  Result Value Ref Range Status   Specimen Description BLOOD RIGHT ARM  Final   Special Requests   Final    BOTTLES DRAWN AEROBIC AND ANAEROBIC Blood Culture adequate volume   Culture  Setup Time   Final    GRAM POSITIVE COCCI IN CLUSTERS IN BOTH AEROBIC AND ANAEROBIC BOTTLES CRITICAL VALUE NOTED.  VALUE IS CONSISTENT WITH PREVIOUSLY REPORTED AND CALLED  VALUE. Performed at [facility], [place] [address] [place], [place] [address]    Culture GRAM POSITIVE COCCI  Final   Report Status PENDING  Incomplete    Pertinent Lab. CBC Latest Ref Rng & Units 08/23/2021 08/22/2021 08/22/2021  WBC 4.0 - 10.5 K/uL 11.0(H) - 9.5  Hemoglobin 13.0 - 17.0 g/dL 12.5(L) 12.2(L) 12.5(L)  Hematocrit 39.0 - 52.0 % 37.1(L) 36.0(L) 36.9(L)  Platelets 150 - 400 K/uL 144(L) - 150   CMP Latest Ref Rng & Units 08/23/2021 08/22/2021 08/22/2021  Glucose 70 - 99 mg/dL 91 - 117(H)  BUN 8 - 23 mg/dL 23 - 24(H)  Creatinine 0.61 - 1.24 mg/dL 1.28(H) - 1.45(H)  Sodium 135 - 145 mmol/L 141 144 143  Potassium 3.5 - 5.1 mmol/L 3.6 3.4(L) 3.4(L)  Chloride 98 - 111 mmol/L 113(H) - 113(H)  CO2 22 - 32 mmol/L 22 - 22  Calcium 8.9 - 10.3 mg/dL 8.3(L) - 7.6(L)  Total Protein 6.5 - 8.1 g/dL - - 4.4(L)  Total Bilirubin 0.3 - 1.2 mg/dL - - 0.9  Alkaline Phos 38 - 126 U/L - - 59  AST 15 - 41 U/L - - 13(L)  ALT 0 - 44 U/L - - 11     Pertinent Imaging today Plain films and CT images have been personally visualized and interpreted; radiology reports have been reviewed. Decision making incorporated into the Impression / Recommendations. DG Chest 2 View  Result Date: 08/18/2021 CLINICAL DATA:  75 year old male with weakness, fever. EXAM: CHEST - 2 VIEW COMPARISON:  Chest radiographs 07/26/2020 and earlier. FINDINGS: Upright AP and lateral views of the chest. Lung volumes are within normal limits. Mediastinal contours are stable, mildly tortuous thoracic aorta. Visualized tracheal air column is within normal limits. Both lungs appear clear. No pneumothorax or pleural effusion. Chronic left lateral 5th rib fracture appears stable. No acute osseous abnormality identified. Gas and retained stool in visible bowel in the upper abdomen. IMPRESSION: No acute cardiopulmonary abnormality. Electronically Signed   By: Genevie Ann M.D.   On: 08/18/2021 11:52   DG Lumbar Spine 2-3 Views  Result Date:  08/20/2021 CLINICAL DATA:  Back pain EXAM: LUMBAR SPINE - 2-3 VIEW COMPARISON:  CT from previous day FINDINGS: Mild compression deformity of L2 with less than 20% loss of height anteriorly, no significant osseous retropulsion. Moderate compression deformity of L3 with anterior fragmentation, estimated 50% loss of height anteriorly, no significant osseous retropulsion. There is early kyphotic deformity in the upper lumbar spine. No spondylolisthesis. Patchy aortic calcifications without suggestion of aneurysm. IMPRESSION: 1. Stable L2 and L3 compression fracture deformities, indeterminate chronicity. MR lumbar spine or bone scintigraphy may be useful to differentiate subacute/unhealed from chronic deformities, if intervention is contemplated. 2.  Aortic Atherosclerosis (ICD10-170.0). Electronically Signed   By: Lucrezia Europe M.D.   On: 08/20/2021 11:36   CT HEAD WO CONTRAST (5MM)  Result Date: 08/22/2021 CLINICAL DATA:  Altered mental status. EXAM: CT HEAD WITHOUT CONTRAST TECHNIQUE: Contiguous axial images were obtained from the base of the skull through the vertex without intravenous contrast. COMPARISON:  August 19, 2021 FINDINGS: Brain: No evidence of acute infarction, hemorrhage, hydrocephalus, extra-axial collection or mass lesion/mass effect. Marked brain parenchymal volume loss and deep white matter microangiopathy. Vascular: No hyperdense vessel or unexpected calcification. Skull: Normal. Negative for fracture or focal lesion. Sinuses/Orbits: No acute finding. Other: None. IMPRESSION: 1. No acute intracranial abnormality. 2. Marked brain parenchymal atrophy and chronic microvascular disease. Electronically Signed   By: Fidela Salisbury M.D.   On: 08/22/2021 16:36   CT HEAD WO CONTRAST (5MM)  Result Date: 08/19/2021 CLINICAL DATA:  Facial trauma EXAM: CT HEAD WITHOUT CONTRAST TECHNIQUE: Contiguous axial images were obtained from the base of the skull through the vertex without intravenous contrast.  COMPARISON:  12/01/2020 FINDINGS: Brain: No evidence of acute infarction, hemorrhage, hydrocephalus, extra-axial collection or mass lesion/mass effect. Global cortical atrophy. Subcortical white matter and periventricular small vessel ischemic changes. Vascular: No hyperdense vessel or unexpected calcification. Skull: Normal. Negative for fracture or focal lesion. Sinuses/Orbits: The visualized paranasal sinuses are essentially clear. The mastoid air cells are unopacified. Other: None. IMPRESSION: No evidence of acute intracranial abnormality. Atrophy with small vessel ischemic changes. Electronically Signed   By: Julian Hy M.D.   On: 08/19/2021 01:33   CT CHEST WO CONTRAST  Result Date: 08/23/2021 CLINICAL DATA:  Fever of unknown origin. EXAM: CT CHEST WITHOUT CONTRAST TECHNIQUE: Multidetector CT imaging of the chest was performed following the standard protocol without IV contrast. COMPARISON:  None. FINDINGS: Cardiovascular: No acute findings. Aortic and coronary atherosclerotic calcification noted. Mediastinum/Nodes: No masses or pathologically enlarged lymph nodes identified on this unenhanced exam. Lungs/Pleura: No suspicious nodules or masses identified. No evidence of infiltrate or pleural effusion. Upper Abdomen:  Unremarkable. Musculoskeletal:  No suspicious bone lesions. IMPRESSION: No active disease within the thorax. Aortic Atherosclerosis (ICD10-I70.0). Electronically Signed   By: Marlaine Hind M.D.   On: 08/23/2021 12:42   DG Chest Port 1 View  Result Date: 08/22/2021 CLINICAL DATA:  Questionable sepsis. EXAM: PORTABLE CHEST 1 VIEW COMPARISON:  August 18, 2021. FINDINGS: Patient is rotated. Aortic atherosclerosis. Mediastinal contours or stable with mildly tortuous thoracic aorta. Normal size heart. Chronic senescent lung changes. Streaky bibasilar opacities are similar to prior. No visible pleural effusion or pneumothorax. The visualized skeletal structures are unchanged including  chronic left lateral fifth rib fracture. IMPRESSION: 1. No acute cardiopulmonary finding. 2.  Aortic Atherosclerosis (ICD10-I70.0). Electronically Signed   By: Dahlia Bailiff M.D.   On: 08/22/2021 15:25   ECHOCARDIOGRAM COMPLETE  Result Date: 08/23/2021    ECHOCARDIOGRAM REPORT   Patient Name:   Liberato Stansbery. Date of Exam: 08/23/2021 Medical Rec #:  [id number]           Height:       74.0 in Accession #:    [id number]          Weight:       160.0 lb Date of Birth:  Aug 10, 1946           BSA:          1.977 m Patient Age:    29 years            BP:           170/82 mmHg Patient Gender: M                   HR:           60 bpm. Exam Location:  Inpatient Procedure: 2D Echo Indications:    bacteremia  History:        Patient has prior history of Echocardiogram examinations, most                 recent 07/16/2020. Signs/Symptoms:Fever; Risk                 Factors:Dyslipidemia.  Sonographer:    Johny Chess RDCS Referring Phys: [phone] [facility] A [place]  Sonographer Comments: Image acquisition challenging due to uncooperative patient. IMPRESSIONS  1. Left ventricular ejection fraction, by estimation, is 60 to 65%. The left ventricle has normal function. The left ventricle has no regional wall motion abnormalities. Left ventricular diastolic parameters are consistent with Grade I diastolic dysfunction (impaired relaxation).  2. Right ventricular systolic function is normal. The right ventricular size is normal. Tricuspid regurgitation signal is inadequate for assessing PA pressure.  3. The mitral valve is normal in structure. Trivial mitral valve regurgitation. No evidence of mitral stenosis.  4. The aortic valve has an indeterminant number of cusps. Aortic valve regurgitation is not visualized. No aortic stenosis is present. FINDINGS  Left Ventricle: Left ventricular ejection fraction, by estimation, is 60 to 65%. The left ventricle has normal function. The left ventricle has no regional wall motion  abnormalities. The left ventricular internal cavity size was normal in  size. There is  no left ventricular hypertrophy. Left ventricular diastolic parameters are consistent with Grade I diastolic dysfunction (impaired relaxation). Normal left ventricular filling pressure. Right Ventricle: The right ventricular size is normal. Right vetricular wall thickness was not well visualized. Right ventricular systolic function is normal. Tricuspid regurgitation signal is inadequate for assessing PA pressure. Left Atrium: Left atrial size was normal in size. Right Atrium: Right atrial size was not well visualized. Pericardium: The pericardium was not well visualized. Mitral Valve: The mitral valve is normal in structure. Trivial mitral valve regurgitation. No evidence of mitral valve stenosis. Tricuspid Valve: The tricuspid valve is normal in structure. Tricuspid valve regurgitation is not demonstrated. No evidence of tricuspid stenosis. Aortic Valve: The aortic valve has an indeterminant number of cusps. Aortic valve regurgitation is not visualized. No aortic stenosis is present. Aortic valve mean gradient measures 5.4 mmHg. Aortic valve peak gradient measures 9.2 mmHg. Aortic valve area, by VTI measures 2.80 cm. Pulmonic Valve: The pulmonic valve was not well visualized. Pulmonic valve regurgitation is not visualized. No evidence of pulmonic stenosis. Aorta: The aortic root is normal in size and structure. IAS/Shunts: The interatrial septum was not well visualized.  LEFT VENTRICLE PLAX 2D LVIDd:         3.80 cm   Diastology LVIDs:         2.50 cm   LV e' medial:    5.44 cm/s LV PW:         0.80 cm   LV E/e' medial:  14.6 LV IVS:        0.90 cm   LV e' lateral:   7.72 cm/s LVOT diam:     2.00 cm   LV E/e' lateral: 10.3 LV SV:         110 LV SV Index:   56 LVOT Area:     3.14 cm  RIGHT VENTRICLE RV S prime:     11.50 cm/s LEFT ATRIUM             Index        RIGHT ATRIUM           Index LA diam:        3.20 cm 1.62 cm/m    RA Area:     11.40 cm LA Vol (A2C):   55.8 ml 28.23 ml/m  RA Volume:   25.50 ml  12.90 ml/m LA Vol (A4C):   47.5 ml 24.03 ml/m LA Biplane Vol: 52.4 ml 26.51 ml/m  AORTIC VALVE AV Area (Vmax):    3.24 cm AV Area (Vmean):   3.13 cm AV Area (VTI):     2.80 cm AV Vmax:           152.03 cm/s AV Vmean:          107.356 cm/s AV VTI:            0.393 m AV Peak Grad:      9.2 mmHg AV Mean Grad:      5.4 mmHg LVOT Vmax:         157.00 cm/s LVOT Vmean:        107.000 cm/s LVOT VTI:          0.351 m LVOT/AV VTI ratio: 0.89  AORTA Ao Root diam: 3.20 cm Ao Asc diam:  3.30 cm MITRAL VALVE MV Area (PHT): 2.26 cm    SHUNTS MV Decel Time: 335 msec    Systemic VTI:  0.35 m MV E velocity: 79.20 cm/s  Systemic  Diam: 2.00 cm MV A velocity: 98.80 cm/s MV E/A ratio:  0.80 Carlyle Dolly MD Electronically signed by Carlyle Dolly MD Signature Date/Time: 08/23/2021/4:13:36 PM    Final    CT Renal Stone Study  Result Date: 08/19/2021 CLINICAL DATA:  Flank pain EXAM: CT ABDOMEN AND PELVIS WITHOUT CONTRAST TECHNIQUE: Multidetector CT imaging of the abdomen and pelvis was performed following the standard protocol without IV contrast. COMPARISON:  08/13/2019 FINDINGS: LOWER CHEST: Normal. HEPATOBILIARY: Normal hepatic contours. No intra- or extrahepatic biliary dilatation. The gallbladder is normal. PANCREAS: Normal pancreas. No ductal dilatation or peripancreatic fluid collection. SPLEEN: Normal. ADRENALS/URINARY TRACT: The adrenal glands are normal. No hydronephrosis, nephroureterolithiasis or solid renal mass. Decompressed by Foley catheter. Thick-walled appearance may be due to under distension. STOMACH/BOWEL: There is no hiatal hernia. Normal duodenal course and caliber. No small bowel dilatation or inflammation. Massive stool ball in the rectum. The appendix is not visualized. No right lower quadrant inflammation or free fluid. VASCULAR/LYMPHATIC: There is calcific atherosclerosis of the abdominal aorta. No lymphadenopathy.  REPRODUCTIVE: Multiple vascular clips at the prostate. MUSCULOSKELETAL. Age indeterminate but likely chronic wedge compression fractures of L2 and L3. OTHER: None. IMPRESSION: 1. No obstructive uropathy or nephroureterolithiasis. 2. Massive stool ball in the rectum. 3. Age indeterminate but likely nonacute wedge compression fractures of L2 and L3. However, these are new since 08/13/2019. Aortic Atherosclerosis (ICD10-I70.0). Electronically Signed   By: Ulyses Jarred M.D.   On: 08/19/2021 04:06    TTE 08/23/21 IMPRESSIONS    1. Left ventricular ejection fraction, by estimation, is 60 to 65%. The  left ventricle has normal function. The left ventricle has no regional  wall motion abnormalities. Left ventricular diastolic parameters are  consistent with Grade I diastolic  dysfunction (impaired relaxation).   2. Right ventricular systolic function is normal. The right ventricular  size is normal. Tricuspid regurgitation signal is inadequate for assessing  PA pressure.   3. The mitral valve is normal in structure. Trivial mitral valve  regurgitation. No evidence of mitral stenosis.   4. The aortic valve has an indeterminant number of cusps. Aortic valve  regurgitation is not visualized. No aortic stenosis is present.    I spent more than 45 minutes for this patient encounter including review of prior medical records, coordination of care  with greater than 50% of time being face to face/counseling and discussing diagnostics/treatment plan with the patient/family.  Electronically signed by:   Rosiland Oz, MD Infectious Disease Physician [facility] Infectious Disease Pager: [phone]

## 2021-08-24 NOTE — Progress Notes (Signed)
Speech Language Pathology Treatment:    Patient Details Name: Mathew Miller. MRN: [id number] DOB: 1946-08-18 Today's Date: 08/24/2021 Time:  -     Chart reviewed and spoke with pt's wife (pt asleep) who reported pt's cognition has not changed given his advanced dementia. Agreed that full assessment not needed at this                                 Houston Siren  08/24/2021, 2:39 PM  Orbie Pyo Colvin Caroli.Ed Risk analyst [phone] Office [phone]

## 2021-08-24 NOTE — TOC Progression Note (Signed)
Transition of Care (TOC) - Progression Note    Patient Details  Name: Mathew Miller. MRN: [id number] Date of Birth: 29-Oct-1945  Transition of Care [facility]) CM/SW [place], [place] Phone Number: 08/24/2021, 11:26 AM  Clinical Narrative:    CSW followed up with pt's spouse to discuss DC plans. Pt's spouse is now agreeable to SNF and has a strong preference for [facility]. CSW will ask facility to take a look at referral. Pt has also been to [place] before and family stated that was not too far away. They would prefer he be in [place]. Spouse noted that pt has had boosters, unsure of how many, but states he got as many as was offered. The plan is for pt to return back home with spouse after rehab. CSW will send referral's to requested facilities and TOC will continue to follow for DC needs.   Expected Discharge Plan: [place] Barriers to Discharge: Continued Medical Work up, Ship broker, SNF Pending bed offer  Expected Discharge Plan and Services Expected Discharge Plan: [place] In-house Referral: Clinical Social Work   Post Acute Care Choice: [place] Living arrangements for the past 2 months: Single Family Home                                       Social Determinants of Health (SDOH) Interventions    Readmission Risk Interventions No flowsheet data found.

## 2021-08-24 NOTE — Progress Notes (Signed)
Mathew Miller [phone] [facility]) Hospital Liaison note:  Notified of request for [place] services. Will continue to follow for disposition.  Please call with any outpatient palliative questions or concerns.  Thank you for the opportunity to participate in this patient's care.  Thank you, Lorelee Market, LPN [facility] Liaison [phone]

## 2021-08-24 NOTE — Progress Notes (Signed)
pathologically enlarged lymph nodes identified on this unenhanced exam. Lungs/Pleura: No suspicious nodules or masses identified. No evidence of infiltrate or pleural effusion. Upper Abdomen:  Unremarkable. Musculoskeletal:  No suspicious bone lesions. IMPRESSION: No active disease within the thorax. Aortic Atherosclerosis (ICD10-I70.0). Electronically Signed   By: Marlaine Hind M.D.   On: 08/23/2021 12:42   ECHOCARDIOGRAM COMPLETE  Result Date: 08/23/2021    ECHOCARDIOGRAM REPORT   Patient Name:   Mathew Miller. Date of Exam: 08/23/2021 Medical Rec #:  [id number]           Height:       74.0 in Accession #:    [id number]          Weight:       160.0 lb Date of Birth:  1946/08/22           BSA:          1.977 m Patient Age:    75 years            BP:           170/82 mmHg Patient Gender: M                   HR:           60 bpm. Exam Location:  Inpatient Procedure: 2D Echo Indications:    bacteremia  History:        Patient has prior history of Echocardiogram examinations, most                 recent 07/16/2020. Signs/Symptoms:Fever; Risk                 Factors:Dyslipidemia.  Sonographer:    Johny Chess RDCS Referring Phys: [phone] [facility] A Ryosuke Ericksen  Sonographer Comments: Image acquisition challenging due to uncooperative patient. IMPRESSIONS  1. Left ventricular ejection fraction, by estimation, is 60 to 65%. The left ventricle has normal function. The left ventricle has no regional wall motion abnormalities. Left ventricular diastolic parameters are consistent with Grade I diastolic dysfunction (impaired relaxation).  2. Right ventricular systolic function is normal. The right ventricular size is normal. Tricuspid regurgitation signal is inadequate for assessing PA pressure.  3. The mitral valve is normal in structure. Trivial mitral valve regurgitation. No evidence of mitral stenosis.  4. The aortic valve has an indeterminant number of cusps. Aortic valve regurgitation is not visualized. No aortic  stenosis is present. FINDINGS  Left Ventricle: Left ventricular ejection fraction, by estimation, is 60 to 65%. The left ventricle has normal function. The left ventricle has no regional wall motion abnormalities. The left ventricular internal cavity size was normal in size. There is  no left ventricular hypertrophy. Left ventricular diastolic parameters are consistent with Grade I diastolic dysfunction (impaired relaxation). Normal left ventricular filling pressure. Right Ventricle: The right ventricular size is normal. Right vetricular wall thickness was not well visualized. Right ventricular systolic function is normal. Tricuspid regurgitation signal is inadequate for assessing PA pressure. Left Atrium: Left atrial size was normal in size. Right Atrium: Right atrial size was not well visualized. Pericardium: The pericardium was not well visualized. Mitral Valve: The mitral valve is normal in structure. Trivial mitral valve regurgitation. No evidence of mitral valve stenosis. Tricuspid Valve: The tricuspid valve is normal in structure. Tricuspid valve regurgitation is not demonstrated. No evidence of tricuspid stenosis. Aortic Valve: The aortic valve has an indeterminant number of cusps. Aortic valve regurgitation is not visualized. No aortic stenosis is present.  pathologically enlarged lymph nodes identified on this unenhanced exam. Lungs/Pleura: No suspicious nodules or masses identified. No evidence of infiltrate or pleural effusion. Upper Abdomen:  Unremarkable. Musculoskeletal:  No suspicious bone lesions. IMPRESSION: No active disease within the thorax. Aortic Atherosclerosis (ICD10-I70.0). Electronically Signed   By: Marlaine Hind M.D.   On: 08/23/2021 12:42   ECHOCARDIOGRAM COMPLETE  Result Date: 08/23/2021    ECHOCARDIOGRAM REPORT   Patient Name:   Mathew Miller. Date of Exam: 08/23/2021 Medical Rec #:  [id number]           Height:       74.0 in Accession #:    [id number]          Weight:       160.0 lb Date of Birth:  1946/08/22           BSA:          1.977 m Patient Age:    75 years            BP:           170/82 mmHg Patient Gender: M                   HR:           60 bpm. Exam Location:  Inpatient Procedure: 2D Echo Indications:    bacteremia  History:        Patient has prior history of Echocardiogram examinations, most                 recent 07/16/2020. Signs/Symptoms:Fever; Risk                 Factors:Dyslipidemia.  Sonographer:    Johny Chess RDCS Referring Phys: [phone] [facility] A Ryosuke Ericksen  Sonographer Comments: Image acquisition challenging due to uncooperative patient. IMPRESSIONS  1. Left ventricular ejection fraction, by estimation, is 60 to 65%. The left ventricle has normal function. The left ventricle has no regional wall motion abnormalities. Left ventricular diastolic parameters are consistent with Grade I diastolic dysfunction (impaired relaxation).  2. Right ventricular systolic function is normal. The right ventricular size is normal. Tricuspid regurgitation signal is inadequate for assessing PA pressure.  3. The mitral valve is normal in structure. Trivial mitral valve regurgitation. No evidence of mitral stenosis.  4. The aortic valve has an indeterminant number of cusps. Aortic valve regurgitation is not visualized. No aortic  stenosis is present. FINDINGS  Left Ventricle: Left ventricular ejection fraction, by estimation, is 60 to 65%. The left ventricle has normal function. The left ventricle has no regional wall motion abnormalities. The left ventricular internal cavity size was normal in size. There is  no left ventricular hypertrophy. Left ventricular diastolic parameters are consistent with Grade I diastolic dysfunction (impaired relaxation). Normal left ventricular filling pressure. Right Ventricle: The right ventricular size is normal. Right vetricular wall thickness was not well visualized. Right ventricular systolic function is normal. Tricuspid regurgitation signal is inadequate for assessing PA pressure. Left Atrium: Left atrial size was normal in size. Right Atrium: Right atrial size was not well visualized. Pericardium: The pericardium was not well visualized. Mitral Valve: The mitral valve is normal in structure. Trivial mitral valve regurgitation. No evidence of mitral valve stenosis. Tricuspid Valve: The tricuspid valve is normal in structure. Tricuspid valve regurgitation is not demonstrated. No evidence of tricuspid stenosis. Aortic Valve: The aortic valve has an indeterminant number of cusps. Aortic valve regurgitation is not visualized. No aortic stenosis is present.  pathologically enlarged lymph nodes identified on this unenhanced exam. Lungs/Pleura: No suspicious nodules or masses identified. No evidence of infiltrate or pleural effusion. Upper Abdomen:  Unremarkable. Musculoskeletal:  No suspicious bone lesions. IMPRESSION: No active disease within the thorax. Aortic Atherosclerosis (ICD10-I70.0). Electronically Signed   By: Marlaine Hind M.D.   On: 08/23/2021 12:42   ECHOCARDIOGRAM COMPLETE  Result Date: 08/23/2021    ECHOCARDIOGRAM REPORT   Patient Name:   Mathew Miller. Date of Exam: 08/23/2021 Medical Rec #:  [id number]           Height:       74.0 in Accession #:    [id number]          Weight:       160.0 lb Date of Birth:  1946/08/22           BSA:          1.977 m Patient Age:    75 years            BP:           170/82 mmHg Patient Gender: M                   HR:           60 bpm. Exam Location:  Inpatient Procedure: 2D Echo Indications:    bacteremia  History:        Patient has prior history of Echocardiogram examinations, most                 recent 07/16/2020. Signs/Symptoms:Fever; Risk                 Factors:Dyslipidemia.  Sonographer:    Johny Chess RDCS Referring Phys: [phone] [facility] A Ryosuke Ericksen  Sonographer Comments: Image acquisition challenging due to uncooperative patient. IMPRESSIONS  1. Left ventricular ejection fraction, by estimation, is 60 to 65%. The left ventricle has normal function. The left ventricle has no regional wall motion abnormalities. Left ventricular diastolic parameters are consistent with Grade I diastolic dysfunction (impaired relaxation).  2. Right ventricular systolic function is normal. The right ventricular size is normal. Tricuspid regurgitation signal is inadequate for assessing PA pressure.  3. The mitral valve is normal in structure. Trivial mitral valve regurgitation. No evidence of mitral stenosis.  4. The aortic valve has an indeterminant number of cusps. Aortic valve regurgitation is not visualized. No aortic  stenosis is present. FINDINGS  Left Ventricle: Left ventricular ejection fraction, by estimation, is 60 to 65%. The left ventricle has normal function. The left ventricle has no regional wall motion abnormalities. The left ventricular internal cavity size was normal in size. There is  no left ventricular hypertrophy. Left ventricular diastolic parameters are consistent with Grade I diastolic dysfunction (impaired relaxation). Normal left ventricular filling pressure. Right Ventricle: The right ventricular size is normal. Right vetricular wall thickness was not well visualized. Right ventricular systolic function is normal. Tricuspid regurgitation signal is inadequate for assessing PA pressure. Left Atrium: Left atrial size was normal in size. Right Atrium: Right atrial size was not well visualized. Pericardium: The pericardium was not well visualized. Mitral Valve: The mitral valve is normal in structure. Trivial mitral valve regurgitation. No evidence of mitral valve stenosis. Tricuspid Valve: The tricuspid valve is normal in structure. Tricuspid valve regurgitation is not demonstrated. No evidence of tricuspid stenosis. Aortic Valve: The aortic valve has an indeterminant number of cusps. Aortic valve regurgitation is not visualized. No aortic stenosis is present.  PROGRESS NOTE    Mathew Miller.  RSW:[id number] DOB: 09-02-1946 DOA: 08/22/2021 PCP: Unk Pinto, MD   Brief Narrative: 75 year old with past medical history significant for hypertension, HLD, stage IIIa CKD, advanced dementia, dysautonomia with postural hypotension, history of CVA, urinary retention with Foley catheter placement, presents with sepsis.  Recently admitted 11/1 on 11/3 for the same, started on antibiotics, no source was found he was discharged with indwelling Foley.  Patient presented with a recurrent fever, temp 105.  At baseline patient normally can transfer to the bathroom with a walker and help.  Recently she has been feeding him.  He is able to communicate with the morning of admission he was not speaking at all.  Patient presents with altered mental status, recurrent fever.  Assessment & Plan:   Principal Problem:   Fever Active Problems:   Hyperlipidemia   Goals of care, counseling/discussion   CVA (cerebral vascular accident) ([place])   Dementia ([place])   Prolonged QT interval   Acute metabolic encephalopathy   Staphylococcus epidermidis bacteremia   Malnutrition of moderate degree  1-Fever: Staph Epi Bacteremia.  Chest x-ray 11/5: No acute cardiopulmonary finding. COVID-19: Negative UA: More than 50 red blood cell, white blood cell 21-50 Urine culture: No growth to date.  Blood cultures: 2/2 growing staph epi  ID consulted. Recommend TTE. ECHO unrevealing.  CT chest negative.  Continue with Vancomycin.  Check ESR>  ID recommend to check Blood culture again 11/08 to determine if patient will need TEE. If persistent Bacteremia.   Acute metabolic encephalopathy;  Suspect related to fever.  Repeated CT head no acute intracranial abnormality.  On IV fluids for hydration.  Sleepy today, he dint sleep last night. [place] for patient to get Belsomra if family brigs med.   Stage III a CKD: Cr range 1.4.  Stable.   Advanced dementia: Palliative consulted  and following.  DNR status.  Plan for SNF with palliative.   Urinary retention: Continue with foley catheter.   History of CVA: Resume aspirin   Prolonged QT: Hold donepezil/  Repeat EKG today   Hypokalemia; replete    Recent CT renal; showed massive stool ball. Per wife patient has had BM since discharge.  Plan to schedule laxative.     Estimated body mass index is 20.01 kg/m as calculated from the following:   Height as of this encounter: $RemoveBeforeD'6\' 2"'NeumeOampOooAh$  (1.88 m).   Weight as of this encounter: 70.7 kg.   DVT prophylaxis: Lovenox Code Status: DNR Family Communication: Wife who was at bedside.  Disposition Plan:  Status is: Observation  The patient remains OBS appropriate and will d/c before 2 midnights.       Consultants:  ID  Procedures:  ECHO pending.   Antimicrobials:    Subjective: He is sleepy, per wife he dint sleep last night.   Objective: Vitals:   08/23/21 2020 08/24/21 0439 08/24/21 0729 08/24/21 1134  BP: 130/65 (!) 154/87 (!) 150/82   Pulse: (!) 56 (!) 55 (!) 55   Resp: $Remo'17 17 16   'ALXbC$ Temp: 98 F (36.7 C) 98.1 F (36.7 C) 97.7 F (36.5 C)   TempSrc: Oral Oral Oral   SpO2: 99% 94% 100%   Weight:    70.7 kg  Height:    '6\' 2"'$  (1.88 m)    Intake/Output Summary (Last 24 hours) at 08/24/2021 1605 Last data filed at 08/24/2021 0442 Gross per 24 hour  Intake 1226.8 ml  Output 775 ml  Net 451.8 ml  pathologically enlarged lymph nodes identified on this unenhanced exam. Lungs/Pleura: No suspicious nodules or masses identified. No evidence of infiltrate or pleural effusion. Upper Abdomen:  Unremarkable. Musculoskeletal:  No suspicious bone lesions. IMPRESSION: No active disease within the thorax. Aortic Atherosclerosis (ICD10-I70.0). Electronically Signed   By: Marlaine Hind M.D.   On: 08/23/2021 12:42   ECHOCARDIOGRAM COMPLETE  Result Date: 08/23/2021    ECHOCARDIOGRAM REPORT   Patient Name:   Mathew Miller. Date of Exam: 08/23/2021 Medical Rec #:  [id number]           Height:       74.0 in Accession #:    [id number]          Weight:       160.0 lb Date of Birth:  1946/08/22           BSA:          1.977 m Patient Age:    75 years            BP:           170/82 mmHg Patient Gender: M                   HR:           60 bpm. Exam Location:  Inpatient Procedure: 2D Echo Indications:    bacteremia  History:        Patient has prior history of Echocardiogram examinations, most                 recent 07/16/2020. Signs/Symptoms:Fever; Risk                 Factors:Dyslipidemia.  Sonographer:    Johny Chess RDCS Referring Phys: [phone] [facility] A Ryos[facility] Ericksen  Sonographer Comments: Image acquisition challenging due to uncooperative patient. IMPRESSIONS  1. Left ventricular ejection fraction, by estimation, is 60 to 65%. The left ventricle has normal function. The left ventricle has no regional wall motion abnormalities. Left ventricular diastolic parameters are consistent with Grade I diastolic dysfunction (impaired relaxation).  2. Right ventricular systolic function is normal. The right ventricular size is normal. Tricuspid regurgitation signal is inadequate for assessing PA pressure.  3. The mitral valve is normal in structure. Trivial mitral valve regurgitation. No evidence of mitral stenosis.  4. The aortic valve has an indeterminant number of cusps. Aortic valve regurgitation is not visualized. No aortic  stenosis is present. FINDINGS  Left Ventricle: Left ventricular ejection fraction, by estimation, is 60 to 65%. The left ventricle has normal function. The left ventricle has no regional wall motion abnormalities. The left ventricular internal cavity size was normal in size. There is  no left ventricular hypertrophy. Left ventricular diastolic parameters are consistent with Grade I diastolic dysfunction (impaired relaxation). Normal left ventricular filling pressure. Right Ventricle: The right ventricular size is normal. Right vetricular wall thickness was not well visualized. Right ventricular systolic function is normal. Tricuspid regurgitation signal is inadequate for assessing PA pressure. Left Atrium: Left atrial size was normal in size. Right Atrium: Right atrial size was not well visualized. Pericardium: The pericardium was not well visualized. Mitral Valve: The mitral valve is normal in structure. Trivial mitral valve regurgitation. No evidence of mitral valve stenosis. Tricuspid Valve: The tricuspid valve is normal in structure. Tricuspid valve regurgitation is not demonstrated. No evidence of tricuspid stenosis. Aortic Valve: The aortic valve has an indeterminant number of cusps. Aortic valve regurgitation is not visualized. No aortic stenosis is present.  PROGRESS NOTE    Mathew Miller.  RSW:[id number] DOB: 09-02-1946 DOA: 08/22/2021 PCP: Unk Pinto, MD   Brief Narrative: 75 year old with past medical history significant for hypertension, HLD, stage IIIa CKD, advanced dementia, dysautonomia with postural hypotension, history of CVA, urinary retention with Foley catheter placement, presents with sepsis.  Recently admitted 11/1 on 11/3 for the same, started on antibiotics, no source was found he was discharged with indwelling Foley.  Patient presented with a recurrent fever, temp 105.  At baseline patient normally can transfer to the bathroom with a walker and help.  Recently she has been feeding him.  He is able to communicate with the morning of admission he was not speaking at all.  Patient presents with altered mental status, recurrent fever.  Assessment & Plan:   Principal Problem:   Fever Active Problems:   Hyperlipidemia   Goals of care, counseling/discussion   CVA (cerebral vascular accident) ([place])   Dementia ([place])   Prolonged QT interval   Acute metabolic encephalopathy   Staphylococcus epidermidis bacteremia   Malnutrition of moderate degree  1-Fever: Staph Epi Bacteremia.  Chest x-ray 11/5: No acute cardiopulmonary finding. COVID-19: Negative UA: More than 50 red blood cell, white blood cell 21-50 Urine culture: No growth to date.  Blood cultures: 2/2 growing staph epi  ID consulted. Recommend TTE. ECHO unrevealing.  CT chest negative.  Continue with Vancomycin.  Check ESR>  ID recommend to check Blood culture again 11/08 to determine if patient will need TEE. If persistent Bacteremia.   Acute metabolic encephalopathy;  Suspect related to fever.  Repeated CT head no acute intracranial abnormality.  On IV fluids for hydration.  Sleepy today, he dint sleep last night. [place] for patient to get Belsomra if family brigs med.   Stage III a CKD: Cr range 1.4.  Stable.   Advanced dementia: Palliative consulted  and following.  DNR status.  Plan for SNF with palliative.   Urinary retention: Continue with foley catheter.   History of CVA: Resume aspirin   Prolonged QT: Hold donepezil/  Repeat EKG today   Hypokalemia; replete    Recent CT renal; showed massive stool ball. Per wife patient has had BM since discharge.  Plan to schedule laxative.     Estimated body mass index is 20.01 kg/m as calculated from the following:   Height as of this encounter: $RemoveBeforeD'6\' 2"'NeumeOampOooAh$  (1.88 m).   Weight as of this encounter: 70.7 kg.   DVT prophylaxis: Lovenox Code Status: DNR Family Communication: Wife who was at bedside.  Disposition Plan:  Status is: Observation  The patient remains OBS appropriate and will d/c before 2 midnights.       Consultants:  ID  Procedures:  ECHO pending.   Antimicrobials:    Subjective: He is sleepy, per wife he dint sleep last night.   Objective: Vitals:   08/23/21 2020 08/24/21 0439 08/24/21 0729 08/24/21 1134  BP: 130/65 (!) 154/87 (!) 150/82   Pulse: (!) 56 (!) 55 (!) 55   Resp: $Remo'17 17 16   'ALXbC$ Temp: 98 F (36.7 C) 98.1 F (36.7 C) 97.7 F (36.5 C)   TempSrc: Oral Oral Oral   SpO2: 99% 94% 100%   Weight:    70.7 kg  Height:    '6\' 2"'$  (1.88 m)    Intake/Output Summary (Last 24 hours) at 08/24/2021 1605 Last data filed at 08/24/2021 0442 Gross per 24 hour  Intake 1226.8 ml  Output 775 ml  Net 451.8 ml  PROGRESS NOTE    Mathew Miller.  RSW:[id number] DOB: 09-02-1946 DOA: 08/22/2021 PCP: Unk Pinto, MD   Brief Narrative: 75 year old with past medical history significant for hypertension, HLD, stage IIIa CKD, advanced dementia, dysautonomia with postural hypotension, history of CVA, urinary retention with Foley catheter placement, presents with sepsis.  Recently admitted 11/1 on 11/3 for the same, started on antibiotics, no source was found he was discharged with indwelling Foley.  Patient presented with a recurrent fever, temp 105.  At baseline patient normally can transfer to the bathroom with a walker and help.  Recently she has been feeding him.  He is able to communicate with the morning of admission he was not speaking at all.  Patient presents with altered mental status, recurrent fever.  Assessment & Plan:   Principal Problem:   Fever Active Problems:   Hyperlipidemia   Goals of care, counseling/discussion   CVA (cerebral vascular accident) ([place])   Dementia ([place])   Prolonged QT interval   Acute metabolic encephalopathy   Staphylococcus epidermidis bacteremia   Malnutrition of moderate degree  1-Fever: Staph Epi Bacteremia.  Chest x-ray 11/5: No acute cardiopulmonary finding. COVID-19: Negative UA: More than 50 red blood cell, white blood cell 21-50 Urine culture: No growth to date.  Blood cultures: 2/2 growing staph epi  ID consulted. Recommend TTE. ECHO unrevealing.  CT chest negative.  Continue with Vancomycin.  Check ESR>  ID recommend to check Blood culture again 11/08 to determine if patient will need TEE. If persistent Bacteremia.   Acute metabolic encephalopathy;  Suspect related to fever.  Repeated CT head no acute intracranial abnormality.  On IV fluids for hydration.  Sleepy today, he dint sleep last night. [place] for patient to get Belsomra if family brigs med.   Stage III a CKD: Cr range 1.4.  Stable.   Advanced dementia: Palliative consulted  and following.  DNR status.  Plan for SNF with palliative.   Urinary retention: Continue with foley catheter.   History of CVA: Resume aspirin   Prolonged QT: Hold donepezil/  Repeat EKG today   Hypokalemia; replete    Recent CT renal; showed massive stool ball. Per wife patient has had BM since discharge.  Plan to schedule laxative.     Estimated body mass index is 20.01 kg/m as calculated from the following:   Height as of this encounter: $RemoveBeforeD'6\' 2"'NeumeOampOooAh$  (1.88 m).   Weight as of this encounter: 70.7 kg.   DVT prophylaxis: Lovenox Code Status: DNR Family Communication: Wife who was at bedside.  Disposition Plan:  Status is: Observation  The patient remains OBS appropriate and will d/c before 2 midnights.       Consultants:  ID  Procedures:  ECHO pending.   Antimicrobials:    Subjective: He is sleepy, per wife he dint sleep last night.   Objective: Vitals:   08/23/21 2020 08/24/21 0439 08/24/21 0729 08/24/21 1134  BP: 130/65 (!) 154/87 (!) 150/82   Pulse: (!) 56 (!) 55 (!) 55   Resp: $Remo'17 17 16   'ALXbC$ Temp: 98 F (36.7 C) 98.1 F (36.7 C) 97.7 F (36.5 C)   TempSrc: Oral Oral Oral   SpO2: 99% 94% 100%   Weight:    70.7 kg  Height:    '6\' 2"'$  (1.88 m)    Intake/Output Summary (Last 24 hours) at 08/24/2021 1605 Last data filed at 08/24/2021 0442 Gross per 24 hour  Intake 1226.8 ml  Output 775 ml  Net 451.8 ml  PROGRESS NOTE    Mathew Miller.  RSW:[id number] DOB: 09-02-1946 DOA: 08/22/2021 PCP: Unk Pinto, MD   Brief Narrative: 75 year old with past medical history significant for hypertension, HLD, stage IIIa CKD, advanced dementia, dysautonomia with postural hypotension, history of CVA, urinary retention with Foley catheter placement, presents with sepsis.  Recently admitted 11/1 on 11/3 for the same, started on antibiotics, no source was found he was discharged with indwelling Foley.  Patient presented with a recurrent fever, temp 105.  At baseline patient normally can transfer to the bathroom with a walker and help.  Recently she has been feeding him.  He is able to communicate with the morning of admission he was not speaking at all.  Patient presents with altered mental status, recurrent fever.  Assessment & Plan:   Principal Problem:   Fever Active Problems:   Hyperlipidemia   Goals of care, counseling/discussion   CVA (cerebral vascular accident) ([place])   Dementia ([place])   Prolonged QT interval   Acute metabolic encephalopathy   Staphylococcus epidermidis bacteremia   Malnutrition of moderate degree  1-Fever: Staph Epi Bacteremia.  Chest x-ray 11/5: No acute cardiopulmonary finding. COVID-19: Negative UA: More than 50 red blood cell, white blood cell 21-50 Urine culture: No growth to date.  Blood cultures: 2/2 growing staph epi  ID consulted. Recommend TTE. ECHO unrevealing.  CT chest negative.  Continue with Vancomycin.  Check ESR>  ID recommend to check Blood culture again 11/08 to determine if patient will need TEE. If persistent Bacteremia.   Acute metabolic encephalopathy;  Suspect related to fever.  Repeated CT head no acute intracranial abnormality.  On IV fluids for hydration.  Sleepy today, he dint sleep last night. [place] for patient to get Belsomra if family brigs med.   Stage III a CKD: Cr range 1.4.  Stable.   Advanced dementia: Palliative consulted  and following.  DNR status.  Plan for SNF with palliative.   Urinary retention: Continue with foley catheter.   History of CVA: Resume aspirin   Prolonged QT: Hold donepezil/  Repeat EKG today   Hypokalemia; replete    Recent CT renal; showed massive stool ball. Per wife patient has had BM since discharge.  Plan to schedule laxative.     Estimated body mass index is 20.01 kg/m as calculated from the following:   Height as of this encounter: $RemoveBeforeD'6\' 2"'NeumeOampOooAh$  (1.88 m).   Weight as of this encounter: 70.7 kg.   DVT prophylaxis: Lovenox Code Status: DNR Family Communication: Wife who was at bedside.  Disposition Plan:  Status is: Observation  The patient remains OBS appropriate and will d/c before 2 midnights.       Consultants:  ID  Procedures:  ECHO pending.   Antimicrobials:    Subjective: He is sleepy, per wife he dint sleep last night.   Objective: Vitals:   08/23/21 2020 08/24/21 0439 08/24/21 0729 08/24/21 1134  BP: 130/65 (!) 154/87 (!) 150/82   Pulse: (!) 56 (!) 55 (!) 55   Resp: $Remo'17 17 16   'ALXbC$ Temp: 98 F (36.7 C) 98.1 F (36.7 C) 97.7 F (36.5 C)   TempSrc: Oral Oral Oral   SpO2: 99% 94% 100%   Weight:    70.7 kg  Height:    '6\' 2"'$  (1.88 m)    Intake/Output Summary (Last 24 hours) at 08/24/2021 1605 Last data filed at 08/24/2021 0442 Gross per 24 hour  Intake 1226.8 ml  Output 775 ml  Net 451.8 ml

## 2021-08-24 NOTE — Progress Notes (Signed)
Palliative Medicine Inpatient Follow Up Note  Consulting Provider: Karmen Bongo, MD   Reason for consult:     Answer  Palliative Care Consult Services Palliative Medicine Consult  Reason for Consult? goals of care    HPI:  Per intake H&P --> Mathew Miller. is a 75 y.o. male with medical history significant of HTN; HLD; stage 3a CKD; advanced dementia; dysautonomia with postural hypotension; h/o CVA; and urinary retention with foley catheter placement presenting with sepsis.   Palliative care was asked to get involved to further discuss goals of care.    On note, Palliative care last saw Mathew Miller on 07/18/2020 when he was admitted for right sided weakness and was found to have suffered a stroke.   Today's Discussion (08/24/2021):  *Please note that this is a verbal dictation therefore any spelling or grammatical errors are due to the "[employer]" system interpretation.  Chart reviewed. (+) MRSA bacteremia. ID involved.  I met with patients spouse, [name] this morning. We reviewed that Mathew Miller had a tough night and had very little sleep. Discussed that he was restless. We reviewed his present clinical situation. Bethena Roys vocalized continued hope for improvements.  We talked for quite sometime about Mathew Miller's decline since suffering a stroke last year. Bethena Roys shares that after the last hospital stay and this one he has declined physically and cognitively. We discussed the unfortunate reality of elderly men with an underlying dementia being hospitalized and the delirium associate with this. We reviewed that Mathew Miller may get to a new baseline of mental functioning as a result of this.   I offered therapeutic support through listening as Bethena Roys expresses the difficulties of caring for someone with dementia. We reviewed the cruel nature of this diease and how it transforms our loved ones into someone we are less familiar with.    I offered that having Mathew Miller go to a rehabilitation to see if  he can make improvements if very much within reason. We reviewed that if he declines as opposed to improves then it's truly a conversation about what are our goals at that time? I again offered that hospice would be the most appropriate next step if things do not evolve as we hope.   Questions and concerns addressed   Objective Assessment: Vital Signs Vitals:   08/24/21 0439 08/24/21 0729  BP: (!) 154/87 (!) 150/82  Pulse: (!) 55 (!) 55  Resp: 17 16  Temp: 98.1 F (36.7 C) 97.7 F (36.5 C)  SpO2: 94% 100%    Intake/Output Summary (Last 24 hours) at 08/24/2021 1053 Last data filed at 08/24/2021 0442 Gross per 24 hour  Intake 1226.8 ml  Output 775 ml  Net 451.8 ml   Gen:  Frail elderly M in NAD HEENT: moist mucous membranes CV: Regular rate and rhythm  PULM:  On RA ABD: soft/nontender  EXT: No edema  Neuro: Sleeping  SUMMARY OF RECOMMENDATIONS   DNAR/DNI   Plan for SNF placement   Appreciate TOC referral to liaison for hospice conversations ideally patient would be optimized from a strength perspective and transition to home with hospice after SNF stay   Ongoing incremental PMT support  Time Spent: 60 Greater than 50% of the time was spent in counseling and coordination of care ______________________________________________________________________________________ [place] Team Team Cell Phone: [phone] Please utilize secure chat with additional questions, if there is no response within 30 minutes please call the above phone number  Palliative Medicine Team providers are  available by phone from 7am to 7pm daily and can be reached through the team cell phone.  Should this patient require assistance outside of these hours, please call the patient's attending physician.

## 2021-08-25 ENCOUNTER — Inpatient Hospital Stay (HOSPITAL_COMMUNITY): Payer: Medicare HMO

## 2021-08-25 DIAGNOSIS — R7881 Bacteremia: Secondary | ICD-10-CM | POA: Diagnosis not present

## 2021-08-25 DIAGNOSIS — R509 Fever, unspecified: Secondary | ICD-10-CM | POA: Diagnosis not present

## 2021-08-25 DIAGNOSIS — R4182 Altered mental status, unspecified: Secondary | ICD-10-CM

## 2021-08-25 DIAGNOSIS — B957 Other staphylococcus as the cause of diseases classified elsewhere: Secondary | ICD-10-CM | POA: Diagnosis not present

## 2021-08-25 LAB — CBC
HCT: 35.6 % — ABNORMAL LOW (ref 39.0–52.0)
Hemoglobin: 12.4 g/dL — ABNORMAL LOW (ref 13.0–17.0)
MCH: 31.1 pg (ref 26.0–34.0)
MCHC: 34.8 g/dL (ref 30.0–36.0)
MCV: 89.2 fL (ref 80.0–100.0)
Platelets: 152 10*3/uL (ref 150–400)
RBC: 3.99 MIL/uL — ABNORMAL LOW (ref 4.22–5.81)
RDW: 13.5 % (ref 11.5–15.5)
WBC: 10.2 10*3/uL (ref 4.0–10.5)
nRBC: 0 % (ref 0.0–0.2)

## 2021-08-25 LAB — BASIC METABOLIC PANEL
Anion gap: 7 (ref 5–15)
BUN: 13 mg/dL (ref 8–23)
CO2: 24 mmol/L (ref 22–32)
Calcium: 8.4 mg/dL — ABNORMAL LOW (ref 8.9–10.3)
Chloride: 108 mmol/L (ref 98–111)
Creatinine, Ser: 1 mg/dL (ref 0.61–1.24)
GFR, Estimated: >60 mL/min (ref >60)
Glucose, Bld: 93 mg/dL (ref 70–99)
Potassium: 3.4 mmol/L — ABNORMAL LOW (ref 3.5–5.1)
Sodium: 139 mmol/L (ref 135–145)

## 2021-08-25 LAB — SEDIMENTATION RATE: Sed Rate: 10 mm/hr (ref 0–16)

## 2021-08-25 MED ORDER — POTASSIUM CHLORIDE 20 MEQ PO PACK
40.0000 meq | PACK | Freq: Two times a day (BID) | ORAL | Status: AC
  Administered 2021-08-25 (×2): 40 meq via ORAL
  Filled 2021-08-25 (×2): qty 2

## 2021-08-25 MED ORDER — BISACODYL 10 MG RE SUPP
10.0000 mg | Freq: Once | RECTAL | Status: AC
  Administered 2021-08-25: 10 mg via RECTAL
  Filled 2021-08-25: qty 1

## 2021-08-25 MED ORDER — INFLUENZA VAC A&B SA ADJ QUAD 0.5 ML IM PRSY
0.5000 mL | PREFILLED_SYRINGE | INTRAMUSCULAR | Status: AC
  Administered 2021-08-26: 0.5 mL via INTRAMUSCULAR
  Filled 2021-08-25 (×2): qty 0.5

## 2021-08-25 NOTE — Progress Notes (Signed)
[facility] Infectious Diseases Follow Up Note  Patient Identification: Patient Name: Mathew Miller. MRN: [id number] Admit Date: 08/22/2021  1:51 PM Age: 75 y.o.Today's Date: 08/25/2021  Reason for Visit: MRSE bacteremia   Principal Problem:   Fever Active Problems:   Hyperlipidemia   Goals of care, counseling/discussion   CVA (cerebral vascular accident) ([place])   Dementia ([place])   Prolonged QT interval   Acute metabolic encephalopathy   Staphylococcus epidermidis bacteremia   Malnutrition of moderate degree   Antibiotics: Vancomycin 11/5 - current                     Cefepime 11/5 -11/6   Lines/Hardwares: None   Interval Events: afebrile   Assessment MRSE bacteremia ( Both sets) with staph hominis ( only one set) - Blood cx has been updated with staph hominis in one of 2 sets. Staph hominis is most likely a contaminant. MRSE could be a contaminant as well but present in both sets and concerns for this attributing to his fever on presentation given unremarkable work up so far. Of note, he was recently admitted 11/1-11/3 for worsening mental status but unremarkable work up. hence, concerns for phlebitis but no such signs on exam. No known hardware. TTE unremarkable ( limited exam)  Fevers- only one episode, Per wife, also had 105F temperature at home. UA with greater than 50 RBCs, 21-50 WBCs. Urine cx no growth. Concern for bacteremia causing fevers.  He also had a recent CT renal study which was unremarkable except massive stool ball in the rectum. Cxray/CT head and Chest unremarkable. No reported diarrhea. No swelling of limbs  Advanced dementia with possible delirium  Multiple system atrophy/Ho CVA/dysautonomia with postural hypotension  Urinary retention s/p Foleys    Recommendations Continue Vancomycin, dosed per pharmacy  Fu repeat blood cx from today. If positive for MRSE, will consider TEE for r/o endocarditis.  Otherwise, plan to monitor off abtx Management of constipation per primary I will also order Duplex of Lower extremities to r/o DVT  Following   Rest of the management as per the primary team. Thank you for the consult. Please page with pertinent questions or concerns.  ______________________________________________________________________ Subjective patient seen and examined at the bedside. Spoke with wife at bedside who is feeding the patient. Wife says he had a temp of 55 F and he was all out at home before EMS arrived. He slept well last night and eating well. Patient stays silent and does not answer much.    Vitals BP (!) 165/92 (BP Location: Right Arm)   Pulse (!) 59   Temp 98.1 F (36.7 C) (Oral)   Resp 18   Ht 6\' 2"  (1.88 m)   Wt 70.7 kg Comment: bed scale  SpO2 99%   BMI 20.01 kg/m      Physical Exam Constitutional:  sitting up in bed, drowsy     Comments:   Cardiovascular:     Rate and Rhythm: Normal rate and regular rhythm.     Heart sounds:   Pulmonary:     Effort: Pulmonary effort is normal.     Comments:   Abdominal:     Palpations: Abdomen is soft.     Tenderness: Non tender and non distended   Musculoskeletal:        General: No swelling or tenderness.   Skin:    Comments: No signs of phlebitis on exam   Neurological:     General: uncooperative to exam due to  drowsiness and wanting to sleep   Psychiatric:        Mood and Affect: Confused    Pertinent Microbiology Results for orders placed or performed during the hospital encounter of 08/22/21  Resp Panel by RT-PCR (Flu A&B, Covid) Nasopharyngeal Swab     Status: None   Collection Time: 08/22/21  2:08 PM   Specimen: Nasopharyngeal Swab; Nasopharyngeal(NP) swabs in vial transport medium  Result Value Ref Range Status   SARS Coronavirus 2 by RT PCR NEGATIVE NEGATIVE Final    Comment: (NOTE) SARS-CoV-2 target nucleic acids are NOT DETECTED.  The SARS-CoV-2 RNA is generally detectable in  upper respiratory specimens during the acute phase of infection. The lowest concentration of SARS-CoV-2 viral copies this assay can detect is 138 copies/mL. A negative result does not preclude SARS-Cov-2 infection and should not be used as the sole basis for treatment or other patient management decisions. A negative result may occur with  improper specimen collection/handling, submission of specimen other than nasopharyngeal swab, presence of viral mutation(s) within the areas targeted by this assay, and inadequate number of viral copies(<138 copies/mL). A negative result must be combined with clinical observations, patient history, and epidemiological information. The expected result is Negative.  Fact Sheet for Patients:  EntrepreneurPulse.com.au  Fact Sheet for Healthcare Providers:  IncredibleEmployment.be  This test is no t yet approved or cleared by the [place] FDA and  has been authorized for detection and/or diagnosis of SARS-CoV-2 by FDA under an Emergency Use Authorization (EUA). This EUA will remain  in effect (meaning this test can be used) for the duration of the COVID-19 declaration under Section 564(b)(1) of the Act, 21 U.S.C.section 360bbb-3(b)(1), unless the authorization is terminated  or revoked sooner.       Influenza A by PCR NEGATIVE NEGATIVE Final   Influenza B by PCR NEGATIVE NEGATIVE Final    Comment: (NOTE) The Xpert Xpress SARS-CoV-2/FLU/RSV plus assay is intended as an aid in the diagnosis of influenza from Nasopharyngeal swab specimens and should not be used as a sole basis for treatment. Nasal washings and aspirates are unacceptable for Xpert Xpress SARS-CoV-2/FLU/RSV testing.  Fact Sheet for Patients: EntrepreneurPulse.com.au  Fact Sheet for Healthcare Providers: IncredibleEmployment.be  This test is not yet approved or cleared by the [place] FDA and has been  authorized for detection and/or diagnosis of SARS-CoV-2 by FDA under an Emergency Use Authorization (EUA). This EUA will remain in effect (meaning this test can be used) for the duration of the COVID-19 declaration under Section 564(b)(1) of the Act, 21 U.S.C. section 360bbb-3(b)(1), unless the authorization is terminated or revoked.  Performed at [facility], [place] [address], [place], [place] [address]   Urine Culture     Status: None   Collection Time: 08/22/21  2:08 PM   Specimen: In/Out Cath Urine  Result Value Ref Range Status   Specimen Description IN/OUT CATH URINE  Final   Special Requests NONE  Final   Culture   Final    NO GROWTH Performed at [facility], [place] [address] [place], [place] [address]    Report Status 08/23/2021 FINAL  Final  Blood Culture (routine x 2)     Status: Abnormal (Preliminary result)   Collection Time: 08/22/21  2:10 PM   Specimen: BLOOD LEFT ARM  Result Value Ref Range Status   Specimen Description BLOOD LEFT ARM  Final   Special Requests   Final    BOTTLES DRAWN AEROBIC AND ANAEROBIC Blood  Culture results may not be optimal due to an excessive volume of blood received in culture bottles   Culture  Setup Time   Final    GRAM POSITIVE COCCI IN CLUSTERS IN BOTH AEROBIC AND ANAEROBIC BOTTLES CRITICAL RESULT CALLED TO, READ BACK BY AND VERIFIED WITH: E,BREWINGTON PHARMD @1115  08/23/21 EB    Culture (A)  Final    STAPHYLOCOCCUS EPIDERMIDIS CULTURE REINCUBATED FOR BETTER GROWTH STAPHYLOCOCCUS HOMINIS THE SIGNIFICANCE OF ISOLATING THIS ORGANISM FROM A SINGLE SET OF BLOOD CULTURES WHEN MULTIPLE SETS ARE DRAWN IS UNCERTAIN. PLEASE NOTIFY THE MICROBIOLOGY DEPARTMENT WITHIN ONE WEEK IF SPECIATION AND SENSITIVITIES ARE REQUIRED. Performed at [facility], [place] [address], [place], [place] [address]    Report Status PENDING  Incomplete  Blood Culture ID Panel (Reflexed)     Status: Abnormal   Collection Time: 08/22/21  2:10 PM  Result  Value Ref Range Status   Enterococcus faecalis NOT DETECTED NOT DETECTED Final   Enterococcus Faecium NOT DETECTED NOT DETECTED Final   Listeria monocytogenes NOT DETECTED NOT DETECTED Final   Staphylococcus species DETECTED (A) NOT DETECTED Final    Comment: CRITICAL RESULT CALLED TO, READ BACK BY AND VERIFIED WITH: E,BREWINGTON PHARMD @1115  08/23/21 EB    Staphylococcus aureus (BCID) NOT DETECTED NOT DETECTED Final   Staphylococcus epidermidis DETECTED (A) NOT DETECTED Final    Comment: Methicillin (oxacillin) resistant coagulase negative staphylococcus. Possible blood culture contaminant (unless isolated from more than one blood culture draw or clinical case suggests pathogenicity). No antibiotic treatment is indicated for blood  culture contaminants. CRITICAL RESULT CALLED TO, READ BACK BY AND VERIFIED WITH: E,BREWINGTON PHARMD @1115  08/23/21 EB    Staphylococcus lugdunensis NOT DETECTED NOT DETECTED Final   Streptococcus species NOT DETECTED NOT DETECTED Final   Streptococcus agalactiae NOT DETECTED NOT DETECTED Final   Streptococcus pneumoniae NOT DETECTED NOT DETECTED Final   Streptococcus pyogenes NOT DETECTED NOT DETECTED Final   A.calcoaceticus-baumannii NOT DETECTED NOT DETECTED Final   Bacteroides fragilis NOT DETECTED NOT DETECTED Final   Enterobacterales NOT DETECTED NOT DETECTED Final   Enterobacter cloacae complex NOT DETECTED NOT DETECTED Final   Escherichia coli NOT DETECTED NOT DETECTED Final   Klebsiella aerogenes NOT DETECTED NOT DETECTED Final   Klebsiella oxytoca NOT DETECTED NOT DETECTED Final   Klebsiella pneumoniae NOT DETECTED NOT DETECTED Final   Proteus species NOT DETECTED NOT DETECTED Final   Salmonella species NOT DETECTED NOT DETECTED Final   Serratia marcescens NOT DETECTED NOT DETECTED Final   Haemophilus influenzae NOT DETECTED NOT DETECTED Final   Neisseria meningitidis NOT DETECTED NOT DETECTED Final   Pseudomonas aeruginosa NOT DETECTED NOT  DETECTED Final   Stenotrophomonas maltophilia NOT DETECTED NOT DETECTED Final   Candida albicans NOT DETECTED NOT DETECTED Final   Candida auris NOT DETECTED NOT DETECTED Final   Candida glabrata NOT DETECTED NOT DETECTED Final   Candida krusei NOT DETECTED NOT DETECTED Final   Candida parapsilosis NOT DETECTED NOT DETECTED Final   Candida tropicalis NOT DETECTED NOT DETECTED Final   Cryptococcus neoformans/gattii NOT DETECTED NOT DETECTED Final   Methicillin resistance mecA/C DETECTED (A) NOT DETECTED Final    Comment: CRITICAL RESULT CALLED TO, READ BACK BY AND VERIFIED WITH: E,BREWINGTON PHARMD @1115  08/23/21 EB Performed at [facility], [address], [place], [place] [address]   Blood Culture (routine x 2)     Status: Abnormal (Preliminary result)   Collection Time: 08/22/21  2:20 PM   Specimen: BLOOD RIGHT ARM  Result Value Ref Range Status   Specimen Description BLOOD RIGHT ARM  Final   Special Requests   Final    BOTTLES DRAWN AEROBIC AND ANAEROBIC Blood Culture adequate volume   Culture  Setup Time   Final    GRAM POSITIVE COCCI IN CLUSTERS IN BOTH AEROBIC AND ANAEROBIC BOTTLES CRITICAL VALUE NOTED.  VALUE IS CONSISTENT WITH PREVIOUSLY REPORTED AND CALLED VALUE. Performed at [facility], [place] [address], [place], [place] [address]    Culture STAPHYLOCOCCUS EPIDERMIDIS (A)  Final   Report Status PENDING  Incomplete    Pertinent Lab. CBC Latest Ref Rng & Units 08/25/2021 08/24/2021 08/23/2021  WBC 4.0 - 10.5 K/uL 10.2 11.4(H) 11.0(H)  Hemoglobin 13.0 - 17.0 g/dL 12.4(L) 13.2 12.5(L)  Hematocrit 39.0 - 52.0 % 35.6(L) 38.2(L) 37.1(L)  Platelets 150 - 400 K/uL 152 157 144(L)   CMP Latest Ref Rng & Units 08/24/2021 08/23/2021 08/22/2021  Glucose 70 - 99 mg/dL 99 91 -  BUN 8 - 23 mg/dL 20 23 -  Creatinine 0.61 - 1.24 mg/dL 1.00 1.28(H) -  Sodium 135 - 145 mmol/L 141 141 144  Potassium 3.5 - 5.1 mmol/L 3.3(L) 3.6 3.4(L)  Chloride 98 - 111 mmol/L 111 113(H) -  CO2  22 - 32 mmol/L 23 22 -  Calcium 8.9 - 10.3 mg/dL 8.6(L) 8.3(L) -  Total Protein 6.5 - 8.1 g/dL - - -  Total Bilirubin 0.3 - 1.2 mg/dL - - -  Alkaline Phos 38 - 126 U/L - - -  AST 15 - 41 U/L - - -  ALT 0 - 44 U/L - - -     Pertinent Imaging today Plain films and CT images have been personally visualized and interpreted; radiology reports have been reviewed. Decision making incorporated into the Impression / Recommendations.  TTE 08/23/21 IMPRESSIONS    1. Left ventricular ejection fraction, by estimation, is 60 to 65%. The  left ventricle has normal function. The left ventricle has no regional  wall motion abnormalities. Left ventricular diastolic parameters are  consistent with Grade I diastolic  dysfunction (impaired relaxation).   2. Right ventricular systolic function is normal. The right ventricular  size is normal. Tricuspid regurgitation signal is inadequate for assessing  PA pressure.   3. The mitral valve is normal in structure. Trivial mitral valve  regurgitation. No evidence of mitral stenosis.   4. The aortic valve has an indeterminant number of cusps. Aortic valve  regurgitation is not visualized. No aortic stenosis is present.    I spent more than 35 minutes for this patient encounter including review of prior medical records, coordination of care  with greater than 50% of time being face to face/counseling and discussing diagnostics/treatment plan with the patient/family.  Electronically signed by:   Rosiland Oz, MD Infectious Disease Physician [facility] Pager: [phone]

## 2021-08-25 NOTE — Progress Notes (Signed)
PROGRESS NOTE    Mathew Miller.  TFT:[id number] DOB: 07-16-1946 DOA: 08/22/2021 PCP: Lucky Cowboy, MD   Brief Narrative: 75 year old with past medical history significant for hypertension, HLD, stage IIIa CKD, advanced dementia, dysautonomia with postural hypotension, history of CVA, urinary retention with Foley catheter placement, presents with sepsis.  Recently admitted 11/1 on 11/3 for the same, started on antibiotics, no source was found he was discharged with indwelling Foley.  Patient presented with a recurrent fever, temp 105.  At baseline patient normally can transfer to the bathroom with a walker and help.  Recently she has been feeding him.  He is able to communicate with the morning of admission he was not speaking at all.  Patient presents with altered mental status, recurrent fever, he was found to have staph epi bacteremia.   Assessment & Plan:   Principal Problem:   Fever Active Problems:   Hyperlipidemia   Goals of care, counseling/discussion   CVA (cerebral vascular accident) (HCC)   Dementia (HCC)   Prolonged QT interval   Acute metabolic encephalopathy   Staphylococcus epidermidis bacteremia   Malnutrition of moderate degree  1-Fever: Staph Epi Bacteremi, staph Hominis  Chest x-ray 11/5: No acute cardiopulmonary finding. COVID-19: Negative UA: More than 50 red blood cell, white blood cell 21-50 Urine culture: No growth to date.  Blood cultures: 2/2 growing staph epi  ID consulted. Recommend TTE. ECHO unrevealing.  CT chest negative.  Continue with Vancomycin.  ESR>  ID recommend to check Blood culture again 11/08 to determine if patient will need TEE. If persistent Bacteremia. Follow blood culture results from 11/08.   Acute metabolic encephalopathy;  Suspect related to fever.  Repeated CT head no acute intracranial abnormality.  On IV fluids for hydration.  He is more alert today.   Stage III a CKD: Cr range 1.4.  Stable.   Advanced  dementia: Palliative consulted and following.  DNR status.  Plan for SNF with palliative.   Urinary retention: Continue with foley catheter.   History of CVA: Resume aspirin   Prolonged QT: Hold donepezil/  Repeat EKG today   Hypokalemia; replete    Recent CT renal; showed massive stool ball. Per wife patient has had BM since discharge.  Plan to schedule laxative.  No BM, suppository ordered.   Others; ID Doppler. Follow results.   Estimated body mass index is 20.01 kg/m as calculated from the following:   Height as of this encounter: 6\' 2"  (1.88 m).   Weight as of this encounter: 70.7 kg.   DVT prophylaxis: Lovenox Code Status: DNR Family Communication: Wife who was at bedside.  Disposition Plan:  Status is: Observation  The patient remains OBS appropriate and will d/c before 2 midnights.       Consultants:  ID  Procedures:  ECHO  Antimicrobials:    Subjective: He is alert, not really conversant per wife.  No BM since admission.   Objective: Vitals:   08/24/21 2013 08/24/21 2014 08/25/21 0419 08/25/21 0820  BP: (!) 161/85 (!) 161/85 (!) 165/92 (!) 159/69  Pulse: 70 71 (!) 59 (!) 51  Resp: 18 18  17   Temp: 98.5 F (36.9 C) 98.5 F (36.9 C) 98.1 F (36.7 C) 97.7 F (36.5 C)  TempSrc: Oral Oral Oral Oral  SpO2: 100% 100% 99% 99%  Weight:      Height:        Intake/Output Summary (Last 24 hours) at 08/25/2021 0840 Last data filed at 08/25/2021 0456 Gross per  24 hour  Intake 2027.38 ml  Output 1175 ml  Net 852.38 ml    Filed Weights   08/24/21 1134  Weight: 70.7 kg    Examination:  General exam: NAD Respiratory system: CTA Cardiovascular system: S 1, S 2 RRR Gastrointestinal system: BS present, soft, nt Central nervous system: Alert Extremities: No edema   Data Reviewed: I have personally reviewed following labs and imaging studies  CBC: Recent Labs  Lab 08/18/21 1107 08/19/21 0300 08/22/21 1358 08/22/21 1425  08/23/21 0721 08/24/21 0844 08/25/21 0718  WBC 18.3* 13.9* 9.5  --  11.0* 11.4* 10.2  NEUTROABS 16.6*  --  8.8*  --   --   --   --   HGB 15.4 15.2 12.5* 12.2* 12.5* 13.2 12.4*  HCT 46.1 48.0 36.9* 36.0* 37.1* 38.2* 35.6*  MCV 92.2 94.3 90.2  --  90.7 87.8 89.2  PLT 186 179 150  --  144* 157 152    Basic Metabolic Panel: Recent Labs  Lab 08/19/21 0300 08/22/21 1358 08/22/21 1425 08/23/21 0721 08/24/21 0844 08/25/21 0718  NA 141 143 144 141 141 139  K 4.2 3.4* 3.4* 3.6 3.3* 3.4*  CL 107 113*  --  113* 111 108  CO2 23 22  --  22 23 24   GLUCOSE 136* 117*  --  91 99 93  BUN 31* 24*  --  23 20 13   CREATININE 1.47* 1.45*  --  1.28* 1.00 1.00  CALCIUM 9.4 7.6*  --  8.3* 8.6* 8.4*    GFR: Estimated Creatinine Clearance: 61.9 mL/min (by C-G formula based on SCr of 1 mg/dL). Liver Function Tests: Recent Labs  Lab 08/18/21 1107 08/22/21 1358  AST 22 13*  ALT 12 11  ALKPHOS 82 59  BILITOT 1.4* 0.9  PROT 5.9* 4.4*  ALBUMIN 3.5 2.4*    No results for input(s): LIPASE, AMYLASE in the last 168 hours. No results for input(s): AMMONIA in the last 168 hours. Coagulation Profile: Recent Labs  Lab 08/19/21 0020 08/22/21 1358  INR 1.1 1.4*    Cardiac Enzymes: No results for input(s): CKTOTAL, CKMB, CKMBINDEX, TROPONINI in the last 168 hours. BNP (last 3 results) No results for input(s): PROBNP in the last 8760 hours. HbA1C: No results for input(s): HGBA1C in the last 72 hours. CBG: No results for input(s): GLUCAP in the last 168 hours. Lipid Profile: No results for input(s): CHOL, HDL, LDLCALC, TRIG, CHOLHDL, LDLDIRECT in the last 72 hours. Thyroid Function Tests: No results for input(s): TSH, T4TOTAL, FREET4, T3FREE, THYROIDAB in the last 72 hours. Anemia Panel: No results for input(s): VITAMINB12, FOLATE, FERRITIN, TIBC, IRON, RETICCTPCT in the last 72 hours. Sepsis Labs: Recent Labs  Lab 08/18/21 1107 08/19/21 0020 08/22/21 1410 08/22/21 1558  LATICACIDVEN  2.0* 2.4* 1.1 1.3     Recent Results (from the past 240 hour(s))  Urine Culture     Status: Abnormal   Collection Time: 08/18/21 11:54 PM   Specimen: In/Out Cath Urine  Result Value Ref Range Status   Specimen Description IN/OUT CATH URINE  Final   Special Requests   Final    Normal Performed at [facility], [address] [place], [place] [address]    Culture MULTIPLE SPECIES PRESENT, SUGGEST RECOLLECTION (A)  Final   Report Status 08/20/2021 FINAL  Final  Blood culture (routine single)     Status: None   Collection Time: 08/19/21 12:20 AM   Specimen: BLOOD  Result Value Ref Range Status   Specimen  Description BLOOD RIGHT ANTECUBITAL  Final   Special Requests   Final    BOTTLES DRAWN AEROBIC AND ANAEROBIC Blood Culture results may not be optimal due to an inadequate volume of blood received in culture bottles   Culture   Final    NO GROWTH 5 DAYS Performed at [facility], [address], [place], [place] [address]    Report Status 08/24/2021 FINAL  Final  Resp Panel by RT-PCR (Flu A&B, Covid) Nasopharyngeal Swab     Status: None   Collection Time: 08/19/21  5:03 AM   Specimen: Nasopharyngeal Swab; Nasopharyngeal(NP) swabs in vial transport medium  Result Value Ref Range Status   SARS Coronavirus 2 by RT PCR NEGATIVE NEGATIVE Final    Comment: (NOTE) SARS-CoV-2 target nucleic acids are NOT DETECTED.  The SARS-CoV-2 RNA is generally detectable in upper respiratory specimens during the acute phase of infection. The lowest concentration of SARS-CoV-2 viral copies this assay can detect is 138 copies/mL. A negative result does not preclude SARS-Cov-2 infection and should not be used as the sole basis for treatment or other patient management decisions. A negative result may occur with  improper specimen collection/handling, submission of specimen other than nasopharyngeal swab, presence of viral mutation(s) within the areas targeted by this assay, and inadequate  number of viral copies(<138 copies/mL). A negative result must be combined with clinical observations, patient history, and epidemiological information. The expected result is Negative.  Fact Sheet for Patients:  BloggerCourse.com  Fact Sheet for Healthcare Providers:  SeriousBroker.it  This test is no t yet approved or cleared by the [place] FDA and  has been authorized for detection and/or diagnosis of SARS-CoV-2 by FDA under an Emergency Use Authorization (EUA). This EUA will remain  in effect (meaning this test can be used) for the duration of the COVID-19 declaration under Section 564(b)(1) of the Act, 21 U.S.C.section 360bbb-3(b)(1), unless the authorization is terminated  or revoked sooner.       Influenza A by PCR NEGATIVE NEGATIVE Final   Influenza B by PCR NEGATIVE NEGATIVE Final    Comment: (NOTE) The Xpert Xpress SARS-CoV-2/FLU/RSV plus assay is intended as an aid in the diagnosis of influenza from Nasopharyngeal swab specimens and should not be used as a sole basis for treatment. Nasal washings and aspirates are unacceptable for Xpert Xpress SARS-CoV-2/FLU/RSV testing.  Fact Sheet for Patients: BloggerCourse.com  Fact Sheet for Healthcare Providers: SeriousBroker.it  This test is not yet approved or cleared by the [place] FDA and has been authorized for detection and/or diagnosis of SARS-CoV-2 by FDA under an Emergency Use Authorization (EUA). This EUA will remain in effect (meaning this test can be used) for the duration of the COVID-19 declaration under Section 564(b)(1) of the Act, 21 U.S.C. section 360bbb-3(b)(1), unless the authorization is terminated or revoked.  Performed at [facility], [address] [place], [place] [address]   Resp Panel by RT-PCR (Flu A&B, Covid) Nasopharyngeal Swab     Status: None   Collection Time: 08/22/21   2:08 PM   Specimen: Nasopharyngeal Swab; Nasopharyngeal(NP) swabs in vial transport medium  Result Value Ref Range Status   SARS Coronavirus 2 by RT PCR NEGATIVE NEGATIVE Final    Comment: (NOTE) SARS-CoV-2 target nucleic acids are NOT DETECTED.  The SARS-CoV-2 RNA is generally detectable in upper respiratory specimens during the acute phase of infection. The lowest concentration of SARS-CoV-2 viral copies this assay can detect is 138 copies/mL. A negative result does  not preclude SARS-Cov-2 infection and should not be used as the sole basis for treatment or other patient management decisions. A negative result may occur with  improper specimen collection/handling, submission of specimen other than nasopharyngeal swab, presence of viral mutation(s) within the areas targeted by this assay, and inadequate number of viral copies(<138 copies/mL). A negative result must be combined with clinical observations, patient history, and epidemiological information. The expected result is Negative.  Fact Sheet for Patients:  BloggerCourse.com  Fact Sheet for Healthcare Providers:  SeriousBroker.it  This test is no t yet approved or cleared by the [place] FDA and  has been authorized for detection and/or diagnosis of SARS-CoV-2 by FDA under an Emergency Use Authorization (EUA). This EUA will remain  in effect (meaning this test can be used) for the duration of the COVID-19 declaration under Section 564(b)(1) of the Act, 21 U.S.C.section 360bbb-3(b)(1), unless the authorization is terminated  or revoked sooner.       Influenza A by PCR NEGATIVE NEGATIVE Final   Influenza B by PCR NEGATIVE NEGATIVE Final    Comment: (NOTE) The Xpert Xpress SARS-CoV-2/FLU/RSV plus assay is intended as an aid in the diagnosis of influenza from Nasopharyngeal swab specimens and should not be used as a sole basis for treatment. Nasal washings and aspirates  are unacceptable for Xpert Xpress SARS-CoV-2/FLU/RSV testing.  Fact Sheet for Patients: BloggerCourse.com  Fact Sheet for Healthcare Providers: SeriousBroker.it  This test is not yet approved or cleared by the [place] FDA and has been authorized for detection and/or diagnosis of SARS-CoV-2 by FDA under an Emergency Use Authorization (EUA). This EUA will remain in effect (meaning this test can be used) for the duration of the COVID-19 declaration under Section 564(b)(1) of the Act, 21 U.S.C. section 360bbb-3(b)(1), unless the authorization is terminated or revoked.  Performed at [facility], [address], [place], [place] [address]   Urine Culture     Status: None   Collection Time: 08/22/21  2:08 PM   Specimen: In/Out Cath Urine  Result Value Ref Range Status   Specimen Description IN/OUT CATH URINE  Final   Special Requests NONE  Final   Culture   Final    NO GROWTH Performed at [facility], [address], [place], [place] [address]    Report Status 08/23/2021 FINAL  Final  Blood Culture (routine x 2)     Status: Abnormal (Preliminary result)   Collection Time: 08/22/21  2:10 PM   Specimen: BLOOD LEFT ARM  Result Value Ref Range Status   Specimen Description BLOOD LEFT ARM  Final   Special Requests   Final    BOTTLES DRAWN AEROBIC AND ANAEROBIC Blood Culture results may not be optimal due to an excessive volume of blood received in culture bottles   Culture  Setup Time   Final    GRAM POSITIVE COCCI IN CLUSTERS IN BOTH AEROBIC AND ANAEROBIC BOTTLES CRITICAL RESULT CALLED TO, READ BACK BY AND VERIFIED WITH: E,BREWINGTON PHARMD @1115  08/23/21 EB    Culture (A)  Final    STAPHYLOCOCCUS EPIDERMIDIS CULTURE REINCUBATED FOR BETTER GROWTH STAPHYLOCOCCUS HOMINIS THE SIGNIFICANCE OF ISOLATING THIS ORGANISM FROM A SINGLE SET OF BLOOD CULTURES WHEN MULTIPLE SETS ARE DRAWN IS UNCERTAIN. PLEASE NOTIFY THE  MICROBIOLOGY DEPARTMENT WITHIN ONE WEEK IF SPECIATION AND SENSITIVITIES ARE REQUIRED. Performed at [facility], [address] [place], [place] [address]    Report Status PENDING  Incomplete  Blood Culture ID Panel (Reflexed)  Status: Abnormal   Collection Time: 08/22/21  2:10 PM  Result Value Ref Range Status   Enterococcus faecalis NOT DETECTED NOT DETECTED Final   Enterococcus Faecium NOT DETECTED NOT DETECTED Final   Listeria monocytogenes NOT DETECTED NOT DETECTED Final   Staphylococcus species DETECTED (A) NOT DETECTED Final    Comment: CRITICAL RESULT CALLED TO, READ BACK BY AND VERIFIED WITH: E,BREWINGTON PHARMD @1115  08/23/21 EB    Staphylococcus aureus (BCID) NOT DETECTED NOT DETECTED Final   Staphylococcus epidermidis DETECTED (A) NOT DETECTED Final    Comment: Methicillin (oxacillin) resistant coagulase negative staphylococcus. Possible blood culture contaminant (unless isolated from more than one blood culture draw or clinical case suggests pathogenicity). No antibiotic treatment is indicated for blood  culture contaminants. CRITICAL RESULT CALLED TO, READ BACK BY AND VERIFIED WITH: E,BREWINGTON PHARMD @1115  08/23/21 EB    Staphylococcus lugdunensis NOT DETECTED NOT DETECTED Final   Streptococcus species NOT DETECTED NOT DETECTED Final   Streptococcus agalactiae NOT DETECTED NOT DETECTED Final   Streptococcus pneumoniae NOT DETECTED NOT DETECTED Final   Streptococcus pyogenes NOT DETECTED NOT DETECTED Final   A.calcoaceticus-baumannii NOT DETECTED NOT DETECTED Final   Bacteroides fragilis NOT DETECTED NOT DETECTED Final   Enterobacterales NOT DETECTED NOT DETECTED Final   Enterobacter cloacae complex NOT DETECTED NOT DETECTED Final   Escherichia coli NOT DETECTED NOT DETECTED Final   Klebsiella aerogenes NOT DETECTED NOT DETECTED Final   Klebsiella oxytoca NOT DETECTED NOT DETECTED Final   Klebsiella pneumoniae NOT DETECTED NOT DETECTED Final   Proteus species  NOT DETECTED NOT DETECTED Final   Salmonella species NOT DETECTED NOT DETECTED Final   Serratia marcescens NOT DETECTED NOT DETECTED Final   Haemophilus influenzae NOT DETECTED NOT DETECTED Final   Neisseria meningitidis NOT DETECTED NOT DETECTED Final   Pseudomonas aeruginosa NOT DETECTED NOT DETECTED Final   Stenotrophomonas maltophilia NOT DETECTED NOT DETECTED Final   Candida albicans NOT DETECTED NOT DETECTED Final   Candida auris NOT DETECTED NOT DETECTED Final   Candida glabrata NOT DETECTED NOT DETECTED Final   Candida krusei NOT DETECTED NOT DETECTED Final   Candida parapsilosis NOT DETECTED NOT DETECTED Final   Candida tropicalis NOT DETECTED NOT DETECTED Final   Cryptococcus neoformans/gattii NOT DETECTED NOT DETECTED Final   Methicillin resistance mecA/C DETECTED (A) NOT DETECTED Final    Comment: CRITICAL RESULT CALLED TO, READ BACK BY AND VERIFIED WITH: E,BREWINGTON PHARMD @1115  08/23/21 EB Performed at [facility], [address] [place], [place] [address]   Blood Culture (routine x 2)     Status: Abnormal (Preliminary result)   Collection Time: 08/22/21  2:20 PM   Specimen: BLOOD RIGHT ARM  Result Value Ref Range Status   Specimen Description BLOOD RIGHT ARM  Final   Special Requests   Final    BOTTLES DRAWN AEROBIC AND ANAEROBIC Blood Culture adequate volume   Culture  Setup Time   Final    GRAM POSITIVE COCCI IN CLUSTERS IN BOTH AEROBIC AND ANAEROBIC BOTTLES CRITICAL VALUE NOTED.  VALUE IS CONSISTENT WITH PREVIOUSLY REPORTED AND CALLED VALUE. Performed at [facility], [address], [place], [place] [address]    Culture STAPHYLOCOCCUS EPIDERMIDIS (A)  Final   Report Status PENDING  Incomplete          Radiology Studies: CT CHEST WO CONTRAST  Result Date: 08/23/2021 CLINICAL DATA:  Fever of unknown origin. EXAM: CT CHEST WITHOUT CONTRAST TECHNIQUE: Multidetector CT imaging of the chest was performed following the  standard protocol without IV  contrast. COMPARISON:  None. FINDINGS: Cardiovascular: No acute findings. Aortic and coronary atherosclerotic calcification noted. Mediastinum/Nodes: No masses or pathologically enlarged lymph nodes identified on this unenhanced exam. Lungs/Pleura: No suspicious nodules or masses identified. No evidence of infiltrate or pleural effusion. Upper Abdomen:  Unremarkable. Musculoskeletal:  No suspicious bone lesions. IMPRESSION: No active disease within the thorax. Aortic Atherosclerosis (ICD10-I70.0). Electronically Signed   By: Danae Orleans M.D.   On: 08/23/2021 12:42   ECHOCARDIOGRAM COMPLETE  Result Date: 08/23/2021    ECHOCARDIOGRAM REPORT   Patient Name:   Joesiah Mapstone. Date of Exam: 08/23/2021 Medical Rec #:  [id number]           Height:       74.0 in Accession #:    [id number]          Weight:       160.0 lb Date of Birth:  23-Jul-1946           BSA:          1.977 m Patient Age:    75 years            BP:           170/82 mmHg Patient Gender: M                   HR:           60 bpm. Exam Location:  Inpatient Procedure: 2D Echo Indications:    bacteremia  History:        Patient has prior history of Echocardiogram examinations, most                 recent 07/16/2020. Signs/Symptoms:Fever; Risk                 Factors:Dyslipidemia.  Sonographer:    Delcie Roch RDCS Referring Phys: [id number] [facility] A Emmalynn Pinkham  Sonographer Comments: Image acquisition challenging due to uncooperative patient. IMPRESSIONS  1. Left ventricular ejection fraction, by estimation, is 60 to 65%. The left ventricle has normal function. The left ventricle has no regional wall motion abnormalities. Left ventricular diastolic parameters are consistent with Grade I diastolic dysfunction (impaired relaxation).  2. Right ventricular systolic function is normal. The right ventricular size is normal. Tricuspid regurgitation signal is inadequate for assessing PA pressure.  3. The mitral valve is normal in structure. Trivial mitral valve  regurgitation. No evidence of mitral stenosis.  4. The aortic valve has an indeterminant number of cusps. Aortic valve regurgitation is not visualized. No aortic stenosis is present. FINDINGS  Left Ventricle: Left ventricular ejection fraction, by estimation, is 60 to 65%. The left ventricle has normal function. The left ventricle has no regional wall motion abnormalities. The left ventricular internal cavity size was normal in size. There is  no left ventricular hypertrophy. Left ventricular diastolic parameters are consistent with Grade I diastolic dysfunction (impaired relaxation). Normal left ventricular filling pressure. Right Ventricle: The right ventricular size is normal. Right vetricular wall thickness was not well visualized. Right ventricular systolic function is normal. Tricuspid regurgitation signal is inadequate for assessing PA pressure. Left Atrium: Left atrial size was normal in size. Right Atrium: Right atrial size was not well visualized. Pericardium: The pericardium was not well visualized. Mitral Valve: The mitral valve is normal in structure. Trivial mitral valve regurgitation. No evidence of mitral valve stenosis. Tricuspid Valve: The tricuspid valve is normal in structure. Tricuspid valve regurgitation is not demonstrated. No evidence of  tricuspid stenosis. Aortic Valve: The aortic valve has an indeterminant number of cusps. Aortic valve regurgitation is not visualized. No aortic stenosis is present. Aortic valve mean gradient measures 5.4 mmHg. Aortic valve peak gradient measures 9.2 mmHg. Aortic valve area, by VTI measures 2.80 cm. Pulmonic Valve: The pulmonic valve was not well visualized. Pulmonic valve regurgitation is not visualized. No evidence of pulmonic stenosis. Aorta: The aortic root is normal in size and structure. IAS/Shunts: The interatrial septum was not well visualized.  LEFT VENTRICLE PLAX 2D LVIDd:         3.80 cm   Diastology LVIDs:         2.50 cm   LV e' medial:    5.44  cm/s LV PW:         0.80 cm   LV E/e' medial:  14.6 LV IVS:        0.90 cm   LV e' lateral:   7.72 cm/s LVOT diam:     2.00 cm   LV E/e' lateral: 10.3 LV SV:         110 LV SV Index:   56 LVOT Area:     3.14 cm  RIGHT VENTRICLE RV S prime:     11.50 cm/s LEFT ATRIUM             Index        RIGHT ATRIUM           Index LA diam:        3.20 cm 1.62 cm/m   RA Area:     11.40 cm LA Vol (A2C):   55.8 ml 28.23 ml/m  RA Volume:   25.50 ml  12.90 ml/m LA Vol (A4C):   47.5 ml 24.03 ml/m LA Biplane Vol: 52.4 ml 26.51 ml/m  AORTIC VALVE AV Area (Vmax):    3.24 cm AV Area (Vmean):   3.13 cm AV Area (VTI):     2.80 cm AV Vmax:           152.03 cm/s AV Vmean:          107.356 cm/s AV VTI:            0.393 m AV Peak Grad:      9.2 mmHg AV Mean Grad:      5.4 mmHg LVOT Vmax:         157.00 cm/s LVOT Vmean:        107.000 cm/s LVOT VTI:          0.351 m LVOT/AV VTI ratio: 0.89  AORTA Ao Root diam: 3.20 cm Ao Asc diam:  3.30 cm MITRAL VALVE MV Area (PHT): 2.26 cm    SHUNTS MV Decel Time: 335 msec    Systemic VTI:  0.35 m MV E velocity: 79.20 cm/s  Systemic Diam: 2.00 cm MV A velocity: 98.80 cm/s MV E/A ratio:  0.80 Dina Rich MD Electronically signed by Dina Rich MD Signature Date/Time: 08/23/2021/4:13:36 PM    Final         Scheduled Meds:  aspirin EC  81 mg Oral Daily   bisacodyl  10 mg Rectal Once   Chlorhexidine Gluconate Cloth  6 each Topical Daily   enoxaparin (LOVENOX) injection  40 mg Subcutaneous Q24H   feeding supplement  237 mL Oral BID BM   multivitamin  1 tablet Oral QHS   polyethylene glycol  17 g Oral Daily   pyridostigmine  60 mg Oral BID   rosuvastatin  5 mg Oral Daily  senna  1 tablet Oral Daily   sodium chloride flush  3 mL Intravenous Q12H   Suvorexant  15 mg Oral QHS   Continuous Infusions:  lactated ringers 50 mL/hr at 08/24/21 1629   vancomycin 1,250 mg (08/24/21 1541)     LOS: 2 days    Time spent: 35 minutes.     Alba Cory, MD [facility]   If 7PM-7AM, please contact night-coverage [email or web address]  08/25/2021, 8:40 AM

## 2021-08-25 NOTE — Progress Notes (Signed)
Bilateral lower extremity venous duplex has been completed. Preliminary results can be found in CV Proc through chart review.   08/25/21 4:02 PM Mathew Miller RVT

## 2021-08-25 NOTE — TOC Progression Note (Signed)
Transition of Care (TOC) - Progression Note    Patient Details  Name: Giordan Fordham. MRN: [id number] Date of Birth: Jan 31, 1946  Transition of Care [facility]) CM/SW Contact  Joanne Chars, LCSW Phone Number: 08/25/2021, 1:15 PM  Clinical Narrative:   CSW dropped off list of bed offers to pt wife, she was speaking with chaplain, CSW did not stay.  She asked about [facility].  CSW checked with Olivia Mackie at [facility], no beds and no anticipated DC this week.  CSW spoke with pt wife by phone later, she again asked about [facility] and CSW informed her no beds.  She did not have choice document, asked about other options besides current offers. CSW reached out to [facility], [facility], Office manager and asked them to review.     1315: CSW dropped off choice document.    Expected Discharge Plan: [place] Barriers to Discharge: Continued Medical Work up, Ship broker, SNF Pending bed offer  Expected Discharge Plan and Services Expected Discharge Plan: [place] In-house Referral: Clinical Social Work   Post Acute Care Choice: [place] Living arrangements for the past 2 months: Single Family Home                                       Social Determinants of Health (SDOH) Interventions    Readmission Risk Interventions No flowsheet data found.

## 2021-08-25 NOTE — Progress Notes (Signed)
This chaplain responded to PMT consult for spiritual care. The chaplain understands the consult is for the Pt. wife-Judy.  Mathew Miller is resting on the sofa in the Pt. room. She shares she is still recovering from the Pt. restless night.  Mathew Miller is hopeful a bed offer will come from [facility] because of the proximity to home. SW-Greg delivered the Pt. bed offers and [facility] declined. Mathew Miller was able to talk through the important SNF characteristics with the chaplain in her discernment and is looking forward to F/U with SW.  The chaplain understands the family cat is her companion during the Pt. hospitalization. Through reflective listening the chaplain learned Mathew Miller has friends she can connect with and she is open to continued spiritual care.  Mathew Miller accepted intercessory prayer and F/U spiritual care.  Chaplain Sallyanne Kuster [phone]

## 2021-08-26 ENCOUNTER — Telehealth: Payer: Medicare HMO | Admitting: Pharmacist

## 2021-08-26 DIAGNOSIS — G9341 Metabolic encephalopathy: Secondary | ICD-10-CM | POA: Diagnosis not present

## 2021-08-26 DIAGNOSIS — I639 Cerebral infarction, unspecified: Secondary | ICD-10-CM | POA: Diagnosis not present

## 2021-08-26 DIAGNOSIS — R509 Fever, unspecified: Secondary | ICD-10-CM | POA: Diagnosis not present

## 2021-08-26 DIAGNOSIS — R7881 Bacteremia: Secondary | ICD-10-CM | POA: Diagnosis not present

## 2021-08-26 LAB — CBC
HCT: 35.9 % — ABNORMAL LOW (ref 39.0–52.0)
Hemoglobin: 12.2 g/dL — ABNORMAL LOW (ref 13.0–17.0)
MCH: 30.4 pg (ref 26.0–34.0)
MCHC: 34 g/dL (ref 30.0–36.0)
MCV: 89.5 fL (ref 80.0–100.0)
Platelets: 166 10*3/uL (ref 150–400)
RBC: 4.01 MIL/uL — ABNORMAL LOW (ref 4.22–5.81)
RDW: 13.5 % (ref 11.5–15.5)
WBC: 10.3 10*3/uL (ref 4.0–10.5)
nRBC: 0 % (ref 0.0–0.2)

## 2021-08-26 LAB — BASIC METABOLIC PANEL
Anion gap: 6 (ref 5–15)
BUN: 19 mg/dL (ref 8–23)
CO2: 26 mmol/L (ref 22–32)
Calcium: 8.2 mg/dL — ABNORMAL LOW (ref 8.9–10.3)
Chloride: 106 mmol/L (ref 98–111)
Creatinine, Ser: 1.08 mg/dL (ref 0.61–1.24)
GFR, Estimated: >60 mL/min (ref >60)
Glucose, Bld: 102 mg/dL — ABNORMAL HIGH (ref 70–99)
Potassium: 3.5 mmol/L (ref 3.5–5.1)
Sodium: 138 mmol/L (ref 135–145)

## 2021-08-26 LAB — CULTURE, BLOOD (ROUTINE X 2)

## 2021-08-26 LAB — MAGNESIUM: Magnesium: 1.8 mg/dL (ref 1.7–2.4)

## 2021-08-26 LAB — VANCOMYCIN, PEAK: Vancomycin Pk: 43 ug/mL — ABNORMAL HIGH (ref 30–40)

## 2021-08-26 MED ORDER — ACETAMINOPHEN 325 MG PO TABS
650.0000 mg | ORAL_TABLET | Freq: Four times a day (QID) | ORAL | Status: DC | PRN
  Administered 2021-08-27: 650 mg via ORAL
  Filled 2021-08-26: qty 2

## 2021-08-26 MED ORDER — BETHANECHOL CHLORIDE 10 MG PO TABS
10.0000 mg | ORAL_TABLET | Freq: Three times a day (TID) | ORAL | Status: DC
Start: 2021-08-26 — End: 2021-08-28
  Administered 2021-08-26 – 2021-08-28 (×5): 10 mg via ORAL
  Filled 2021-08-26 (×7): qty 1

## 2021-08-26 MED ORDER — ACETAMINOPHEN 325 MG PO TABS: 650.0000 mg | ORAL_TABLET | Freq: Four times a day (QID) | ORAL | Status: DC

## 2021-08-26 MED ORDER — POTASSIUM CHLORIDE CRYS ER 20 MEQ PO TBCR
40.0000 meq | EXTENDED_RELEASE_TABLET | Freq: Once | ORAL | Status: AC
  Administered 2021-08-26: 40 meq via ORAL
  Filled 2021-08-26: qty 2

## 2021-08-26 NOTE — Progress Notes (Signed)
ID Brief Note  Remains afebrile, no leukocytosis Susceptibilities for staph epidermidis from both sets on 11/5 has been requested Repeat blood cx on 11/8 no growth in 1 day Following   Rosiland Oz, MD Infectious Disease Physician [facility] [address] [place], [place] [address] Phone: [phone]  Fax: [phone]

## 2021-08-26 NOTE — Progress Notes (Signed)
Pt somnolent, briefly aroused to voice for only a few seconds. RR unlabored skin w/d less than 2 second cap refill. Pt wife at bedside states it is not unusual for pt to have episodes of decreased alertness similar to current somnolence. Pt wife assures this rn that this is pt baseline when he is "tired" This rn will continue to monitor pt for changes.

## 2021-08-26 NOTE — Progress Notes (Signed)
Occupational Therapy Treatment Patient Details Name: Mathew Miller. MRN: [id number] DOB: [birth year] Today's Date: 08/26/2021   History of present illness Mathew Miller. is a 75 y.o. male presenting with sepsis.  He was previously admitted from 11/1-3 with the same, started on antibiotics, but no source was found and he was discharged with indwelling foley.   Morning of this admission, he had a fever to 105.7; His wife reports that normally he can transfer to the bathroom with a walker and help.  He calls her when she leaves his sight.  He has been able to feed himself until the week leading up to last admission, but recently it has been easier for her to feed him.  He had a stroke a year ago and has had limited LUE strength;He usually is able to communicate but this AM of admission he did not speak at all. with medical history significant of HTN; HLD; stage 3a CKD; advanced dementia; dysautonomia with postural hypotension; h/o CVA; and urinary retention with foley catheter placement   OT comments  Pt unable to make significant progress towards OT goals during session. Remains limited primarily by cognition, as well as balance, activity tolerance, and strength. Pt requiring Total A +2 for lateral scoot transfer from drop arm recliner to bed. Pt also requiring Max A for grooming (see details below). Continue to recommend SNF. Will continue to follow.   Recommendations for follow up therapy are one component of a multi-disciplinary discharge planning process, led by the attending physician.  Recommendations may be updated based on patient status, additional functional criteria and insurance authorization.    Follow Up Recommendations  Skilled nursing-short term rehab (<3 hours/day)    Assistance Recommended at Discharge Frequent or constant Supervision/Assistance  Equipment Recommendations  None recommended by OT    Recommendations for Other Services      Precautions / Restrictions  Precautions Precautions: Fall Precaution Comments: Watch for signs of presyncope/syncope; history of orthostatic hypotension Restrictions Weight Bearing Restrictions: No       Mobility Bed Mobility Overal bed mobility: Needs Assistance Bed Mobility: Rolling Rolling: Total assist;+2 for physical assistance              Transfers Overall transfer level: Needs assistance   Transfers: Bed to chair/wheelchair/BSC            Lateral/Scoot Transfers: Total assist;+2 physical assistance;+2 safety/equipment General transfer comment: From drop arm recliner to bed.     Balance Overall balance assessment: Needs assistance Sitting-balance support: Bilateral upper extremity supported;Feet supported Sitting balance-Leahy Scale: Poor                                     ADL either performed or assessed with clinical judgement   ADL Overall ADL's : Needs assistance/impaired     Grooming: Wash/dry face;Maximal assistance;Bed level Grooming Details (indicate cue type and reason): Heavy Max A to wash face with hand-over-hand at bed level. Pt did provide some volitional movement to participate, however very slight.                                    Extremity/Trunk Assessment              Vision       Perception     Praxis      Cognition Arousal/Alertness:  Awake/alert Behavior During Therapy: Flat affect Overall Cognitive Status: Impaired/Different from baseline Area of Impairment: Orientation;Attention;Memory;Following commands;Safety/judgement;Awareness;Problem solving                 Orientation Level: Disoriented to;Place;Situation;Time       Safety/Judgement: Decreased awareness of safety;Decreased awareness of deficits   Problem Solving: Slow processing;Decreased initiation;Difficulty sequencing;Requires verbal cues;Requires tactile cues General Comments: Oriented to person. Difficulty communicating and inconsistant  yes/no answers. Does not initate any movement despite stating that he will.          Exercises     Shoulder Instructions       General Comments      Pertinent Vitals/ Pain       Pain Assessment: Faces Faces Pain Scale: Hurts little more Pain Location: RLE with movement Pain Descriptors / Indicators: Grimacing;Moaning Pain Intervention(s): Monitored during session;Repositioned;Limited activity within patient's tolerance  Home Living                                          Prior Functioning/Environment              Frequency  Min 2X/week        Progress Toward Goals  OT Goals(current goals can now be found in the care plan section)  Progress towards OT goals: Not progressing toward goals - comment  Acute Rehab OT Goals Patient Stated Goal: unable to state OT Goal Formulation: Patient unable to participate in goal setting Time For Goal Achievement: 09/06/21 Potential to Achieve Goals: Fair ADL Goals Pt Will Perform Grooming: with mod assist;sitting Pt Will Perform Upper Body Bathing: with mod assist;sitting Pt Will Perform Upper Body Dressing: with mod assist;sitting Pt Will Transfer to Toilet: with mod assist;stand pivot transfer;bedside commode Pt Will Perform Toileting - Clothing Manipulation and hygiene: with max assist;sit to/from stand  Plan Discharge plan remains appropriate;Frequency remains appropriate    Co-evaluation                 AM-PAC OT "6 Clicks" Daily Activity     Outcome Measure   Help from another person eating meals?: A Lot Help from another person taking care of personal grooming?: A Lot Help from another person toileting, which includes using toliet, bedpan, or urinal?: Total Help from another person bathing (including washing, rinsing, drying)?: Total Help from another person to put on and taking off regular upper body clothing?: Total Help from another person to put on and taking off regular lower body  clothing?: Total 6 Click Score: 8    End of Session    OT Visit Diagnosis: History of falling (Z91.81);Muscle weakness (generalized) (M62.81);Adult, failure to thrive (R62.7);Other symptoms and signs involving cognitive function   Activity Tolerance Treatment limited secondary to medical complications (Comment)   Patient Left in bed;with call bell/phone within reach;with bed alarm set;with family/visitor present   Nurse Communication Mobility status        Time: 4098-1191 OT Time Calculation (min): 22 min  Charges: OT General Charges $OT Visit: 1 Visit OT Treatments $Therapeutic Activity: 8-22 mins  Anetha Slagel C, OT/L  Acute Rehab [phone]  Lenice Llamas 08/26/2021, 4:28 PM

## 2021-08-26 NOTE — Progress Notes (Signed)
Pt fully alert confused and interactive, pt wife at bedside. Pt denies discomfort pt able to swallow pos well.

## 2021-08-26 NOTE — Progress Notes (Signed)
Culture (A)  Final    STAPHYLOCOCCUS EPIDERMIDIS CULTURE REINCUBATED FOR BETTER GROWTH Performed at [facility], [place] [address] [place], [place] [address]    Report Status PENDING  Incomplete  Culture, blood (routine x 2)     Status: None (Preliminary result)   Collection Time: 08/25/21  7:17 AM   Specimen: BLOOD  Result Value Ref Range Status   Specimen Description BLOOD RIGHT ANTECUBITAL  Final   Special Requests   Final    BOTTLES DRAWN AEROBIC AND ANAEROBIC Blood Culture adequate volume   Culture   Final    NO GROWTH 1 DAY Performed at [facility], [place] [address] [place],   [address]    Report Status PENDING  Incomplete  Culture, blood (routine x 2)     Status: None (Preliminary result)   Collection Time: 08/25/21  7:25 AM   Specimen: BLOOD  Result Value Ref Range Status   Specimen Description BLOOD RIGHT ANTECUBITAL  Final   Special Requests   Final    BOTTLES DRAWN AEROBIC AND ANAEROBIC Blood Culture adequate volume   Culture   Final    NO GROWTH 1 DAY Performed at [facility], [place] [address], [place],  [address]    Report Status PENDING  Incomplete    Radiology Studies: VAS [place] LOWER EXTREMITY VENOUS (DVT)  Result Date: 08/25/2021  Lower Venous DVT Study Patient Name:  Mathew Miller.  Date of Exam:   08/25/2021 Medical Rec #: [id number]            Accession #:    [id number] Date of Birth: 04-Sep-1946            Patient Gender: M Patient Age:   75 years Exam Location:  [facility] Procedure:      VAS [place] LOWER EXTREMITY VENOUS (DVT) Referring Phys: Rosiland Oz --------------------------------------------------------------------------------  Indications: Bacteremia.  Risk Factors: None identified. Limitations: Poor ultrasound/tissue interface and patient positioning, patient immobility. Comparison Study: No prior studies. Performing Technologist: Oliver Hum RVT  Examination Guidelines: A complete evaluation includes B-mode imaging, spectral Doppler, color Doppler, and power Doppler as needed of all accessible portions of each vessel. Bilateral testing is considered an integral part of a complete examination. Limited examinations for reoccurring indications may be performed as noted. The reflux portion of the exam is performed with the patient in reverse Trendelenburg.  +---------+---------------+---------+-----------+----------+--------------+ RIGHT    CompressibilityPhasicitySpontaneityPropertiesThrombus Aging +---------+---------------+---------+-----------+----------+--------------+ CFV      Full           Yes      Yes                                  +---------+---------------+---------+-----------+----------+--------------+ SFJ      Full                                                        +---------+---------------+---------+-----------+----------+--------------+ FV Prox  Full                                                        +---------+---------------+---------+-----------+----------+--------------+  PROGRESS NOTE  75   PCP: Unk Pinto, MD  Patient is from: Home.  Lives with his wife.  Wheelchair and rolling walker at baseline.  Dependent for most ADLs  DOA: 08/22/2021 LOS: 3  Chief complaints:  Chief Complaint  Patient presents with   Code Sepsis     Brief Narrative / Interim history: 75 year old M with PMH of advanced dementia, CKD-3A, HTN, HLD, dysautonomia with postural hypotension, CVA, urinary retention with chronic Foley and recent hospitalization from 11/1-11/3 for SIRS returning with fever to 105 at home, and found to have MRSE bacteremia in both sets of blood cultures sensitive to clindamycin, rifampin and vancomycin..  ID consulted.  TTE did not show vegetation.  Remains on IV vancomycin.  Therapy recommends SNF.  Subjective: Seen and examined earlier this morning.  No major events overnight of this morning.  Complains lower back pain from lying in bed.  Not able to elaborate further.  He is oriented to his first name.  Does not appear to be in distress.  Working on transfer from bed to chair with therapy.  Objective: Vitals:   08/25/21 2116 08/25/21 2259 08/26/21 0535 08/26/21 0748  BP: (!) 146/70 129/72 (!) 142/79 (!) 149/81  Pulse: 60 (!) 52 62 (!) 59  Resp: $Remo'18 17 17 16  'apcvh$ Temp: 98.2 F (36.8 C) 98.5 F (36.9 C) 98.2 F (36.8 C) 98 F (36.7 C)  TempSrc: Oral Axillary Oral Oral  SpO2: 99% 99% 99% 99%  Weight:      Height:        Intake/Output Summary (Last 24 hours) at 08/26/2021 1358 Last data filed at 08/26/2021 0535 Gross per 24 hour  Intake 1103.97 ml  Output 1325 ml  Net -221.03 ml   Filed Weights   08/24/21 1134  Weight: 70.7 kg    Examination:  GENERAL: Frail looking elderly male. HEENT: MMM.  Vision and hearing grossly intact.  NECK: Supple.  No apparent JVD.  RESP: On RA.  No IWOB.  Fair aeration bilaterally. CVS:  RRR. Heart sounds normal.  ABD/GI/GU: BS+. Abd soft, NTND.  MSK/EXT:   Moves extremities. No apparent deformity.  Trace edema in BLE. SKIN: 2 small spots of epidermal exfoliation likely from broken blister on his back. NEURO: Awake.  Oriented to his first name.  No apparent focal neuro deficit. PSYCH: Calm.  No distress or agitation.  Procedures:  None  Microbiology summarized: 11/5-COVID-19 and influenza PCR nonreactive. 11/5- blood cultures with MRSE in 2/2 bottles. 11/8-blood cultures NGTD.    Assessment & Plan: Methicillin-resistant staph epidermis bacteremia-reportedly febrile to 105 at home.  He was febrile to 101.9 in ED. remains afebrile since then.  Mild leukocytosis but resolved.  Culture data as above.  CXR and CT chest negative.  UA with WBC and RBC.  Urine culture NGTD.  TTE without vegetation.  ESR within normal. -Continue IV vancomycin -ID following.  Acute metabolic encephalopathy in patient with advanced dementia: Likely due to the above.  Repeat CT head without acute finding.  Seems to be fluctuating per patient's wife -Treat treatable causes. -Reorientation and delirium precautions. -Palliative medicine consulted and following. -Plan for palliative follow-up at SNF   CKD-3A: Renal function better than his baseline. Recent Labs    11/20/20 1420 01/20/21 1131 06/23/21 1701 08/18/21 1107 08/19/21 0300 08/22/21 1358 08/23/21 0721 08/24/21 0844 08/25/21 0718 08/26/21 0155  BUN 25 26* 23 31* 31* 24* $Remov'23 20 13 19  'IfXISr$ CREATININE 1.40* 1.41* 1.47*  Culture (A)  Final    STAPHYLOCOCCUS EPIDERMIDIS CULTURE REINCUBATED FOR BETTER GROWTH Performed at [facility], [place] [address] [place], [place] [address]    Report Status PENDING  Incomplete  Culture, blood (routine x 2)     Status: None (Preliminary result)   Collection Time: 08/25/21  7:17 AM   Specimen: BLOOD  Result Value Ref Range Status   Specimen Description BLOOD RIGHT ANTECUBITAL  Final   Special Requests   Final    BOTTLES DRAWN AEROBIC AND ANAEROBIC Blood Culture adequate volume   Culture   Final    NO GROWTH 1 DAY Performed at [facility], [place] [address] [place],   [address]    Report Status PENDING  Incomplete  Culture, blood (routine x 2)     Status: None (Preliminary result)   Collection Time: 08/25/21  7:25 AM   Specimen: BLOOD  Result Value Ref Range Status   Specimen Description BLOOD RIGHT ANTECUBITAL  Final   Special Requests   Final    BOTTLES DRAWN AEROBIC AND ANAEROBIC Blood Culture adequate volume   Culture   Final    NO GROWTH 1 DAY Performed at [facility], [place] [address], [place],  [address]    Report Status PENDING  Incomplete    Radiology Studies: VAS [place] LOWER EXTREMITY VENOUS (DVT)  Result Date: 08/25/2021  Lower Venous DVT Study Patient Name:  Mathew Miller.  Date of Exam:   08/25/2021 Medical Rec #: [id number]            Accession #:    [id number] Date of Birth: 04-Sep-1946            Patient Gender: M Patient Age:   75 years Exam Location:  [facility] Procedure:      VAS [place] LOWER EXTREMITY VENOUS (DVT) Referring Phys: Rosiland Oz --------------------------------------------------------------------------------  Indications: Bacteremia.  Risk Factors: None identified. Limitations: Poor ultrasound/tissue interface and patient positioning, patient immobility. Comparison Study: No prior studies. Performing Technologist: Oliver Hum RVT  Examination Guidelines: A complete evaluation includes B-mode imaging, spectral Doppler, color Doppler, and power Doppler as needed of all accessible portions of each vessel. Bilateral testing is considered an integral part of a complete examination. Limited examinations for reoccurring indications may be performed as noted. The reflux portion of the exam is performed with the patient in reverse Trendelenburg.  +---------+---------------+---------+-----------+----------+--------------+ RIGHT    CompressibilityPhasicitySpontaneityPropertiesThrombus Aging +---------+---------------+---------+-----------+----------+--------------+ CFV      Full           Yes      Yes                                  +---------+---------------+---------+-----------+----------+--------------+ SFJ      Full                                                        +---------+---------------+---------+-----------+----------+--------------+ FV Prox  Full                                                        +---------+---------------+---------+-----------+----------+--------------+  PROGRESS NOTE  77   PCP: Unk Pinto, MD  Patient is from: Home.  Lives with his wife.  Wheelchair and rolling walker at baseline.  Dependent for most ADLs  DOA: 08/22/2021 LOS: 3  Chief complaints:  Chief Complaint  Patient presents with   Code Sepsis     Brief Narrative / Interim history: 75 year old M with PMH of advanced dementia, CKD-3A, HTN, HLD, dysautonomia with postural hypotension, CVA, urinary retention with chronic Foley and recent hospitalization from 11/1-11/3 for SIRS returning with fever to 105 at home, and found to have MRSE bacteremia in both sets of blood cultures sensitive to clindamycin, rifampin and vancomycin..  ID consulted.  TTE did not show vegetation.  Remains on IV vancomycin.  Therapy recommends SNF.  Subjective: Seen and examined earlier this morning.  No major events overnight of this morning.  Complains lower back pain from lying in bed.  Not able to elaborate further.  He is oriented to his first name.  Does not appear to be in distress.  Working on transfer from bed to chair with therapy.  Objective: Vitals:   08/25/21 2116 08/25/21 2259 08/26/21 0535 08/26/21 0748  BP: (!) 146/70 129/72 (!) 142/79 (!) 149/81  Pulse: 60 (!) 52 62 (!) 59  Resp: $Remo'18 17 17 16  'apcvh$ Temp: 98.2 F (36.8 C) 98.5 F (36.9 C) 98.2 F (36.8 C) 98 F (36.7 C)  TempSrc: Oral Axillary Oral Oral  SpO2: 99% 99% 99% 99%  Weight:      Height:        Intake/Output Summary (Last 24 hours) at 08/26/2021 1358 Last data filed at 08/26/2021 0535 Gross per 24 hour  Intake 1103.97 ml  Output 1325 ml  Net -221.03 ml   Filed Weights   08/24/21 1134  Weight: 70.7 kg    Examination:  GENERAL: Frail looking elderly male. HEENT: MMM.  Vision and hearing grossly intact.  NECK: Supple.  No apparent JVD.  RESP: On RA.  No IWOB.  Fair aeration bilaterally. CVS:  RRR. Heart sounds normal.  ABD/GI/GU: BS+. Abd soft, NTND.  MSK/EXT:   Moves extremities. No apparent deformity.  Trace edema in BLE. SKIN: 2 small spots of epidermal exfoliation likely from broken blister on his back. NEURO: Awake.  Oriented to his first name.  No apparent focal neuro deficit. PSYCH: Calm.  No distress or agitation.  Procedures:  None  Microbiology summarized: 11/5-COVID-19 and influenza PCR nonreactive. 11/5- blood cultures with MRSE in 2/2 bottles. 11/8-blood cultures NGTD.    Assessment & Plan: Methicillin-resistant staph epidermis bacteremia-reportedly febrile to 105 at home.  He was febrile to 101.9 in ED. remains afebrile since then.  Mild leukocytosis but resolved.  Culture data as above.  CXR and CT chest negative.  UA with WBC and RBC.  Urine culture NGTD.  TTE without vegetation.  ESR within normal. -Continue IV vancomycin -ID following.  Acute metabolic encephalopathy in patient with advanced dementia: Likely due to the above.  Repeat CT head without acute finding.  Seems to be fluctuating per patient's wife -Treat treatable causes. -Reorientation and delirium precautions. -Palliative medicine consulted and following. -Plan for palliative follow-up at SNF   CKD-3A: Renal function better than his baseline. Recent Labs    11/20/20 1420 01/20/21 1131 06/23/21 1701 08/18/21 1107 08/19/21 0300 08/22/21 1358 08/23/21 0721 08/24/21 0844 08/25/21 0718 08/26/21 0155  BUN 25 26* 23 31* 31* 24* $Remov'23 20 13 19  'IfXISr$ CREATININE 1.40* 1.41* 1.47*  PROGRESS NOTE  77   PCP: Unk Pinto, MD  Patient is from: Home.  Lives with his wife.  Wheelchair and rolling walker at baseline.  Dependent for most ADLs  DOA: 08/22/2021 LOS: 3  Chief complaints:  Chief Complaint  Patient presents with   Code Sepsis     Brief Narrative / Interim history: 75 year old M with PMH of advanced dementia, CKD-3A, HTN, HLD, dysautonomia with postural hypotension, CVA, urinary retention with chronic Foley and recent hospitalization from 11/1-11/3 for SIRS returning with fever to 105 at home, and found to have MRSE bacteremia in both sets of blood cultures sensitive to clindamycin, rifampin and vancomycin..  ID consulted.  TTE did not show vegetation.  Remains on IV vancomycin.  Therapy recommends SNF.  Subjective: Seen and examined earlier this morning.  No major events overnight of this morning.  Complains lower back pain from lying in bed.  Not able to elaborate further.  He is oriented to his first name.  Does not appear to be in distress.  Working on transfer from bed to chair with therapy.  Objective: Vitals:   08/25/21 2116 08/25/21 2259 08/26/21 0535 08/26/21 0748  BP: (!) 146/70 129/72 (!) 142/79 (!) 149/81  Pulse: 60 (!) 52 62 (!) 59  Resp: $Remo'18 17 17 16  'apcvh$ Temp: 98.2 F (36.8 C) 98.5 F (36.9 C) 98.2 F (36.8 C) 98 F (36.7 C)  TempSrc: Oral Axillary Oral Oral  SpO2: 99% 99% 99% 99%  Weight:      Height:        Intake/Output Summary (Last 24 hours) at 08/26/2021 1358 Last data filed at 08/26/2021 0535 Gross per 24 hour  Intake 1103.97 ml  Output 1325 ml  Net -221.03 ml   Filed Weights   08/24/21 1134  Weight: 70.7 kg    Examination:  GENERAL: Frail looking elderly male. HEENT: MMM.  Vision and hearing grossly intact.  NECK: Supple.  No apparent JVD.  RESP: On RA.  No IWOB.  Fair aeration bilaterally. CVS:  RRR. Heart sounds normal.  ABD/GI/GU: BS+. Abd soft, NTND.  MSK/EXT:   Moves extremities. No apparent deformity.  Trace edema in BLE. SKIN: 2 small spots of epidermal exfoliation likely from broken blister on his back. NEURO: Awake.  Oriented to his first name.  No apparent focal neuro deficit. PSYCH: Calm.  No distress or agitation.  Procedures:  None  Microbiology summarized: 11/5-COVID-19 and influenza PCR nonreactive. 11/5- blood cultures with MRSE in 2/2 bottles. 11/8-blood cultures NGTD.    Assessment & Plan: Methicillin-resistant staph epidermis bacteremia-reportedly febrile to 105 at home.  He was febrile to 101.9 in ED. remains afebrile since then.  Mild leukocytosis but resolved.  Culture data as above.  CXR and CT chest negative.  UA with WBC and RBC.  Urine culture NGTD.  TTE without vegetation.  ESR within normal. -Continue IV vancomycin -ID following.  Acute metabolic encephalopathy in patient with advanced dementia: Likely due to the above.  Repeat CT head without acute finding.  Seems to be fluctuating per patient's wife -Treat treatable causes. -Reorientation and delirium precautions. -Palliative medicine consulted and following. -Plan for palliative follow-up at SNF   CKD-3A: Renal function better than his baseline. Recent Labs    11/20/20 1420 01/20/21 1131 06/23/21 1701 08/18/21 1107 08/19/21 0300 08/22/21 1358 08/23/21 0721 08/24/21 0844 08/25/21 0718 08/26/21 0155  BUN 25 26* 23 31* 31* 24* $Remov'23 20 13 19  'IfXISr$ CREATININE 1.40* 1.41* 1.47*  PROGRESS NOTE  77   PCP: Unk Pinto, MD  Patient is from: Home.  Lives with his wife.  Wheelchair and rolling walker at baseline.  Dependent for most ADLs  DOA: 08/22/2021 LOS: 3  Chief complaints:  Chief Complaint  Patient presents with   Code Sepsis     Brief Narrative / Interim history: 75 year old M with PMH of advanced dementia, CKD-3A, HTN, HLD, dysautonomia with postural hypotension, CVA, urinary retention with chronic Foley and recent hospitalization from 11/1-11/3 for SIRS returning with fever to 105 at home, and found to have MRSE bacteremia in both sets of blood cultures sensitive to clindamycin, rifampin and vancomycin..  ID consulted.  TTE did not show vegetation.  Remains on IV vancomycin.  Therapy recommends SNF.  Subjective: Seen and examined earlier this morning.  No major events overnight of this morning.  Complains lower back pain from lying in bed.  Not able to elaborate further.  He is oriented to his first name.  Does not appear to be in distress.  Working on transfer from bed to chair with therapy.  Objective: Vitals:   08/25/21 2116 08/25/21 2259 08/26/21 0535 08/26/21 0748  BP: (!) 146/70 129/72 (!) 142/79 (!) 149/81  Pulse: 60 (!) 52 62 (!) 59  Resp: $Remo'18 17 17 16  'apcvh$ Temp: 98.2 F (36.8 C) 98.5 F (36.9 C) 98.2 F (36.8 C) 98 F (36.7 C)  TempSrc: Oral Axillary Oral Oral  SpO2: 99% 99% 99% 99%  Weight:      Height:        Intake/Output Summary (Last 24 hours) at 08/26/2021 1358 Last data filed at 08/26/2021 0535 Gross per 24 hour  Intake 1103.97 ml  Output 1325 ml  Net -221.03 ml   Filed Weights   08/24/21 1134  Weight: 70.7 kg    Examination:  GENERAL: Frail looking elderly male. HEENT: MMM.  Vision and hearing grossly intact.  NECK: Supple.  No apparent JVD.  RESP: On RA.  No IWOB.  Fair aeration bilaterally. CVS:  RRR. Heart sounds normal.  ABD/GI/GU: BS+. Abd soft, NTND.  MSK/EXT:   Moves extremities. No apparent deformity.  Trace edema in BLE. SKIN: 2 small spots of epidermal exfoliation likely from broken blister on his back. NEURO: Awake.  Oriented to his first name.  No apparent focal neuro deficit. PSYCH: Calm.  No distress or agitation.  Procedures:  None  Microbiology summarized: 11/5-COVID-19 and influenza PCR nonreactive. 11/5- blood cultures with MRSE in 2/2 bottles. 11/8-blood cultures NGTD.    Assessment & Plan: Methicillin-resistant staph epidermis bacteremia-reportedly febrile to 105 at home.  He was febrile to 101.9 in ED. remains afebrile since then.  Mild leukocytosis but resolved.  Culture data as above.  CXR and CT chest negative.  UA with WBC and RBC.  Urine culture NGTD.  TTE without vegetation.  ESR within normal. -Continue IV vancomycin -ID following.  Acute metabolic encephalopathy in patient with advanced dementia: Likely due to the above.  Repeat CT head without acute finding.  Seems to be fluctuating per patient's wife -Treat treatable causes. -Reorientation and delirium precautions. -Palliative medicine consulted and following. -Plan for palliative follow-up at SNF   CKD-3A: Renal function better than his baseline. Recent Labs    11/20/20 1420 01/20/21 1131 06/23/21 1701 08/18/21 1107 08/19/21 0300 08/22/21 1358 08/23/21 0721 08/24/21 0844 08/25/21 0718 08/26/21 0155  BUN 25 26* 23 31* 31* 24* $Remov'23 20 13 19  'IfXISr$ CREATININE 1.40* 1.41* 1.47*  Culture (A)  Final    STAPHYLOCOCCUS EPIDERMIDIS CULTURE REINCUBATED FOR BETTER GROWTH Performed at [facility], [place] [address] [place], [place] [address]    Report Status PENDING  Incomplete  Culture, blood (routine x 2)     Status: None (Preliminary result)   Collection Time: 08/25/21  7:17 AM   Specimen: BLOOD  Result Value Ref Range Status   Specimen Description BLOOD RIGHT ANTECUBITAL  Final   Special Requests   Final    BOTTLES DRAWN AEROBIC AND ANAEROBIC Blood Culture adequate volume   Culture   Final    NO GROWTH 1 DAY Performed at [facility], [place] [address] [place],   [address]    Report Status PENDING  Incomplete  Culture, blood (routine x 2)     Status: None (Preliminary result)   Collection Time: 08/25/21  7:25 AM   Specimen: BLOOD  Result Value Ref Range Status   Specimen Description BLOOD RIGHT ANTECUBITAL  Final   Special Requests   Final    BOTTLES DRAWN AEROBIC AND ANAEROBIC Blood Culture adequate volume   Culture   Final    NO GROWTH 1 DAY Performed at [facility], [place] [address], [place],  [address]    Report Status PENDING  Incomplete    Radiology Studies: VAS [place] LOWER EXTREMITY VENOUS (DVT)  Result Date: 08/25/2021  Lower Venous DVT Study Patient Name:  Mathew Miller.  Date of Exam:   08/25/2021 Medical Rec #: [id number]            Accession #:    [id number] Date of Birth: 04-Sep-1946            Patient Gender: M Patient Age:   75 years Exam Location:  [facility] Procedure:      VAS [place] LOWER EXTREMITY VENOUS (DVT) Referring Phys: Rosiland Oz --------------------------------------------------------------------------------  Indications: Bacteremia.  Risk Factors: None identified. Limitations: Poor ultrasound/tissue interface and patient positioning, patient immobility. Comparison Study: No prior studies. Performing Technologist: Oliver Hum RVT  Examination Guidelines: A complete evaluation includes B-mode imaging, spectral Doppler, color Doppler, and power Doppler as needed of all accessible portions of each vessel. Bilateral testing is considered an integral part of a complete examination. Limited examinations for reoccurring indications may be performed as noted. The reflux portion of the exam is performed with the patient in reverse Trendelenburg.  +---------+---------------+---------+-----------+----------+--------------+ RIGHT    CompressibilityPhasicitySpontaneityPropertiesThrombus Aging +---------+---------------+---------+-----------+----------+--------------+ CFV      Full           Yes      Yes                                  +---------+---------------+---------+-----------+----------+--------------+ SFJ      Full                                                        +---------+---------------+---------+-----------+----------+--------------+ FV Prox  Full                                                        +---------+---------------+---------+-----------+----------+--------------+  PROGRESS NOTE  77   PCP: Unk Pinto, MD  Patient is from: Home.  Lives with his wife.  Wheelchair and rolling walker at baseline.  Dependent for most ADLs  DOA: 08/22/2021 LOS: 3  Chief complaints:  Chief Complaint  Patient presents with   Code Sepsis     Brief Narrative / Interim history: 75 year old M with PMH of advanced dementia, CKD-3A, HTN, HLD, dysautonomia with postural hypotension, CVA, urinary retention with chronic Foley and recent hospitalization from 11/1-11/3 for SIRS returning with fever to 105 at home, and found to have MRSE bacteremia in both sets of blood cultures sensitive to clindamycin, rifampin and vancomycin..  ID consulted.  TTE did not show vegetation.  Remains on IV vancomycin.  Therapy recommends SNF.  Subjective: Seen and examined earlier this morning.  No major events overnight of this morning.  Complains lower back pain from lying in bed.  Not able to elaborate further.  He is oriented to his first name.  Does not appear to be in distress.  Working on transfer from bed to chair with therapy.  Objective: Vitals:   08/25/21 2116 08/25/21 2259 08/26/21 0535 08/26/21 0748  BP: (!) 146/70 129/72 (!) 142/79 (!) 149/81  Pulse: 60 (!) 52 62 (!) 59  Resp: $Remo'18 17 17 16  'apcvh$ Temp: 98.2 F (36.8 C) 98.5 F (36.9 C) 98.2 F (36.8 C) 98 F (36.7 C)  TempSrc: Oral Axillary Oral Oral  SpO2: 99% 99% 99% 99%  Weight:      Height:        Intake/Output Summary (Last 24 hours) at 08/26/2021 1358 Last data filed at 08/26/2021 0535 Gross per 24 hour  Intake 1103.97 ml  Output 1325 ml  Net -221.03 ml   Filed Weights   08/24/21 1134  Weight: 70.7 kg    Examination:  GENERAL: Frail looking elderly male. HEENT: MMM.  Vision and hearing grossly intact.  NECK: Supple.  No apparent JVD.  RESP: On RA.  No IWOB.  Fair aeration bilaterally. CVS:  RRR. Heart sounds normal.  ABD/GI/GU: BS+. Abd soft, NTND.  MSK/EXT:   Moves extremities. No apparent deformity.  Trace edema in BLE. SKIN: 2 small spots of epidermal exfoliation likely from broken blister on his back. NEURO: Awake.  Oriented to his first name.  No apparent focal neuro deficit. PSYCH: Calm.  No distress or agitation.  Procedures:  None  Microbiology summarized: 11/5-COVID-19 and influenza PCR nonreactive. 11/5- blood cultures with MRSE in 2/2 bottles. 11/8-blood cultures NGTD.    Assessment & Plan: Methicillin-resistant staph epidermis bacteremia-reportedly febrile to 105 at home.  He was febrile to 101.9 in ED. remains afebrile since then.  Mild leukocytosis but resolved.  Culture data as above.  CXR and CT chest negative.  UA with WBC and RBC.  Urine culture NGTD.  TTE without vegetation.  ESR within normal. -Continue IV vancomycin -ID following.  Acute metabolic encephalopathy in patient with advanced dementia: Likely due to the above.  Repeat CT head without acute finding.  Seems to be fluctuating per patient's wife -Treat treatable causes. -Reorientation and delirium precautions. -Palliative medicine consulted and following. -Plan for palliative follow-up at SNF   CKD-3A: Renal function better than his baseline. Recent Labs    11/20/20 1420 01/20/21 1131 06/23/21 1701 08/18/21 1107 08/19/21 0300 08/22/21 1358 08/23/21 0721 08/24/21 0844 08/25/21 0718 08/26/21 0155  BUN 25 26* 23 31* 31* 24* $Remov'23 20 13 19  'IfXISr$ CREATININE 1.40* 1.41* 1.47*  Culture (A)  Final    STAPHYLOCOCCUS EPIDERMIDIS CULTURE REINCUBATED FOR BETTER GROWTH Performed at [facility], [place] [address] [place], [place] [address]    Report Status PENDING  Incomplete  Culture, blood (routine x 2)     Status: None (Preliminary result)   Collection Time: 08/25/21  7:17 AM   Specimen: BLOOD  Result Value Ref Range Status   Specimen Description BLOOD RIGHT ANTECUBITAL  Final   Special Requests   Final    BOTTLES DRAWN AEROBIC AND ANAEROBIC Blood Culture adequate volume   Culture   Final    NO GROWTH 1 DAY Performed at [facility], [place] [address] [place],   [address]    Report Status PENDING  Incomplete  Culture, blood (routine x 2)     Status: None (Preliminary result)   Collection Time: 08/25/21  7:25 AM   Specimen: BLOOD  Result Value Ref Range Status   Specimen Description BLOOD RIGHT ANTECUBITAL  Final   Special Requests   Final    BOTTLES DRAWN AEROBIC AND ANAEROBIC Blood Culture adequate volume   Culture   Final    NO GROWTH 1 DAY Performed at [facility], [place] [address], [place],  [address]    Report Status PENDING  Incomplete    Radiology Studies: VAS [place] LOWER EXTREMITY VENOUS (DVT)  Result Date: 08/25/2021  Lower Venous DVT Study Patient Name:  Mathew Miller.  Date of Exam:   08/25/2021 Medical Rec #: [id number]            Accession #:    [id number] Date of Birth: 04-Sep-1946            Patient Gender: M Patient Age:   75 years Exam Location:  [facility] Procedure:      VAS [place] LOWER EXTREMITY VENOUS (DVT) Referring Phys: Rosiland Oz --------------------------------------------------------------------------------  Indications: Bacteremia.  Risk Factors: None identified. Limitations: Poor ultrasound/tissue interface and patient positioning, patient immobility. Comparison Study: No prior studies. Performing Technologist: Oliver Hum RVT  Examination Guidelines: A complete evaluation includes B-mode imaging, spectral Doppler, color Doppler, and power Doppler as needed of all accessible portions of each vessel. Bilateral testing is considered an integral part of a complete examination. Limited examinations for reoccurring indications may be performed as noted. The reflux portion of the exam is performed with the patient in reverse Trendelenburg.  +---------+---------------+---------+-----------+----------+--------------+ RIGHT    CompressibilityPhasicitySpontaneityPropertiesThrombus Aging +---------+---------------+---------+-----------+----------+--------------+ CFV      Full           Yes      Yes                                  +---------+---------------+---------+-----------+----------+--------------+ SFJ      Full                                                        +---------+---------------+---------+-----------+----------+--------------+ FV Prox  Full                                                        +---------+---------------+---------+-----------+----------+--------------+

## 2021-08-26 NOTE — Progress Notes (Signed)
Pharmacy Antibiotic Note  Mathew Miller. is a 75 y.o. male admitted on 08/22/2021 with sepsis.  Pharmacy has been consulted for vancomycin dosing. Patient with MRSE bacteremia in both sets and staph hominis in one set only. Scr stable. Good UOP. Afebrile. WBC 10.3.   Plan: Continue vancomycin 1250mg  IV q24h  Obtain vancomycin peak and trough on 11/9 and 11/10 Monitor renal function, levels, and repeat blood cultures from 11/8 and ID recommendations  Height: 6\' 2"  (188 cm) Weight: 70.7 kg (155 lb 13.8 oz) (bed scale) IBW/kg (Calculated) : 82.2  Temp (24hrs), Avg:98.1 F (36.7 C), Min:97.6 F (36.4 C), Max:98.5 F (36.9 C)  Recent Labs  Lab 08/22/21 1358 08/22/21 1410 08/22/21 1558 08/23/21 0721 08/24/21 0844 08/25/21 0718 08/26/21 0155  WBC 9.5  --   --  11.0* 11.4* 10.2 10.3  CREATININE 1.45*  --   --  1.28* 1.00 1.00 1.08  LATICACIDVEN  --  1.1 1.3  --   --   --   --     Estimated Creatinine Clearance: 57.3 mL/min (by C-G formula based on SCr of 1.08 mg/dL).    Allergies  Allergen Reactions   Hydrocodone-Acetaminophen Nausea Only   Sudafed [Pseudoephedrine Hcl]     Urinary retention   Tetanus Toxoids     Itching, burning feeling, ran a fever    Antimicrobials this admission: Cefepime 11/5 >> 11/6 Vancomycin 11/5 >>  Dose adjustments this admission:  Microbiology results: 11/5 bcx: MRSE, staph hominis 11/8 bcx: ngtd   Thank you for allowing pharmacy to be a part of this patient's care.  Cristela Felt, PharmD, BCPS Clinical Pharmacist 08/26/2021 9:29 AM

## 2021-08-26 NOTE — Progress Notes (Signed)
Physical Therapy Treatment Patient Details Name: Mathew Miller. MRN: [id number] DOB: 10-08-1946 Today's Date: 08/26/2021   History of Present Illness Mathew Miller. is a 75 y.o. male presenting with sepsis.  He was previously admitted from 11/1-3 with the same, started on antibiotics, but no source was found and he was discharged with indwelling foley.   Morning of this admission, he had a fever to 105.7; His wife reports that normally he can transfer to the bathroom with a walker and help.  He calls her when she leaves his sight.  He has been able to feed himself until the week leading up to last admission, but recently it has been easier for her to feed him.  He had a stroke a year ago and has had limited LUE strength;He usually is able to communicate but this AM of admission he did not speak at all. with medical history significant of HTN; HLD; stage 3a CKD; advanced dementia; dysautonomia with postural hypotension; h/o CVA; and urinary retention with foley catheter placement    PT Comments    Emphasis of session on sitting balance, following commands, and OOB tolerance. Pt able to keep eyes open throughout session but limited communication perseverating on stating "what are we doing?" despite therapist explaining task. Pt did not initiate any movement with mobility during session despite verbal/tactile cueing. Pt performed bed mobility with total A +2 and required mod/max A with +2 for safety to maintain static sitting balance (LOB in all directions). Due to poor balance and difficulty following commands/sequencing, did not attempt to stand due to safety concerns. Transferred bed<>recliner via lateral scoot with total A+2 using bed pad and required max/total A to reposition in recliner. Acute PT to cont to follow.    Recommendations for follow up therapy are one component of a multi-disciplinary discharge planning process, led by the attending physician.  Recommendations may be updated  based on patient status, additional functional criteria and insurance authorization.  Follow Up Recommendations  Skilled nursing-short term rehab (<3 hours/day)     Assistance Recommended at Discharge Frequent or constant Supervision/Assistance  Equipment Recommendations  Wheelchair cushion (measurements PT);Hospital bed;Other (comment);Wheelchair (measurements PT)    Recommendations for Other Services OT consult     Precautions / Restrictions Precautions Precautions: Fall Precaution Comments: Watch for signs of presyncope/syncope; history of orthostatic hypotension Restrictions Weight Bearing Restrictions: No     Mobility  Bed Mobility Overal bed mobility: Needs Assistance Bed Mobility: Rolling Rolling: Max assist Sidelying to sit: +2 for physical assistance;Total assist;HOB elevated       General bed mobility comments: pt did not initate any movement and frequently stating "what are we doing?"    Transfers Overall transfer level: Needs assistance Equipment used: 2 person hand held assist Transfers: Bed to chair/wheelchair/BSC            Lateral/Scoot Transfers: Total assist;+2 physical assistance General transfer comment: Pt demosntrated anterior and L/R lateral lean with mild pushing to L. Utilized over the back transfer method to scoot bed<>recliner    Ambulation/Gait                   Marine scientist [employer] (Stroke Patients Only)       Balance Overall balance assessment: Needs assistance Sitting-balance support: Bilateral upper extremity supported;Feet supported Sitting balance-Leahy Scale: Poor Sitting balance - Comments: Pt requires mod/max A of 1 to maintain  static sitting balance. Pt demonstrates anterior and L/R lateral leans with osccasional pushing to L. Extreme forward head posture Postural control: Right lateral lean;Left lateral lean;Other (comment) (anterior lean)                                   Cognition Arousal/Alertness: Awake/alert Behavior During Therapy: Flat affect Overall Cognitive Status: Impaired/Different from baseline Area of Impairment: Orientation;Attention;Memory;Following commands;Safety/judgement;Awareness;Problem solving                 Orientation Level: Disoriented to;Place;Situation;Time       Safety/Judgement: Decreased awareness of safety;Decreased awareness of deficits   Problem Solving: Slow processing;Decreased initiation;Difficulty sequencing;Requires verbal cues;Requires tactile cues General Comments: Oriented to person. Difficulty communicating and inconsistant yes/no answers. Does not initate any movement despite stating that he will.        Exercises      General Comments General comments (skin integrity, edema, etc.): small skin tears on mid/upper back      Pertinent Vitals/Pain Pain Assessment: Faces Faces Pain Scale: Hurts a little bit (in RLE with movement) Pain Location: RLE with movement Pain Descriptors / Indicators: Grimacing;Moaning Pain Intervention(s): Limited activity within patient's tolerance;Monitored during session;Repositioned    Home Living                          Prior Function            PT Goals (current goals can now be found in the care plan section) Acute Rehab PT Goals Patient Stated Goal: Per Helaine Chess, NP with Palliative, goal is to optimize mobility and ADLs at SNF for rehab, with the goal of getting back home PT Goal Formulation: With family Time For Goal Achievement: 09/06/21 Potential to Achieve Goals: Fair Progress towards PT goals: Progressing toward goals    Frequency    Min 2X/week      PT Plan Current plan remains appropriate    Co-evaluation     PT goals addressed during session: Mobility/safety with mobility;Balance        AM-PAC PT "6 Clicks" Mobility   Outcome Measure  Help needed turning from your back to your side while in a  flat bed without using bedrails?: A Lot Help needed moving from lying on your back to sitting on the side of a flat bed without using bedrails?: Total Help needed moving to and from a bed to a chair (including a wheelchair)?: Total Help needed standing up from a chair using your arms (e.g., wheelchair or bedside chair)?: A Lot Help needed to walk in hospital room?: Total Help needed climbing 3-5 steps with a railing? : Total 6 Click Score: 8    End of Session Equipment Utilized During Treatment: Other (comment) (bed pad) Activity Tolerance: Patient tolerated treatment well Patient left: in chair;with call bell/phone within reach;with family/visitor present;Other (comment);with chair alarm set (RN aware of pt's current transfer status) Nurse Communication: Mobility status;Need for lift equipment PT Visit Diagnosis: Muscle weakness (generalized) (M62.81);Difficulty in walking, not elsewhere classified (R26.2);Pain Pain - Right/Left: Right Pain - part of body: Leg     Time: 4970-2637 PT Time Calculation (min) (ACUTE ONLY): 29 min  Charges:  $Therapeutic Activity: 23-37 mins                     Becky Sax PT, DPT  Blenda Nicely 08/26/2021, 11:09 AM

## 2021-08-26 NOTE — Care Management Important Message (Signed)
Important Message  Patient Details  Name: Mathew Miller. MRN: [id number] Date of Birth: 24-Jul-1946   Medicare Important Message Given:  Yes     Melina Mosteller 08/26/2021, 2:50 PM

## 2021-08-27 DIAGNOSIS — G9341 Metabolic encephalopathy: Secondary | ICD-10-CM | POA: Diagnosis not present

## 2021-08-27 DIAGNOSIS — E440 Moderate protein-calorie malnutrition: Secondary | ICD-10-CM

## 2021-08-27 DIAGNOSIS — F03C0 Unspecified dementia, severe, without behavioral disturbance, psychotic disturbance, mood disturbance, and anxiety: Secondary | ICD-10-CM

## 2021-08-27 DIAGNOSIS — R4182 Altered mental status, unspecified: Secondary | ICD-10-CM | POA: Diagnosis not present

## 2021-08-27 DIAGNOSIS — B957 Other staphylococcus as the cause of diseases classified elsewhere: Secondary | ICD-10-CM | POA: Diagnosis not present

## 2021-08-27 DIAGNOSIS — E782 Mixed hyperlipidemia: Secondary | ICD-10-CM

## 2021-08-27 DIAGNOSIS — R509 Fever, unspecified: Secondary | ICD-10-CM | POA: Diagnosis not present

## 2021-08-27 DIAGNOSIS — R5381 Other malaise: Secondary | ICD-10-CM

## 2021-08-27 DIAGNOSIS — E876 Hypokalemia: Secondary | ICD-10-CM

## 2021-08-27 DIAGNOSIS — Z0000 Encounter for general adult medical examination without abnormal findings: Secondary | ICD-10-CM | POA: Diagnosis not present

## 2021-08-27 DIAGNOSIS — R338 Other retention of urine: Secondary | ICD-10-CM

## 2021-08-27 DIAGNOSIS — I639 Cerebral infarction, unspecified: Secondary | ICD-10-CM

## 2021-08-27 DIAGNOSIS — R7881 Bacteremia: Secondary | ICD-10-CM | POA: Diagnosis not present

## 2021-08-27 LAB — RENAL FUNCTION PANEL
Albumin: 2.4 g/dL — ABNORMAL LOW (ref 3.5–5.0)
Anion gap: 5 (ref 5–15)
BUN: 16 mg/dL (ref 8–23)
CO2: 24 mmol/L (ref 22–32)
Calcium: 8.5 mg/dL — ABNORMAL LOW (ref 8.9–10.3)
Chloride: 109 mmol/L (ref 98–111)
Creatinine, Ser: 0.98 mg/dL (ref 0.61–1.24)
GFR, Estimated: >60 mL/min (ref >60)
Glucose, Bld: 114 mg/dL — ABNORMAL HIGH (ref 70–99)
Phosphorus: 2.6 mg/dL (ref 2.5–4.6)
Potassium: 3.7 mmol/L (ref 3.5–5.1)
Sodium: 138 mmol/L (ref 135–145)

## 2021-08-27 LAB — CBC
HCT: 37.2 % — ABNORMAL LOW (ref 39.0–52.0)
Hemoglobin: 12.6 g/dL — ABNORMAL LOW (ref 13.0–17.0)
MCH: 30.4 pg (ref 26.0–34.0)
MCHC: 33.9 g/dL (ref 30.0–36.0)
MCV: 89.9 fL (ref 80.0–100.0)
Platelets: 172 10*3/uL (ref 150–400)
RBC: 4.14 MIL/uL — ABNORMAL LOW (ref 4.22–5.81)
RDW: 13.8 % (ref 11.5–15.5)
WBC: 10 10*3/uL (ref 4.0–10.5)
nRBC: 0 % (ref 0.0–0.2)

## 2021-08-27 LAB — CK: Total CK: 32 U/L — ABNORMAL LOW (ref 49–397)

## 2021-08-27 LAB — TSH: TSH: 2.697 u[IU]/mL (ref 0.350–4.500)

## 2021-08-27 LAB — AMMONIA: Ammonia: 21 umol/L (ref 9–35)

## 2021-08-27 LAB — MAGNESIUM: Magnesium: 1.9 mg/dL (ref 1.7–2.4)

## 2021-08-27 NOTE — Progress Notes (Signed)
This chaplain is present for F/U spiritual care with the Pt. Wife-Judy.  Bethena Roys is not at the bedside.  This chaplain will F/U at another time.  Chaplain Sallyanne Kuster [phone]

## 2021-08-27 NOTE — Plan of Care (Signed)
Patient ID: Don Tiu., male   DOB: [birth year], 75 y.o.   MRN: [id number]  Problem: Education: Goal: Knowledge of General Education information will improve Description: Including pain rating scale, medication(s)/side effects and non-pharmacologic comfort measures Outcome: Progressing   Problem: Health Behavior/Discharge Planning: Goal: Ability to manage health-related needs will improve Outcome: Progressing   Problem: Clinical Measurements: Goal: Ability to maintain clinical measurements within normal limits will improve Outcome: Progressing Goal: Will remain free from infection Outcome: Progressing Goal: Diagnostic test results will improve Outcome: Progressing Goal: Respiratory complications will improve Outcome: Progressing Goal: Cardiovascular complication will be avoided Outcome: Progressing   Problem: Activity: Goal: Risk for activity intolerance will decrease Outcome: Progressing   Problem: Nutrition: Goal: Adequate nutrition will be maintained Outcome: Progressing   Problem: Coping: Goal: Level of anxiety will decrease Outcome: Progressing   Problem: Elimination: Goal: Will not experience complications related to bowel motility Outcome: Progressing Goal: Will not experience complications related to urinary retention Outcome: Progressing   Problem: Pain Managment: Goal: General experience of comfort will improve Outcome: Progressing   Problem: Safety: Goal: Ability to remain free from injury will improve Outcome: Progressing   Problem: Skin Integrity: Goal: Risk for impaired skin integrity will decrease Outcome: Progressing    Haydee Salter, RN

## 2021-08-27 NOTE — Progress Notes (Signed)
[facility] Infectious Diseases Follow Up Note  Patient Identification: Patient Name: Mathew Miller. MRN: [id number] Admit Date: 08/22/2021  1:51 PM Age: 75 y.o.Today's Date: 08/27/2021  Reason for Visit: MRSE bacteremia   Principal Problem:   Fever Active Problems:   Hyperlipidemia   Goals of care, counseling/discussion   CVA (cerebral vascular accident) ([place])   Dementia ([place])   Prolonged QT interval   Acute metabolic encephalopathy   Staphylococcus epidermidis bacteremia   Malnutrition of moderate degree   Altered mental status   Antibiotics: Vancomycin 11/5 - current                     Cefepime 11/5 -11/6   Lines/Hardwares: None   Interval Events: afebrile, No leukocytosis. Repeat blood cx 11/10 No growth in 2 day    Assessment MRSE bacteremia ( Both sets) with staph hominis ( only one set) -Staph hominis is most likely a contaminant. MRSE could be a contaminant as well but present in both sets and initial concerns for this attributing to his fever on presentation. Of note, he was recently admitted 11/1-11/3 for worsening mental status but unremarkable work up. hence, concerns for phlebitis. No known hardware. TTE unremarkable ( limited exam) -Repeat blood cx no growth in 2 days  -He is confused but otherwise stable   Fever- only one episode, Per wife, also had 105F temperature at home. UA with greater than 50 RBCs, 21-50 WBCs. Urine cx no growth.  He also had a recent CT renal study which was unremarkable except massive stool ball in the rectum. Cxray/CT head and Chest unremarkable. No reported diarrhea. Venous duplex of LE negative for DVT. Initial concern for bacteremia causing fevers but repeat blood cx are no growth in 2 days and less likely this to be a true bacteremia esp within 2 different organisms and no other infective concerns otherwise.   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 Monitor off abtx Management of delirium per primary  ID available as needed   Rest of the management as per the primary team. Thank you for the consult. Please page with pertinent questions or concerns.  ______________________________________________________________________ Subjective patient seen and examined at the bedside. Does not speak much, Per wife slept OK and eating well. No changes in mental status   Vitals BP (!) 150/76 (BP Location: Right Arm)   Pulse (!) 59   Temp 98.4 F (36.9 C) (Oral)   Resp 16   Ht 6\' 2"  (1.88 m)   Wt 70.7 kg Comment: bed scale  SpO2 99%   BMI 20.01 kg/m      Physical Exam Constitutional:  sitting up in bed, awake    Comments:   Cardiovascular:     Rate and Rhythm: Normal rate and regular rhythm.     Heart sounds:   Pulmonary:     Effort: Pulmonary effort is normal.     Comments:   Abdominal:     Palpations: Abdomen is soft.     Tenderness: Non tender and non distended   Musculoskeletal:        General: No swelling or tenderness.   Skin:    Comments: no obvious lesions or rashes   Neurological:     General: not interactive and remains silent   Psychiatric:        Mood and Affect: confused   Pertinent Microbiology Results for  orders placed or performed during the hospital encounter of 08/22/21  Resp Panel by RT-PCR (Flu A&B, Covid) Nasopharyngeal Swab     Status: None   Collection Time: 08/22/21  2:08 PM   Specimen: Nasopharyngeal Swab; Nasopharyngeal(NP) swabs in vial transport medium  Result Value Ref Range Status   SARS Coronavirus 2 by RT PCR NEGATIVE NEGATIVE Final    Comment: (NOTE) SARS-CoV-2 target nucleic acids are NOT DETECTED.  The SARS-CoV-2 RNA is generally detectable in upper respiratory specimens during the acute phase of infection. The lowest concentration of SARS-CoV-2 viral copies  this assay can detect is 138 copies/mL. A negative result does not preclude SARS-Cov-2 infection and should not be used as the sole basis for treatment or other patient management decisions. A negative result may occur with  improper specimen collection/handling, submission of specimen other than nasopharyngeal swab, presence of viral mutation(s) within the areas targeted by this assay, and inadequate number of viral copies(<138 copies/mL). A negative result must be combined with clinical observations, patient history, and epidemiological information. The expected result is Negative.  Fact Sheet for Patients:  EntrepreneurPulse.com.au  Fact Sheet for Healthcare Providers:  IncredibleEmployment.be  This test is no t yet approved or cleared by the [place] FDA and  has been authorized for detection and/or diagnosis of SARS-CoV-2 by FDA under an Emergency Use Authorization (EUA). This EUA will remain  in effect (meaning this test can be used) for the duration of the COVID-19 declaration under Section 564(b)(1) of the Act, 21 U.S.C.section 360bbb-3(b)(1), unless the authorization is terminated  or revoked sooner.       Influenza A by PCR NEGATIVE NEGATIVE Final   Influenza B by PCR NEGATIVE NEGATIVE Final    Comment: (NOTE) The Xpert Xpress SARS-CoV-2/FLU/RSV plus assay is intended as an aid in the diagnosis of influenza from Nasopharyngeal swab specimens and should not be used as a sole basis for treatment. Nasal washings and aspirates are unacceptable for Xpert Xpress SARS-CoV-2/FLU/RSV testing.  Fact Sheet for Patients: EntrepreneurPulse.com.au  Fact Sheet for Healthcare Providers: IncredibleEmployment.be  This test is not yet approved or cleared by the [place] FDA and has been authorized for detection and/or diagnosis of SARS-CoV-2 by FDA under an Emergency Use Authorization (EUA). This EUA  will remain in effect (meaning this test can be used) for the duration of the COVID-19 declaration under Section 564(b)(1) of the Act, 21 U.S.C. section 360bbb-3(b)(1), unless the authorization is terminated or revoked.  Performed at [facility], [place] [address], [place], [place] [address]   Urine Culture     Status: None   Collection Time: 08/22/21  2:08 PM   Specimen: In/Out Cath Urine  Result Value Ref Range Status   Specimen Description IN/OUT CATH URINE  Final   Special Requests NONE  Final   Culture   Final    NO GROWTH Performed at [facility], [place] [address] [place], [place] [address]    Report Status 08/23/2021 FINAL  Final  Blood Culture (routine x 2)     Status: Abnormal   Collection Time: 08/22/21  2:10 PM   Specimen: BLOOD LEFT ARM  Result Value Ref Range Status   Specimen Description BLOOD LEFT ARM  Final   Special Requests   Final    BOTTLES DRAWN AEROBIC AND ANAEROBIC Blood Culture results may not be optimal due to an excessive volume of blood received in culture bottles   Culture  Setup Time   Final  GRAM POSITIVE COCCI IN CLUSTERS IN BOTH AEROBIC AND ANAEROBIC BOTTLES CRITICAL RESULT CALLED TO, READ BACK BY AND VERIFIED WITH: E,BREWINGTON PHARMD @1115  08/23/21 EB    Culture (A)  Final    STAPHYLOCOCCUS EPIDERMIDIS STAPHYLOCOCCUS HOMINIS THE SIGNIFICANCE OF ISOLATING THIS ORGANISM FROM A SINGLE SET OF BLOOD CULTURES WHEN MULTIPLE SETS ARE DRAWN IS UNCERTAIN. PLEASE NOTIFY THE MICROBIOLOGY DEPARTMENT WITHIN ONE WEEK IF SPECIATION AND SENSITIVITIES ARE REQUIRED. Performed at [facility], [place] [address] [place], [place] [address]    Report Status 08/26/2021 FINAL  Final   Organism ID, Bacteria STAPHYLOCOCCUS EPIDERMIDIS  Final      Susceptibility   Staphylococcus epidermidis - MIC*    CIPROFLOXACIN >=8 RESISTANT Resistant     ERYTHROMYCIN >=8 RESISTANT Resistant     GENTAMICIN <=0.5 SENSITIVE Sensitive     OXACILLIN >=4 RESISTANT  Resistant     TETRACYCLINE >=16 RESISTANT Resistant     VANCOMYCIN 1 SENSITIVE Sensitive     TRIMETH/SULFA 160 RESISTANT Resistant     CLINDAMYCIN <=0.25 SENSITIVE Sensitive     RIFAMPIN <=0.5 SENSITIVE Sensitive     Inducible Clindamycin NEGATIVE Sensitive     * STAPHYLOCOCCUS EPIDERMIDIS  Blood Culture ID Panel (Reflexed)     Status: Abnormal   Collection Time: 08/22/21  2:10 PM  Result Value Ref Range Status   Enterococcus faecalis NOT DETECTED NOT DETECTED Final   Enterococcus Faecium NOT DETECTED NOT DETECTED Final   Listeria monocytogenes NOT DETECTED NOT DETECTED Final   Staphylococcus species DETECTED (A) NOT DETECTED Final    Comment: CRITICAL RESULT CALLED TO, READ BACK BY AND VERIFIED WITH: E,BREWINGTON PHARMD @1115  08/23/21 EB    Staphylococcus aureus (BCID) NOT DETECTED NOT DETECTED Final   Staphylococcus epidermidis DETECTED (A) NOT DETECTED Final    Comment: Methicillin (oxacillin) resistant coagulase negative staphylococcus. Possible blood culture contaminant (unless isolated from more than one blood culture draw or clinical case suggests pathogenicity). No antibiotic treatment is indicated for blood  culture contaminants. CRITICAL RESULT CALLED TO, READ BACK BY AND VERIFIED WITH: E,BREWINGTON PHARMD @1115  08/23/21 EB    Staphylococcus lugdunensis NOT DETECTED NOT DETECTED Final   Streptococcus species NOT DETECTED NOT DETECTED Final   Streptococcus agalactiae NOT DETECTED NOT DETECTED Final   Streptococcus pneumoniae NOT DETECTED NOT DETECTED Final   Streptococcus pyogenes NOT DETECTED NOT DETECTED Final   A.calcoaceticus-baumannii NOT DETECTED NOT DETECTED Final   Bacteroides fragilis NOT DETECTED NOT DETECTED Final   Enterobacterales NOT DETECTED NOT DETECTED Final   Enterobacter cloacae complex NOT DETECTED NOT DETECTED Final   Escherichia coli NOT DETECTED NOT DETECTED Final   Klebsiella aerogenes NOT DETECTED NOT DETECTED Final   Klebsiella oxytoca NOT DETECTED  NOT DETECTED Final   Klebsiella pneumoniae NOT DETECTED NOT DETECTED Final   Proteus species NOT DETECTED NOT DETECTED Final   Salmonella species NOT DETECTED NOT DETECTED Final   Serratia marcescens NOT DETECTED NOT DETECTED Final   Haemophilus influenzae NOT DETECTED NOT DETECTED Final   Neisseria meningitidis NOT DETECTED NOT DETECTED Final   Pseudomonas aeruginosa NOT DETECTED NOT DETECTED Final   Stenotrophomonas maltophilia NOT DETECTED NOT DETECTED Final   Candida albicans NOT DETECTED NOT DETECTED Final   Candida auris NOT DETECTED NOT DETECTED Final   Candida glabrata NOT DETECTED NOT DETECTED Final   Candida krusei NOT DETECTED NOT DETECTED Final   Candida parapsilosis NOT DETECTED NOT DETECTED Final   Candida tropicalis NOT DETECTED NOT DETECTED Final   Cryptococcus neoformans/gattii NOT DETECTED NOT  DETECTED Final   Methicillin resistance mecA/C DETECTED (A) NOT DETECTED Final    Comment: CRITICAL RESULT CALLED TO, READ BACK BY AND VERIFIED WITH: E,BREWINGTON PHARMD @1115  08/23/21 EB Performed at [facility], [address] [place], [place] [address]   Blood Culture (routine x 2)     Status: Abnormal (Preliminary result)   Collection Time: 08/22/21  2:20 PM   Specimen: BLOOD RIGHT ARM  Result Value Ref Range Status   Specimen Description BLOOD RIGHT ARM  Final   Special Requests   Final    BOTTLES DRAWN AEROBIC AND ANAEROBIC Blood Culture adequate volume   Culture  Setup Time   Final    GRAM POSITIVE COCCI IN CLUSTERS IN BOTH AEROBIC AND ANAEROBIC BOTTLES CRITICAL VALUE NOTED.  VALUE IS CONSISTENT WITH PREVIOUSLY REPORTED AND CALLED VALUE.    Culture (A)  Final    STAPHYLOCOCCUS EPIDERMIDIS CULTURE REINCUBATED FOR BETTER GROWTH Performed at [facility], [place] [address], [place], [place] [address]    Report Status PENDING  Incomplete  Culture, blood (routine x 2)     Status: None (Preliminary result)   Collection Time: 08/25/21  7:17 AM   Specimen:  BLOOD  Result Value Ref Range Status   Specimen Description BLOOD RIGHT ANTECUBITAL  Final   Special Requests   Final    BOTTLES DRAWN AEROBIC AND ANAEROBIC Blood Culture adequate volume   Culture   Final    NO GROWTH 1 DAY Performed at [facility], [place] [address], [place], [place] [address]    Report Status PENDING  Incomplete  Culture, blood (routine x 2)     Status: None (Preliminary result)   Collection Time: 08/25/21  7:25 AM   Specimen: BLOOD  Result Value Ref Range Status   Specimen Description BLOOD RIGHT ANTECUBITAL  Final   Special Requests   Final    BOTTLES DRAWN AEROBIC AND ANAEROBIC Blood Culture adequate volume   Culture   Final    NO GROWTH 1 DAY Performed at [facility], [place] [address] [place], [place] [address]    Report Status PENDING  Incomplete    Pertinent Lab. CBC Latest Ref Rng & Units 08/27/2021 08/26/2021 08/25/2021  WBC 4.0 - 10.5 K/uL 10.0 10.3 10.2  Hemoglobin 13.0 - 17.0 g/dL 12.6(L) 12.2(L) 12.4(L)  Hematocrit 39.0 - 52.0 % 37.2(L) 35.9(L) 35.6(L)  Platelets 150 - 400 K/uL 172 166 152   CMP Latest Ref Rng & Units 08/27/2021 08/26/2021 08/25/2021  Glucose 70 - 99 mg/dL 114(H) 102(H) 93  BUN 8 - 23 mg/dL 16 19 13   Creatinine 0.61 - 1.24 mg/dL 0.98 1.08 1.00  Sodium 135 - 145 mmol/L 138 138 139  Potassium 3.5 - 5.1 mmol/L 3.7 3.5 3.4(L)  Chloride 98 - 111 mmol/L 109 106 108  CO2 22 - 32 mmol/L 24 26 24   Calcium 8.9 - 10.3 mg/dL 8.5(L) 8.2(L) 8.4(L)  Total Protein 6.5 - 8.1 g/dL - - -  Total Bilirubin 0.3 - 1.2 mg/dL - - -  Alkaline Phos 38 - 126 U/L - - -  AST 15 - 41 U/L - - -  ALT 0 - 44 U/L - - -     Pertinent Imaging today Plain films and CT images have been personally visualized and interpreted; radiology reports have been reviewed. Decision making incorporated into the Impression / Recommendations.  TTE 08/23/21 IMPRESSIONS    1. Left ventricular ejection fraction, by estimation, is 60 to 65%.  The  left ventricle has  normal function. The left ventricle has no regional  wall motion abnormalities. Left ventricular diastolic parameters are  consistent with Grade I diastolic  dysfunction (impaired relaxation).   2. Right ventricular systolic function is normal. The right ventricular  size is normal. Tricuspid regurgitation signal is inadequate for assessing  PA pressure.   3. The mitral valve is normal in structure. Trivial mitral valve  regurgitation. No evidence of mitral stenosis.   4. The aortic valve has an indeterminant number of cusps. Aortic valve  regurgitation is not visualized. No aortic stenosis is present.   I spent more than 35 minutes for this patient encounter including review of prior medical records, coordination of care  with greater than 50% of time being face to face/counseling and discussing diagnostics/treatment plan with the patient/family.  Electronically signed by:   Rosiland Oz, MD Infectious Disease Physician [facility] Pager: [phone]

## 2021-08-27 NOTE — Progress Notes (Signed)
PROGRESS NOTE  Mathew Miller. OZH:[id number] DOB: Nov 28, 1945   PCP: Lucky Cowboy, MD  Patient is from: Home.  Lives with his wife.  Wheelchair and rolling walker at baseline.  Dependent for most ADLs  DOA: 08/22/2021 LOS: 4  Chief complaints:  Chief Complaint  Patient presents with   Code Sepsis     Brief Narrative / Interim history: 75 year old M with PMH of advanced dementia, CKD-3A, HTN, HLD, dysautonomia with postural hypotension, CVA, urinary retention with chronic Foley and recent hospitalization from 11/1-11/3 for SIRS returning with fever to 105 at home.  He had mild leukocytosis with fever to 101.9 in ED.  Cultures obtained.  Started on broad-spectrum antibiotics. Blood cultures with staph epidermis in 2 out of 2 bottles and staph hominis in 1 out of 2 bottles.  Antibiotics de-escalated to IV vancomycin.  ID consulted.  TTE did not show vegetation.  Repeat blood culture NGTD.  Had fever to 101.9 on admission but none since then.  No leukocytosis.  ID discontinued vancomycin on 11/10, and recommended observing off antibiotics.  Therapy recommends SNF.  Subjective: Seen and examined earlier this morning.  No major events overnight of this morning.  Patient's wife at bedside.  Reports good night.  Just woke up and eating breakfast with the help of his wife.  He is awake but quite.   Objective: Vitals:   08/26/21 1633 08/26/21 2130 08/27/21 0435 08/27/21 0903  BP: (!) 151/78 (!) 142/72 (!) 150/76 (!) 165/88  Pulse: (!) 58 (!) 57 (!) 59 64  Resp: 16  16 14   Temp: 98 F (36.7 C) 98.9 F (37.2 C) 98.4 F (36.9 C) (!) 97.5 F (36.4 C)  TempSrc: Oral Oral Oral Oral  SpO2: 100% 100% 99% 98%  Weight:      Height:        Intake/Output Summary (Last 24 hours) at 08/27/2021 1450 Last data filed at 08/27/2021 0437 Gross per 24 hour  Intake 1029.58 ml  Output 300 ml  Net 729.58 ml   Filed Weights   08/24/21 1134  Weight: 70.7 kg    Examination:  GENERAL: Frail  looking elderly male.  No apparent distress. HEENT: MMM.  Vision and hearing grossly intact.  NECK: Supple.  No apparent JVD.  RESP: On RA.  No IWOB.  Fair aeration bilaterally. CVS:  RRR. Heart sounds normal.  ABD/GI/GU: BS+. Abd soft, NTND.  Indwelling Foley in place. MSK/EXT:  Moves extremities. No apparent deformity.  Trace BLE edema. SKIN: Small areas of epidermal exfoliation on his back.  Looks like broken blister NEURO: Awake.  Only oriented to self.  Seems to follow some commands for his wife.  No apparent focal neuro deficit but limited exam. PSYCH: Calm.  No distress or agitation.  Procedures:  None  Microbiology summarized: 11/5-COVID-19 and influenza PCR nonreactive. 11/5- blood cultures with staph epidermis in 2 out of 2 bottles and staph hominis in 1 out of 2 bottles 11/8-blood cultures NGTD.  Assessment & Plan: Fever: reportedly febrile to 105 at home.  He was febrile to 101.9 in ED. Remains afebrile since then.  Mild leukocytosis but resolved.  CXR and CT chest negative.  UA with WBC and RBC.  Urine culture NGTD.  LE [place] negative for DVT.  TTE without vegetation.  ESR within normal.  Blood culture from 11/5 felt to be contaminant versus true bacteremia -ID discontinued IV vancomycin and recommends observation off antibiotics  Acute metabolic encephalopathy in patient with advanced dementia:  Repeat  CT head without acute finding.  Seems to be waxing and waning per patient's wife suggesting delirium. -Treat treatable causes. -Reorientation and delirium precautions. -Palliative medicine consulted and following. -Plan for palliative follow-up at SNF   CKD-3A: Renal function better than his baseline. Recent Labs    01/20/21 1131 06/23/21 1701 08/18/21 1107 08/19/21 0300 08/22/21 1358 08/23/21 0721 08/24/21 0844 08/25/21 0718 08/26/21 0155 08/27/21 0142  BUN 26* 23 31* 31* 24* 23 20 13 19 16   CREATININE 1.41* 1.47* 1.67* 1.47* 1.45* 1.28* 1.00 1.00 1.08 0.98   -Continue monitoring   Urinary retention: Unclear etiology.  Recent CT renal stone study showed multiple vascular clips at prostate and massive stool ball in rectum.  -Continue with foley catheter. Wife concerned about discontinuing Foley catheter. Understand infection risk -Manage constipation as below   History of CVA: -Resume aspirin for secondary prophylaxis.   Prolonged QT: QTc 538 but exaggerated by wide QRS/LBBB -Minimize and avoid QT prolonging drugs. -Optimize electrolytes   Hypokalemia: K 3.7.   Lower back pain/stable L2 and L3 compression fracture deformities -Continue Tylenol as needed   Constipation: Stool ball noted on CT of 11/2. Per wife patient has had BM since discharge.  Seems to have resolved now. -Continue senna and MiraLAX daily  Physical deconditioning-patient is very deconditioned and dependent for most ADLs and transfer -PT/OT recommended SNF  Health maintenance -Received influenza vaccine on 11/9.   Moderate malnutrition Body mass index is 20.01 kg/m. Nutrition Problem: Moderate Malnutrition Etiology: chronic illness (Dementia) Signs/Symptoms: mild fat depletion, mild muscle depletion Interventions: Ensure Enlive (each supplement provides 350kcal and 20 grams of protein), MVI   DVT prophylaxis:  enoxaparin (LOVENOX) injection 40 mg Start: 08/22/21 1730  Code Status: DNR/DNI Family Communication: Updated patient's wife at bedside. Level of care: Telemetry Medical Status is: Inpatient  Remains inpatient appropriate because: Safe disposition/SNF.    Consultants:  Infectious disease-signed off Palliative medicine   Sch Meds:  Scheduled Meds:  aspirin EC  81 mg Oral Daily   bethanechol  10 mg Oral TID   Chlorhexidine Gluconate Cloth  6 each Topical Daily   enoxaparin (LOVENOX) injection  40 mg Subcutaneous Q24H   feeding supplement  237 mL Oral BID BM   multivitamin  1 tablet Oral QHS   polyethylene glycol  17 g Oral Daily    pyridostigmine  60 mg Oral BID   rosuvastatin  5 mg Oral Daily   senna  1 tablet Oral Daily   sodium chloride flush  3 mL Intravenous Q12H   Suvorexant  15 mg Oral QHS   Continuous Infusions:  lactated ringers 50 mL/hr at 08/25/21 1240   PRN Meds:.acetaminophen, bisacodyl, hydrALAZINE, melatonin  Antimicrobials: Anti-infectives (From admission, onward)    Start     Dose/Rate Route Frequency Ordered Stop   08/23/21 1600  vancomycin (VANCOREADY) IVPB 1250 mg/250 mL  Status:  Discontinued        1,250 mg 166.7 mL/hr over 90 Minutes Intravenous Every 24 hours 08/22/21 1524 08/27/21 1305   08/22/21 1430  vancomycin (VANCOREADY) IVPB 1500 mg/300 mL        1,500 mg 150 mL/hr over 120 Minutes Intravenous  Once 08/22/21 1425 08/22/21 1749   08/22/21 1430  ceFEPIme (MAXIPIME) 2 g in sodium chloride 0.9 % 100 mL IVPB  Status:  Discontinued        2 g 200 mL/hr over 30 Minutes Intravenous Every 12 hours 08/22/21 1425 08/23/21 1354   08/22/21 1400  cefTRIAXone (ROCEPHIN) 1 g  in sodium chloride 0.9 % 100 mL IVPB  Status:  Discontinued        1 g 200 mL/hr over 30 Minutes Intravenous  Once 08/22/21 1359 08/22/21 1359        I have personally reviewed the following labs and images: CBC: Recent Labs  Lab 08/22/21 1358 08/22/21 1425 08/23/21 0721 08/24/21 0844 08/25/21 0718 08/26/21 0155 08/27/21 0142  WBC 9.5  --  11.0* 11.4* 10.2 10.3 10.0  NEUTROABS 8.8*  --   --   --   --   --   --   HGB 12.5*   < > 12.5* 13.2 12.4* 12.2* 12.6*  HCT 36.9*   < > 37.1* 38.2* 35.6* 35.9* 37.2*  MCV 90.2  --  90.7 87.8 89.2 89.5 89.9  PLT 150  --  144* 157 152 166 172   < > = values in this interval not displayed.   BMP &GFR Recent Labs  Lab 08/23/21 0721 08/24/21 0844 08/25/21 0718 08/26/21 0155 08/27/21 0142  NA 141 141 139 138 138  K 3.6 3.3* 3.4* 3.5 3.7  CL 113* 111 108 106 109  CO2 22 23 24 26 24   GLUCOSE 91 99 93 102* 114*  BUN 23 20 13 19 16   CREATININE 1.28* 1.00 1.00 1.08 0.98   CALCIUM 8.3* 8.6* 8.4* 8.2* 8.5*  MG  --   --   --  1.8 1.9  PHOS  --   --   --   --  2.6   Estimated Creatinine Clearance: 63.1 mL/min (by C-G formula based on SCr of 0.98 mg/dL). Liver & Pancreas: Recent Labs  Lab 08/22/21 1358 08/27/21 0142  AST 13*  --   ALT 11  --   ALKPHOS 59  --   BILITOT 0.9  --   PROT 4.4*  --   ALBUMIN 2.4* 2.4*   No results for input(s): LIPASE, AMYLASE in the last 168 hours. Recent Labs  Lab 08/27/21 0142  AMMONIA 21   Diabetic: No results for input(s): HGBA1C in the last 72 hours. No results for input(s): GLUCAP in the last 168 hours. Cardiac Enzymes: Recent Labs  Lab 08/27/21 0142  CKTOTAL 32*   No results for input(s): PROBNP in the last 8760 hours. Coagulation Profile: Recent Labs  Lab 08/22/21 1358  INR 1.4*   Thyroid Function Tests: Recent Labs    08/27/21 0142  TSH 2.697   Lipid Profile: No results for input(s): CHOL, HDL, LDLCALC, TRIG, CHOLHDL, LDLDIRECT in the last 72 hours. Anemia Panel: No results for input(s): VITAMINB12, FOLATE, FERRITIN, TIBC, IRON, RETICCTPCT in the last 72 hours. Urine analysis:    Component Value Date/Time   COLORURINE AMBER (A) 08/22/2021 1358   APPEARANCEUR CLOUDY (A) 08/22/2021 1358   LABSPEC 1.025 08/22/2021 1358   PHURINE 5.0 08/22/2021 1358   GLUCOSEU NEGATIVE 08/22/2021 1358   HGBUR LARGE (A) 08/22/2021 1358   BILIRUBINUR NEGATIVE 08/22/2021 1358   KETONESUR 20 (A) 08/22/2021 1358   PROTEINUR 30 (A) 08/22/2021 1358   UROBILINOGEN 1 06/12/2014 1156   NITRITE NEGATIVE 08/22/2021 1358   LEUKOCYTESUR TRACE (A) 08/22/2021 1358   Sepsis Labs: Invalid input(s): PROCALCITONIN, LACTICIDVEN  Microbiology: Recent Results (from the past 240 hour(s))  Urine Culture     Status: Abnormal   Collection Time: 08/18/21 11:54 PM   Specimen: In/Out Cath Urine  Result Value Ref Range Status   Specimen Description IN/OUT CATH URINE  Final   Special Requests   Final  Normal Performed at  [facility], [address] [place], [place] [address]    Culture MULTIPLE SPECIES PRESENT, SUGGEST RECOLLECTION (A)  Final   Report Status 08/20/2021 FINAL  Final  Blood culture (routine single)     Status: None   Collection Time: 08/19/21 12:20 AM   Specimen: BLOOD  Result Value Ref Range Status   Specimen Description BLOOD RIGHT ANTECUBITAL  Final   Special Requests   Final    BOTTLES DRAWN AEROBIC AND ANAEROBIC Blood Culture results may not be optimal due to an inadequate volume of blood received in culture bottles   Culture   Final    NO GROWTH 5 DAYS Performed at [facility], [address] [place], [place] [address]    Report Status 08/24/2021 FINAL  Final  Resp Panel by RT-PCR (Flu A&B, Covid) Nasopharyngeal Swab     Status: None   Collection Time: 08/19/21  5:03 AM   Specimen: Nasopharyngeal Swab; Nasopharyngeal(NP) swabs in vial transport medium  Result Value Ref Range Status   SARS Coronavirus 2 by RT PCR NEGATIVE NEGATIVE Final    Comment: (NOTE) SARS-CoV-2 target nucleic acids are NOT DETECTED.  The SARS-CoV-2 RNA is generally detectable in upper respiratory specimens during the acute phase of infection. The lowest concentration of SARS-CoV-2 viral copies this assay can detect is 138 copies/mL. A negative result does not preclude SARS-Cov-2 infection and should not be used as the sole basis for treatment or other patient management decisions. A negative result may occur with  improper specimen collection/handling, submission of specimen other than nasopharyngeal swab, presence of viral mutation(s) within the areas targeted by this assay, and inadequate number of viral copies(<138 copies/mL). A negative result must be combined with clinical observations, patient history, and epidemiological information. The expected result is Negative.  Fact Sheet for Patients:  BloggerCourse.com  Fact Sheet for Healthcare Providers:   SeriousBroker.it  This test is no t yet approved or cleared by the [place] FDA and  has been authorized for detection and/or diagnosis of SARS-CoV-2 by FDA under an Emergency Use Authorization (EUA). This EUA will remain  in effect (meaning this test can be used) for the duration of the COVID-19 declaration under Section 564(b)(1) of the Act, 21 U.S.C.section 360bbb-3(b)(1), unless the authorization is terminated  or revoked sooner.       Influenza A by PCR NEGATIVE NEGATIVE Final   Influenza B by PCR NEGATIVE NEGATIVE Final    Comment: (NOTE) The Xpert Xpress SARS-CoV-2/FLU/RSV plus assay is intended as an aid in the diagnosis of influenza from Nasopharyngeal swab specimens and should not be used as a sole basis for treatment. Nasal washings and aspirates are unacceptable for Xpert Xpress SARS-CoV-2/FLU/RSV testing.  Fact Sheet for Patients: BloggerCourse.com  Fact Sheet for Healthcare Providers: SeriousBroker.it  This test is not yet approved or cleared by the [place] FDA and has been authorized for detection and/or diagnosis of SARS-CoV-2 by FDA under an Emergency Use Authorization (EUA). This EUA will remain in effect (meaning this test can be used) for the duration of the COVID-19 declaration under Section 564(b)(1) of the Act, 21 U.S.C. section 360bbb-3(b)(1), unless the authorization is terminated or revoked.  Performed at [facility], [address], [place], [place] [address]   Resp Panel by RT-PCR (Flu A&B, Covid) Nasopharyngeal Swab     Status: None   Collection Time: 08/22/21  2:08 PM   Specimen: Nasopharyngeal Swab; Nasopharyngeal(NP) swabs in vial transport  medium  Result Value Ref Range Status   SARS Coronavirus 2 by RT PCR NEGATIVE NEGATIVE Final    Comment: (NOTE) SARS-CoV-2 target nucleic acids are NOT DETECTED.  The SARS-CoV-2 RNA is generally detectable in  upper respiratory specimens during the acute phase of infection. The lowest concentration of SARS-CoV-2 viral copies this assay can detect is 138 copies/mL. A negative result does not preclude SARS-Cov-2 infection and should not be used as the sole basis for treatment or other patient management decisions. A negative result may occur with  improper specimen collection/handling, submission of specimen other than nasopharyngeal swab, presence of viral mutation(s) within the areas targeted by this assay, and inadequate number of viral copies(<138 copies/mL). A negative result must be combined with clinical observations, patient history, and epidemiological information. The expected result is Negative.  Fact Sheet for Patients:  BloggerCourse.com  Fact Sheet for Healthcare Providers:  SeriousBroker.it  This test is no t yet approved or cleared by the [place] FDA and  has been authorized for detection and/or diagnosis of SARS-CoV-2 by FDA under an Emergency Use Authorization (EUA). This EUA will remain  in effect (meaning this test can be used) for the duration of the COVID-19 declaration under Section 564(b)(1) of the Act, 21 U.S.C.section 360bbb-3(b)(1), unless the authorization is terminated  or revoked sooner.       Influenza A by PCR NEGATIVE NEGATIVE Final   Influenza B by PCR NEGATIVE NEGATIVE Final    Comment: (NOTE) The Xpert Xpress SARS-CoV-2/FLU/RSV plus assay is intended as an aid in the diagnosis of influenza from Nasopharyngeal swab specimens and should not be used as a sole basis for treatment. Nasal washings and aspirates are unacceptable for Xpert Xpress SARS-CoV-2/FLU/RSV testing.  Fact Sheet for Patients: BloggerCourse.com  Fact Sheet for Healthcare Providers: SeriousBroker.it  This test is not yet approved or cleared by the [place] FDA and has been  authorized for detection and/or diagnosis of SARS-CoV-2 by FDA under an Emergency Use Authorization (EUA). This EUA will remain in effect (meaning this test can be used) for the duration of the COVID-19 declaration under Section 564(b)(1) of the Act, 21 U.S.C. section 360bbb-3(b)(1), unless the authorization is terminated or revoked.  Performed at [facility], [address], [place], [place] [address]   Urine Culture     Status: None   Collection Time: 08/22/21  2:08 PM   Specimen: In/Out Cath Urine  Result Value Ref Range Status   Specimen Description IN/OUT CATH URINE  Final   Special Requests NONE  Final   Culture   Final    NO GROWTH Performed at [facility], [address], [place], [place] [address]    Report Status 08/23/2021 FINAL  Final  Blood Culture (routine x 2)     Status: Abnormal   Collection Time: 08/22/21  2:10 PM   Specimen: BLOOD LEFT ARM  Result Value Ref Range Status   Specimen Description BLOOD LEFT ARM  Final   Special Requests   Final    BOTTLES DRAWN AEROBIC AND ANAEROBIC Blood Culture results may not be optimal due to an excessive volume of blood received in culture bottles   Culture  Setup Time   Final    GRAM POSITIVE COCCI IN CLUSTERS IN BOTH AEROBIC AND ANAEROBIC BOTTLES CRITICAL RESULT CALLED TO, READ BACK BY AND VERIFIED WITH: E,BREWINGTON PHARMD @1115  08/23/21 EB    Culture (A)  Final    STAPHYLOCOCCUS EPIDERMIDIS STAPHYLOCOCCUS HOMINIS THE SIGNIFICANCE OF  ISOLATING THIS ORGANISM FROM A SINGLE SET OF BLOOD CULTURES WHEN MULTIPLE SETS ARE DRAWN IS UNCERTAIN. PLEASE NOTIFY THE MICROBIOLOGY DEPARTMENT WITHIN ONE WEEK IF SPECIATION AND SENSITIVITIES ARE REQUIRED. Performed at [facility], [address], [place], [place] [address]    Report Status 08/26/2021 FINAL  Final   Organism ID, Bacteria STAPHYLOCOCCUS EPIDERMIDIS  Final      Susceptibility   Staphylococcus epidermidis - MIC*    CIPROFLOXACIN >=8 RESISTANT Resistant      ERYTHROMYCIN >=8 RESISTANT Resistant     GENTAMICIN <=0.5 SENSITIVE Sensitive     OXACILLIN >=4 RESISTANT Resistant     TETRACYCLINE >=16 RESISTANT Resistant     VANCOMYCIN 1 SENSITIVE Sensitive     TRIMETH/SULFA 160 RESISTANT Resistant     CLINDAMYCIN <=0.25 SENSITIVE Sensitive     RIFAMPIN <=0.5 SENSITIVE Sensitive     Inducible Clindamycin NEGATIVE Sensitive     * STAPHYLOCOCCUS EPIDERMIDIS  Blood Culture ID Panel (Reflexed)     Status: Abnormal   Collection Time: 08/22/21  2:10 PM  Result Value Ref Range Status   Enterococcus faecalis NOT DETECTED NOT DETECTED Final   Enterococcus Faecium NOT DETECTED NOT DETECTED Final   Listeria monocytogenes NOT DETECTED NOT DETECTED Final   Staphylococcus species DETECTED (A) NOT DETECTED Final    Comment: CRITICAL RESULT CALLED TO, READ BACK BY AND VERIFIED WITH: E,BREWINGTON PHARMD @1115  08/23/21 EB    Staphylococcus aureus (BCID) NOT DETECTED NOT DETECTED Final   Staphylococcus epidermidis DETECTED (A) NOT DETECTED Final    Comment: Methicillin (oxacillin) resistant coagulase negative staphylococcus. Possible blood culture contaminant (unless isolated from more than one blood culture draw or clinical case suggests pathogenicity). No antibiotic treatment is indicated for blood  culture contaminants. CRITICAL RESULT CALLED TO, READ BACK BY AND VERIFIED WITH: E,BREWINGTON PHARMD @1115  08/23/21 EB    Staphylococcus lugdunensis NOT DETECTED NOT DETECTED Final   Streptococcus species NOT DETECTED NOT DETECTED Final   Streptococcus agalactiae NOT DETECTED NOT DETECTED Final   Streptococcus pneumoniae NOT DETECTED NOT DETECTED Final   Streptococcus pyogenes NOT DETECTED NOT DETECTED Final   A.calcoaceticus-baumannii NOT DETECTED NOT DETECTED Final   Bacteroides fragilis NOT DETECTED NOT DETECTED Final   Enterobacterales NOT DETECTED NOT DETECTED Final   Enterobacter cloacae complex NOT DETECTED NOT DETECTED Final   Escherichia coli NOT  DETECTED NOT DETECTED Final   Klebsiella aerogenes NOT DETECTED NOT DETECTED Final   Klebsiella oxytoca NOT DETECTED NOT DETECTED Final   Klebsiella pneumoniae NOT DETECTED NOT DETECTED Final   Proteus species NOT DETECTED NOT DETECTED Final   Salmonella species NOT DETECTED NOT DETECTED Final   Serratia marcescens NOT DETECTED NOT DETECTED Final   Haemophilus influenzae NOT DETECTED NOT DETECTED Final   Neisseria meningitidis NOT DETECTED NOT DETECTED Final   Pseudomonas aeruginosa NOT DETECTED NOT DETECTED Final   Stenotrophomonas maltophilia NOT DETECTED NOT DETECTED Final   Candida albicans NOT DETECTED NOT DETECTED Final   Candida auris NOT DETECTED NOT DETECTED Final   Candida glabrata NOT DETECTED NOT DETECTED Final   Candida krusei NOT DETECTED NOT DETECTED Final   Candida parapsilosis NOT DETECTED NOT DETECTED Final   Candida tropicalis NOT DETECTED NOT DETECTED Final   Cryptococcus neoformans/gattii NOT DETECTED NOT DETECTED Final   Methicillin resistance mecA/C DETECTED (A) NOT DETECTED Final    Comment: CRITICAL RESULT CALLED TO, READ BACK BY AND VERIFIED WITH: E,BREWINGTON PHARMD @1115  08/23/21 EB Performed at [facility], [address], [place], [place]  [address]   Blood Culture (routine x 2)     Status: Abnormal (Preliminary result)   Collection Time: 08/22/21  2:20 PM   Specimen: BLOOD RIGHT ARM  Result Value Ref Range Status   Specimen Description BLOOD RIGHT ARM  Final   Special Requests   Final    BOTTLES DRAWN AEROBIC AND ANAEROBIC Blood Culture adequate volume   Culture  Setup Time   Final    GRAM POSITIVE COCCI IN CLUSTERS IN BOTH AEROBIC AND ANAEROBIC BOTTLES CRITICAL VALUE NOTED.  VALUE IS CONSISTENT WITH PREVIOUSLY REPORTED AND CALLED VALUE.    Culture (A)  Final    STAPHYLOCOCCUS EPIDERMIDIS SUSCEPTIBILITIES TO FOLLOW Performed at [facility], [address], [place], [place] [address]    Report Status PENDING  Incomplete  Culture,  blood (routine x 2)     Status: None (Preliminary result)   Collection Time: 08/25/21  7:17 AM   Specimen: BLOOD  Result Value Ref Range Status   Specimen Description BLOOD RIGHT ANTECUBITAL  Final   Special Requests   Final    BOTTLES DRAWN AEROBIC AND ANAEROBIC Blood Culture adequate volume   Culture   Final    NO GROWTH 2 DAYS Performed at [facility], [address], [place], [place] [address]    Report Status PENDING  Incomplete  Culture, blood (routine x 2)     Status: None (Preliminary result)   Collection Time: 08/25/21  7:25 AM   Specimen: BLOOD  Result Value Ref Range Status   Specimen Description BLOOD RIGHT ANTECUBITAL  Final   Special Requests   Final    BOTTLES DRAWN AEROBIC AND ANAEROBIC Blood Culture adequate volume   Culture   Final    NO GROWTH 2 DAYS Performed at [facility], [address] [place], [place] [address]    Report Status PENDING  Incomplete    Radiology Studies: No results found.    Kaylan Friedmann T. Ruchel Brandenburger [facility]  If 7PM-7AM, please contact night-coverage [email or web address] 08/27/2021, 2:50 PM

## 2021-08-27 NOTE — Progress Notes (Signed)
Nutrition Follow-up  DOCUMENTATION CODES:   Non-severe (moderate) malnutrition in context of chronic illness  INTERVENTION:   Continue Renal Multivitamin w/ minerals daily  Continue Ensure Enlive po BID, each supplement provides 350 kcal and 20 grams of protein  Encourage good PO intake  NUTRITION DIAGNOSIS:   Moderate Malnutrition related to chronic illness (Dementia) as evidenced by mild fat depletion, mild muscle depletion.  GOAL:   Patient will meet greater than or equal to 90% of their needs  MONITOR:   PO intake, Supplement acceptance, I & O's, Labs  REASON FOR ASSESSMENT:   Consult Assessment of nutrition requirement/status  ASSESSMENT:   75 y.o. male presented to the ED with a fever; pt recently admitted 11/1-11/3 for the same reason. Upon admission no source of infection was found, foley was placed and pt was d/c. PMH includes HTN, advanced dementia, CKD IIIa, and a CVA. Pt admitted for unknown cause of fever.   PLDN received another consult for poor PO intake.   Pt resting in bed, more wake than previous visit. Not very talkative and does not answer questions.   Observed breakfast tray in pt room, >50% consumed.  PLDN observed Ensure in room, opened by tray 0% consumed; bedside table away from bed, pt unable to reach if wanted.  Per EMR, pt intake includes 20% for breakfast and 25% for lunch on 11/8.   PLDN observed multiple unopened Ensure's in pt room. RN noted the same when discussing pt.   Results found pt to have MRSE bacteremia. Plan to discharge to SNF.  Medications reviewed and include: Rena-Vit, Miralax, Senokot, IV antibiotics Labs reviewed.  Diet Order:   Diet Order             Diet regular Room service appropriate? Yes; Fluid consistency: Thin  Diet effective now                   EDUCATION NEEDS:   No education needs have been identified at this time  Skin:  Skin Assessment: Reviewed RN Assessment  Last BM:   08/26/2021  Height:   Ht Readings from Last 1 Encounters:  08/24/21 6\' 2"  (1.88 m)    Weight:   Wt Readings from Last 1 Encounters:  08/24/21 70.7 kg    Ideal Body Weight:  86.4 kg  BMI:  Body mass index is 20.01 kg/m.  Estimated Nutritional Needs:   Kcal:  2150-2350  Protein:  110-125 grams  Fluid:  > 2 L    Shakaya Bhullar BS, PLDN Clinical Dietitian See [facility] for contact information.

## 2021-08-28 DIAGNOSIS — Z0000 Encounter for general adult medical examination without abnormal findings: Secondary | ICD-10-CM | POA: Diagnosis not present

## 2021-08-28 DIAGNOSIS — H1012 Acute atopic conjunctivitis, left eye: Secondary | ICD-10-CM

## 2021-08-28 DIAGNOSIS — R7881 Bacteremia: Secondary | ICD-10-CM | POA: Diagnosis not present

## 2021-08-28 DIAGNOSIS — R509 Fever, unspecified: Secondary | ICD-10-CM | POA: Diagnosis not present

## 2021-08-28 DIAGNOSIS — G9341 Metabolic encephalopathy: Secondary | ICD-10-CM | POA: Diagnosis not present

## 2021-08-28 DIAGNOSIS — J309 Allergic rhinitis, unspecified: Secondary | ICD-10-CM

## 2021-08-28 LAB — RENAL FUNCTION PANEL
Albumin: 2.2 g/dL — ABNORMAL LOW (ref 3.5–5.0)
Anion gap: 5 (ref 5–15)
BUN: 14 mg/dL (ref 8–23)
CO2: 26 mmol/L (ref 22–32)
Calcium: 8.2 mg/dL — ABNORMAL LOW (ref 8.9–10.3)
Chloride: 107 mmol/L (ref 98–111)
Creatinine, Ser: 0.87 mg/dL (ref 0.61–1.24)
GFR, Estimated: >60 mL/min (ref >60)
Glucose, Bld: 101 mg/dL — ABNORMAL HIGH (ref 70–99)
Phosphorus: 2.6 mg/dL (ref 2.5–4.6)
Potassium: 3.4 mmol/L — ABNORMAL LOW (ref 3.5–5.1)
Sodium: 138 mmol/L (ref 135–145)

## 2021-08-28 LAB — CULTURE, BLOOD (ROUTINE X 2): Special Requests: ADEQUATE

## 2021-08-28 LAB — MAGNESIUM: Magnesium: 1.8 mg/dL (ref 1.7–2.4)

## 2021-08-28 LAB — CBC
HCT: 35.4 % — ABNORMAL LOW (ref 39.0–52.0)
Hemoglobin: 12.1 g/dL — ABNORMAL LOW (ref 13.0–17.0)
MCH: 30.6 pg (ref 26.0–34.0)
MCHC: 34.2 g/dL (ref 30.0–36.0)
MCV: 89.4 fL (ref 80.0–100.0)
Platelets: 173 10*3/uL (ref 150–400)
RBC: 3.96 MIL/uL — ABNORMAL LOW (ref 4.22–5.81)
RDW: 13.6 % (ref 11.5–15.5)
WBC: 11.7 10*3/uL — ABNORMAL HIGH (ref 4.0–10.5)
nRBC: 0 % (ref 0.0–0.2)

## 2021-08-28 MED ORDER — POTASSIUM CHLORIDE CRYS ER 20 MEQ PO TBCR
40.0000 meq | EXTENDED_RELEASE_TABLET | ORAL | Status: AC
  Administered 2021-08-28 (×2): 40 meq via ORAL
  Filled 2021-08-28 (×2): qty 2

## 2021-08-28 MED ORDER — LORATADINE 10 MG PO TABS
10.0000 mg | ORAL_TABLET | Freq: Every day | ORAL | Status: DC
  Administered 2021-08-28 – 2021-09-01 (×5): 10 mg via ORAL
  Filled 2021-08-28 (×5): qty 1

## 2021-08-28 MED ORDER — TAMSULOSIN HCL 0.4 MG PO CAPS
0.4000 mg | ORAL_CAPSULE | Freq: Every day | ORAL | Status: DC
  Administered 2021-08-28 – 2021-08-31 (×4): 0.4 mg via ORAL
  Filled 2021-08-28 (×5): qty 1

## 2021-08-28 MED ORDER — NAPHAZOLINE-GLYCERIN 0.012-0.25 % OP SOLN
1.0000 [drp] | Freq: Four times a day (QID) | OPHTHALMIC | Status: DC | PRN
  Filled 2021-08-28: qty 15

## 2021-08-28 NOTE — Progress Notes (Signed)
PROGRESS NOTE  Mathew Miller. WUJ:[id number] DOB: 01/08/1946   PCP: Lucky Cowboy, MD  Patient is from: Home.  Lives with his wife.  Wheelchair and rolling walker at baseline.  Dependent for most ADLs  DOA: 08/22/2021 LOS: 5  Chief complaints:  Chief Complaint  Patient presents with   Code Sepsis     Brief Narrative / Interim history: 75 year old M with PMH of advanced dementia, CKD-3A, HTN, HLD, dysautonomia with postural hypotension, CVA, urinary retention with chronic Foley and recent hospitalization from 11/1-11/3 for SIRS returning with fever to 105 at home.  He had mild leukocytosis with fever to 101.9 in ED.  Cultures obtained.  Started on broad-spectrum antibiotics. Blood cultures with staph epidermis in 2 out of 2 bottles and staph hominis in 1 out of 2 bottles.  Antibiotics de-escalated to IV vancomycin.  ID consulted.  TTE did not show vegetation.  Repeat blood culture NGTD.  Had fever to 101.9 on admission but none since then.  No leukocytosis.  ID discontinued vancomycin on 11/10, and recommended observing off antibiotics.  Therapy recommends SNF.  Subjective: Seen and examined earlier this morning.  No major events overnight of this morning.  No complaints but not a great historian.  He responds no to pain or trouble breathing.  Patient's wife at bedside.  No major concerns other than some redness in his left eye.  She says he uses eyedrop for dry eye at home.  Objective: Vitals:   08/27/21 0903 08/27/21 1643 08/27/21 1955 08/28/21 0732  BP: (!) 165/88 (!) 149/72 (!) 147/75 (!) 145/81  Pulse: 64 (!) 57 71 (!) 59  Resp: 14 14 18 16   Temp: (!) 97.5 F (36.4 C) 98.7 F (37.1 C) 98.3 F (36.8 C) (!) 97.5 F (36.4 C)  TempSrc: Oral Oral Oral   SpO2: 98% 100% 97% 98%  Weight:      Height:        Intake/Output Summary (Last 24 hours) at 08/28/2021 1300 Last data filed at 08/28/2021 0930 Gross per 24 hour  Intake 573.47 ml  Output 1200 ml  Net -626.53 ml    Filed Weights   08/24/21 1134  Weight: 70.7 kg    Examination:  GENERAL: No apparent distress.  Nontoxic. HEENT: MMM.  Vision and hearing grossly intact.  Mild conjunctival injection in left eye.  Runny nose NECK: Supple.  No apparent JVD.  RESP: 98% on RA.  No IWOB.  Fair aeration bilaterally. CVS:  RRR. Heart sounds normal.  ABD/GI/GU: BS+. Abd soft, NTND.  Indwelling Foley catheter. MSK/EXT:  Moves extremities. No apparent deformity.  Trace BLE edema. SKIN: no apparent skin lesion or wound NEURO: Awake.  Oriented to self.  No apparent focal neuro deficit but limited exam. PSYCH: Calm. Normal affect.   Procedures:  None  Microbiology summarized: 11/5-COVID-19 and influenza PCR nonreactive. 11/5- blood cultures with staph epidermis in 2 out of 2 bottles and staph hominis in 1 out of 2 bottles 11/8-blood cultures NGTD.  Assessment & Plan: Fever: reportedly febrile to 105 at home.  He was febrile to 101.9 in ED. Remains afebrile since then.  Mild leukocytosis but resolved.  CXR and CT chest negative.  UA with WBC and RBC.  Urine culture NGTD.  LE [place] negative for DVT.  TTE without vegetation.  ESR within normal.  Blood culture from 11/5 felt to be contaminant versus true bacteremia -ID discontinued IV vancomycin and recommends observation off antibiotics -Continue monitoring for leukocytosis and fever.  Acute  metabolic encephalopathy in patient with advanced dementia:  Repeat CT head without acute finding.  Seems to be waxing and waning per patient's wife suggesting delirium. -Treat treatable causes. -Reorientation and delirium precautions. -Palliative medicine consulted and following. -Plan for palliative follow-up at SNF   CKD-3A: Renal function better than his baseline. Recent Labs    06/23/21 1701 08/18/21 1107 08/19/21 0300 08/22/21 1358 08/23/21 0721 08/24/21 0844 08/25/21 0718 08/26/21 0155 08/27/21 0142 08/28/21 0117  BUN 23 31* 31* 24* 23 20 13 19 16 14    CREATININE 1.47* 1.67* 1.47* 1.45* 1.28* 1.00 1.00 1.08 0.98 0.87  -Continue monitoring   Urinary retention: Unclear etiology.  Recent CT renal stone study showed multiple vascular clips at prostate and massive stool ball in rectum.  -Continue with foley catheter. Wife concerned about discontinuing Foley catheter. Understand infection risk -Manage constipation as below   History of CVA: No apparent focal neurodeficit but limited exam. -Resume aspirin for secondary prophylaxis.   Prolonged QT: QTc 538 but exaggerated by wide QRS/LBBB -Minimize and avoid QT prolonging drugs. -Optimize electrolytes   Hypokalemia: K 3.4.  Mg within normal. -P.o. KCl 40 mill equivalent x2   Lower back pain/stable L2 and L3 compression fracture deformities -Continue Tylenol as needed   Constipation: Stool ball noted on CT of 11/2. Per wife patient has had BM since discharge.  Seems to have resolved now. -Continue senna and MiraLAX daily  Physical deconditioning-patient is very deconditioned and dependent for most ADLs and transfer -PT/OT recommended SNF  Health maintenance -Received influenza vaccine on 11/9.  Allergic rhinitis with conjunctivitis-some conjunctival injection and left with runny nose and tears. -Ordered eyedrop and resume home Claritin.  Moderate malnutrition Body mass index is 20.01 kg/m. Nutrition Problem: Moderate Malnutrition Etiology: chronic illness (Dementia) Signs/Symptoms: mild fat depletion, mild muscle depletion Interventions: Ensure Enlive (each supplement provides 350kcal and 20 grams of protein), MVI   DVT prophylaxis:  enoxaparin (LOVENOX) injection 40 mg Start: 08/22/21 1730  Code Status: DNR/DNI Family Communication: Updated patient's wife at bedside. Level of care: Telemetry Medical Status is: Inpatient  Remains inpatient appropriate because: Safe disposition/SNF.    Consultants:  Infectious disease-signed off Palliative medicine   Sch Meds:   Scheduled Meds:  aspirin EC  81 mg Oral Daily   Chlorhexidine Gluconate Cloth  6 each Topical Daily   enoxaparin (LOVENOX) injection  40 mg Subcutaneous Q24H   feeding supplement  237 mL Oral BID BM   loratadine  10 mg Oral Daily   multivitamin  1 tablet Oral QHS   polyethylene glycol  17 g Oral Daily   potassium chloride  40 mEq Oral Q4H   pyridostigmine  60 mg Oral BID   rosuvastatin  5 mg Oral Daily   senna  1 tablet Oral Daily   sodium chloride flush  3 mL Intravenous Q12H   Suvorexant  15 mg Oral QHS   tamsulosin  0.4 mg Oral QPC supper   Continuous Infusions:   PRN Meds:.acetaminophen, bisacodyl, hydrALAZINE, melatonin, naphazoline-glycerin  Antimicrobials: Anti-infectives (From admission, onward)    Start     Dose/Rate Route Frequency Ordered Stop   08/23/21 1600  vancomycin (VANCOREADY) IVPB 1250 mg/250 mL  Status:  Discontinued        1,250 mg 166.7 mL/hr over 90 Minutes Intravenous Every 24 hours 08/22/21 1524 08/27/21 1305   08/22/21 1430  vancomycin (VANCOREADY) IVPB 1500 mg/300 mL        1,500 mg 150 mL/hr over 120 Minutes Intravenous  Once  08/22/21 1425 08/22/21 1749   08/22/21 1430  ceFEPIme (MAXIPIME) 2 g in sodium chloride 0.9 % 100 mL IVPB  Status:  Discontinued        2 g 200 mL/hr over 30 Minutes Intravenous Every 12 hours 08/22/21 1425 08/23/21 1354   08/22/21 1400  cefTRIAXone (ROCEPHIN) 1 g in sodium chloride 0.9 % 100 mL IVPB  Status:  Discontinued        1 g 200 mL/hr over 30 Minutes Intravenous  Once 08/22/21 1359 08/22/21 1359        I have personally reviewed the following labs and images: CBC: Recent Labs  Lab 08/22/21 1358 08/22/21 1425 08/24/21 0844 08/25/21 0718 08/26/21 0155 08/27/21 0142 08/28/21 0117  WBC 9.5   < > 11.4* 10.2 10.3 10.0 11.7*  NEUTROABS 8.8*  --   --   --   --   --   --   HGB 12.5*   < > 13.2 12.4* 12.2* 12.6* 12.1*  HCT 36.9*   < > 38.2* 35.6* 35.9* 37.2* 35.4*  MCV 90.2   < > 87.8 89.2 89.5 89.9 89.4  PLT  150   < > 157 152 166 172 173   < > = values in this interval not displayed.   BMP &GFR Recent Labs  Lab 08/24/21 0844 08/25/21 0718 08/26/21 0155 08/27/21 0142 08/28/21 0117  NA 141 139 138 138 138  K 3.3* 3.4* 3.5 3.7 3.4*  CL 111 108 106 109 107  CO2 23 24 26 24 26   GLUCOSE 99 93 102* 114* 101*  BUN 20 13 19 16 14   CREATININE 1.00 1.00 1.08 0.98 0.87  CALCIUM 8.6* 8.4* 8.2* 8.5* 8.2*  MG  --   --  1.8 1.9 1.8  PHOS  --   --   --  2.6 2.6   Estimated Creatinine Clearance: 71.1 mL/min (by C-G formula based on SCr of 0.87 mg/dL). Liver & Pancreas: Recent Labs  Lab 08/22/21 1358 08/27/21 0142 08/28/21 0117  AST 13*  --   --   ALT 11  --   --   ALKPHOS 59  --   --   BILITOT 0.9  --   --   PROT 4.4*  --   --   ALBUMIN 2.4* 2.4* 2.2*   No results for input(s): LIPASE, AMYLASE in the last 168 hours. Recent Labs  Lab 08/27/21 0142  AMMONIA 21   Diabetic: No results for input(s): HGBA1C in the last 72 hours. No results for input(s): GLUCAP in the last 168 hours. Cardiac Enzymes: Recent Labs  Lab 08/27/21 0142  CKTOTAL 32*   No results for input(s): PROBNP in the last 8760 hours. Coagulation Profile: Recent Labs  Lab 08/22/21 1358  INR 1.4*   Thyroid Function Tests: Recent Labs    08/27/21 0142  TSH 2.697   Lipid Profile: No results for input(s): CHOL, HDL, LDLCALC, TRIG, CHOLHDL, LDLDIRECT in the last 72 hours. Anemia Panel: No results for input(s): VITAMINB12, FOLATE, FERRITIN, TIBC, IRON, RETICCTPCT in the last 72 hours. Urine analysis:    Component Value Date/Time   COLORURINE AMBER (A) 08/22/2021 1358   APPEARANCEUR CLOUDY (A) 08/22/2021 1358   LABSPEC 1.025 08/22/2021 1358   PHURINE 5.0 08/22/2021 1358   GLUCOSEU NEGATIVE 08/22/2021 1358   HGBUR LARGE (A) 08/22/2021 1358   BILIRUBINUR NEGATIVE 08/22/2021 1358   KETONESUR 20 (A) 08/22/2021 1358   PROTEINUR 30 (A) 08/22/2021 1358   UROBILINOGEN 1 06/12/2014 1156   NITRITE  NEGATIVE  08/22/2021 1358   LEUKOCYTESUR TRACE (A) 08/22/2021 1358   Sepsis Labs: Invalid input(s): PROCALCITONIN, LACTICIDVEN  Microbiology: Recent Results (from the past 240 hour(s))  Urine Culture     Status: Abnormal   Collection Time: 08/18/21 11:54 PM   Specimen: In/Out Cath Urine  Result Value Ref Range Status   Specimen Description IN/OUT CATH URINE  Final   Special Requests   Final    Normal Performed at [facility], [address], [place], [place] [address]    Culture MULTIPLE SPECIES PRESENT, SUGGEST RECOLLECTION (A)  Final   Report Status 08/20/2021 FINAL  Final  Blood culture (routine single)     Status: None   Collection Time: 08/19/21 12:20 AM   Specimen: BLOOD  Result Value Ref Range Status   Specimen Description BLOOD RIGHT ANTECUBITAL  Final   Special Requests   Final    BOTTLES DRAWN AEROBIC AND ANAEROBIC Blood Culture results may not be optimal due to an inadequate volume of blood received in culture bottles   Culture   Final    NO GROWTH 5 DAYS Performed at [facility], [address], [place], [place] [address]    Report Status 08/24/2021 FINAL  Final  Resp Panel by RT-PCR (Flu A&B, Covid) Nasopharyngeal Swab     Status: None   Collection Time: 08/19/21  5:03 AM   Specimen: Nasopharyngeal Swab; Nasopharyngeal(NP) swabs in vial transport medium  Result Value Ref Range Status   SARS Coronavirus 2 by RT PCR NEGATIVE NEGATIVE Final    Comment: (NOTE) SARS-CoV-2 target nucleic acids are NOT DETECTED.  The SARS-CoV-2 RNA is generally detectable in upper respiratory specimens during the acute phase of infection. The lowest concentration of SARS-CoV-2 viral copies this assay can detect is 138 copies/mL. A negative result does not preclude SARS-Cov-2 infection and should not be used as the sole basis for treatment or other patient management decisions. A negative result may occur with  improper specimen collection/handling, submission of specimen  other than nasopharyngeal swab, presence of viral mutation(s) within the areas targeted by this assay, and inadequate number of viral copies(<138 copies/mL). A negative result must be combined with clinical observations, patient history, and epidemiological information. The expected result is Negative.  Fact Sheet for Patients:  BloggerCourse.com  Fact Sheet for Healthcare Providers:  SeriousBroker.it  This test is no t yet approved or cleared by the [place] FDA and  has been authorized for detection and/or diagnosis of SARS-CoV-2 by FDA under an Emergency Use Authorization (EUA). This EUA will remain  in effect (meaning this test can be used) for the duration of the COVID-19 declaration under Section 564(b)(1) of the Act, 21 U.S.C.section 360bbb-3(b)(1), unless the authorization is terminated  or revoked sooner.       Influenza A by PCR NEGATIVE NEGATIVE Final   Influenza B by PCR NEGATIVE NEGATIVE Final    Comment: (NOTE) The Xpert Xpress SARS-CoV-2/FLU/RSV plus assay is intended as an aid in the diagnosis of influenza from Nasopharyngeal swab specimens and should not be used as a sole basis for treatment. Nasal washings and aspirates are unacceptable for Xpert Xpress SARS-CoV-2/FLU/RSV testing.  Fact Sheet for Patients: BloggerCourse.com  Fact Sheet for Healthcare Providers: SeriousBroker.it  This test is not yet approved or cleared by the [place] FDA and has been authorized for detection and/or diagnosis of SARS-CoV-2 by FDA under an Emergency Use Authorization (EUA). This EUA will remain in effect (meaning this test can be  used) for the duration of the COVID-19 declaration under Section 564(b)(1) of the Act, 21 U.S.C. section 360bbb-3(b)(1), unless the authorization is terminated or revoked.  Performed at [facility], [address], [place],  [place] [address]   Resp Panel by RT-PCR (Flu A&B, Covid) Nasopharyngeal Swab     Status: None   Collection Time: 08/22/21  2:08 PM   Specimen: Nasopharyngeal Swab; Nasopharyngeal(NP) swabs in vial transport medium  Result Value Ref Range Status   SARS Coronavirus 2 by RT PCR NEGATIVE NEGATIVE Final    Comment: (NOTE) SARS-CoV-2 target nucleic acids are NOT DETECTED.  The SARS-CoV-2 RNA is generally detectable in upper respiratory specimens during the acute phase of infection. The lowest concentration of SARS-CoV-2 viral copies this assay can detect is 138 copies/mL. A negative result does not preclude SARS-Cov-2 infection and should not be used as the sole basis for treatment or other patient management decisions. A negative result may occur with  improper specimen collection/handling, submission of specimen other than nasopharyngeal swab, presence of viral mutation(s) within the areas targeted by this assay, and inadequate number of viral copies(<138 copies/mL). A negative result must be combined with clinical observations, patient history, and epidemiological information. The expected result is Negative.  Fact Sheet for Patients:  BloggerCourse.com  Fact Sheet for Healthcare Providers:  SeriousBroker.it  This test is no t yet approved or cleared by the [place] FDA and  has been authorized for detection and/or diagnosis of SARS-CoV-2 by FDA under an Emergency Use Authorization (EUA). This EUA will remain  in effect (meaning this test can be used) for the duration of the COVID-19 declaration under Section 564(b)(1) of the Act, 21 U.S.C.section 360bbb-3(b)(1), unless the authorization is terminated  or revoked sooner.       Influenza A by PCR NEGATIVE NEGATIVE Final   Influenza B by PCR NEGATIVE NEGATIVE Final    Comment: (NOTE) The Xpert Xpress SARS-CoV-2/FLU/RSV plus assay is intended as an aid in the diagnosis of influenza  from Nasopharyngeal swab specimens and should not be used as a sole basis for treatment. Nasal washings and aspirates are unacceptable for Xpert Xpress SARS-CoV-2/FLU/RSV testing.  Fact Sheet for Patients: BloggerCourse.com  Fact Sheet for Healthcare Providers: SeriousBroker.it  This test is not yet approved or cleared by the [place] FDA and has been authorized for detection and/or diagnosis of SARS-CoV-2 by FDA under an Emergency Use Authorization (EUA). This EUA will remain in effect (meaning this test can be used) for the duration of the COVID-19 declaration under Section 564(b)(1) of the Act, 21 U.S.C. section 360bbb-3(b)(1), unless the authorization is terminated or revoked.  Performed at [facility], [address], [place], [place] [address]   Urine Culture     Status: None   Collection Time: 08/22/21  2:08 PM   Specimen: In/Out Cath Urine  Result Value Ref Range Status   Specimen Description IN/OUT CATH URINE  Final   Special Requests NONE  Final   Culture   Final    NO GROWTH Performed at [facility], [address], [place], [place] [address]    Report Status 08/23/2021 FINAL  Final  Blood Culture (routine x 2)     Status: Abnormal   Collection Time: 08/22/21  2:10 PM   Specimen: BLOOD LEFT ARM  Result Value Ref Range Status   Specimen Description BLOOD LEFT ARM  Final   Special Requests   Final    BOTTLES DRAWN AEROBIC AND  ANAEROBIC Blood Culture results may not be optimal due to an excessive volume of blood received in culture bottles   Culture  Setup Time   Final    GRAM POSITIVE COCCI IN CLUSTERS IN BOTH AEROBIC AND ANAEROBIC BOTTLES CRITICAL RESULT CALLED TO, READ BACK BY AND VERIFIED WITH: E,BREWINGTON PHARMD @1115  08/23/21 EB    Culture (A)  Final    STAPHYLOCOCCUS EPIDERMIDIS STAPHYLOCOCCUS HOMINIS THE SIGNIFICANCE OF ISOLATING THIS ORGANISM FROM A SINGLE SET OF BLOOD CULTURES WHEN  MULTIPLE SETS ARE DRAWN IS UNCERTAIN. PLEASE NOTIFY THE MICROBIOLOGY DEPARTMENT WITHIN ONE WEEK IF SPECIATION AND SENSITIVITIES ARE REQUIRED. Performed at [facility], [address], [place], [place] [address]    Report Status 08/26/2021 FINAL  Final   Organism ID, Bacteria STAPHYLOCOCCUS EPIDERMIDIS  Final      Susceptibility   Staphylococcus epidermidis - MIC*    CIPROFLOXACIN >=8 RESISTANT Resistant     ERYTHROMYCIN >=8 RESISTANT Resistant     GENTAMICIN <=0.5 SENSITIVE Sensitive     OXACILLIN >=4 RESISTANT Resistant     TETRACYCLINE >=16 RESISTANT Resistant     VANCOMYCIN 1 SENSITIVE Sensitive     TRIMETH/SULFA 160 RESISTANT Resistant     CLINDAMYCIN <=0.25 SENSITIVE Sensitive     RIFAMPIN <=0.5 SENSITIVE Sensitive     Inducible Clindamycin NEGATIVE Sensitive     * STAPHYLOCOCCUS EPIDERMIDIS  Blood Culture ID Panel (Reflexed)     Status: Abnormal   Collection Time: 08/22/21  2:10 PM  Result Value Ref Range Status   Enterococcus faecalis NOT DETECTED NOT DETECTED Final   Enterococcus Faecium NOT DETECTED NOT DETECTED Final   Listeria monocytogenes NOT DETECTED NOT DETECTED Final   Staphylococcus species DETECTED (A) NOT DETECTED Final    Comment: CRITICAL RESULT CALLED TO, READ BACK BY AND VERIFIED WITH: E,BREWINGTON PHARMD @1115  08/23/21 EB    Staphylococcus aureus (BCID) NOT DETECTED NOT DETECTED Final   Staphylococcus epidermidis DETECTED (A) NOT DETECTED Final    Comment: Methicillin (oxacillin) resistant coagulase negative staphylococcus. Possible blood culture contaminant (unless isolated from more than one blood culture draw or clinical case suggests pathogenicity). No antibiotic treatment is indicated for blood  culture contaminants. CRITICAL RESULT CALLED TO, READ BACK BY AND VERIFIED WITH: E,BREWINGTON PHARMD @1115  08/23/21 EB    Staphylococcus lugdunensis NOT DETECTED NOT DETECTED Final   Streptococcus species NOT DETECTED NOT DETECTED Final   Streptococcus  agalactiae NOT DETECTED NOT DETECTED Final   Streptococcus pneumoniae NOT DETECTED NOT DETECTED Final   Streptococcus pyogenes NOT DETECTED NOT DETECTED Final   A.calcoaceticus-baumannii NOT DETECTED NOT DETECTED Final   Bacteroides fragilis NOT DETECTED NOT DETECTED Final   Enterobacterales NOT DETECTED NOT DETECTED Final   Enterobacter cloacae complex NOT DETECTED NOT DETECTED Final   Escherichia coli NOT DETECTED NOT DETECTED Final   Klebsiella aerogenes NOT DETECTED NOT DETECTED Final   Klebsiella oxytoca NOT DETECTED NOT DETECTED Final   Klebsiella pneumoniae NOT DETECTED NOT DETECTED Final   Proteus species NOT DETECTED NOT DETECTED Final   Salmonella species NOT DETECTED NOT DETECTED Final   Serratia marcescens NOT DETECTED NOT DETECTED Final   Haemophilus influenzae NOT DETECTED NOT DETECTED Final   Neisseria meningitidis NOT DETECTED NOT DETECTED Final   Pseudomonas aeruginosa NOT DETECTED NOT DETECTED Final   Stenotrophomonas maltophilia NOT DETECTED NOT DETECTED Final   Candida albicans NOT DETECTED NOT DETECTED Final   Candida auris NOT DETECTED NOT DETECTED Final   Candida glabrata NOT DETECTED NOT DETECTED Final   Candida  krusei NOT DETECTED NOT DETECTED Final   Candida parapsilosis NOT DETECTED NOT DETECTED Final   Candida tropicalis NOT DETECTED NOT DETECTED Final   Cryptococcus neoformans/gattii NOT DETECTED NOT DETECTED Final   Methicillin resistance mecA/C DETECTED (A) NOT DETECTED Final    Comment: CRITICAL RESULT CALLED TO, READ BACK BY AND VERIFIED WITH: E,BREWINGTON PHARMD @1115  08/23/21 EB Performed at [facility], [address] [place], [place] [address]   Blood Culture (routine x 2)     Status: Abnormal   Collection Time: 08/22/21  2:20 PM   Specimen: BLOOD RIGHT ARM  Result Value Ref Range Status   Specimen Description BLOOD RIGHT ARM  Final   Special Requests   Final    BOTTLES DRAWN AEROBIC AND ANAEROBIC Blood Culture adequate volume   Culture   Setup Time   Final    GRAM POSITIVE COCCI IN CLUSTERS IN BOTH AEROBIC AND ANAEROBIC BOTTLES CRITICAL VALUE NOTED.  VALUE IS CONSISTENT WITH PREVIOUSLY REPORTED AND CALLED VALUE. Performed at [facility], [address], [place], [place] [address]    Culture STAPHYLOCOCCUS EPIDERMIDIS (A)  Final   Report Status 08/28/2021 FINAL  Final   Organism ID, Bacteria STAPHYLOCOCCUS EPIDERMIDIS  Final      Susceptibility   Staphylococcus epidermidis - MIC*    CIPROFLOXACIN >=8 RESISTANT Resistant     ERYTHROMYCIN >=8 RESISTANT Resistant     GENTAMICIN <=0.5 SENSITIVE Sensitive     OXACILLIN >=4 RESISTANT Resistant     TETRACYCLINE >=16 RESISTANT Resistant     VANCOMYCIN 1 SENSITIVE Sensitive     TRIMETH/SULFA 160 RESISTANT Resistant     CLINDAMYCIN <=0.25 SENSITIVE Sensitive     RIFAMPIN <=0.5 SENSITIVE Sensitive     Inducible Clindamycin NEGATIVE Sensitive     * STAPHYLOCOCCUS EPIDERMIDIS  Culture, blood (routine x 2)     Status: None (Preliminary result)   Collection Time: 08/25/21  7:17 AM   Specimen: BLOOD  Result Value Ref Range Status   Specimen Description BLOOD RIGHT ANTECUBITAL  Final   Special Requests   Final    BOTTLES DRAWN AEROBIC AND ANAEROBIC Blood Culture adequate volume   Culture   Final    NO GROWTH 3 DAYS Performed at [facility], [address] [place], [place] [address]    Report Status PENDING  Incomplete  Culture, blood (routine x 2)     Status: None (Preliminary result)   Collection Time: 08/25/21  7:25 AM   Specimen: BLOOD  Result Value Ref Range Status   Specimen Description BLOOD RIGHT ANTECUBITAL  Final   Special Requests   Final    BOTTLES DRAWN AEROBIC AND ANAEROBIC Blood Culture adequate volume   Culture   Final    NO GROWTH 3 DAYS Performed at [facility], [address], [place], [place] [address]    Report Status PENDING  Incomplete    Radiology Studies: No results found.    Polette Nofsinger T. Alandria Butkiewicz [facility]  If  7PM-7AM, please contact night-coverage [email or web address] 08/28/2021, 1:00 PM

## 2021-08-28 NOTE — Progress Notes (Signed)
Physical Therapy Treatment Patient Details Name: Mathew Miller. MRN: [id number] DOB: June 14, 1946 Today's Date: 08/28/2021   History of Present Illness Mathew Miller. is a 75 y.o. male who presents with fever on 11/6 and found to have sepsis.  Previously admitted from 11/1-3 with the same, started on antibiotics, but no source was found and he was discharged with indwelling foley. PMH includes HTN; stage 3a CKD; advanced dementia; dysautonomia with postural hypotension; h/o CVA; and urinary retention with foley catheter placement, CVA.    PT Comments    Patient not progressing towards PT goals today. Pt resistant to all movement and no initiation noted during session today despite multi modal cues. Pt groaning in pain once sitting EOB and pushing posteriorly. Requires total A for all mobility at this time. Follows a few 1 step commands inconsistently. Difficult with communication. Per wife, pt not at cognitive baseline. It will be challenging for pt to progress/learn any new skills due to cognition. Encouraged wife presence, wake/sleep cycles, making environment appropriate for time of day etc. Recommend nursing maxi move pt to chair for OOB. Will follow.   Recommendations for follow up therapy are one component of a multi-disciplinary discharge planning process, led by the attending physician.  Recommendations may be updated based on patient status, additional functional criteria and insurance authorization.  Follow Up Recommendations  Skilled nursing-short term rehab (<3 hours/day)     Assistance Recommended at Discharge Frequent or constant Supervision/Assistance  Equipment Recommendations  Wheelchair cushion (measurements PT);Hospital bed;Other (comment);Wheelchair (measurements PT) (lift)    Recommendations for Other Services       Precautions / Restrictions Precautions Precautions: Fall Precaution Comments: Watch for signs of presyncope/syncope; history of orthostatic  hypotension Restrictions Weight Bearing Restrictions: No     Mobility  Bed Mobility Overal bed mobility: Needs Assistance Bed Mobility: Supine to Sit;Sit to Supine     Supine to sit: Total assist;+2 for physical assistance Sit to supine: Total assist;+2 for physical assistance   General bed mobility comments: pt did not initate any movement. Groaning in pain and pushing posteriorly/trying to lay back down, "you are pulling my arm off" and therapist not touching arm at all.    Transfers                   General transfer comment: Deferred    Ambulation/Gait               General Gait Details: Unable   Stairs             Wheelchair Mobility    Modified Rankin (Stroke Patients Only)       Balance Overall balance assessment: Needs assistance Sitting-balance support: Feet supported;No upper extremity supported Sitting balance-Leahy Scale: Poor Sitting balance - Comments: Max A of 1-2 for sitting, forward head posture with inability to lift head to gaze straight. Poor sitting tolerance due to pain in back. Pushing backwards to try to return to supine.                                    Cognition Arousal/Alertness: Lethargic;Awake/alert Behavior During Therapy: Flat affect Overall Cognitive Status: Impaired/Different from baseline Area of Impairment: Problem solving                     Memory: Decreased short-term memory       Problem Solving: Slow processing;Decreased initiation;Difficulty sequencing;Requires verbal  cues;Requires tactile cues General Comments: Difficulty communicating. Speech not making sense or on topic of questions asked. Poor initiation, sequencing. Hx of dementia. per wife, pt is not at cognitive baseline. Sleepy during session.        Exercises      General Comments General comments (skin integrity, edema, etc.): Wife present during session.      Pertinent Vitals/Pain Pain Assessment:  PAINAD Breathing: normal Negative Vocalization: occasional moan/groan, low speech, negative/disapproving quality Facial Expression: facial grimacing Body Language: tense, distressed pacing, fidgeting Consolability: unable to console, distract or reassure PAINAD Score: 6 Pain Location: back Pain Descriptors / Indicators: Grimacing;Guarding;Moaning Pain Intervention(s): Monitored during session;Limited activity within patient's tolerance    Home Living                          Prior Function            PT Goals (current goals can now be found in the care plan section) Progress towards PT goals: Not progressing toward goals - comment    Frequency    Min 2X/week      PT Plan Current plan remains appropriate    Co-evaluation              AM-PAC PT "6 Clicks" Mobility   Outcome Measure  Help needed turning from your back to your side while in a flat bed without using bedrails?: Total Help needed moving from lying on your back to sitting on the side of a flat bed without using bedrails?: Total Help needed moving to and from a bed to a chair (including a wheelchair)?: Total Help needed standing up from a chair using your arms (e.g., wheelchair or bedside chair)?: Total Help needed to walk in hospital room?: Total Help needed climbing 3-5 steps with a railing? : Total 6 Click Score: 6    End of Session   Activity Tolerance: Patient limited by pain Patient left: in bed;with call bell/phone within reach;with bed alarm set;with family/visitor present Nurse Communication: Mobility status;Need for lift equipment PT Visit Diagnosis: Muscle weakness (generalized) (M62.81);Difficulty in walking, not elsewhere classified (R26.2);Pain Pain - part of body:  (back)     Time: 6629-4765 PT Time Calculation (min) (ACUTE ONLY): 21 min  Charges:  $Therapeutic Activity: 8-22 mins                     Marisa Severin, PT, DPT Acute Rehabilitation Services Pager  [phone] Office [phone]      Marguarite Arbour A Sabra Heck 08/28/2021, 1:15 PM

## 2021-08-28 NOTE — Progress Notes (Signed)
This chaplain is present at the bedside with the Pt. wife-Mathew Miller.  The Pt. is awake and sitting up in bed. The care giving relationship is characterized by the Pt. calls out to Mathew Miller and Mathew Miller's consistent response.  The chaplain understands the Pt. has two choices for SNF, both represent distance challenges for [place]. The chaplain listens reflectively as Mathew Miller shares the challenges with her role as caregiver and the process of accepting the goal of SNF before the Pt. comes home. Mathew Miller is hopeful for respite for herself and a stronger Pt.  Mathew Miller is appreciative of the chaplain's visit and is anticipating d/c over the weekend.  Chaplain Sallyanne Kuster [phone]

## 2021-08-28 NOTE — TOC Progression Note (Signed)
Transition of Care (TOC) - Progression Note    Patient Details  Name: Mathew Miller. MRN: [id number] Date of Birth: December 29, 1945  Transition of Care [facility]) CM/SW Contact  Emeterio Reeve, [place] Phone Number: 08/28/2021, 1:19 PM  Clinical Narrative:     CSW spoke with pt and wife at bedside. [facility] at [facility]. Pt is managed by [facility] and facility will start auth. Pt will need a covid test proir to DC.  Expected Discharge Plan: [place] Barriers to Discharge: Continued Medical Work up, Ship broker, SNF Pending bed offer  Expected Discharge Plan and Services Expected Discharge Plan: [place] In-house Referral: Clinical Social Work   Post Acute Care Choice: [place] Living arrangements for the past 2 months: Single Family Home                                       Social Determinants of Health (SDOH) Interventions    Readmission Risk Interventions No flowsheet data found.  Emeterio Reeve, LCSW Clinical Social Worker

## 2021-08-29 DIAGNOSIS — G9341 Metabolic encephalopathy: Secondary | ICD-10-CM | POA: Diagnosis not present

## 2021-08-29 DIAGNOSIS — Z0000 Encounter for general adult medical examination without abnormal findings: Secondary | ICD-10-CM | POA: Diagnosis not present

## 2021-08-29 DIAGNOSIS — R7881 Bacteremia: Secondary | ICD-10-CM | POA: Diagnosis not present

## 2021-08-29 DIAGNOSIS — R509 Fever, unspecified: Secondary | ICD-10-CM | POA: Diagnosis not present

## 2021-08-29 LAB — CBC WITH DIFFERENTIAL/PLATELET
Abs Immature Granulocytes: 0.11 10*3/uL — ABNORMAL HIGH (ref 0.00–0.07)
Basophils Absolute: 0 10*3/uL (ref 0.0–0.1)
Basophils Relative: 0 %
Eosinophils Absolute: 0.2 10*3/uL (ref 0.0–0.5)
Eosinophils Relative: 2 %
HCT: 35.3 % — ABNORMAL LOW (ref 39.0–52.0)
Hemoglobin: 12.1 g/dL — ABNORMAL LOW (ref 13.0–17.0)
Immature Granulocytes: 1 %
Lymphocytes Relative: 13 %
Lymphs Abs: 1.5 10*3/uL (ref 0.7–4.0)
MCH: 30.6 pg (ref 26.0–34.0)
MCHC: 34.3 g/dL (ref 30.0–36.0)
MCV: 89.4 fL (ref 80.0–100.0)
Monocytes Absolute: 0.8 10*3/uL (ref 0.1–1.0)
Monocytes Relative: 7 %
Neutro Abs: 9.6 10*3/uL — ABNORMAL HIGH (ref 1.7–7.7)
Neutrophils Relative %: 77 %
Platelets: 196 10*3/uL (ref 150–400)
RBC: 3.95 MIL/uL — ABNORMAL LOW (ref 4.22–5.81)
RDW: 13.6 % (ref 11.5–15.5)
WBC: 12.3 10*3/uL — ABNORMAL HIGH (ref 4.0–10.5)
nRBC: 0 % (ref 0.0–0.2)

## 2021-08-29 LAB — RENAL FUNCTION PANEL
Albumin: 2.3 g/dL — ABNORMAL LOW (ref 3.5–5.0)
Anion gap: 7 (ref 5–15)
BUN: 15 mg/dL (ref 8–23)
CO2: 23 mmol/L (ref 22–32)
Calcium: 8.5 mg/dL — ABNORMAL LOW (ref 8.9–10.3)
Chloride: 106 mmol/L (ref 98–111)
Creatinine, Ser: 1 mg/dL (ref 0.61–1.24)
GFR, Estimated: >60 mL/min (ref >60)
Glucose, Bld: 87 mg/dL (ref 70–99)
Phosphorus: 2.5 mg/dL (ref 2.5–4.6)
Potassium: 3.9 mmol/L (ref 3.5–5.1)
Sodium: 136 mmol/L (ref 135–145)

## 2021-08-29 LAB — MAGNESIUM: Magnesium: 1.8 mg/dL (ref 1.7–2.4)

## 2021-08-29 NOTE — Progress Notes (Signed)
PROGRESS NOTE  Mathew Miller. NFA:[id number] DOB: 10/20/1945   PCP: Lucky Cowboy, MD  Patient is from: Home.  Lives with his wife.  Wheelchair and rolling walker at baseline.  Dependent for most ADLs  DOA: 08/22/2021 LOS: 6  Chief complaints:  Chief Complaint  Patient presents with   Code Sepsis     Brief Narrative / Interim history: 75 year old M with PMH of advanced dementia, CKD-3A, HTN, HLD, dysautonomia with postural hypotension, CVA, urinary retention with chronic Foley and recent hospitalization from 11/1-11/3 for SIRS returning with fever to 105 at home.  He had mild leukocytosis with fever to 101.9 in ED.  Cultures obtained.  Started on broad-spectrum antibiotics. Blood cultures with staph epidermis in 2 out of 2 bottles and staph hominis in 1 out of 2 bottles.  Antibiotics de-escalated to IV vancomycin.  ID consulted.  TTE did not show vegetation.  Repeat blood culture NGTD.  Had fever to 101.9 on admission but none since then.  No leukocytosis.  ID discontinued vancomycin on 11/10, and recommended observing off antibiotics.  However, patient spiked fever to 100.4 the night of 11/11.  Leukocytosis seems to be rising.  ID reconsulted recommended restarting IV vancomycin and TEE  if repeat blood culture positive for staph epi or patient continues to spike fever  Therapy recommends SNF.  Subjective: Seen and examined earlier this morning.  No major events overnight of this morning.  He spiked mild fever to 100.4 last night.  No other complaints but not a reliable historian.  Wife at bedside reported discomfort in his back.   Objective: Vitals:   08/28/21 1530 08/29/21 0538 08/29/21 0734 08/29/21 0840  BP: (!) 156/81 124/74 (!) 155/90   Pulse: 62 70 70   Resp: 18  20   Temp: 98.9 F (37.2 C) (!) 100.4 F (38 C) 98.9 F (37.2 C) 98.3 F (36.8 C)  TempSrc: Oral Oral Oral Axillary  SpO2: 100% 97% 99%   Weight:      Height:        Intake/Output Summary (Last 24  hours) at 08/29/2021 1349 Last data filed at 08/29/2021 0900 Gross per 24 hour  Intake 480 ml  Output 850 ml  Net -370 ml   Filed Weights   08/24/21 1134  Weight: 70.7 kg    Examination:  GENERAL: Frail looking elderly male.  No apparent distress. HEENT: MMM.  Vision and hearing grossly intact.  NECK: Supple.  No apparent JVD.  RESP: 99% on RA.  No IWOB.  Fair aeration bilaterally. CVS:  RRR. Heart sounds normal.  ABD/GI/GU: BS+. Abd soft, NTND.  MSK/EXT:  Moves extremities.  Some stiffness?  Resistance to sitting in bed or moving his arms and legs. SKIN: no apparent skin lesion or wound NEURO: Awake and alert. Oriented to self and family.  No apparent focal neuro deficit. PSYCH: Calm. Normal affect.   Procedures:  None  Microbiology summarized: 11/5-COVID-19 and influenza PCR nonreactive. 11/5- blood cultures with staph epidermis in 2 out of 2 bottles and staph hominis in 1 out of 2 bottles 11/8-blood cultures NGTD.  Assessment & Plan: Fever: reportedly febrile to 105 at home.  He was febrile to 101.9 in ED and to 100.4 last night.  Leukocytosis rising after initial improvement.  CXR and CT chest negative.  UA with WBC and RBC.  Urine culture NGTD.  LE [place] negative for DVT.  TTE and ESR negative.  Blood culture from 11/5 felt to be contaminant versus true bacteremia -  ID discontinued IV vancomycin and recommends observation off antibiotics on 11/10 -Reconsulted ID on 11/12 due to fever and rising leukocytosis.  -New ID recommendation-vancomycin and TEE if further fever or repeat blood culture positive. -Continue monitoring for leukocytosis and fever.  Acute metabolic encephalopathy in patient with advanced dementia:  Repeat CT head without acute finding.  Seems to be waxing and waning per patient's wife suggesting delirium.  -Treat treatable causes. -Reorientation and delirium precautions. -Palliative medicine consulted and following. -Plan for palliative follow-up at SNF    CKD-3A: Renal function better than his baseline. Recent Labs    08/18/21 1107 08/19/21 0300 08/22/21 1358 08/23/21 0721 08/24/21 0844 08/25/21 0718 08/26/21 0155 08/27/21 0142 08/28/21 0117 08/29/21 0111  BUN 31* 31* 24* 23 20 13 19 16 14 15   CREATININE 1.67* 1.47* 1.45* 1.28* 1.00 1.00 1.08 0.98 0.87 1.00  -Continue monitoring   Urinary retention: Unclear etiology.  Recent CT renal stone study showed multiple vascular clips at prostate and massive stool ball in rectum.  -Continue with foley catheter. Wife concerned about discontinuing Foley catheter. Understand infection risk -Manage constipation as below   History of CVA: No apparent focal neurodeficit but limited exam but limited exam.. -Resume aspirin for secondary prophylaxis.   Prolonged QT: QTc 538 but exaggerated by wide QRS/LBBB -Minimize and avoid QT prolonging drugs. -Optimize electrolytes   Hypokalemia: Resolved.   Lower back pain/stable L2 and L3 compression fracture deformities -Continue Tylenol as needed   Constipation: Stool ball noted on CT of 11/2. Per wife patient has had BM since discharge.  Seems to have resolved now. -Continue senna and MiraLAX daily  Physical deconditioning-patient is very deconditioned and dependent for most ADLs and transfer -PT/OT recommended SNF  Health maintenance -Received influenza vaccine on 11/9.  Allergic rhinitis with conjunctivitis-improved after Claritin and eyedrop. -Continue eyedrop and Claritin.   Moderate malnutrition Body mass index is 20.01 kg/m. Nutrition Problem: Moderate Malnutrition Etiology: chronic illness (Dementia) Signs/Symptoms: mild fat depletion, mild muscle depletion Interventions: Ensure Enlive (each supplement provides 350kcal and 20 grams of protein), MVI   DVT prophylaxis:  enoxaparin (LOVENOX) injection 40 mg Start: 08/22/21 1730  Code Status: DNR/DNI Family Communication: Updated patient's wife at bedside. Level of care: Telemetry  Medical Status is: Inpatient  Remains inpatient appropriate because: Further evaluation for fever and safe disposition/SNF   Consultants:  Infectious disease Palliative medicine   Sch Meds:  Scheduled Meds:  aspirin EC  81 mg Oral Daily   Chlorhexidine Gluconate Cloth  6 each Topical Daily   enoxaparin (LOVENOX) injection  40 mg Subcutaneous Q24H   feeding supplement  237 mL Oral BID BM   loratadine  10 mg Oral Daily   multivitamin  1 tablet Oral QHS   polyethylene glycol  17 g Oral Daily   pyridostigmine  60 mg Oral BID   rosuvastatin  5 mg Oral Daily   senna  1 tablet Oral Daily   sodium chloride flush  3 mL Intravenous Q12H   Suvorexant  15 mg Oral QHS   tamsulosin  0.4 mg Oral QPC supper   Continuous Infusions:   PRN Meds:.acetaminophen, bisacodyl, hydrALAZINE, melatonin, naphazoline-glycerin  Antimicrobials: Anti-infectives (From admission, onward)    Start     Dose/Rate Route Frequency Ordered Stop   08/23/21 1600  vancomycin (VANCOREADY) IVPB 1250 mg/250 mL  Status:  Discontinued        1,250 mg 166.7 mL/hr over 90 Minutes Intravenous Every 24 hours 08/22/21 1524 08/27/21 1305   08/22/21 1430  vancomycin (VANCOREADY) IVPB 1500 mg/300 mL        1,500 mg 150 mL/hr over 120 Minutes Intravenous  Once 08/22/21 1425 08/22/21 1749   08/22/21 1430  ceFEPIme (MAXIPIME) 2 g in sodium chloride 0.9 % 100 mL IVPB  Status:  Discontinued        2 g 200 mL/hr over 30 Minutes Intravenous Every 12 hours 08/22/21 1425 08/23/21 1354   08/22/21 1400  cefTRIAXone (ROCEPHIN) 1 g in sodium chloride 0.9 % 100 mL IVPB  Status:  Discontinued        1 g 200 mL/hr over 30 Minutes Intravenous  Once 08/22/21 1359 08/22/21 1359        I have personally reviewed the following labs and images: CBC: Recent Labs  Lab 08/25/21 0718 08/26/21 0155 08/27/21 0142 08/28/21 0117 08/29/21 0111  WBC 10.2 10.3 10.0 11.7* 12.3*  NEUTROABS  --   --   --   --  9.6*  HGB 12.4* 12.2* 12.6* 12.1*  12.1*  HCT 35.6* 35.9* 37.2* 35.4* 35.3*  MCV 89.2 89.5 89.9 89.4 89.4  PLT 152 166 172 173 196   BMP &GFR Recent Labs  Lab 08/25/21 0718 08/26/21 0155 08/27/21 0142 08/28/21 0117 08/29/21 0111  NA 139 138 138 138 136  K 3.4* 3.5 3.7 3.4* 3.9  CL 108 106 109 107 106  CO2 24 26 24 26 23   GLUCOSE 93 102* 114* 101* 87  BUN 13 19 16 14 15   CREATININE 1.00 1.08 0.98 0.87 1.00  CALCIUM 8.4* 8.2* 8.5* 8.2* 8.5*  MG  --  1.8 1.9 1.8 1.8  PHOS  --   --  2.6 2.6 2.5   Estimated Creatinine Clearance: 61.9 mL/min (by C-G formula based on SCr of 1 mg/dL). Liver & Pancreas: Recent Labs  Lab 08/27/21 0142 08/28/21 0117 08/29/21 0111  ALBUMIN 2.4* 2.2* 2.3*   No results for input(s): LIPASE, AMYLASE in the last 168 hours. Recent Labs  Lab 08/27/21 0142  AMMONIA 21   Diabetic: No results for input(s): HGBA1C in the last 72 hours. No results for input(s): GLUCAP in the last 168 hours. Cardiac Enzymes: Recent Labs  Lab 08/27/21 0142  CKTOTAL 32*   No results for input(s): PROBNP in the last 8760 hours. Coagulation Profile: No results for input(s): INR, PROTIME in the last 168 hours.  Thyroid Function Tests: Recent Labs    08/27/21 0142  TSH 2.697   Lipid Profile: No results for input(s): CHOL, HDL, LDLCALC, TRIG, CHOLHDL, LDLDIRECT in the last 72 hours. Anemia Panel: No results for input(s): VITAMINB12, FOLATE, FERRITIN, TIBC, IRON, RETICCTPCT in the last 72 hours. Urine analysis:    Component Value Date/Time   COLORURINE AMBER (A) 08/22/2021 1358   APPEARANCEUR CLOUDY (A) 08/22/2021 1358   LABSPEC 1.025 08/22/2021 1358   PHURINE 5.0 08/22/2021 1358   GLUCOSEU NEGATIVE 08/22/2021 1358   HGBUR LARGE (A) 08/22/2021 1358   BILIRUBINUR NEGATIVE 08/22/2021 1358   KETONESUR 20 (A) 08/22/2021 1358   PROTEINUR 30 (A) 08/22/2021 1358   UROBILINOGEN 1 06/12/2014 1156   NITRITE NEGATIVE 08/22/2021 1358   LEUKOCYTESUR TRACE (A) 08/22/2021 1358   Sepsis Labs: Invalid  input(s): PROCALCITONIN, LACTICIDVEN  Microbiology: Recent Results (from the past 240 hour(s))  Resp Panel by RT-PCR (Flu A&B, Covid) Nasopharyngeal Swab     Status: None   Collection Time: 08/22/21  2:08 PM   Specimen: Nasopharyngeal Swab; Nasopharyngeal(NP) swabs in vial transport medium  Result Value Ref Range Status  SARS Coronavirus 2 by RT PCR NEGATIVE NEGATIVE Final    Comment: (NOTE) SARS-CoV-2 target nucleic acids are NOT DETECTED.  The SARS-CoV-2 RNA is generally detectable in upper respiratory specimens during the acute phase of infection. The lowest concentration of SARS-CoV-2 viral copies this assay can detect is 138 copies/mL. A negative result does not preclude SARS-Cov-2 infection and should not be used as the sole basis for treatment or other patient management decisions. A negative result may occur with  improper specimen collection/handling, submission of specimen other than nasopharyngeal swab, presence of viral mutation(s) within the areas targeted by this assay, and inadequate number of viral copies(<138 copies/mL). A negative result must be combined with clinical observations, patient history, and epidemiological information. The expected result is Negative.  Fact Sheet for Patients:  BloggerCourse.com  Fact Sheet for Healthcare Providers:  SeriousBroker.it  This test is no t yet approved or cleared by the [place] FDA and  has been authorized for detection and/or diagnosis of SARS-CoV-2 by FDA under an Emergency Use Authorization (EUA). This EUA will remain  in effect (meaning this test can be used) for the duration of the COVID-19 declaration under Section 564(b)(1) of the Act, 21 U.S.C.section 360bbb-3(b)(1), unless the authorization is terminated  or revoked sooner.       Influenza A by PCR NEGATIVE NEGATIVE Final   Influenza B by PCR NEGATIVE NEGATIVE Final    Comment: (NOTE) The Xpert  Xpress SARS-CoV-2/FLU/RSV plus assay is intended as an aid in the diagnosis of influenza from Nasopharyngeal swab specimens and should not be used as a sole basis for treatment. Nasal washings and aspirates are unacceptable for Xpert Xpress SARS-CoV-2/FLU/RSV testing.  Fact Sheet for Patients: BloggerCourse.com  Fact Sheet for Healthcare Providers: SeriousBroker.it  This test is not yet approved or cleared by the [place] FDA and has been authorized for detection and/or diagnosis of SARS-CoV-2 by FDA under an Emergency Use Authorization (EUA). This EUA will remain in effect (meaning this test can be used) for the duration of the COVID-19 declaration under Section 564(b)(1) of the Act, 21 U.S.C. section 360bbb-3(b)(1), unless the authorization is terminated or revoked.  Performed at [facility], [address] [place], [place] [address]   Urine Culture     Status: None   Collection Time: 08/22/21  2:08 PM   Specimen: In/Out Cath Urine  Result Value Ref Range Status   Specimen Description IN/OUT CATH URINE  Final   Special Requests NONE  Final   Culture   Final    NO GROWTH Performed at [facility], [address], [place], [place] [address]    Report Status 08/23/2021 FINAL  Final  Blood Culture (routine x 2)     Status: Abnormal   Collection Time: 08/22/21  2:10 PM   Specimen: BLOOD LEFT ARM  Result Value Ref Range Status   Specimen Description BLOOD LEFT ARM  Final   Special Requests   Final    BOTTLES DRAWN AEROBIC AND ANAEROBIC Blood Culture results may not be optimal due to an excessive volume of blood received in culture bottles   Culture  Setup Time   Final    GRAM POSITIVE COCCI IN CLUSTERS IN BOTH AEROBIC AND ANAEROBIC BOTTLES CRITICAL RESULT CALLED TO, READ BACK BY AND VERIFIED WITH: E,BREWINGTON PHARMD @1115  08/23/21 EB    Culture (A)  Final    STAPHYLOCOCCUS EPIDERMIDIS STAPHYLOCOCCUS  HOMINIS THE SIGNIFICANCE OF ISOLATING THIS ORGANISM FROM A SINGLE SET OF BLOOD  CULTURES WHEN MULTIPLE SETS ARE DRAWN IS UNCERTAIN. PLEASE NOTIFY THE MICROBIOLOGY DEPARTMENT WITHIN ONE WEEK IF SPECIATION AND SENSITIVITIES ARE REQUIRED. Performed at [facility], [address] [place], [place] [address]    Report Status 08/26/2021 FINAL  Final   Organism ID, Bacteria STAPHYLOCOCCUS EPIDERMIDIS  Final      Susceptibility   Staphylococcus epidermidis - MIC*    CIPROFLOXACIN >=8 RESISTANT Resistant     ERYTHROMYCIN >=8 RESISTANT Resistant     GENTAMICIN <=0.5 SENSITIVE Sensitive     OXACILLIN >=4 RESISTANT Resistant     TETRACYCLINE >=16 RESISTANT Resistant     VANCOMYCIN 1 SENSITIVE Sensitive     TRIMETH/SULFA 160 RESISTANT Resistant     CLINDAMYCIN <=0.25 SENSITIVE Sensitive     RIFAMPIN <=0.5 SENSITIVE Sensitive     Inducible Clindamycin NEGATIVE Sensitive     * STAPHYLOCOCCUS EPIDERMIDIS  Blood Culture ID Panel (Reflexed)     Status: Abnormal   Collection Time: 08/22/21  2:10 PM  Result Value Ref Range Status   Enterococcus faecalis NOT DETECTED NOT DETECTED Final   Enterococcus Faecium NOT DETECTED NOT DETECTED Final   Listeria monocytogenes NOT DETECTED NOT DETECTED Final   Staphylococcus species DETECTED (A) NOT DETECTED Final    Comment: CRITICAL RESULT CALLED TO, READ BACK BY AND VERIFIED WITH: E,BREWINGTON PHARMD @1115  08/23/21 EB    Staphylococcus aureus (BCID) NOT DETECTED NOT DETECTED Final   Staphylococcus epidermidis DETECTED (A) NOT DETECTED Final    Comment: Methicillin (oxacillin) resistant coagulase negative staphylococcus. Possible blood culture contaminant (unless isolated from more than one blood culture draw or clinical case suggests pathogenicity). No antibiotic treatment is indicated for blood  culture contaminants. CRITICAL RESULT CALLED TO, READ BACK BY AND VERIFIED WITH: E,BREWINGTON PHARMD @1115  08/23/21 EB    Staphylococcus lugdunensis NOT DETECTED  NOT DETECTED Final   Streptococcus species NOT DETECTED NOT DETECTED Final   Streptococcus agalactiae NOT DETECTED NOT DETECTED Final   Streptococcus pneumoniae NOT DETECTED NOT DETECTED Final   Streptococcus pyogenes NOT DETECTED NOT DETECTED Final   A.calcoaceticus-baumannii NOT DETECTED NOT DETECTED Final   Bacteroides fragilis NOT DETECTED NOT DETECTED Final   Enterobacterales NOT DETECTED NOT DETECTED Final   Enterobacter cloacae complex NOT DETECTED NOT DETECTED Final   Escherichia coli NOT DETECTED NOT DETECTED Final   Klebsiella aerogenes NOT DETECTED NOT DETECTED Final   Klebsiella oxytoca NOT DETECTED NOT DETECTED Final   Klebsiella pneumoniae NOT DETECTED NOT DETECTED Final   Proteus species NOT DETECTED NOT DETECTED Final   Salmonella species NOT DETECTED NOT DETECTED Final   Serratia marcescens NOT DETECTED NOT DETECTED Final   Haemophilus influenzae NOT DETECTED NOT DETECTED Final   Neisseria meningitidis NOT DETECTED NOT DETECTED Final   Pseudomonas aeruginosa NOT DETECTED NOT DETECTED Final   Stenotrophomonas maltophilia NOT DETECTED NOT DETECTED Final   Candida albicans NOT DETECTED NOT DETECTED Final   Candida auris NOT DETECTED NOT DETECTED Final   Candida glabrata NOT DETECTED NOT DETECTED Final   Candida krusei NOT DETECTED NOT DETECTED Final   Candida parapsilosis NOT DETECTED NOT DETECTED Final   Candida tropicalis NOT DETECTED NOT DETECTED Final   Cryptococcus neoformans/gattii NOT DETECTED NOT DETECTED Final   Methicillin resistance mecA/C DETECTED (A) NOT DETECTED Final    Comment: CRITICAL RESULT CALLED TO, READ BACK BY AND VERIFIED WITH: E,BREWINGTON PHARMD @1115  08/23/21 EB Performed at [facility], [address] [place], [place] [address]   Blood Culture (routine x 2)  Status: Abnormal   Collection Time: 08/22/21  2:20 PM   Specimen: BLOOD RIGHT ARM  Result Value Ref Range Status   Specimen Description BLOOD RIGHT ARM  Final   Special  Requests   Final    BOTTLES DRAWN AEROBIC AND ANAEROBIC Blood Culture adequate volume   Culture  Setup Time   Final    GRAM POSITIVE COCCI IN CLUSTERS IN BOTH AEROBIC AND ANAEROBIC BOTTLES CRITICAL VALUE NOTED.  VALUE IS CONSISTENT WITH PREVIOUSLY REPORTED AND CALLED VALUE. Performed at [facility], [address], [place], [place] [address]    Culture STAPHYLOCOCCUS EPIDERMIDIS (A)  Final   Report Status 08/28/2021 FINAL  Final   Organism ID, Bacteria STAPHYLOCOCCUS EPIDERMIDIS  Final      Susceptibility   Staphylococcus epidermidis - MIC*    CIPROFLOXACIN >=8 RESISTANT Resistant     ERYTHROMYCIN >=8 RESISTANT Resistant     GENTAMICIN <=0.5 SENSITIVE Sensitive     OXACILLIN >=4 RESISTANT Resistant     TETRACYCLINE >=16 RESISTANT Resistant     VANCOMYCIN 1 SENSITIVE Sensitive     TRIMETH/SULFA 160 RESISTANT Resistant     CLINDAMYCIN <=0.25 SENSITIVE Sensitive     RIFAMPIN <=0.5 SENSITIVE Sensitive     Inducible Clindamycin NEGATIVE Sensitive     * STAPHYLOCOCCUS EPIDERMIDIS  Culture, blood (routine x 2)     Status: None (Preliminary result)   Collection Time: 08/25/21  7:17 AM   Specimen: BLOOD  Result Value Ref Range Status   Specimen Description BLOOD RIGHT ANTECUBITAL  Final   Special Requests   Final    BOTTLES DRAWN AEROBIC AND ANAEROBIC Blood Culture adequate volume   Culture   Final    NO GROWTH 4 DAYS Performed at [facility], [address] [place], [place] [address]    Report Status PENDING  Incomplete  Culture, blood (routine x 2)     Status: None (Preliminary result)   Collection Time: 08/25/21  7:25 AM   Specimen: BLOOD  Result Value Ref Range Status   Specimen Description BLOOD RIGHT ANTECUBITAL  Final   Special Requests   Final    BOTTLES DRAWN AEROBIC AND ANAEROBIC Blood Culture adequate volume   Culture   Final    NO GROWTH 4 DAYS Performed at [facility], [address] [place], [place] [address]    Report Status PENDING   Incomplete    Radiology Studies: No results found.    Omar Gayden T. Zoe Goonan [facility]  If 7PM-7AM, please contact night-coverage [email or web address] 08/29/2021, 1:49 PM

## 2021-08-29 NOTE — Progress Notes (Signed)
[facility]  Date of Admission:  08/22/2021      Lines: Peripheral iv Urinary catheter  Abx: 11/05-11/10 vanc 11/05-06 cefepime  ASSESSMENT: Fever  75 yo male advanced dementia, ckd3, htn, hlp, dysautonomia/postural hypotension, hx cva, urinary retention with chronic foley, admitted with sepsis  W/u initialy with bcx 2 of 2 bottles with staph epi and 1 of 2 with staph hominis. 11/08 repeat bcx negative Tte no obvious vegetation ID previous recs to observe off abx (vanc 11/05-11/10 of 6 days)  Patient redeveloped fever immediately off abx with rising leukocytosis.   Patient without rash/eosinophilia/aki or other localizing infectious syndrome or drug fever. Lft last checked 11/05 normal   PLAN: Please repeat bcx If continued fever, or positive bcx for staph epi, would add back vancomycin iv and get TEE Will follow Discussed with primary team    Principal Problem:   Fever Active Problems:   Hyperlipidemia   Goals of care, counseling/discussion   CVA (cerebral vascular accident) ([place])   Dementia ([place])   Prolonged QT interval   Acute metabolic encephalopathy   Staphylococcus epidermidis bacteremia   Malnutrition of moderate degree   Altered mental status   Allergies  Allergen Reactions   Hydrocodone-Acetaminophen Nausea Only   Sudafed [Pseudoephedrine Hcl]     Urinary retention   Tetanus Toxoids     Itching, burning feeling, ran a fever    Scheduled Meds:  aspirin EC  81 mg Oral Daily   Chlorhexidine Gluconate Cloth  6 each Topical Daily   enoxaparin (LOVENOX) injection  40 mg Subcutaneous Q24H   feeding supplement  237 mL Oral BID BM   loratadine  10 mg Oral Daily   multivitamin  1 tablet Oral QHS   polyethylene glycol  17 g Oral Daily   pyridostigmine  60 mg Oral BID   rosuvastatin  5 mg Oral Daily   senna  1 tablet Oral Daily   sodium chloride flush  3 mL Intravenous Q12H   Suvorexant  15 mg Oral QHS    tamsulosin  0.4 mg Oral QPC supper   Continuous Infusions: PRN Meds:.acetaminophen, bisacodyl, hydrALAZINE, melatonin, naphazoline-glycerin   SUBJECTIVE: No complaint within contest of patient's being demented. Per chart, his wife also expresses no concern for him.  No rash/diarrhea Low grade temperature 100.4 on 11/11 Wbc mildly up @ 12  Slightly hypertensive but stable No supplemental o2 required  Review of Systems: ROS All other ROS was negative, except mentioned above     OBJECTIVE: Vitals:   08/28/21 1530 08/29/21 0538 08/29/21 0734 08/29/21 0840  BP: (!) 156/81 124/74 (!) 155/90   Pulse: 62 70 70   Resp: 18  20   Temp: 98.9 F (37.2 C) (!) 100.4 F (38 C) 98.9 F (37.2 C) 98.3 F (36.8 C)  TempSrc: Oral Oral Oral Axillary  SpO2: 100% 97% 99%   Weight:      Height:       Body mass index is 20.01 kg/m.  Physical Exam  General/constitutional: no distress, pleasant HEENT: Normocephalic, PER, Conj Clear, EOMI Neck supple CV: rrr no mrg Lungs: clear to auscultation, normal respiratory effort Abd: Soft, Nontender Ext: no edema Skin: No Rash Neuro/psych: oriented to self; no focal deficit otherwise MSK: no peripheral joint swelling/tenderness/warmth    Lab Results Lab Results  Component Value Date   WBC 12.3 (H) 08/29/2021   HGB 12.1 (L) 08/29/2021   HCT 35.3 (L) 08/29/2021  MCV 89.4 08/29/2021   PLT 196 08/29/2021    Lab Results  Component Value Date   CREATININE 1.00 08/29/2021   BUN 15 08/29/2021   NA 136 08/29/2021   K 3.9 08/29/2021   CL 106 08/29/2021   CO2 23 08/29/2021    Lab Results  Component Value Date   ALT 11 08/22/2021   AST 13 (L) 08/22/2021   ALKPHOS 59 08/22/2021   BILITOT 0.9 08/22/2021      Microbiology: Recent Results (from the past 240 hour(s))  Resp Panel by RT-PCR (Flu A&B, Covid) Nasopharyngeal Swab     Status: None   Collection Time: 08/22/21  2:08 PM   Specimen: Nasopharyngeal Swab; Nasopharyngeal(NP)  swabs in vial transport medium  Result Value Ref Range Status   SARS Coronavirus 2 by RT PCR NEGATIVE NEGATIVE Final    Comment: (NOTE) SARS-CoV-2 target nucleic acids are NOT DETECTED.  The SARS-CoV-2 RNA is generally detectable in upper respiratory specimens during the acute phase of infection. The lowest concentration of SARS-CoV-2 viral copies this assay can detect is 138 copies/mL. A negative result does not preclude SARS-Cov-2 infection and should not be used as the sole basis for treatment or other patient management decisions. A negative result may occur with  improper specimen collection/handling, submission of specimen other than nasopharyngeal swab, presence of viral mutation(s) within the areas targeted by this assay, and inadequate number of viral copies(<138 copies/mL). A negative result must be combined with clinical observations, patient history, and epidemiological information. The expected result is Negative.  Fact Sheet for Patients:  EntrepreneurPulse.com.au  Fact Sheet for Healthcare Providers:  IncredibleEmployment.be  This test is no t yet approved or cleared by the [place] FDA and  has been authorized for detection and/or diagnosis of SARS-CoV-2 by FDA under an Emergency Use Authorization (EUA). This EUA will remain  in effect (meaning this test can be used) for the duration of the COVID-19 declaration under Section 564(b)(1) of the Act, 21 U.S.C.section 360bbb-3(b)(1), unless the authorization is terminated  or revoked sooner.       Influenza A by PCR NEGATIVE NEGATIVE Final   Influenza B by PCR NEGATIVE NEGATIVE Final    Comment: (NOTE) The Xpert Xpress SARS-CoV-2/FLU/RSV plus assay is intended as an aid in the diagnosis of influenza from Nasopharyngeal swab specimens and should not be used as a sole basis for treatment. Nasal washings and aspirates are unacceptable for Xpert Xpress  SARS-CoV-2/FLU/RSV testing.  Fact Sheet for Patients: EntrepreneurPulse.com.au  Fact Sheet for Healthcare Providers: IncredibleEmployment.be  This test is not yet approved or cleared by the [place] FDA and has been authorized for detection and/or diagnosis of SARS-CoV-2 by FDA under an Emergency Use Authorization (EUA). This EUA will remain in effect (meaning this test can be used) for the duration of the COVID-19 declaration under Section 564(b)(1) of the Act, 21 U.S.C. section 360bbb-3(b)(1), unless the authorization is terminated or revoked.  Performed at [facility], [place] [address] [place], [place] [address]   Urine Culture     Status: None   Collection Time: 08/22/21  2:08 PM   Specimen: In/Out Cath Urine  Result Value Ref Range Status   Specimen Description IN/OUT CATH URINE  Final   Special Requests NONE  Final   Culture   Final    NO GROWTH Performed at [facility], [place] [address], [place],  [address]    Report Status 08/23/2021 FINAL  Final  Blood Culture (routine x  2)     Status: Abnormal   Collection Time: 08/22/21  2:10 PM   Specimen: BLOOD LEFT ARM  Result Value Ref Range Status   Specimen Description BLOOD LEFT ARM  Final   Special Requests   Final    BOTTLES DRAWN AEROBIC AND ANAEROBIC Blood Culture results may not be optimal due to an excessive volume of blood received in culture bottles   Culture  Setup Time   Final    GRAM POSITIVE COCCI IN CLUSTERS IN BOTH AEROBIC AND ANAEROBIC BOTTLES CRITICAL RESULT CALLED TO, READ BACK BY AND VERIFIED WITH: E,BREWINGTON PHARMD @1115  08/23/21 EB    Culture (A)  Final    STAPHYLOCOCCUS EPIDERMIDIS STAPHYLOCOCCUS HOMINIS THE SIGNIFICANCE OF ISOLATING THIS ORGANISM FROM A SINGLE SET OF BLOOD CULTURES WHEN MULTIPLE SETS ARE DRAWN IS UNCERTAIN. PLEASE NOTIFY THE MICROBIOLOGY DEPARTMENT WITHIN ONE WEEK IF SPECIATION AND SENSITIVITIES ARE REQUIRED. Performed at  [facility], [place] [address], [place], [place] [address]    Report Status 08/26/2021 FINAL  Final   Organism ID, Bacteria STAPHYLOCOCCUS EPIDERMIDIS  Final      Susceptibility   Staphylococcus epidermidis - MIC*    CIPROFLOXACIN >=8 RESISTANT Resistant     ERYTHROMYCIN >=8 RESISTANT Resistant     GENTAMICIN <=0.5 SENSITIVE Sensitive     OXACILLIN >=4 RESISTANT Resistant     TETRACYCLINE >=16 RESISTANT Resistant     VANCOMYCIN 1 SENSITIVE Sensitive     TRIMETH/SULFA 160 RESISTANT Resistant     CLINDAMYCIN <=0.25 SENSITIVE Sensitive     RIFAMPIN <=0.5 SENSITIVE Sensitive     Inducible Clindamycin NEGATIVE Sensitive     * STAPHYLOCOCCUS EPIDERMIDIS  Blood Culture ID Panel (Reflexed)     Status: Abnormal   Collection Time: 08/22/21  2:10 PM  Result Value Ref Range Status   Enterococcus faecalis NOT DETECTED NOT DETECTED Final   Enterococcus Faecium NOT DETECTED NOT DETECTED Final   Listeria monocytogenes NOT DETECTED NOT DETECTED Final   Staphylococcus species DETECTED (A) NOT DETECTED Final    Comment: CRITICAL RESULT CALLED TO, READ BACK BY AND VERIFIED WITH: E,BREWINGTON PHARMD @1115  08/23/21 EB    Staphylococcus aureus (BCID) NOT DETECTED NOT DETECTED Final   Staphylococcus epidermidis DETECTED (A) NOT DETECTED Final    Comment: Methicillin (oxacillin) resistant coagulase negative staphylococcus. Possible blood culture contaminant (unless isolated from more than one blood culture draw or clinical case suggests pathogenicity). No antibiotic treatment is indicated for blood  culture contaminants. CRITICAL RESULT CALLED TO, READ BACK BY AND VERIFIED WITH: E,BREWINGTON PHARMD @1115  08/23/21 EB    Staphylococcus lugdunensis NOT DETECTED NOT DETECTED Final   Streptococcus species NOT DETECTED NOT DETECTED Final   Streptococcus agalactiae NOT DETECTED NOT DETECTED Final   Streptococcus pneumoniae NOT DETECTED NOT DETECTED Final   Streptococcus pyogenes NOT DETECTED NOT DETECTED  Final   A.calcoaceticus-baumannii NOT DETECTED NOT DETECTED Final   Bacteroides fragilis NOT DETECTED NOT DETECTED Final   Enterobacterales NOT DETECTED NOT DETECTED Final   Enterobacter cloacae complex NOT DETECTED NOT DETECTED Final   Escherichia coli NOT DETECTED NOT DETECTED Final   Klebsiella aerogenes NOT DETECTED NOT DETECTED Final   Klebsiella oxytoca NOT DETECTED NOT DETECTED Final   Klebsiella pneumoniae NOT DETECTED NOT DETECTED Final   Proteus species NOT DETECTED NOT DETECTED Final   Salmonella species NOT DETECTED NOT DETECTED Final   Serratia marcescens NOT DETECTED NOT DETECTED Final   Haemophilus influenzae NOT DETECTED NOT DETECTED Final   Neisseria meningitidis NOT DETECTED NOT  DETECTED Final   Pseudomonas aeruginosa NOT DETECTED NOT DETECTED Final   Stenotrophomonas maltophilia NOT DETECTED NOT DETECTED Final   Candida albicans NOT DETECTED NOT DETECTED Final   Candida auris NOT DETECTED NOT DETECTED Final   Candida glabrata NOT DETECTED NOT DETECTED Final   Candida krusei NOT DETECTED NOT DETECTED Final   Candida parapsilosis NOT DETECTED NOT DETECTED Final   Candida tropicalis NOT DETECTED NOT DETECTED Final   Cryptococcus neoformans/gattii NOT DETECTED NOT DETECTED Final   Methicillin resistance mecA/C DETECTED (A) NOT DETECTED Final    Comment: CRITICAL RESULT CALLED TO, READ BACK BY AND VERIFIED WITH: E,BREWINGTON PHARMD @1115  08/23/21 EB Performed at [place] [address] [place], [place] [address]   Blood Culture (routine x 2)     Status: Abnormal   Collection Time: 08/22/21  2:20 PM   Specimen: BLOOD RIGHT ARM  Result Value Ref Range Status   Specimen Description BLOOD RIGHT ARM  Final   Special Requests   Final    BOTTLES DRAWN AEROBIC AND ANAEROBIC Blood Culture adequate volume   Culture  Setup Time   Final    GRAM POSITIVE COCCI IN CLUSTERS IN BOTH AEROBIC AND ANAEROBIC BOTTLES CRITICAL VALUE NOTED.  VALUE IS CONSISTENT WITH PREVIOUSLY  REPORTED AND CALLED VALUE. Performed at [facility], [place] [address], [place], [place] [address]    Culture STAPHYLOCOCCUS EPIDERMIDIS (A)  Final   Report Status 08/28/2021 FINAL  Final   Organism ID, Bacteria STAPHYLOCOCCUS EPIDERMIDIS  Final      Susceptibility   Staphylococcus epidermidis - MIC*    CIPROFLOXACIN >=8 RESISTANT Resistant     ERYTHROMYCIN >=8 RESISTANT Resistant     GENTAMICIN <=0.5 SENSITIVE Sensitive     OXACILLIN >=4 RESISTANT Resistant     TETRACYCLINE >=16 RESISTANT Resistant     VANCOMYCIN 1 SENSITIVE Sensitive     TRIMETH/SULFA 160 RESISTANT Resistant     CLINDAMYCIN <=0.25 SENSITIVE Sensitive     RIFAMPIN <=0.5 SENSITIVE Sensitive     Inducible Clindamycin NEGATIVE Sensitive     * STAPHYLOCOCCUS EPIDERMIDIS  Culture, blood (routine x 2)     Status: None (Preliminary result)   Collection Time: 08/25/21  7:17 AM   Specimen: BLOOD  Result Value Ref Range Status   Specimen Description BLOOD RIGHT ANTECUBITAL  Final   Special Requests   Final    BOTTLES DRAWN AEROBIC AND ANAEROBIC Blood Culture adequate volume   Culture   Final    NO GROWTH 4 DAYS Performed at [facility], [address] [place], [place] [address]    Report Status PENDING  Incomplete  Culture, blood (routine x 2)     Status: None (Preliminary result)   Collection Time: 08/25/21  7:25 AM   Specimen: BLOOD  Result Value Ref Range Status   Specimen Description BLOOD RIGHT ANTECUBITAL  Final   Special Requests   Final    BOTTLES DRAWN AEROBIC AND ANAEROBIC Blood Culture adequate volume   Culture   Final    NO GROWTH 4 DAYS Performed at [facility], [place] [address] [place], [place] [address]    Report Status PENDING  Incomplete     Serology:   Imaging: If present, new imagings (plain films, ct scans, and mri) have been personally visualized and interpreted; radiology reports have been reviewed. Decision making incorporated into the Impression /  Recommendations.   Jabier Mutton, [place] [facility] [phone] pager    08/29/2021, 1:32 PM

## 2021-08-30 DIAGNOSIS — G9341 Metabolic encephalopathy: Secondary | ICD-10-CM | POA: Diagnosis not present

## 2021-08-30 DIAGNOSIS — R509 Fever, unspecified: Secondary | ICD-10-CM | POA: Diagnosis not present

## 2021-08-30 DIAGNOSIS — Z0000 Encounter for general adult medical examination without abnormal findings: Secondary | ICD-10-CM | POA: Diagnosis not present

## 2021-08-30 DIAGNOSIS — R7881 Bacteremia: Secondary | ICD-10-CM | POA: Diagnosis not present

## 2021-08-30 LAB — CBC
HCT: 35.7 % — ABNORMAL LOW (ref 39.0–52.0)
Hemoglobin: 12.1 g/dL — ABNORMAL LOW (ref 13.0–17.0)
MCH: 30.4 pg (ref 26.0–34.0)
MCHC: 33.9 g/dL (ref 30.0–36.0)
MCV: 89.7 fL (ref 80.0–100.0)
Platelets: 213 10*3/uL (ref 150–400)
RBC: 3.98 MIL/uL — ABNORMAL LOW (ref 4.22–5.81)
RDW: 14 % (ref 11.5–15.5)
WBC: 10.1 10*3/uL (ref 4.0–10.5)
nRBC: 0 % (ref 0.0–0.2)

## 2021-08-30 LAB — RENAL FUNCTION PANEL
Albumin: 2.4 g/dL — ABNORMAL LOW (ref 3.5–5.0)
Anion gap: 8 (ref 5–15)
BUN: 15 mg/dL (ref 8–23)
CO2: 25 mmol/L (ref 22–32)
Calcium: 8.6 mg/dL — ABNORMAL LOW (ref 8.9–10.3)
Chloride: 106 mmol/L (ref 98–111)
Creatinine, Ser: 1 mg/dL (ref 0.61–1.24)
GFR, Estimated: >60 mL/min (ref >60)
Glucose, Bld: 94 mg/dL (ref 70–99)
Phosphorus: 3.6 mg/dL (ref 2.5–4.6)
Potassium: 3.7 mmol/L (ref 3.5–5.1)
Sodium: 139 mmol/L (ref 135–145)

## 2021-08-30 LAB — CULTURE, BLOOD (ROUTINE X 2)
Culture: NO GROWTH
Culture: NO GROWTH
Special Requests: ADEQUATE
Special Requests: ADEQUATE

## 2021-08-30 LAB — MAGNESIUM: Magnesium: 1.9 mg/dL (ref 1.7–2.4)

## 2021-08-30 NOTE — Progress Notes (Signed)
PROGRESS NOTE  Mathew Miller. WUJ:[id number] DOB: [birth year]   PCP: Lucky Cowboy, MD  Patient is from: Home.  Lives with his wife.  Wheelchair and rolling walker at baseline.  Dependent for most ADLs  DOA: 08/22/2021 LOS: 7  Chief complaints:  Chief Complaint  Patient presents with   Code Sepsis     Brief Narrative / Interim history: 75 year old M with PMH of advanced dementia, CKD-3A, HTN, HLD, dysautonomia with postural hypotension, CVA, urinary retention with chronic Foley and recent hospitalization from 11/1-11/3 for SIRS returning with fever to 105 at home.  He had mild leukocytosis with fever to 101.9 in ED.  Cultures obtained.  Started on broad-spectrum antibiotics. Blood cultures with staph epidermis in 2 out of 2 bottles and staph hominis in 1 out of 2 bottles.  Antibiotics de-escalated to IV vancomycin.  ID consulted.  TTE did not show vegetation.  Repeat blood culture NGTD.  Had fever to 101.9 on admission but none since then.  No leukocytosis.  ID discontinued vancomycin on 11/10, and recommended observing off antibiotics.   Patient spiked fever to 100.4 the morning of 11/12.  ID reconsulted recommended repeat blood culture and monitoring of antibiotics unless fever recurs or blood culture positive.  Therapy recommends SNF.  Subjective: Seen and examined earlier this morning.  No major events overnight of this morning.  A little sleepy this morning.  Patient's wife at bedside.  No concerns or complaints.  Objective: Vitals:   08/29/21 0840 08/29/21 1550 08/29/21 1947 08/30/21 0753  BP:  (!) 144/87 (!) 113/59 132/83  Pulse:  65 65 70  Resp:  16 17 18   Temp: 98.3 F (36.8 C) 98.5 F (36.9 C) 98.2 F (36.8 C) 98.2 F (36.8 C)  TempSrc: Axillary Oral Oral Oral  SpO2:   98% 99%  Weight:      Height:        Intake/Output Summary (Last 24 hours) at 08/30/2021 1336 Last data filed at 08/30/2021 1022 Gross per 24 hour  Intake 360 ml  Output 225 ml  Net 135 ml    Filed Weights   08/24/21 1134  Weight: 70.7 kg    Examination:  GENERAL: Frail looking elderly male.  No apparent distress.  Sleepy. HEENT: MMM.  Vision and hearing grossly intact.  NECK: Supple.  No apparent JVD.  RESP: 99% on RA.  No IWOB.  Fair aeration bilaterally. CVS:  RRR. Heart sounds normal.  ABD/GI/GU: BS+. Abd soft, NTND.  MSK/EXT:  Moves extremities. No apparent deformity. No edema.  SKIN: no apparent skin lesion or wound NEURO: Sleepy but wakes to voice.  No apparent focal neuro deficit but limited exam due to mental status.Marland Kitchen PSYCH: Calm. Normal affect.   Procedures:  None  Microbiology summarized: 11/5-COVID-19 and influenza PCR nonreactive. 11/5- blood cultures with staph epidermis in 2 out of 2 bottles and staph hominis in 1 out of 2 bottles 11/8-blood cultures NGTD. 11/12-blood cultures NGTD.  Assessment & Plan: Fever: reportedly febrile to 105 at home.  He was febrile to 101.9 in ED and to 100.4 last night.  Leukocytosis rising after initial improvement.  CXR and CT chest negative.  UA with WBC and RBC.  Urine culture NGTD.  LE [place] negative for DVT.  TTE and ESR negative.  Blood culture from 11/5 felt to be contaminant versus true bacteremia.  ID discontinued IV vancomycin and recommends observation off antibiotics on 11/10.  Spiked mild fever to 100.5 on 11/12.  ID recommended repeat blood  culture and watching off antibiotics unless he continues to spike fever or blood cultures positive.  Leukocytosis improving. -Continue monitoring  Acute metabolic encephalopathy in patient with advanced dementia:  Repeat CT head without acute finding.  Seems to be waxing and waning per patient's wife suggesting delirium.  -Treat treatable causes. -Reorientation and delirium precautions. -Palliative medicine consulted and following. -Plan for palliative follow-up at SNF   CKD-3A: Renal function better than his baseline. Recent Labs    08/19/21 0300 08/22/21 1358  08/23/21 0721 08/24/21 0844 08/25/21 0718 08/26/21 0155 08/27/21 0142 08/28/21 0117 08/29/21 0111 08/30/21 0222  BUN 31* 24* 23 20 13 19 16 14 15 15   CREATININE 1.47* 1.45* 1.28* 1.00 1.00 1.08 0.98 0.87 1.00 1.00  -Continue monitoring   Urinary retention: Unclear etiology.  Recent CT renal stone study showed multiple vascular clips at prostate and massive stool ball in rectum.  -Continue with foley catheter. Wife concerned about discontinuing Foley catheter. Understands infection risk -Manage constipation as below   History of CVA: No apparent focal neurodeficit but limited exam but limited exam.. -Resume aspirin for secondary prophylaxis.   Prolonged QT: QTc 538 but exaggerated by wide QRS/LBBB -Minimize and avoid QT prolonging drugs. -Optimize electrolytes   Hypokalemia: Resolved.   Lower back pain/stable L2 and L3 compression fracture deformities -Continue Tylenol as needed   Constipation: Stool ball noted on CT of 11/2. Per wife patient has had BM since discharge.  Seems to have resolved now. -Continue senna and MiraLAX daily  Physical deconditioning-patient is very deconditioned and dependent for most ADLs and transfer -PT/OT recommended SNF  Health maintenance -Received influenza vaccine on 11/9.  Allergic rhinitis with conjunctivitis-improved after Claritin and eyedrop. -Continue eyedrop and Claritin.   Moderate malnutrition Body mass index is 20.01 kg/m. Nutrition Problem: Moderate Malnutrition Etiology: chronic illness (Dementia) Signs/Symptoms: mild fat depletion, mild muscle depletion Interventions: Ensure Enlive (each supplement provides 350kcal and 20 grams of protein), MVI   DVT prophylaxis:  enoxaparin (LOVENOX) injection 40 mg Start: 08/22/21 1730  Code Status: DNR/DNI Family Communication: Updated patient's wife at bedside. Level of care: Telemetry Medical Status is: Inpatient  Remains inpatient appropriate because: Further evaluation for  fever and safe disposition/SNF   Consultants:  Infectious disease Palliative medicine   Sch Meds:  Scheduled Meds:  aspirin EC  81 mg Oral Daily   Chlorhexidine Gluconate Cloth  6 each Topical Daily   enoxaparin (LOVENOX) injection  40 mg Subcutaneous Q24H   feeding supplement  237 mL Oral BID BM   loratadine  10 mg Oral Daily   multivitamin  1 tablet Oral QHS   polyethylene glycol  17 g Oral Daily   pyridostigmine  60 mg Oral BID   rosuvastatin  5 mg Oral Daily   senna  1 tablet Oral Daily   sodium chloride flush  3 mL Intravenous Q12H   Suvorexant  15 mg Oral QHS   tamsulosin  0.4 mg Oral QPC supper   Continuous Infusions:   PRN Meds:.acetaminophen, bisacodyl, hydrALAZINE, melatonin, naphazoline-glycerin  Antimicrobials: Anti-infectives (From admission, onward)    Start     Dose/Rate Route Frequency Ordered Stop   08/23/21 1600  vancomycin (VANCOREADY) IVPB 1250 mg/250 mL  Status:  Discontinued        1,250 mg 166.7 mL/hr over 90 Minutes Intravenous Every 24 hours 08/22/21 1524 08/27/21 1305   08/22/21 1430  vancomycin (VANCOREADY) IVPB 1500 mg/300 mL        1,500 mg 150 mL/hr over 120 Minutes  Intravenous  Once 08/22/21 1425 08/22/21 1749   08/22/21 1430  ceFEPIme (MAXIPIME) 2 g in sodium chloride 0.9 % 100 mL IVPB  Status:  Discontinued        2 g 200 mL/hr over 30 Minutes Intravenous Every 12 hours 08/22/21 1425 08/23/21 1354   08/22/21 1400  cefTRIAXone (ROCEPHIN) 1 g in sodium chloride 0.9 % 100 mL IVPB  Status:  Discontinued        1 g 200 mL/hr over 30 Minutes Intravenous  Once 08/22/21 1359 08/22/21 1359        I have personally reviewed the following labs and images: CBC: Recent Labs  Lab 08/26/21 0155 08/27/21 0142 08/28/21 0117 08/29/21 0111 08/30/21 0222  WBC 10.3 10.0 11.7* 12.3* 10.1  NEUTROABS  --   --   --  9.6*  --   HGB 12.2* 12.6* 12.1* 12.1* 12.1*  HCT 35.9* 37.2* 35.4* 35.3* 35.7*  MCV 89.5 89.9 89.4 89.4 89.7  PLT 166 172 173 196  213   BMP &GFR Recent Labs  Lab 08/26/21 0155 08/27/21 0142 08/28/21 0117 08/29/21 0111 08/30/21 0222  NA 138 138 138 136 139  K 3.5 3.7 3.4* 3.9 3.7  CL 106 109 107 106 106  CO2 26 24 26 23 25   GLUCOSE 102* 114* 101* 87 94  BUN 19 16 14 15 15   CREATININE 1.08 0.98 0.87 1.00 1.00  CALCIUM 8.2* 8.5* 8.2* 8.5* 8.6*  MG 1.8 1.9 1.8 1.8 1.9  PHOS  --  2.6 2.6 2.5 3.6   Estimated Creatinine Clearance: 61.9 mL/min (by C-G formula based on SCr of 1 mg/dL). Liver & Pancreas: Recent Labs  Lab 08/27/21 0142 08/28/21 0117 08/29/21 0111 08/30/21 0222  ALBUMIN 2.4* 2.2* 2.3* 2.4*   No results for input(s): LIPASE, AMYLASE in the last 168 hours. Recent Labs  Lab 08/27/21 0142  AMMONIA 21   Diabetic: No results for input(s): HGBA1C in the last 72 hours. No results for input(s): GLUCAP in the last 168 hours. Cardiac Enzymes: Recent Labs  Lab 08/27/21 0142  CKTOTAL 32*   No results for input(s): PROBNP in the last 8760 hours. Coagulation Profile: No results for input(s): INR, PROTIME in the last 168 hours.  Thyroid Function Tests: No results for input(s): TSH, T4TOTAL, FREET4, T3FREE, THYROIDAB in the last 72 hours.  Lipid Profile: No results for input(s): CHOL, HDL, LDLCALC, TRIG, CHOLHDL, LDLDIRECT in the last 72 hours. Anemia Panel: No results for input(s): VITAMINB12, FOLATE, FERRITIN, TIBC, IRON, RETICCTPCT in the last 72 hours. Urine analysis:    Component Value Date/Time   COLORURINE AMBER (A) 08/22/2021 1358   APPEARANCEUR CLOUDY (A) 08/22/2021 1358   LABSPEC 1.025 08/22/2021 1358   PHURINE 5.0 08/22/2021 1358   GLUCOSEU NEGATIVE 08/22/2021 1358   HGBUR LARGE (A) 08/22/2021 1358   BILIRUBINUR NEGATIVE 08/22/2021 1358   KETONESUR 20 (A) 08/22/2021 1358   PROTEINUR 30 (A) 08/22/2021 1358   UROBILINOGEN 1 06/12/2014 1156   NITRITE NEGATIVE 08/22/2021 1358   LEUKOCYTESUR TRACE (A) 08/22/2021 1358   Sepsis Labs: Invalid input(s): PROCALCITONIN,  LACTICIDVEN  Microbiology: Recent Results (from the past 240 hour(s))  Resp Panel by RT-PCR (Flu A&B, Covid) Nasopharyngeal Swab     Status: None   Collection Time: 08/22/21  2:08 PM   Specimen: Nasopharyngeal Swab; Nasopharyngeal(NP) swabs in vial transport medium  Result Value Ref Range Status   SARS Coronavirus 2 by RT PCR NEGATIVE NEGATIVE Final    Comment: (NOTE) SARS-CoV-2 target nucleic acids  are NOT DETECTED.  The SARS-CoV-2 RNA is generally detectable in upper respiratory specimens during the acute phase of infection. The lowest concentration of SARS-CoV-2 viral copies this assay can detect is 138 copies/mL. A negative result does not preclude SARS-Cov-2 infection and should not be used as the sole basis for treatment or other patient management decisions. A negative result may occur with  improper specimen collection/handling, submission of specimen other than nasopharyngeal swab, presence of viral mutation(s) within the areas targeted by this assay, and inadequate number of viral copies(<138 copies/mL). A negative result must be combined with clinical observations, patient history, and epidemiological information. The expected result is Negative.  Fact Sheet for Patients:  BloggerCourse.com  Fact Sheet for Healthcare Providers:  SeriousBroker.it  This test is no t yet approved or cleared by the [place] FDA and  has been authorized for detection and/or diagnosis of SARS-CoV-2 by FDA under an Emergency Use Authorization (EUA). This EUA will remain  in effect (meaning this test can be used) for the duration of the COVID-19 declaration under Section 564(b)(1) of the Act, 21 U.S.C.section 360bbb-3(b)(1), unless the authorization is terminated  or revoked sooner.       Influenza A by PCR NEGATIVE NEGATIVE Final   Influenza B by PCR NEGATIVE NEGATIVE Final    Comment: (NOTE) The Xpert Xpress SARS-CoV-2/FLU/RSV plus  assay is intended as an aid in the diagnosis of influenza from Nasopharyngeal swab specimens and should not be used as a sole basis for treatment. Nasal washings and aspirates are unacceptable for Xpert Xpress SARS-CoV-2/FLU/RSV testing.  Fact Sheet for Patients: BloggerCourse.com  Fact Sheet for Healthcare Providers: SeriousBroker.it  This test is not yet approved or cleared by the [place] FDA and has been authorized for detection and/or diagnosis of SARS-CoV-2 by FDA under an Emergency Use Authorization (EUA). This EUA will remain in effect (meaning this test can be used) for the duration of the COVID-19 declaration under Section 564(b)(1) of the Act, 21 U.S.C. section 360bbb-3(b)(1), unless the authorization is terminated or revoked.  Performed at [facility], [address], [place], [place] [address]   Urine Culture     Status: None   Collection Time: 08/22/21  2:08 PM   Specimen: In/Out Cath Urine  Result Value Ref Range Status   Specimen Description IN/OUT CATH URINE  Final   Special Requests NONE  Final   Culture   Final    NO GROWTH Performed at [facility], [address] [place], [place] [address]    Report Status 08/23/2021 FINAL  Final  Blood Culture (routine x 2)     Status: Abnormal   Collection Time: 08/22/21  2:10 PM   Specimen: BLOOD LEFT ARM  Result Value Ref Range Status   Specimen Description BLOOD LEFT ARM  Final   Special Requests   Final    BOTTLES DRAWN AEROBIC AND ANAEROBIC Blood Culture results may not be optimal due to an excessive volume of blood received in culture bottles   Culture  Setup Time   Final    GRAM POSITIVE COCCI IN CLUSTERS IN BOTH AEROBIC AND ANAEROBIC BOTTLES CRITICAL RESULT CALLED TO, READ BACK BY AND VERIFIED WITH: E,BREWINGTON PHARMD @1115  08/23/21 EB    Culture (A)  Final    STAPHYLOCOCCUS EPIDERMIDIS STAPHYLOCOCCUS HOMINIS THE SIGNIFICANCE OF  ISOLATING THIS ORGANISM FROM A SINGLE SET OF BLOOD CULTURES WHEN MULTIPLE SETS ARE DRAWN IS UNCERTAIN. PLEASE NOTIFY THE MICROBIOLOGY DEPARTMENT WITHIN ONE WEEK IF SPECIATION  AND SENSITIVITIES ARE REQUIRED. Performed at [facility], [address], [place], [place] [address]    Report Status 08/26/2021 FINAL  Final   Organism ID, Bacteria STAPHYLOCOCCUS EPIDERMIDIS  Final      Susceptibility   Staphylococcus epidermidis - MIC*    CIPROFLOXACIN >=8 RESISTANT Resistant     ERYTHROMYCIN >=8 RESISTANT Resistant     GENTAMICIN <=0.5 SENSITIVE Sensitive     OXACILLIN >=4 RESISTANT Resistant     TETRACYCLINE >=16 RESISTANT Resistant     VANCOMYCIN 1 SENSITIVE Sensitive     TRIMETH/SULFA 160 RESISTANT Resistant     CLINDAMYCIN <=0.25 SENSITIVE Sensitive     RIFAMPIN <=0.5 SENSITIVE Sensitive     Inducible Clindamycin NEGATIVE Sensitive     * STAPHYLOCOCCUS EPIDERMIDIS  Blood Culture ID Panel (Reflexed)     Status: Abnormal   Collection Time: 08/22/21  2:10 PM  Result Value Ref Range Status   Enterococcus faecalis NOT DETECTED NOT DETECTED Final   Enterococcus Faecium NOT DETECTED NOT DETECTED Final   Listeria monocytogenes NOT DETECTED NOT DETECTED Final   Staphylococcus species DETECTED (A) NOT DETECTED Final    Comment: CRITICAL RESULT CALLED TO, READ BACK BY AND VERIFIED WITH: E,BREWINGTON PHARMD @1115  08/23/21 EB    Staphylococcus aureus (BCID) NOT DETECTED NOT DETECTED Final   Staphylococcus epidermidis DETECTED (A) NOT DETECTED Final    Comment: Methicillin (oxacillin) resistant coagulase negative staphylococcus. Possible blood culture contaminant (unless isolated from more than one blood culture draw or clinical case suggests pathogenicity). No antibiotic treatment is indicated for blood  culture contaminants. CRITICAL RESULT CALLED TO, READ BACK BY AND VERIFIED WITH: E,BREWINGTON PHARMD @1115  08/23/21 EB    Staphylococcus lugdunensis NOT DETECTED NOT DETECTED Final    Streptococcus species NOT DETECTED NOT DETECTED Final   Streptococcus agalactiae NOT DETECTED NOT DETECTED Final   Streptococcus pneumoniae NOT DETECTED NOT DETECTED Final   Streptococcus pyogenes NOT DETECTED NOT DETECTED Final   A.calcoaceticus-baumannii NOT DETECTED NOT DETECTED Final   Bacteroides fragilis NOT DETECTED NOT DETECTED Final   Enterobacterales NOT DETECTED NOT DETECTED Final   Enterobacter cloacae complex NOT DETECTED NOT DETECTED Final   Escherichia coli NOT DETECTED NOT DETECTED Final   Klebsiella aerogenes NOT DETECTED NOT DETECTED Final   Klebsiella oxytoca NOT DETECTED NOT DETECTED Final   Klebsiella pneumoniae NOT DETECTED NOT DETECTED Final   Proteus species NOT DETECTED NOT DETECTED Final   Salmonella species NOT DETECTED NOT DETECTED Final   Serratia marcescens NOT DETECTED NOT DETECTED Final   Haemophilus influenzae NOT DETECTED NOT DETECTED Final   Neisseria meningitidis NOT DETECTED NOT DETECTED Final   Pseudomonas aeruginosa NOT DETECTED NOT DETECTED Final   Stenotrophomonas maltophilia NOT DETECTED NOT DETECTED Final   Candida albicans NOT DETECTED NOT DETECTED Final   Candida auris NOT DETECTED NOT DETECTED Final   Candida glabrata NOT DETECTED NOT DETECTED Final   Candida krusei NOT DETECTED NOT DETECTED Final   Candida parapsilosis NOT DETECTED NOT DETECTED Final   Candida tropicalis NOT DETECTED NOT DETECTED Final   Cryptococcus neoformans/gattii NOT DETECTED NOT DETECTED Final   Methicillin resistance mecA/C DETECTED (A) NOT DETECTED Final    Comment: CRITICAL RESULT CALLED TO, READ BACK BY AND VERIFIED WITH: E,BREWINGTON PHARMD @1115  08/23/21 EB Performed at [facility], [address] [place], [place] [address]   Blood Culture (routine x 2)     Status: Abnormal   Collection Time: 08/22/21  2:20 PM   Specimen: BLOOD RIGHT ARM  Result Value Ref Range Status   Specimen Description BLOOD RIGHT ARM  Final   Special Requests   Final     BOTTLES DRAWN AEROBIC AND ANAEROBIC Blood Culture adequate volume   Culture  Setup Time   Final    GRAM POSITIVE COCCI IN CLUSTERS IN BOTH AEROBIC AND ANAEROBIC BOTTLES CRITICAL VALUE NOTED.  VALUE IS CONSISTENT WITH PREVIOUSLY REPORTED AND CALLED VALUE. Performed at [facility], [address] [place], [place] [address]    Culture STAPHYLOCOCCUS EPIDERMIDIS (A)  Final   Report Status 08/28/2021 FINAL  Final   Organism ID, Bacteria STAPHYLOCOCCUS EPIDERMIDIS  Final      Susceptibility   Staphylococcus epidermidis - MIC*    CIPROFLOXACIN >=8 RESISTANT Resistant     ERYTHROMYCIN >=8 RESISTANT Resistant     GENTAMICIN <=0.5 SENSITIVE Sensitive     OXACILLIN >=4 RESISTANT Resistant     TETRACYCLINE >=16 RESISTANT Resistant     VANCOMYCIN 1 SENSITIVE Sensitive     TRIMETH/SULFA 160 RESISTANT Resistant     CLINDAMYCIN <=0.25 SENSITIVE Sensitive     RIFAMPIN <=0.5 SENSITIVE Sensitive     Inducible Clindamycin NEGATIVE Sensitive     * STAPHYLOCOCCUS EPIDERMIDIS  Culture, blood (routine x 2)     Status: None   Collection Time: 08/25/21  7:17 AM   Specimen: BLOOD  Result Value Ref Range Status   Specimen Description BLOOD RIGHT ANTECUBITAL  Final   Special Requests   Final    BOTTLES DRAWN AEROBIC AND ANAEROBIC Blood Culture adequate volume   Culture   Final    NO GROWTH 5 DAYS Performed at [facility], [address], [place], [place] [address]    Report Status 08/30/2021 FINAL  Final  Culture, blood (routine x 2)     Status: None   Collection Time: 08/25/21  7:25 AM   Specimen: BLOOD  Result Value Ref Range Status   Specimen Description BLOOD RIGHT ANTECUBITAL  Final   Special Requests   Final    BOTTLES DRAWN AEROBIC AND ANAEROBIC Blood Culture adequate volume   Culture   Final    NO GROWTH 5 DAYS Performed at [facility], [address], [place], [place] [address]    Report Status 08/30/2021 FINAL  Final  Culture, blood (routine x 2)     Status: None  (Preliminary result)   Collection Time: 08/29/21 12:04 PM   Specimen: BLOOD  Result Value Ref Range Status   Specimen Description BLOOD SITE NOT SPECIFIED  Final   Special Requests   Final    BOTTLES DRAWN AEROBIC ONLY Blood Culture results may not be optimal due to an inadequate volume of blood received in culture bottles   Culture   Final    NO GROWTH < 24 HOURS Performed at [facility], [address], [place], [place] [address]    Report Status PENDING  Incomplete  Culture, blood (routine x 2)     Status: None (Preliminary result)   Collection Time: 08/29/21 12:04 PM   Specimen: BLOOD  Result Value Ref Range Status   Specimen Description BLOOD SITE NOT SPECIFIED  Final   Special Requests   Final    BOTTLES DRAWN AEROBIC ONLY Blood Culture results may not be optimal due to an inadequate volume of blood received in culture bottles   Culture   Final    NO GROWTH < 24 HOURS Performed at [facility], [address], [place],  [place] [address]    Report Status PENDING  Incomplete    Radiology Studies: No results found.    Sinaya Minogue T. Maciah Feeback [facility]  If 7PM-7AM, please contact night-coverage [email or web address] 08/30/2021, 1:36 PM

## 2021-08-31 ENCOUNTER — Inpatient Hospital Stay (HOSPITAL_COMMUNITY): Payer: Medicare HMO

## 2021-08-31 DIAGNOSIS — Z8673 Personal history of transient ischemic attack (TIA), and cerebral infarction without residual deficits: Secondary | ICD-10-CM

## 2021-08-31 DIAGNOSIS — I639 Cerebral infarction, unspecified: Secondary | ICD-10-CM | POA: Diagnosis not present

## 2021-08-31 DIAGNOSIS — N1831 Chronic kidney disease, stage 3a: Secondary | ICD-10-CM

## 2021-08-31 DIAGNOSIS — F0280 Dementia in other diseases classified elsewhere without behavioral disturbance: Secondary | ICD-10-CM

## 2021-08-31 DIAGNOSIS — F03C0 Unspecified dementia, severe, without behavioral disturbance, psychotic disturbance, mood disturbance, and anxiety: Secondary | ICD-10-CM | POA: Diagnosis not present

## 2021-08-31 DIAGNOSIS — G9341 Metabolic encephalopathy: Secondary | ICD-10-CM | POA: Diagnosis not present

## 2021-08-31 DIAGNOSIS — M545 Low back pain, unspecified: Secondary | ICD-10-CM

## 2021-08-31 DIAGNOSIS — R509 Fever, unspecified: Secondary | ICD-10-CM | POA: Diagnosis not present

## 2021-08-31 DIAGNOSIS — K5900 Constipation, unspecified: Secondary | ICD-10-CM

## 2021-08-31 DIAGNOSIS — R7881 Bacteremia: Secondary | ICD-10-CM | POA: Diagnosis not present

## 2021-08-31 DIAGNOSIS — B957 Other staphylococcus as the cause of diseases classified elsewhere: Secondary | ICD-10-CM | POA: Diagnosis not present

## 2021-08-31 DIAGNOSIS — H1010 Acute atopic conjunctivitis, unspecified eye: Secondary | ICD-10-CM

## 2021-08-31 DIAGNOSIS — R339 Retention of urine, unspecified: Secondary | ICD-10-CM

## 2021-08-31 DIAGNOSIS — I4581 Long QT syndrome: Secondary | ICD-10-CM

## 2021-08-31 LAB — ECHOCARDIOGRAM COMPLETE
AV Mean grad: 6 mmHg
AV Peak grad: 9.9 mmHg
Ao pk vel: 1.57 m/s
Area-P 1/2: 3.31 cm2
Height: 74 in
Weight: 2493.84 oz

## 2021-08-31 LAB — RESP PANEL BY RT-PCR (FLU A&B, COVID) ARPGX2
Influenza A by PCR: NEGATIVE
Influenza B by PCR: NEGATIVE
SARS Coronavirus 2 by RT PCR: NEGATIVE

## 2021-08-31 MED ORDER — PERFLUTREN LIPID MICROSPHERE
1.0000 mL | INTRAVENOUS | Status: AC | PRN
Start: 2021-08-31 — End: 2021-08-31
  Administered 2021-08-31: 2 mL via INTRAVENOUS
  Filled 2021-08-31: qty 10

## 2021-08-31 NOTE — Consult Note (Signed)
[facility] CM Inpatient Consult   08/31/2021  Deauntae Bernheisel 1946/09/01 [id number]  [facility] [[facility]]  Accountable Care Organization [ACO] Patient:  Primary Care Provider:  Lucky Cowboy, Marta Lamas, patient has been reach out by upstream scheduler for CCM services noted in chart encounters review  Patient screened for less than 7 days readmission hospitalization with noted. .  Review of patient's medical record reveals patient is for home and CCM team to follow with TOC follow up.  Sign off.  For questions contact:   Charlesetta Shanks, RN BSN CCM [facility]  [phone] business mobile phone Toll free office [phone]  Fax number: [phone] [email or web address]

## 2021-08-31 NOTE — Progress Notes (Signed)
PROGRESS NOTE  Mathew Miller. JYN:[id number] DOB: [birth year]   PCP: Mathew Cowboy, MD  Patient is from: Home.  Lives with his wife.  Wheelchair and rolling walker at baseline.  Dependent for most ADLs  DOA: 08/22/2021 LOS: 8  Chief complaints:  Chief Complaint  Patient presents with   Code Sepsis     Brief Narrative / Interim history: 75 year old M with PMH of advanced dementia, CKD-3A, HTN, HLD, dysautonomia with postural hypotension, CVA, urinary retention with chronic Foley and recent hospitalization from 11/1-11/3 for SIRS returning with fever to 105 at home.  He had mild leukocytosis with fever to 101.9 in ED.  Cultures obtained.  Started on broad-spectrum antibiotics. Blood cultures with staph epidermis in 2 out of 2 bottles and staph hominis in 1 out of 2 bottles.  Antibiotics de-escalated to IV vancomycin.  ID consulted.  TTE did not show vegetation but poor quality.  Repeat blood culture NGTD.  Had fever to 101.9 on admission but none since then.  No leukocytosis.  ID discontinued vancomycin on 11/10, and recommended observing off antibiotics.   Patient spiked fever to 100.4 the morning of 11/12.  ID recommended repeat blood culture and monitoring of antibiotics unless fever recurs or blood culture positive.  Patient has not had further fever.  Blood cultures NGTD.  Leukocytosis improved.  ID recommends repeat TTE since the initial TTE was poor quality.  Therapy recommends SNF.  Medically stable to transfer to SNF after repeat TTE.  Subjective: Seen and examined earlier this morning.  No major events overnight of this morning.  Patient's wife at bedside.  Concerned about a small blister on his foreskin and he is right leg.  No other concerns or issues.  Objective: Vitals:   08/30/21 2101 08/31/21 0537 08/31/21 0559 08/31/21 0752  BP: 95/60  101/62   Pulse: 73 70 79 63  Resp: 16 20  20   Temp: 98.6 F (37 C) 98.7 F (37.1 C)  98.7 F (37.1 C)  TempSrc: Oral Axillary   Axillary  SpO2: 100% 97%    Weight:      Height:        Intake/Output Summary (Last 24 hours) at 08/31/2021 1206 Last data filed at 08/31/2021 0601 Gross per 24 hour  Intake --  Output 300 ml  Net -300 ml   Filed Weights   08/24/21 1134  Weight: 70.7 kg    Examination:  GENERAL: No apparent distress.  Nontoxic. HEENT: MMM.  Vision and hearing grossly intact.  NECK: Supple.  No apparent JVD.  RESP: 97% on RA.  No IWOB.  Fair aeration bilaterally. CVS:  RRR. Heart sounds normal.  ABD/GI/GU: BS+. Abd soft, NTND.  Small blister/swelling over lateral aspect of his foreskin MSK/EXT:  Moves extremities.  Seems to have some contractures.  Significant muscle mass and subcu fat loss. SKIN: Small blister with small surrounding erythema on RLE NEURO: Awake.  Oriented to self.  No apparent focal neuro deficit but limited exam due to severe dementia. PSYCH: Calm. Normal affect.   Procedures:  None  Microbiology summarized: 11/5-COVID-19 and influenza PCR nonreactive. 11/5- blood cultures with staph epidermis in 2 out of 2 bottles and staph hominis in 1 out of 2 bottles 11/8-blood cultures NGTD. 11/12-blood cultures NGTD.  Assessment & Plan: Fever: reportedly febrile to 105 at home.  He was febrile to 101.9 in ED and to 100.4 last night.  Leukocytosis rising after initial improvement.  CXR and CT chest negative.  UA with WBC  and RBC.  Urine culture NGTD.  LE [place] negative for DVT.  TTE and ESR negative.  Blood culture from 11/5 felt to be contaminant versus true bacteremia.  ID discontinued IV vancomycin and recommends observation off antibiotics on 11/10.  Spiked mild fever to 100.5 on 11/12.  ID recommended repeat blood culture and watching off antibiotics unless he continues to spike fever or blood cultures positive.  Leukocytosis improving.   -ID recommends repeat TTE since initial TTE was not good quality. -Continue monitoring  Acute metabolic encephalopathy in patient with advanced  dementia:  Repeat CT head without acute finding.  Seems to be waxing and waning per patient's wife suggesting delirium.  -Treat treatable causes. -Reorientation and delirium precautions. -Palliative medicine consulted and following. -Plan for palliative follow-up at SNF   CKD-3A: Renal function better than his baseline. Recent Labs    08/19/21 0300 08/22/21 1358 08/23/21 0721 08/24/21 0844 08/25/21 0718 08/26/21 0155 08/27/21 0142 08/28/21 0117 08/29/21 0111 08/30/21 0222  BUN 31* 24* 23 20 13 19 16 14 15 15   CREATININE 1.47* 1.45* 1.28* 1.00 1.00 1.08 0.98 0.87 1.00 1.00  -Continue monitoring   Urinary retention: Unclear etiology.  Recent CT renal stone study showed multiple vascular clips at prostate and massive stool ball in rectum.  -Continue with foley catheter. Wife concerned about discontinuing Foley catheter. Understands infection risk -Manage constipation as below   History of CVA: No apparent focal neurodeficit but limited exam but limited exam.. -Resume aspirin for secondary prophylaxis.   Prolonged QT: QTc 538 but exaggerated by wide QRS/LBBB -Minimize and avoid QT prolonging drugs. -Optimize electrolytes   Hypokalemia: Resolved.   Lower back pain/stable L2 and L3 compression fracture deformities -Continue Tylenol as needed   Constipation: Stool ball noted on CT of 11/2. Per wife patient has had BM since discharge.  Seems to have resolved now. -Continue senna and MiraLAX daily  Physical deconditioning-patient is very deconditioned and dependent for most ADLs and transfer -PT/OT recommended SNF  Health maintenance -Received influenza vaccine on 11/9.  Allergic rhinitis with conjunctivitis-improved after Claritin and eyedrop. -Continue eyedrop and Claritin.   Moderate malnutrition: As evidenced by significant muscle mass and subcu fat loss Body mass index is 20.01 kg/m. Nutrition Problem: Moderate Malnutrition Etiology: chronic illness  (Dementia) Signs/Symptoms: mild fat depletion, mild muscle depletion Interventions: Ensure Enlive (each supplement provides 350kcal and 20 grams of protein), MVI   DVT prophylaxis:  enoxaparin (LOVENOX) injection 40 mg Start: 08/22/21 1730  Code Status: DNR/DNI Family Communication: Updated patient's wife at bedside. Level of care: Telemetry Medical Status is: Inpatient  Remains inpatient appropriate because: Echocardiogram to exclude endocarditis and safe disposition/SNF   Consultants:  Infectious disease Palliative medicine   Sch Meds:  Scheduled Meds:  aspirin EC  81 mg Oral Daily   Chlorhexidine Gluconate Cloth  6 each Topical Daily   enoxaparin (LOVENOX) injection  40 mg Subcutaneous Q24H   feeding supplement  237 mL Oral BID BM   loratadine  10 mg Oral Daily   multivitamin  1 tablet Oral QHS   polyethylene glycol  17 g Oral Daily   pyridostigmine  60 mg Oral BID   rosuvastatin  5 mg Oral Daily   senna  1 tablet Oral Daily   sodium chloride flush  3 mL Intravenous Q12H   Suvorexant  15 mg Oral QHS   tamsulosin  0.4 mg Oral QPC supper   Continuous Infusions:   PRN Meds:.acetaminophen, bisacodyl, hydrALAZINE, melatonin, naphazoline-glycerin  Antimicrobials: Anti-infectives (From  admission, onward)    Start     Dose/Rate Route Frequency Ordered Stop   08/23/21 1600  vancomycin (VANCOREADY) IVPB 1250 mg/250 mL  Status:  Discontinued        1,250 mg 166.7 mL/hr over 90 Minutes Intravenous Every 24 hours 08/22/21 1524 08/27/21 1305   08/22/21 1430  vancomycin (VANCOREADY) IVPB 1500 mg/300 mL        1,500 mg 150 mL/hr over 120 Minutes Intravenous  Once 08/22/21 1425 08/22/21 1749   08/22/21 1430  ceFEPIme (MAXIPIME) 2 g in sodium chloride 0.9 % 100 mL IVPB  Status:  Discontinued        2 g 200 mL/hr over 30 Minutes Intravenous Every 12 hours 08/22/21 1425 08/23/21 1354   08/22/21 1400  cefTRIAXone (ROCEPHIN) 1 g in sodium chloride 0.9 % 100 mL IVPB  Status:   Discontinued        1 g 200 mL/hr over 30 Minutes Intravenous  Once 08/22/21 1359 08/22/21 1359        I have personally reviewed the following labs and images: CBC: Recent Labs  Lab 08/26/21 0155 08/27/21 0142 08/28/21 0117 08/29/21 0111 08/30/21 0222  WBC 10.3 10.0 11.7* 12.3* 10.1  NEUTROABS  --   --   --  9.6*  --   HGB 12.2* 12.6* 12.1* 12.1* 12.1*  HCT 35.9* 37.2* 35.4* 35.3* 35.7*  MCV 89.5 89.9 89.4 89.4 89.7  PLT 166 172 173 196 213   BMP &GFR Recent Labs  Lab 08/26/21 0155 08/27/21 0142 08/28/21 0117 08/29/21 0111 08/30/21 0222  NA 138 138 138 136 139  K 3.5 3.7 3.4* 3.9 3.7  CL 106 109 107 106 106  CO2 26 24 26 23 25   GLUCOSE 102* 114* 101* 87 94  BUN 19 16 14 15 15   CREATININE 1.08 0.98 0.87 1.00 1.00  CALCIUM 8.2* 8.5* 8.2* 8.5* 8.6*  MG 1.8 1.9 1.8 1.8 1.9  PHOS  --  2.6 2.6 2.5 3.6   Estimated Creatinine Clearance: 61.9 mL/min (by C-G formula based on SCr of 1 mg/dL). Liver & Pancreas: Recent Labs  Lab 08/27/21 0142 08/28/21 0117 08/29/21 0111 08/30/21 0222  ALBUMIN 2.4* 2.2* 2.3* 2.4*   No results for input(s): LIPASE, AMYLASE in the last 168 hours. Recent Labs  Lab 08/27/21 0142  AMMONIA 21   Diabetic: No results for input(s): HGBA1C in the last 72 hours. No results for input(s): GLUCAP in the last 168 hours. Cardiac Enzymes: Recent Labs  Lab 08/27/21 0142  CKTOTAL 32*   No results for input(s): PROBNP in the last 8760 hours. Coagulation Profile: No results for input(s): INR, PROTIME in the last 168 hours.  Thyroid Function Tests: No results for input(s): TSH, T4TOTAL, FREET4, T3FREE, THYROIDAB in the last 72 hours.  Lipid Profile: No results for input(s): CHOL, HDL, LDLCALC, TRIG, CHOLHDL, LDLDIRECT in the last 72 hours. Anemia Panel: No results for input(s): VITAMINB12, FOLATE, FERRITIN, TIBC, IRON, RETICCTPCT in the last 72 hours. Urine analysis:    Component Value Date/Time   COLORURINE AMBER (A) 08/22/2021 1358    APPEARANCEUR CLOUDY (A) 08/22/2021 1358   LABSPEC 1.025 08/22/2021 1358   PHURINE 5.0 08/22/2021 1358   GLUCOSEU NEGATIVE 08/22/2021 1358   HGBUR LARGE (A) 08/22/2021 1358   BILIRUBINUR NEGATIVE 08/22/2021 1358   KETONESUR 20 (A) 08/22/2021 1358   PROTEINUR 30 (A) 08/22/2021 1358   UROBILINOGEN 1 06/12/2014 1156   NITRITE NEGATIVE 08/22/2021 1358   LEUKOCYTESUR TRACE (A) 08/22/2021 1358  Sepsis Labs: Invalid input(s): PROCALCITONIN, LACTICIDVEN  Microbiology: Recent Results (from the past 240 hour(s))  Resp Panel by RT-PCR (Flu A&B, Covid) Nasopharyngeal Swab     Status: None   Collection Time: 08/22/21  2:08 PM   Specimen: Nasopharyngeal Swab; Nasopharyngeal(NP) swabs in vial transport medium  Result Value Ref Range Status   SARS Coronavirus 2 by RT PCR NEGATIVE NEGATIVE Final    Comment: (NOTE) SARS-CoV-2 target nucleic acids are NOT DETECTED.  The SARS-CoV-2 RNA is generally detectable in upper respiratory specimens during the acute phase of infection. The lowest concentration of SARS-CoV-2 viral copies this assay can detect is 138 copies/mL. A negative result does not preclude SARS-Cov-2 infection and should not be used as the sole basis for treatment or other patient management decisions. A negative result may occur with  improper specimen collection/handling, submission of specimen other than nasopharyngeal swab, presence of viral mutation(s) within the areas targeted by this assay, and inadequate number of viral copies(<138 copies/mL). A negative result must be combined with clinical observations, patient history, and epidemiological information. The expected result is Negative.  Fact Sheet for Patients:  BloggerCourse.com  Fact Sheet for Healthcare Providers:  SeriousBroker.it  This test is no t yet approved or cleared by the [place] FDA and  has been authorized for detection and/or diagnosis of SARS-CoV-2  by FDA under an Emergency Use Authorization (EUA). This EUA will remain  in effect (meaning this test can be used) for the duration of the COVID-19 declaration under Section 564(b)(1) of the Act, 21 U.S.C.section 360bbb-3(b)(1), unless the authorization is terminated  or revoked sooner.       Influenza A by PCR NEGATIVE NEGATIVE Final   Influenza B by PCR NEGATIVE NEGATIVE Final    Comment: (NOTE) The Xpert Xpress SARS-CoV-2/FLU/RSV plus assay is intended as an aid in the diagnosis of influenza from Nasopharyngeal swab specimens and should not be used as a sole basis for treatment. Nasal washings and aspirates are unacceptable for Xpert Xpress SARS-CoV-2/FLU/RSV testing.  Fact Sheet for Patients: BloggerCourse.com  Fact Sheet for Healthcare Providers: SeriousBroker.it  This test is not yet approved or cleared by the [place] FDA and has been authorized for detection and/or diagnosis of SARS-CoV-2 by FDA under an Emergency Use Authorization (EUA). This EUA will remain in effect (meaning this test can be used) for the duration of the COVID-19 declaration under Section 564(b)(1) of the Act, 21 U.S.C. section 360bbb-3(b)(1), unless the authorization is terminated or revoked.  Performed at [facility], [address] [place], [place] [address]   Urine Culture     Status: None   Collection Time: 08/22/21  2:08 PM   Specimen: In/Out Cath Urine  Result Value Ref Range Status   Specimen Description IN/OUT CATH URINE  Final   Special Requests NONE  Final   Culture   Final    NO GROWTH Performed at [facility], [address] [place], [place] [address]    Report Status 08/23/2021 FINAL  Final  Blood Culture (routine x 2)     Status: Abnormal   Collection Time: 08/22/21  2:10 PM   Specimen: BLOOD LEFT ARM  Result Value Ref Range Status   Specimen Description BLOOD LEFT ARM  Final   Special Requests   Final     BOTTLES DRAWN AEROBIC AND ANAEROBIC Blood Culture results may not be optimal due to an excessive volume of blood received in culture bottles   Culture  Setup Time  Final    GRAM POSITIVE COCCI IN CLUSTERS IN BOTH AEROBIC AND ANAEROBIC BOTTLES CRITICAL RESULT CALLED TO, READ BACK BY AND VERIFIED WITH: E,BREWINGTON PHARMD @1115  08/23/21 EB    Culture (A)  Final    STAPHYLOCOCCUS EPIDERMIDIS STAPHYLOCOCCUS HOMINIS THE SIGNIFICANCE OF ISOLATING THIS ORGANISM FROM A SINGLE SET OF BLOOD CULTURES WHEN MULTIPLE SETS ARE DRAWN IS UNCERTAIN. PLEASE NOTIFY THE MICROBIOLOGY DEPARTMENT WITHIN ONE WEEK IF SPECIATION AND SENSITIVITIES ARE REQUIRED. Performed at [facility], [address] [place], [place] [address]    Report Status 08/26/2021 FINAL  Final   Organism ID, Bacteria STAPHYLOCOCCUS EPIDERMIDIS  Final      Susceptibility   Staphylococcus epidermidis - MIC*    CIPROFLOXACIN >=8 RESISTANT Resistant     ERYTHROMYCIN >=8 RESISTANT Resistant     GENTAMICIN <=0.5 SENSITIVE Sensitive     OXACILLIN >=4 RESISTANT Resistant     TETRACYCLINE >=16 RESISTANT Resistant     VANCOMYCIN 1 SENSITIVE Sensitive     TRIMETH/SULFA 160 RESISTANT Resistant     CLINDAMYCIN <=0.25 SENSITIVE Sensitive     RIFAMPIN <=0.5 SENSITIVE Sensitive     Inducible Clindamycin NEGATIVE Sensitive     * STAPHYLOCOCCUS EPIDERMIDIS  Blood Culture ID Panel (Reflexed)     Status: Abnormal   Collection Time: 08/22/21  2:10 PM  Result Value Ref Range Status   Enterococcus faecalis NOT DETECTED NOT DETECTED Final   Enterococcus Faecium NOT DETECTED NOT DETECTED Final   Listeria monocytogenes NOT DETECTED NOT DETECTED Final   Staphylococcus species DETECTED (A) NOT DETECTED Final    Comment: CRITICAL RESULT CALLED TO, READ BACK BY AND VERIFIED WITH: E,BREWINGTON PHARMD @1115  08/23/21 EB    Staphylococcus aureus (BCID) NOT DETECTED NOT DETECTED Final   Staphylococcus epidermidis DETECTED (A) NOT DETECTED Final    Comment:  Methicillin (oxacillin) resistant coagulase negative staphylococcus. Possible blood culture contaminant (unless isolated from more than one blood culture draw or clinical case suggests pathogenicity). No antibiotic treatment is indicated for blood  culture contaminants. CRITICAL RESULT CALLED TO, READ BACK BY AND VERIFIED WITH: E,BREWINGTON PHARMD @1115  08/23/21 EB    Staphylococcus lugdunensis NOT DETECTED NOT DETECTED Final   Streptococcus species NOT DETECTED NOT DETECTED Final   Streptococcus agalactiae NOT DETECTED NOT DETECTED Final   Streptococcus pneumoniae NOT DETECTED NOT DETECTED Final   Streptococcus pyogenes NOT DETECTED NOT DETECTED Final   A.calcoaceticus-baumannii NOT DETECTED NOT DETECTED Final   Bacteroides fragilis NOT DETECTED NOT DETECTED Final   Enterobacterales NOT DETECTED NOT DETECTED Final   Enterobacter cloacae complex NOT DETECTED NOT DETECTED Final   Escherichia coli NOT DETECTED NOT DETECTED Final   Klebsiella aerogenes NOT DETECTED NOT DETECTED Final   Klebsiella oxytoca NOT DETECTED NOT DETECTED Final   Klebsiella pneumoniae NOT DETECTED NOT DETECTED Final   Proteus species NOT DETECTED NOT DETECTED Final   Salmonella species NOT DETECTED NOT DETECTED Final   Serratia marcescens NOT DETECTED NOT DETECTED Final   Haemophilus influenzae NOT DETECTED NOT DETECTED Final   Neisseria meningitidis NOT DETECTED NOT DETECTED Final   Pseudomonas aeruginosa NOT DETECTED NOT DETECTED Final   Stenotrophomonas maltophilia NOT DETECTED NOT DETECTED Final   Candida albicans NOT DETECTED NOT DETECTED Final   Candida auris NOT DETECTED NOT DETECTED Final   Candida glabrata NOT DETECTED NOT DETECTED Final   Candida krusei NOT DETECTED NOT DETECTED Final   Candida parapsilosis NOT DETECTED NOT DETECTED Final   Candida tropicalis NOT DETECTED NOT DETECTED Final   Cryptococcus neoformans/gattii  NOT DETECTED NOT DETECTED Final   Methicillin resistance mecA/C DETECTED (A) NOT  DETECTED Final    Comment: CRITICAL RESULT CALLED TO, READ BACK BY AND VERIFIED WITH: E,BREWINGTON PHARMD @1115  08/23/21 EB Performed at [facility], [address] [place], [place] [address]   Blood Culture (routine x 2)     Status: Abnormal   Collection Time: 08/22/21  2:20 PM   Specimen: BLOOD RIGHT ARM  Result Value Ref Range Status   Specimen Description BLOOD RIGHT ARM  Final   Special Requests   Final    BOTTLES DRAWN AEROBIC AND ANAEROBIC Blood Culture adequate volume   Culture  Setup Time   Final    GRAM POSITIVE COCCI IN CLUSTERS IN BOTH AEROBIC AND ANAEROBIC BOTTLES CRITICAL VALUE NOTED.  VALUE IS CONSISTENT WITH PREVIOUSLY REPORTED AND CALLED VALUE. Performed at [facility], [address], [place], [place] [address]    Culture STAPHYLOCOCCUS EPIDERMIDIS (A)  Final   Report Status 08/28/2021 FINAL  Final   Organism ID, Bacteria STAPHYLOCOCCUS EPIDERMIDIS  Final      Susceptibility   Staphylococcus epidermidis - MIC*    CIPROFLOXACIN >=8 RESISTANT Resistant     ERYTHROMYCIN >=8 RESISTANT Resistant     GENTAMICIN <=0.5 SENSITIVE Sensitive     OXACILLIN >=4 RESISTANT Resistant     TETRACYCLINE >=16 RESISTANT Resistant     VANCOMYCIN 1 SENSITIVE Sensitive     TRIMETH/SULFA 160 RESISTANT Resistant     CLINDAMYCIN <=0.25 SENSITIVE Sensitive     RIFAMPIN <=0.5 SENSITIVE Sensitive     Inducible Clindamycin NEGATIVE Sensitive     * STAPHYLOCOCCUS EPIDERMIDIS  Culture, blood (routine x 2)     Status: None   Collection Time: 08/25/21  7:17 AM   Specimen: BLOOD  Result Value Ref Range Status   Specimen Description BLOOD RIGHT ANTECUBITAL  Final   Special Requests   Final    BOTTLES DRAWN AEROBIC AND ANAEROBIC Blood Culture adequate volume   Culture   Final    NO GROWTH 5 DAYS Performed at [facility], [address], [place], [place] [address]    Report Status 08/30/2021 FINAL  Final  Culture, blood (routine x 2)     Status: None   Collection Time:  08/25/21  7:25 AM   Specimen: BLOOD  Result Value Ref Range Status   Specimen Description BLOOD RIGHT ANTECUBITAL  Final   Special Requests   Final    BOTTLES DRAWN AEROBIC AND ANAEROBIC Blood Culture adequate volume   Culture   Final    NO GROWTH 5 DAYS Performed at [facility], [address], [place], [place] [address]    Report Status 08/30/2021 FINAL  Final  Culture, blood (routine x 2)     Status: None (Preliminary result)   Collection Time: 08/29/21 12:04 PM   Specimen: BLOOD  Result Value Ref Range Status   Specimen Description BLOOD SITE NOT SPECIFIED  Final   Special Requests   Final    BOTTLES DRAWN AEROBIC ONLY Blood Culture results may not be optimal due to an inadequate volume of blood received in culture bottles   Culture   Final    NO GROWTH 2 DAYS Performed at [facility], [address] [place], [place] [address]    Report Status PENDING  Incomplete  Culture, blood (routine x 2)     Status: None (Preliminary result)   Collection Time: 08/29/21 12:04 PM   Specimen:  BLOOD  Result Value Ref Range Status   Specimen Description BLOOD SITE NOT SPECIFIED  Final   Special Requests   Final    BOTTLES DRAWN AEROBIC ONLY Blood Culture results may not be optimal due to an inadequate volume of blood received in culture bottles   Culture   Final    NO GROWTH 2 DAYS Performed at [facility], [address], [place], [place] [address]    Report Status PENDING  Incomplete    Radiology Studies: No results found.    Winfred Redel T. Jossue Rubenstein [facility]  If 7PM-7AM, please contact night-coverage [email or web address] 08/31/2021, 12:06 PM

## 2021-08-31 NOTE — Progress Notes (Addendum)
[facility] Infectious Diseases Follow Up Note  Patient Identification: Patient Name: Mathew Miller. MRN: [id number] Admit Date: 08/22/2021  1:51 PM Age: 75 y.o.Today's Date: 08/31/2021  Reason for Visit: MRSE bacteremia/fevers   Principal Problem:   Fever Active Problems:   Hyperlipidemia   Goals of care, counseling/discussion   CVA (cerebral vascular accident) ([place])   Dementia ([place])   Prolonged QT interval   Acute metabolic encephalopathy   Staphylococcus epidermidis bacteremia   Malnutrition of moderate degree   Altered mental status   Antibiotics: Vancomycin 11/5 - 11/9                    Cefepime 11/5 -11/6   Lines/Hardwares: None   Interval Events: One episode of fever 100.4 on 11/11 after Vancomycin stopped with WBC 12. Afebrile since then, Leukocytosis has resolved. Blood cx 11/12 no growth to date  Assessment MRSE bacteremia ( Both sets) with staph hominis ( only one set) -Staph hominis is most likely a contaminant. MRSE could be a contaminant as well but present in both sets ( similar resistance patterns) and initial concerns for this attributing to his fever on presentation. Of note, he was recently admitted 11/1-11/3 for worsening mental status but unremarkable work up. hence, concerns for phlebitis. No known hardware. TTE unremarkable ( limited exam) -Repeat blood cx 11/8 no growth jn 5 days and 11/12 no growth in 2 days  -He is confused but otherwise stable per wife   Fever- UA with greater than 50 RBCs, 21-50 WBCs. Urine cx no growth.  He also had a recent CT renal study which was unremarkable except massive stool ball in the rectum. Cxray/CT head and Chest unremarkable. No reported diarrhea. Venous duplex of LE negative for DVT. Initial concern for MRSE bacteremia causing fevers but repeat blood cultures have been no growth *2. TTE ( limited ) no concerns for vegetation. No rashes, eosinophilia or AKI. Liver  enzymes 11/5 unremarkable.   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 Thank you for the consult. Please page with pertinent questions or concerns.  ______________________________________________________________________ Subjective patient seen and examined at the bedside. He is confused. Spoke with wife at bedside who has no concerns.   Vitals BP 101/62 (BP Location: Right Arm)   Pulse 63   Temp 98.7 F (37.1 C) (Axillary)   Resp 20   Ht 6\' 2"  (1.88 m)   Wt 70.7 kg Comment: bed scale  SpO2 97%   BMI 20.01 kg/m      Physical Exam Constitutional:  sitting up in bed, awake    Comments:   Cardiovascular:     Rate and Rhythm: Normal rate and regular rhythm.     Heart sounds:   Pulmonary:     Effort: Pulmonary effort is normal.     Comments:   Abdominal:     Palpations: Abdomen is soft.     Tenderness: Non tender and non distended   Musculoskeletal:        General: No swelling or tenderness.   Skin:    Comments: no obvious lesions or rashes   Neurological:  General: not interactive and repeats certain words constantly   Psychiatric:        Mood and Affect: confused   Pertinent Microbiology Results for orders placed or performed during the hospital encounter of 08/22/21  Resp Panel by RT-PCR (Flu A&B, Covid) Nasopharyngeal Swab     Status: None   Collection Time: 08/22/21  2:08 PM   Specimen: Nasopharyngeal Swab; Nasopharyngeal(NP) swabs in vial transport medium  Result Value Ref Range Status   SARS Coronavirus 2 by RT PCR NEGATIVE NEGATIVE Final    Comment: (NOTE) SARS-CoV-2 target nucleic acids are NOT  DETECTED.  The SARS-CoV-2 RNA is generally detectable in upper respiratory specimens during the acute phase of infection. The lowest concentration of SARS-CoV-2 viral copies this assay can detect is 138 copies/mL. A negative result does not preclude SARS-Cov-2 infection and should not be used as the sole basis for treatment or other patient management decisions. A negative result may occur with  improper specimen collection/handling, submission of specimen other than nasopharyngeal swab, presence of viral mutation(s) within the areas targeted by this assay, and inadequate number of viral copies(<138 copies/mL). A negative result must be combined with clinical observations, patient history, and epidemiological information. The expected result is Negative.  Fact Sheet for Patients:  EntrepreneurPulse.com.au  Fact Sheet for Healthcare Providers:  IncredibleEmployment.be  This test is no t yet approved or cleared by the [place] FDA and  has been authorized for detection and/or diagnosis of SARS-CoV-2 by FDA under an Emergency Use Authorization (EUA). This EUA will remain  in effect (meaning this test can be used) for the duration of the COVID-19 declaration under Section 564(b)(1) of the Act, 21 U.S.C.section 360bbb-3(b)(1), unless the authorization is terminated  or revoked sooner.       Influenza A by PCR NEGATIVE NEGATIVE Final   Influenza B by PCR NEGATIVE NEGATIVE Final    Comment: (NOTE) The Xpert Xpress SARS-CoV-2/FLU/RSV plus assay is intended as an aid in the diagnosis of influenza from Nasopharyngeal swab specimens and should not be used as a sole basis for treatment. Nasal washings and aspirates are unacceptable for Xpert Xpress SARS-CoV-2/FLU/RSV testing.  Fact Sheet for Patients: EntrepreneurPulse.com.au  Fact Sheet for Healthcare Providers: IncredibleEmployment.be  This test is not yet  approved or cleared by the [place] FDA and has been authorized for detection and/or diagnosis of SARS-CoV-2 by FDA under an Emergency Use Authorization (EUA). This EUA will remain in effect (meaning this test can be used) for the duration of the COVID-19 declaration under Section 564(b)(1) of the Act, 21 U.S.C. section 360bbb-3(b)(1), unless the authorization is terminated or revoked.  Performed at [facility], [place] [address], [place], [place] [address]   Urine Culture     Status: None   Collection Time: 08/22/21  2:08 PM   Specimen: In/Out Cath Urine  Result Value Ref Range Status   Specimen Description IN/OUT CATH URINE  Final   Special Requests NONE  Final   Culture   Final    NO GROWTH Performed at [facility], [place] [address] [place], [place] [address]    Report Status 08/23/2021 FINAL  Final  Blood Culture (routine x 2)     Status: Abnormal   Collection Time: 08/22/21  2:10 PM   Specimen: BLOOD LEFT ARM  Result Value Ref Range Status   Specimen Description BLOOD LEFT ARM  Final   Special Requests   Final    BOTTLES DRAWN AEROBIC AND ANAEROBIC Blood  Culture results may not be optimal due to an excessive volume of blood received in culture bottles   Culture  Setup Time   Final    GRAM POSITIVE COCCI IN CLUSTERS IN BOTH AEROBIC AND ANAEROBIC BOTTLES CRITICAL RESULT CALLED TO, READ BACK BY AND VERIFIED WITH: E,BREWINGTON PHARMD @1115  08/23/21 EB    Culture (A)  Final    STAPHYLOCOCCUS EPIDERMIDIS STAPHYLOCOCCUS HOMINIS THE SIGNIFICANCE OF ISOLATING THIS ORGANISM FROM A SINGLE SET OF BLOOD CULTURES WHEN MULTIPLE SETS ARE DRAWN IS UNCERTAIN. PLEASE NOTIFY THE MICROBIOLOGY DEPARTMENT WITHIN ONE WEEK IF SPECIATION AND SENSITIVITIES ARE REQUIRED. Performed at [facility], [place] [address], [place], [place] [address]    Report Status 08/26/2021 FINAL  Final   Organism ID, Bacteria STAPHYLOCOCCUS EPIDERMIDIS  Final      Susceptibility   Staphylococcus  epidermidis - MIC*    CIPROFLOXACIN >=8 RESISTANT Resistant     ERYTHROMYCIN >=8 RESISTANT Resistant     GENTAMICIN <=0.5 SENSITIVE Sensitive     OXACILLIN >=4 RESISTANT Resistant     TETRACYCLINE >=16 RESISTANT Resistant     VANCOMYCIN 1 SENSITIVE Sensitive     TRIMETH/SULFA 160 RESISTANT Resistant     CLINDAMYCIN <=0.25 SENSITIVE Sensitive     RIFAMPIN <=0.5 SENSITIVE Sensitive     Inducible Clindamycin NEGATIVE Sensitive     * STAPHYLOCOCCUS EPIDERMIDIS  Blood Culture ID Panel (Reflexed)     Status: Abnormal   Collection Time: 08/22/21  2:10 PM  Result Value Ref Range Status   Enterococcus faecalis NOT DETECTED NOT DETECTED Final   Enterococcus Faecium NOT DETECTED NOT DETECTED Final   Listeria monocytogenes NOT DETECTED NOT DETECTED Final   Staphylococcus species DETECTED (A) NOT DETECTED Final    Comment: CRITICAL RESULT CALLED TO, READ BACK BY AND VERIFIED WITH: E,BREWINGTON PHARMD @1115  08/23/21 EB    Staphylococcus aureus (BCID) NOT DETECTED NOT DETECTED Final   Staphylococcus epidermidis DETECTED (A) NOT DETECTED Final    Comment: Methicillin (oxacillin) resistant coagulase negative staphylococcus. Possible blood culture contaminant (unless isolated from more than one blood culture draw or clinical case suggests pathogenicity). No antibiotic treatment is indicated for blood  culture contaminants. CRITICAL RESULT CALLED TO, READ BACK BY AND VERIFIED WITH: E,BREWINGTON PHARMD @1115  08/23/21 EB    Staphylococcus lugdunensis NOT DETECTED NOT DETECTED Final   Streptococcus species NOT DETECTED NOT DETECTED Final   Streptococcus agalactiae NOT DETECTED NOT DETECTED Final   Streptococcus pneumoniae NOT DETECTED NOT DETECTED Final   Streptococcus pyogenes NOT DETECTED NOT DETECTED Final   A.calcoaceticus-baumannii NOT DETECTED NOT DETECTED Final   Bacteroides fragilis NOT DETECTED NOT DETECTED Final   Enterobacterales NOT DETECTED NOT DETECTED Final   Enterobacter cloacae complex  NOT DETECTED NOT DETECTED Final   Escherichia coli NOT DETECTED NOT DETECTED Final   Klebsiella aerogenes NOT DETECTED NOT DETECTED Final   Klebsiella oxytoca NOT DETECTED NOT DETECTED Final   Klebsiella pneumoniae NOT DETECTED NOT DETECTED Final   Proteus species NOT DETECTED NOT DETECTED Final   Salmonella species NOT DETECTED NOT DETECTED Final   Serratia marcescens NOT DETECTED NOT DETECTED Final   Haemophilus influenzae NOT DETECTED NOT DETECTED Final   Neisseria meningitidis NOT DETECTED NOT DETECTED Final   Pseudomonas aeruginosa NOT DETECTED NOT DETECTED Final   Stenotrophomonas maltophilia NOT DETECTED NOT DETECTED Final   Candida albicans NOT DETECTED NOT DETECTED Final   Candida auris NOT DETECTED NOT DETECTED Final   Candida glabrata NOT DETECTED NOT DETECTED Final   Candida krusei NOT  DETECTED NOT DETECTED Final   Candida parapsilosis NOT DETECTED NOT DETECTED Final   Candida tropicalis NOT DETECTED NOT DETECTED Final   Cryptococcus neoformans/gattii NOT DETECTED NOT DETECTED Final   Methicillin resistance mecA/C DETECTED (A) NOT DETECTED Final    Comment: CRITICAL RESULT CALLED TO, READ BACK BY AND VERIFIED WITH: E,BREWINGTON PHARMD @1115  08/23/21 EB Performed at [facility], [place] [address], [place], [place] [address]   Blood Culture (routine x 2)     Status: Abnormal   Collection Time: 08/22/21  2:20 PM   Specimen: BLOOD RIGHT ARM  Result Value Ref Range Status   Specimen Description BLOOD RIGHT ARM  Final   Special Requests   Final    BOTTLES DRAWN AEROBIC AND ANAEROBIC Blood Culture adequate volume   Culture  Setup Time   Final    GRAM POSITIVE COCCI IN CLUSTERS IN BOTH AEROBIC AND ANAEROBIC BOTTLES CRITICAL VALUE NOTED.  VALUE IS CONSISTENT WITH PREVIOUSLY REPORTED AND CALLED VALUE. Performed at [facility], [place] [address], [place], [place] [address]    Culture STAPHYLOCOCCUS EPIDERMIDIS (A)  Final   Report Status 08/28/2021 FINAL  Final    Organism ID, Bacteria STAPHYLOCOCCUS EPIDERMIDIS  Final      Susceptibility   Staphylococcus epidermidis - MIC*    CIPROFLOXACIN >=8 RESISTANT Resistant     ERYTHROMYCIN >=8 RESISTANT Resistant     GENTAMICIN <=0.5 SENSITIVE Sensitive     OXACILLIN >=4 RESISTANT Resistant     TETRACYCLINE >=16 RESISTANT Resistant     VANCOMYCIN 1 SENSITIVE Sensitive     TRIMETH/SULFA 160 RESISTANT Resistant     CLINDAMYCIN <=0.25 SENSITIVE Sensitive     RIFAMPIN <=0.5 SENSITIVE Sensitive     Inducible Clindamycin NEGATIVE Sensitive     * STAPHYLOCOCCUS EPIDERMIDIS  Culture, blood (routine x 2)     Status: None   Collection Time: 08/25/21  7:17 AM   Specimen: BLOOD  Result Value Ref Range Status   Specimen Description BLOOD RIGHT ANTECUBITAL  Final   Special Requests   Final    BOTTLES DRAWN AEROBIC AND ANAEROBIC Blood Culture adequate volume   Culture   Final    NO GROWTH 5 DAYS Performed at [facility], [address], [place], [place] [address]    Report Status 08/30/2021 FINAL  Final  Culture, blood (routine x 2)     Status: None   Collection Time: 08/25/21  7:25 AM   Specimen: BLOOD  Result Value Ref Range Status   Specimen Description BLOOD RIGHT ANTECUBITAL  Final   Special Requests   Final    BOTTLES DRAWN AEROBIC AND ANAEROBIC Blood Culture adequate volume   Culture   Final    NO GROWTH 5 DAYS Performed at [facility], [place] [address], [place], [place] [address]    Report Status 08/30/2021 FINAL  Final  Culture, blood (routine x 2)     Status: None (Preliminary result)   Collection Time: 08/29/21 12:04 PM   Specimen: BLOOD  Result Value Ref Range Status   Specimen Description BLOOD SITE NOT SPECIFIED  Final   Special Requests   Final    BOTTLES DRAWN AEROBIC ONLY Blood Culture results may not be optimal due to an inadequate volume of blood received in culture bottles   Culture   Final    NO GROWTH < 24 HOURS Performed at [facility], [place] [address]  [place], [place] [address]    Report Status  PENDING  Incomplete  Culture, blood (routine x 2)     Status: None (Preliminary result)   Collection Time: 08/29/21 12:04 PM   Specimen: BLOOD  Result Value Ref Range Status   Specimen Description BLOOD SITE NOT SPECIFIED  Final   Special Requests   Final    BOTTLES DRAWN AEROBIC ONLY Blood Culture results may not be optimal due to an inadequate volume of blood received in culture bottles   Culture   Final    NO GROWTH < 24 HOURS Performed at [facility], [address], [place], [place] [address]    Report Status PENDING  Incomplete    Pertinent Lab. CBC Latest Ref Rng & Units 08/30/2021 08/29/2021 08/28/2021  WBC 4.0 - 10.5 K/uL 10.1 12.3(H) 11.7(H)  Hemoglobin 13.0 - 17.0 g/dL 12.1(L) 12.1(L) 12.1(L)  Hematocrit 39.0 - 52.0 % 35.7(L) 35.3(L) 35.4(L)  Platelets 150 - 400 K/uL 213 196 173   CMP Latest Ref Rng & Units 08/30/2021 08/29/2021 08/28/2021  Glucose 70 - 99 mg/dL 94 87 101(H)  BUN 8 - 23 mg/dL 15 15 14   Creatinine 0.61 - 1.24 mg/dL 1.00 1.00 0.87  Sodium 135 - 145 mmol/L 139 136 138  Potassium 3.5 - 5.1 mmol/L 3.7 3.9 3.4(L)  Chloride 98 - 111 mmol/L 106 106 107  CO2 22 - 32 mmol/L 25 23 26   Calcium 8.9 - 10.3 mg/dL 8.6(L) 8.5(L) 8.2(L)  Total Protein 6.5 - 8.1 g/dL - - -  Total Bilirubin 0.3 - 1.2 mg/dL - - -  Alkaline Phos 38 - 126 U/L - - -  AST 15 - 41 U/L - - -  ALT 0 - 44 U/L - - -     Pertinent Imaging today Plain films and CT images have been personally visualized and interpreted; radiology reports have been reviewed. Decision making incorporated into the Impression / Recommendations.  I spent more than 40 minutes for this patient encounter including review of prior medical records, coordination of care  with greater than 50% of time being face to face/counseling and discussing diagnostics/treatment plan with the patient/family.  Electronically signed by:   Rosiland Oz, MD Infectious Disease  Physician [facility] for Infectious Disease Pager: [phone]

## 2021-08-31 NOTE — TOC Progression Note (Signed)
Transition of Care (TOC) - Progression Note    Patient Details  Name: Mathew Miller. MRN: [id number] Date of Birth: May 24, 1946  [facility]) CM/SW Contact  Emeterio Reeve, [place] Phone Number: 08/31/2021, 4:04 PM  Clinical Narrative:     CSW has reached out to [facility] multiple times about insurance auth. Per MD pt should be ready to DC tomorrow. SW left VM and text for ashton to return call. CSW requested covid test.   Expected Discharge Plan: [place] Barriers to Discharge: Continued Medical Work up, Ship broker, SNF Pending bed offer  Expected Discharge Plan and Services Expected Discharge Plan: [place] In-house Referral: Clinical Social Work   Post Acute Care Choice: [place] Living arrangements for the past 2 months: Single Family Home Expected Discharge Date: 08/31/21                                     Social Determinants of Health (SDOH) Interventions    Readmission Risk Interventions No flowsheet data found.  Emeterio Reeve, LCSW Clinical Social Worker

## 2021-09-01 DIAGNOSIS — I639 Cerebral infarction, unspecified: Secondary | ICD-10-CM

## 2021-09-01 DIAGNOSIS — G9341 Metabolic encephalopathy: Secondary | ICD-10-CM | POA: Diagnosis not present

## 2021-09-01 DIAGNOSIS — E440 Moderate protein-calorie malnutrition: Secondary | ICD-10-CM

## 2021-09-01 DIAGNOSIS — Z0001 Encounter for general adult medical examination with abnormal findings: Secondary | ICD-10-CM

## 2021-09-01 DIAGNOSIS — F03C0 Unspecified dementia, severe, without behavioral disturbance, psychotic disturbance, mood disturbance, and anxiety: Secondary | ICD-10-CM | POA: Diagnosis not present

## 2021-09-01 DIAGNOSIS — R509 Fever, unspecified: Secondary | ICD-10-CM | POA: Diagnosis not present

## 2021-09-01 MED ORDER — BISACODYL 10 MG RE SUPP
10.0000 mg | Freq: Every day | RECTAL | 0 refills | Status: AC | PRN
Start: 2021-09-01 — End: ?

## 2021-09-01 MED ORDER — ENSURE ENLIVE PO LIQD: 237.0000 mL | Freq: Two times a day (BID) | ORAL | 12 refills | Status: AC

## 2021-09-01 MED ORDER — ACETAMINOPHEN 325 MG PO TABS: 650.0000 mg | ORAL_TABLET | Freq: Four times a day (QID) | ORAL | Status: AC | PRN

## 2021-09-01 MED ORDER — RENA-VITE PO TABS: 1.0000 | ORAL_TABLET | Freq: Every day | ORAL | 0 refills | Status: AC

## 2021-09-01 MED ORDER — MELATONIN 5 MG PO TABS: 5.0000 mg | ORAL_TABLET | Freq: Every evening | ORAL | 0 refills | Status: AC | PRN

## 2021-09-01 NOTE — TOC Transition Note (Signed)
Transition of Care [facility]) - CM/SW Discharge Note   Patient Details  Name: Mathew Miller. MRN: [id number] Date of Birth: [birth year]  Transition of Care [facility]) CM/SW Contact:  Emeterio Reeve, LCSW Phone Number: 09/01/2021, 10:44 AM   Clinical Narrative:     Per MD patient ready for DC to . RN, patient, patient's family, and facility notified of DC. Discharge Summary and FL2 sent to facility. DC packet on chart. Insurance Josem Kaufmann has been received and pt is covid negative. Ambulance transport requested for patient.    RN to call report to [phone]  CSW will sign off for now as social work intervention is no longer needed. Please consult [place] again if new needs arise.   Final next level of care: Skilled Nursing Facility Barriers to Discharge: Barriers Resolved   Patient Goals and CMS Choice Patient states their goals for this hospitalization and ongoing recovery are:: Pt disoriented and unable to participate in goal setting. CMS Medicare.gov Compare Post Acute Care list provided to:: Patient Represenative (must comment) (Spouse) Choice offered to / list presented to : Spouse  Discharge Placement              Patient [facility] bed at: [facility] Patient to be transferred to facility by: [facility] Name of family member notified: wife Patient and family notified of of transfer: 09/01/21  Discharge Plan and Services In-house Referral: Clinical Social Work   Post Acute Care Choice: [place]                               Social Determinants of Health ([place]) Interventions     Readmission Risk Interventions No flowsheet data found.   Emeterio Reeve, LCSW Clinical Social Worker

## 2021-09-01 NOTE — Progress Notes (Addendum)
ID Brief Note   Remains afebrile, WBC 10.1 Blood cx 11/12 no growth in 2 days and 11/8 no growth in 5 days  Repeat TTE unremarkable for vegetations or endocarditis   No indication of antibiotics currently  Patient is planned to go to SNF   ID will sign off. Please call with questions   Rosiland Oz, MD Infectious Disease Physician [facility] for Infectious Disease [address] [place], [place] [address] Phone: [phone]  Fax: [phone]

## 2021-09-01 NOTE — Plan of Care (Signed)

## 2021-09-01 NOTE — Progress Notes (Signed)
Occupational Therapy Treatment Patient Details Name: Mathew Miller. MRN: [id number] DOB: 1946-05-24 Today's Date: 09/01/2021   History of present illness Mathew Sadd. is a 75 y.o. male who presents with fever on 11/6 and found to have sepsis.  Previously admitted from 11/1-3 with the same, started on antibiotics, but no source was found and he was discharged with indwelling foley. PMH includes HTN; stage 3a CKD; advanced dementia; dysautonomia with postural hypotension; h/o CVA; and urinary retention with foley catheter placement, CVA.   OT comments  Pt with limited progress towards OT goals, however did exhibit slightly more volitional and directed movement during functional activity this session. Still requiring heavy Max A with hand-over-hand for grooming at EOB, and Total A for bed mobility. Noticed fisting behavior in R hand and bruising on dorsal and palmar aspects. Pt expressing pain with passive extension. Plans to d/c to SNF today and may benefit from resting hand splint while there. Will continue to follow acutely.   Recommendations for follow up therapy are one component of a multi-disciplinary discharge planning process, led by the attending physician.  Recommendations may be updated based on patient status, additional functional criteria and insurance authorization.    Follow Up Recommendations  Skilled nursing-short term rehab (<3 hours/day)    Assistance Recommended at Discharge Frequent or constant Supervision/Assistance  Equipment Recommendations  None recommended by OT    Recommendations for Other Services      Precautions / Restrictions Precautions Precautions: Fall Precaution Comments: Watch for signs of presyncope/syncope; history of orthostatic hypotension Restrictions Weight Bearing Restrictions: No       Mobility Bed Mobility Overal bed mobility: Needs Assistance Bed Mobility: Supine to Sit;Sit to Supine;Rolling Rolling: Total assist;+2 for  physical assistance   Supine to sit: Total assist;+2 for physical assistance Sit to supine: Total assist;+2 for physical assistance   General bed mobility comments: Pt verbalizing in pain while mobilizing to EOB, not initiating movement during transfer.    Transfers                   General transfer comment: Deferred     Balance Overall balance assessment: Needs assistance Sitting-balance support: Feet supported;No upper extremity supported Sitting balance-Leahy Scale: Poor Sitting balance - Comments: Requiring Mod - Max A to maintain balance at EOB                                   ADL either performed or assessed with clinical judgement   ADL Overall ADL's : Needs assistance/impaired     Grooming: Wash/dry hands;Maximal assistance;Sitting Grooming Details (indicate cue type and reason): Heavy Max A to wash face with hand-over-hand at EOB. Pt did provide slightly more volitional movement this session.                                    Extremity/Trunk Assessment Upper Extremity Assessment Upper Extremity Assessment: Difficult to assess due to impaired cognition RUE Deficits / Details: Noticed fisting behavior in R hand and bruising on dorsal and palmar aspects. Pt reporting pain with passive extension. Painful with shoulder flexion >90*.  Limited volitional movement despite verbal and physical cues.            Vision       Perception     Praxis      Cognition Arousal/Alertness:  Lethargic;Awake/alert Behavior During Therapy: Flat affect Overall Cognitive Status: History of cognitive impairments - at baseline                                 General Comments: Continues to demonstrate difficulty communicating. Verbalizations difficult to comprehend at times or unrelated to topic. Remains with decreased initiation and ability to follow commands.          Exercises     Shoulder Instructions       General  Comments      Pertinent Vitals/ Pain       Pain Assessment: Faces Faces Pain Scale: Hurts even more Pain Location: back, R hand, L arm, neck Pain Descriptors / Indicators: Grimacing;Guarding;Moaning Pain Intervention(s): Limited activity within patient's tolerance;Monitored during session;Repositioned  Home Living                                          Prior Functioning/Environment              Frequency  Min 2X/week        Progress Toward Goals  OT Goals(current goals can now be found in the care plan section)  Progress towards OT goals: Progressing toward goals  Acute Rehab OT Goals Patient Stated Goal: unable to state OT Goal Formulation: Patient unable to participate in goal setting Time For Goal Achievement: 09/06/21 Potential to Achieve Goals: Fair ADL Goals Pt Will Perform Grooming: with mod assist;sitting Pt Will Perform Upper Body Bathing: with mod assist;sitting Pt Will Perform Upper Body Dressing: with mod assist;sitting Pt Will Transfer to Toilet: with mod assist;stand pivot transfer;bedside commode Pt Will Perform Toileting - Clothing Manipulation and hygiene: with max assist;sit to/from stand  Plan Discharge plan remains appropriate;Frequency remains appropriate    Co-evaluation    PT/OT/SLP Co-Evaluation/Treatment: Yes Reason for Co-Treatment: Complexity of the patient's impairments (multi-system involvement);For patient/therapist safety;To address functional/ADL transfers;Necessary to address cognition/behavior during functional activity   OT goals addressed during session: ADL's and self-care      AM-PAC OT "6 Clicks" Daily Activity     Outcome Measure   Help from another person eating meals?: A Lot Help from another person taking care of personal grooming?: A Lot Help from another person toileting, which includes using toliet, bedpan, or urinal?: Total Help from another person bathing (including washing, rinsing,  drying)?: Total Help from another person to put on and taking off regular upper body clothing?: Total Help from another person to put on and taking off regular lower body clothing?: Total 6 Click Score: 8    End of Session    OT Visit Diagnosis: History of falling (Z91.81);Muscle weakness (generalized) (M62.81);Adult, failure to thrive (R62.7);Other symptoms and signs involving cognitive function   Activity Tolerance Patient limited by pain   Patient Left in bed;with call bell/phone within reach   Nurse Communication Mobility status        Time: 1037-1101 OT Time Calculation (min): 24 min  Charges: OT General Charges $OT Visit: 1 Visit OT Treatments $Self Care/Home Management : 8-22 mins  Bianey Tesoro C, OT/L  Acute Rehab [phone]  Lenice Llamas 09/01/2021, 11:18 AM

## 2021-09-01 NOTE — Discharge Summary (Signed)
or focal lesion. Sinuses/Orbits: No acute finding. Other: None. IMPRESSION: 1. No acute intracranial abnormality. 2. Marked brain parenchymal atrophy and chronic microvascular disease. Electronically Signed   By: Fidela Salisbury M.D.   On: 08/22/2021 16:36   CT HEAD WO CONTRAST (5MM)  Result Date: 08/19/2021 CLINICAL DATA:  Facial trauma EXAM: CT HEAD WITHOUT CONTRAST TECHNIQUE: Contiguous axial images were obtained from the base of the skull through the vertex without intravenous contrast. COMPARISON:  12/01/2020 FINDINGS: Brain: No evidence of acute infarction, hemorrhage, hydrocephalus, extra-axial collection or mass lesion/mass effect. Global cortical atrophy. Subcortical white matter and periventricular small vessel ischemic changes. Vascular: No hyperdense vessel or unexpected calcification. Skull: Normal. Negative for fracture or focal lesion. Sinuses/Orbits: The visualized paranasal sinuses are essentially clear. The mastoid air cells are unopacified. Other: None. IMPRESSION: No  evidence of acute intracranial abnormality. Atrophy with small vessel ischemic changes. Electronically Signed   By: Julian Hy M.D.   On: 08/19/2021 01:33   CT CHEST WO CONTRAST  Result Date: 08/23/2021 CLINICAL DATA:  Fever of unknown origin. EXAM: CT CHEST WITHOUT CONTRAST TECHNIQUE: Multidetector CT imaging of the chest was performed following the standard protocol without IV contrast. COMPARISON:  None. FINDINGS: Cardiovascular: No acute findings. Aortic and coronary atherosclerotic calcification noted. Mediastinum/Nodes: No masses or pathologically enlarged lymph nodes identified on this unenhanced exam. Lungs/Pleura: No suspicious nodules or masses identified. No evidence of infiltrate or pleural effusion. Upper Abdomen:  Unremarkable. Musculoskeletal:  No suspicious bone lesions. IMPRESSION: No active disease within the thorax. Aortic Atherosclerosis (ICD10-I70.0). Electronically Signed   By: Marlaine Hind M.D.   On: 08/23/2021 12:42   DG Chest Port 1 View  Result Date: 08/22/2021 CLINICAL DATA:  Questionable sepsis. EXAM: PORTABLE CHEST 1 VIEW COMPARISON:  August 18, 2021. FINDINGS: Patient is rotated. Aortic atherosclerosis. Mediastinal contours or stable with mildly tortuous thoracic aorta. Normal size heart. Chronic senescent lung changes. Streaky bibasilar opacities are similar to prior. No visible pleural effusion or pneumothorax. The visualized skeletal structures are unchanged including chronic left lateral fifth rib fracture. IMPRESSION: 1. No acute cardiopulmonary finding. 2.  Aortic Atherosclerosis (ICD10-I70.0). Electronically Signed   By: Dahlia Bailiff M.D.   On: 08/22/2021 15:25   ECHOCARDIOGRAM COMPLETE  Result Date: 08/31/2021    ECHOCARDIOGRAM REPORT   Patient Name:   Mathew Miller. Date of Exam: 08/31/2021 Medical Rec #:  [id number]           Height:       74.0 in Accession #:    [id number]          Weight:       155.9 lb Date of Birth:  1946/06/04           BSA:           1.955 m Patient Age:    75 years            BP:           95/60 mmHg Patient Gender: M                   HR:           68 bpm. Exam Location:  Inpatient Procedure: 2D Echo, Cardiac Doppler, Color Doppler and Intracardiac            Opacification Agent Indications:    Fever  History:        Patient has prior history of Echocardiogram examinations, most  or focal lesion. Sinuses/Orbits: No acute finding. Other: None. IMPRESSION: 1. No acute intracranial abnormality. 2. Marked brain parenchymal atrophy and chronic microvascular disease. Electronically Signed   By: Fidela Salisbury M.D.   On: 08/22/2021 16:36   CT HEAD WO CONTRAST (5MM)  Result Date: 08/19/2021 CLINICAL DATA:  Facial trauma EXAM: CT HEAD WITHOUT CONTRAST TECHNIQUE: Contiguous axial images were obtained from the base of the skull through the vertex without intravenous contrast. COMPARISON:  12/01/2020 FINDINGS: Brain: No evidence of acute infarction, hemorrhage, hydrocephalus, extra-axial collection or mass lesion/mass effect. Global cortical atrophy. Subcortical white matter and periventricular small vessel ischemic changes. Vascular: No hyperdense vessel or unexpected calcification. Skull: Normal. Negative for fracture or focal lesion. Sinuses/Orbits: The visualized paranasal sinuses are essentially clear. The mastoid air cells are unopacified. Other: None. IMPRESSION: No  evidence of acute intracranial abnormality. Atrophy with small vessel ischemic changes. Electronically Signed   By: Julian Hy M.D.   On: 08/19/2021 01:33   CT CHEST WO CONTRAST  Result Date: 08/23/2021 CLINICAL DATA:  Fever of unknown origin. EXAM: CT CHEST WITHOUT CONTRAST TECHNIQUE: Multidetector CT imaging of the chest was performed following the standard protocol without IV contrast. COMPARISON:  None. FINDINGS: Cardiovascular: No acute findings. Aortic and coronary atherosclerotic calcification noted. Mediastinum/Nodes: No masses or pathologically enlarged lymph nodes identified on this unenhanced exam. Lungs/Pleura: No suspicious nodules or masses identified. No evidence of infiltrate or pleural effusion. Upper Abdomen:  Unremarkable. Musculoskeletal:  No suspicious bone lesions. IMPRESSION: No active disease within the thorax. Aortic Atherosclerosis (ICD10-I70.0). Electronically Signed   By: Marlaine Hind M.D.   On: 08/23/2021 12:42   DG Chest Port 1 View  Result Date: 08/22/2021 CLINICAL DATA:  Questionable sepsis. EXAM: PORTABLE CHEST 1 VIEW COMPARISON:  August 18, 2021. FINDINGS: Patient is rotated. Aortic atherosclerosis. Mediastinal contours or stable with mildly tortuous thoracic aorta. Normal size heart. Chronic senescent lung changes. Streaky bibasilar opacities are similar to prior. No visible pleural effusion or pneumothorax. The visualized skeletal structures are unchanged including chronic left lateral fifth rib fracture. IMPRESSION: 1. No acute cardiopulmonary finding. 2.  Aortic Atherosclerosis (ICD10-I70.0). Electronically Signed   By: Dahlia Bailiff M.D.   On: 08/22/2021 15:25   ECHOCARDIOGRAM COMPLETE  Result Date: 08/31/2021    ECHOCARDIOGRAM REPORT   Patient Name:   Mathew Miller. Date of Exam: 08/31/2021 Medical Rec #:  [id number]           Height:       74.0 in Accession #:    [id number]          Weight:       155.9 lb Date of Birth:  1946/06/04           BSA:           1.955 m Patient Age:    75 years            BP:           95/60 mmHg Patient Gender: M                   HR:           68 bpm. Exam Location:  Inpatient Procedure: 2D Echo, Cardiac Doppler, Color Doppler and Intracardiac            Opacification Agent Indications:    Fever  History:        Patient has prior history of Echocardiogram examinations, most  157.00 cm/s  PV Vmax:       1.07 m/s AV Vmean:     111.000 cm/s PV Peak grad:  4.6 mmHg AV VTI:       0.331 m AV Peak Grad: 9.9 mmHg AV Mean Grad: 6.0 mmHg  AORTA Ao Root diam: 3.50 cm MITRAL VALVE MV Area (PHT): 3.31 cm    SHUNTS MV Decel Time: 229 msec    Systemic Diam: 1.70 cm MV E velocity: 68.10 cm/s MV A velocity: 93.40 cm/s MV E/A  ratio:  0.73 Kardie Tobb DO Electronically signed by Berniece Salines DO Signature Date/Time: 08/31/2021/2:17:09 PM    Final    ECHOCARDIOGRAM COMPLETE  Result Date: 08/23/2021    ECHOCARDIOGRAM REPORT   Patient Name:   Mathew Miller. Date of Exam: 08/23/2021 Medical Rec #:  [id number]           Height:       74.0 in Accession #:    [id number]          Weight:       160.0 lb Date of Birth:  [birth year]           BSA:          1.977 m Patient Age:    29 years            BP:           170/82 mmHg Patient Gender: M                   HR:           60 bpm. Exam Location:  Inpatient Procedure: 2D Echo Indications:    bacteremia  History:        Patient has prior history of Echocardiogram examinations, most                 recent 07/16/2020. Signs/Symptoms:Fever; Risk                 Factors:Dyslipidemia.  Sonographer:    Johny Chess RDCS Referring Phys: [phone] [facility] [place]  Sonographer Comments: Image acquisition challenging due to uncooperative patient. IMPRESSIONS  1. Left ventricular ejection fraction, by estimation, is 60 to 65%. The left ventricle has normal function. The left ventricle has no regional wall motion abnormalities. Left ventricular diastolic parameters are consistent with Grade I diastolic dysfunction (impaired relaxation).  2. Right ventricular systolic function is normal. The right ventricular size is normal. Tricuspid regurgitation signal is inadequate for assessing PA pressure.  3. The mitral valve is normal in structure. Trivial mitral valve regurgitation. No evidence of mitral stenosis.  4. The aortic valve has an indeterminant number of cusps. Aortic valve regurgitation is not visualized. No aortic stenosis is present. FINDINGS  Left Ventricle: Left ventricular ejection fraction, by estimation, is 60 to 65%. The left ventricle has normal function. The left ventricle has no regional wall motion abnormalities. The left ventricular internal cavity size was normal in size. There is  no left  ventricular hypertrophy. Left ventricular diastolic parameters are consistent with Grade I diastolic dysfunction (impaired relaxation). Normal left ventricular filling pressure. Right Ventricle: The right ventricular size is normal. Right vetricular wall thickness was not well visualized. Right ventricular systolic function is normal. Tricuspid regurgitation signal is inadequate for assessing PA pressure. Left Atrium: Left atrial size was normal in size. Right Atrium: Right atrial size was not well visualized. Pericardium: The pericardium was not well visualized. Mitral Valve: The mitral valve is normal  157.00 cm/s  PV Vmax:       1.07 m/s AV Vmean:     111.000 cm/s PV Peak grad:  4.6 mmHg AV VTI:       0.331 m AV Peak Grad: 9.9 mmHg AV Mean Grad: 6.0 mmHg  AORTA Ao Root diam: 3.50 cm MITRAL VALVE MV Area (PHT): 3.31 cm    SHUNTS MV Decel Time: 229 msec    Systemic Diam: 1.70 cm MV E velocity: 68.10 cm/s MV A velocity: 93.40 cm/s MV E/A  ratio:  0.73 Kardie Tobb DO Electronically signed by Berniece Salines DO Signature Date/Time: 08/31/2021/2:17:09 PM    Final    ECHOCARDIOGRAM COMPLETE  Result Date: 08/23/2021    ECHOCARDIOGRAM REPORT   Patient Name:   Mathew Miller. Date of Exam: 08/23/2021 Medical Rec #:  [id number]           Height:       74.0 in Accession #:    [id number]          Weight:       160.0 lb Date of Birth:  [birth year]           BSA:          1.977 m Patient Age:    29 years            BP:           170/82 mmHg Patient Gender: M                   HR:           60 bpm. Exam Location:  Inpatient Procedure: 2D Echo Indications:    bacteremia  History:        Patient has prior history of Echocardiogram examinations, most                 recent 07/16/2020. Signs/Symptoms:Fever; Risk                 Factors:Dyslipidemia.  Sonographer:    Johny Chess RDCS Referring Phys: [phone] [facility] [place]  Sonographer Comments: Image acquisition challenging due to uncooperative patient. IMPRESSIONS  1. Left ventricular ejection fraction, by estimation, is 60 to 65%. The left ventricle has normal function. The left ventricle has no regional wall motion abnormalities. Left ventricular diastolic parameters are consistent with Grade I diastolic dysfunction (impaired relaxation).  2. Right ventricular systolic function is normal. The right ventricular size is normal. Tricuspid regurgitation signal is inadequate for assessing PA pressure.  3. The mitral valve is normal in structure. Trivial mitral valve regurgitation. No evidence of mitral stenosis.  4. The aortic valve has an indeterminant number of cusps. Aortic valve regurgitation is not visualized. No aortic stenosis is present. FINDINGS  Left Ventricle: Left ventricular ejection fraction, by estimation, is 60 to 65%. The left ventricle has normal function. The left ventricle has no regional wall motion abnormalities. The left ventricular internal cavity size was normal in size. There is  no left  ventricular hypertrophy. Left ventricular diastolic parameters are consistent with Grade I diastolic dysfunction (impaired relaxation). Normal left ventricular filling pressure. Right Ventricle: The right ventricular size is normal. Right vetricular wall thickness was not well visualized. Right ventricular systolic function is normal. Tricuspid regurgitation signal is inadequate for assessing PA pressure. Left Atrium: Left atrial size was normal in size. Right Atrium: Right atrial size was not well visualized. Pericardium: The pericardium was not well visualized. Mitral Valve: The mitral valve is normal  or focal lesion. Sinuses/Orbits: No acute finding. Other: None. IMPRESSION: 1. No acute intracranial abnormality. 2. Marked brain parenchymal atrophy and chronic microvascular disease. Electronically Signed   By: Fidela Salisbury M.D.   On: 08/22/2021 16:36   CT HEAD WO CONTRAST (5MM)  Result Date: 08/19/2021 CLINICAL DATA:  Facial trauma EXAM: CT HEAD WITHOUT CONTRAST TECHNIQUE: Contiguous axial images were obtained from the base of the skull through the vertex without intravenous contrast. COMPARISON:  12/01/2020 FINDINGS: Brain: No evidence of acute infarction, hemorrhage, hydrocephalus, extra-axial collection or mass lesion/mass effect. Global cortical atrophy. Subcortical white matter and periventricular small vessel ischemic changes. Vascular: No hyperdense vessel or unexpected calcification. Skull: Normal. Negative for fracture or focal lesion. Sinuses/Orbits: The visualized paranasal sinuses are essentially clear. The mastoid air cells are unopacified. Other: None. IMPRESSION: No  evidence of acute intracranial abnormality. Atrophy with small vessel ischemic changes. Electronically Signed   By: Julian Hy M.D.   On: 08/19/2021 01:33   CT CHEST WO CONTRAST  Result Date: 08/23/2021 CLINICAL DATA:  Fever of unknown origin. EXAM: CT CHEST WITHOUT CONTRAST TECHNIQUE: Multidetector CT imaging of the chest was performed following the standard protocol without IV contrast. COMPARISON:  None. FINDINGS: Cardiovascular: No acute findings. Aortic and coronary atherosclerotic calcification noted. Mediastinum/Nodes: No masses or pathologically enlarged lymph nodes identified on this unenhanced exam. Lungs/Pleura: No suspicious nodules or masses identified. No evidence of infiltrate or pleural effusion. Upper Abdomen:  Unremarkable. Musculoskeletal:  No suspicious bone lesions. IMPRESSION: No active disease within the thorax. Aortic Atherosclerosis (ICD10-I70.0). Electronically Signed   By: Marlaine Hind M.D.   On: 08/23/2021 12:42   DG Chest Port 1 View  Result Date: 08/22/2021 CLINICAL DATA:  Questionable sepsis. EXAM: PORTABLE CHEST 1 VIEW COMPARISON:  August 18, 2021. FINDINGS: Patient is rotated. Aortic atherosclerosis. Mediastinal contours or stable with mildly tortuous thoracic aorta. Normal size heart. Chronic senescent lung changes. Streaky bibasilar opacities are similar to prior. No visible pleural effusion or pneumothorax. The visualized skeletal structures are unchanged including chronic left lateral fifth rib fracture. IMPRESSION: 1. No acute cardiopulmonary finding. 2.  Aortic Atherosclerosis (ICD10-I70.0). Electronically Signed   By: Dahlia Bailiff M.D.   On: 08/22/2021 15:25   ECHOCARDIOGRAM COMPLETE  Result Date: 08/31/2021    ECHOCARDIOGRAM REPORT   Patient Name:   Mathew Miller. Date of Exam: 08/31/2021 Medical Rec #:  [id number]           Height:       74.0 in Accession #:    [id number]          Weight:       155.9 lb Date of Birth:  1946/06/04           BSA:           1.955 m Patient Age:    75 years            BP:           95/60 mmHg Patient Gender: M                   HR:           68 bpm. Exam Location:  Inpatient Procedure: 2D Echo, Cardiac Doppler, Color Doppler and Intracardiac            Opacification Agent Indications:    Fever  History:        Patient has prior history of Echocardiogram examinations, most  or focal lesion. Sinuses/Orbits: No acute finding. Other: None. IMPRESSION: 1. No acute intracranial abnormality. 2. Marked brain parenchymal atrophy and chronic microvascular disease. Electronically Signed   By: Fidela Salisbury M.D.   On: 08/22/2021 16:36   CT HEAD WO CONTRAST (5MM)  Result Date: 08/19/2021 CLINICAL DATA:  Facial trauma EXAM: CT HEAD WITHOUT CONTRAST TECHNIQUE: Contiguous axial images were obtained from the base of the skull through the vertex without intravenous contrast. COMPARISON:  12/01/2020 FINDINGS: Brain: No evidence of acute infarction, hemorrhage, hydrocephalus, extra-axial collection or mass lesion/mass effect. Global cortical atrophy. Subcortical white matter and periventricular small vessel ischemic changes. Vascular: No hyperdense vessel or unexpected calcification. Skull: Normal. Negative for fracture or focal lesion. Sinuses/Orbits: The visualized paranasal sinuses are essentially clear. The mastoid air cells are unopacified. Other: None. IMPRESSION: No  evidence of acute intracranial abnormality. Atrophy with small vessel ischemic changes. Electronically Signed   By: Julian Hy M.D.   On: 08/19/2021 01:33   CT CHEST WO CONTRAST  Result Date: 08/23/2021 CLINICAL DATA:  Fever of unknown origin. EXAM: CT CHEST WITHOUT CONTRAST TECHNIQUE: Multidetector CT imaging of the chest was performed following the standard protocol without IV contrast. COMPARISON:  None. FINDINGS: Cardiovascular: No acute findings. Aortic and coronary atherosclerotic calcification noted. Mediastinum/Nodes: No masses or pathologically enlarged lymph nodes identified on this unenhanced exam. Lungs/Pleura: No suspicious nodules or masses identified. No evidence of infiltrate or pleural effusion. Upper Abdomen:  Unremarkable. Musculoskeletal:  No suspicious bone lesions. IMPRESSION: No active disease within the thorax. Aortic Atherosclerosis (ICD10-I70.0). Electronically Signed   By: Marlaine Hind M.D.   On: 08/23/2021 12:42   DG Chest Port 1 View  Result Date: 08/22/2021 CLINICAL DATA:  Questionable sepsis. EXAM: PORTABLE CHEST 1 VIEW COMPARISON:  August 18, 2021. FINDINGS: Patient is rotated. Aortic atherosclerosis. Mediastinal contours or stable with mildly tortuous thoracic aorta. Normal size heart. Chronic senescent lung changes. Streaky bibasilar opacities are similar to prior. No visible pleural effusion or pneumothorax. The visualized skeletal structures are unchanged including chronic left lateral fifth rib fracture. IMPRESSION: 1. No acute cardiopulmonary finding. 2.  Aortic Atherosclerosis (ICD10-I70.0). Electronically Signed   By: Dahlia Bailiff M.D.   On: 08/22/2021 15:25   ECHOCARDIOGRAM COMPLETE  Result Date: 08/31/2021    ECHOCARDIOGRAM REPORT   Patient Name:   Mathew Miller. Date of Exam: 08/31/2021 Medical Rec #:  [id number]           Height:       74.0 in Accession #:    [id number]          Weight:       155.9 lb Date of Birth:  1946/06/04           BSA:           1.955 m Patient Age:    75 years            BP:           95/60 mmHg Patient Gender: M                   HR:           68 bpm. Exam Location:  Inpatient Procedure: 2D Echo, Cardiac Doppler, Color Doppler and Intracardiac            Opacification Agent Indications:    Fever  History:        Patient has prior history of Echocardiogram examinations, most  or focal lesion. Sinuses/Orbits: No acute finding. Other: None. IMPRESSION: 1. No acute intracranial abnormality. 2. Marked brain parenchymal atrophy and chronic microvascular disease. Electronically Signed   By: Fidela Salisbury M.D.   On: 08/22/2021 16:36   CT HEAD WO CONTRAST (5MM)  Result Date: 08/19/2021 CLINICAL DATA:  Facial trauma EXAM: CT HEAD WITHOUT CONTRAST TECHNIQUE: Contiguous axial images were obtained from the base of the skull through the vertex without intravenous contrast. COMPARISON:  12/01/2020 FINDINGS: Brain: No evidence of acute infarction, hemorrhage, hydrocephalus, extra-axial collection or mass lesion/mass effect. Global cortical atrophy. Subcortical white matter and periventricular small vessel ischemic changes. Vascular: No hyperdense vessel or unexpected calcification. Skull: Normal. Negative for fracture or focal lesion. Sinuses/Orbits: The visualized paranasal sinuses are essentially clear. The mastoid air cells are unopacified. Other: None. IMPRESSION: No  evidence of acute intracranial abnormality. Atrophy with small vessel ischemic changes. Electronically Signed   By: Julian Hy M.D.   On: 08/19/2021 01:33   CT CHEST WO CONTRAST  Result Date: 08/23/2021 CLINICAL DATA:  Fever of unknown origin. EXAM: CT CHEST WITHOUT CONTRAST TECHNIQUE: Multidetector CT imaging of the chest was performed following the standard protocol without IV contrast. COMPARISON:  None. FINDINGS: Cardiovascular: No acute findings. Aortic and coronary atherosclerotic calcification noted. Mediastinum/Nodes: No masses or pathologically enlarged lymph nodes identified on this unenhanced exam. Lungs/Pleura: No suspicious nodules or masses identified. No evidence of infiltrate or pleural effusion. Upper Abdomen:  Unremarkable. Musculoskeletal:  No suspicious bone lesions. IMPRESSION: No active disease within the thorax. Aortic Atherosclerosis (ICD10-I70.0). Electronically Signed   By: Marlaine Hind M.D.   On: 08/23/2021 12:42   DG Chest Port 1 View  Result Date: 08/22/2021 CLINICAL DATA:  Questionable sepsis. EXAM: PORTABLE CHEST 1 VIEW COMPARISON:  August 18, 2021. FINDINGS: Patient is rotated. Aortic atherosclerosis. Mediastinal contours or stable with mildly tortuous thoracic aorta. Normal size heart. Chronic senescent lung changes. Streaky bibasilar opacities are similar to prior. No visible pleural effusion or pneumothorax. The visualized skeletal structures are unchanged including chronic left lateral fifth rib fracture. IMPRESSION: 1. No acute cardiopulmonary finding. 2.  Aortic Atherosclerosis (ICD10-I70.0). Electronically Signed   By: Dahlia Bailiff M.D.   On: 08/22/2021 15:25   ECHOCARDIOGRAM COMPLETE  Result Date: 08/31/2021    ECHOCARDIOGRAM REPORT   Patient Name:   Mathew Miller. Date of Exam: 08/31/2021 Medical Rec #:  [id number]           Height:       74.0 in Accession #:    [id number]          Weight:       155.9 lb Date of Birth:  1946/06/04           BSA:           1.955 m Patient Age:    75 years            BP:           95/60 mmHg Patient Gender: M                   HR:           68 bpm. Exam Location:  Inpatient Procedure: 2D Echo, Cardiac Doppler, Color Doppler and Intracardiac            Opacification Agent Indications:    Fever  History:        Patient has prior history of Echocardiogram examinations, most  or focal lesion. Sinuses/Orbits: No acute finding. Other: None. IMPRESSION: 1. No acute intracranial abnormality. 2. Marked brain parenchymal atrophy and chronic microvascular disease. Electronically Signed   By: Fidela Salisbury M.D.   On: 08/22/2021 16:36   CT HEAD WO CONTRAST (5MM)  Result Date: 08/19/2021 CLINICAL DATA:  Facial trauma EXAM: CT HEAD WITHOUT CONTRAST TECHNIQUE: Contiguous axial images were obtained from the base of the skull through the vertex without intravenous contrast. COMPARISON:  12/01/2020 FINDINGS: Brain: No evidence of acute infarction, hemorrhage, hydrocephalus, extra-axial collection or mass lesion/mass effect. Global cortical atrophy. Subcortical white matter and periventricular small vessel ischemic changes. Vascular: No hyperdense vessel or unexpected calcification. Skull: Normal. Negative for fracture or focal lesion. Sinuses/Orbits: The visualized paranasal sinuses are essentially clear. The mastoid air cells are unopacified. Other: None. IMPRESSION: No  evidence of acute intracranial abnormality. Atrophy with small vessel ischemic changes. Electronically Signed   By: Julian Hy M.D.   On: 08/19/2021 01:33   CT CHEST WO CONTRAST  Result Date: 08/23/2021 CLINICAL DATA:  Fever of unknown origin. EXAM: CT CHEST WITHOUT CONTRAST TECHNIQUE: Multidetector CT imaging of the chest was performed following the standard protocol without IV contrast. COMPARISON:  None. FINDINGS: Cardiovascular: No acute findings. Aortic and coronary atherosclerotic calcification noted. Mediastinum/Nodes: No masses or pathologically enlarged lymph nodes identified on this unenhanced exam. Lungs/Pleura: No suspicious nodules or masses identified. No evidence of infiltrate or pleural effusion. Upper Abdomen:  Unremarkable. Musculoskeletal:  No suspicious bone lesions. IMPRESSION: No active disease within the thorax. Aortic Atherosclerosis (ICD10-I70.0). Electronically Signed   By: Marlaine Hind M.D.   On: 08/23/2021 12:42   DG Chest Port 1 View  Result Date: 08/22/2021 CLINICAL DATA:  Questionable sepsis. EXAM: PORTABLE CHEST 1 VIEW COMPARISON:  August 18, 2021. FINDINGS: Patient is rotated. Aortic atherosclerosis. Mediastinal contours or stable with mildly tortuous thoracic aorta. Normal size heart. Chronic senescent lung changes. Streaky bibasilar opacities are similar to prior. No visible pleural effusion or pneumothorax. The visualized skeletal structures are unchanged including chronic left lateral fifth rib fracture. IMPRESSION: 1. No acute cardiopulmonary finding. 2.  Aortic Atherosclerosis (ICD10-I70.0). Electronically Signed   By: Dahlia Bailiff M.D.   On: 08/22/2021 15:25   ECHOCARDIOGRAM COMPLETE  Result Date: 08/31/2021    ECHOCARDIOGRAM REPORT   Patient Name:   Mathew Miller. Date of Exam: 08/31/2021 Medical Rec #:  [id number]           Height:       74.0 in Accession #:    [id number]          Weight:       155.9 lb Date of Birth:  1946/06/04           BSA:           1.955 m Patient Age:    75 years            BP:           95/60 mmHg Patient Gender: M                   HR:           68 bpm. Exam Location:  Inpatient Procedure: 2D Echo, Cardiac Doppler, Color Doppler and Intracardiac            Opacification Agent Indications:    Fever  History:        Patient has prior history of Echocardiogram examinations, most  or focal lesion. Sinuses/Orbits: No acute finding. Other: None. IMPRESSION: 1. No acute intracranial abnormality. 2. Marked brain parenchymal atrophy and chronic microvascular disease. Electronically Signed   By: Fidela Salisbury M.D.   On: 08/22/2021 16:36   CT HEAD WO CONTRAST (5MM)  Result Date: 08/19/2021 CLINICAL DATA:  Facial trauma EXAM: CT HEAD WITHOUT CONTRAST TECHNIQUE: Contiguous axial images were obtained from the base of the skull through the vertex without intravenous contrast. COMPARISON:  12/01/2020 FINDINGS: Brain: No evidence of acute infarction, hemorrhage, hydrocephalus, extra-axial collection or mass lesion/mass effect. Global cortical atrophy. Subcortical white matter and periventricular small vessel ischemic changes. Vascular: No hyperdense vessel or unexpected calcification. Skull: Normal. Negative for fracture or focal lesion. Sinuses/Orbits: The visualized paranasal sinuses are essentially clear. The mastoid air cells are unopacified. Other: None. IMPRESSION: No  evidence of acute intracranial abnormality. Atrophy with small vessel ischemic changes. Electronically Signed   By: Julian Hy M.D.   On: 08/19/2021 01:33   CT CHEST WO CONTRAST  Result Date: 08/23/2021 CLINICAL DATA:  Fever of unknown origin. EXAM: CT CHEST WITHOUT CONTRAST TECHNIQUE: Multidetector CT imaging of the chest was performed following the standard protocol without IV contrast. COMPARISON:  None. FINDINGS: Cardiovascular: No acute findings. Aortic and coronary atherosclerotic calcification noted. Mediastinum/Nodes: No masses or pathologically enlarged lymph nodes identified on this unenhanced exam. Lungs/Pleura: No suspicious nodules or masses identified. No evidence of infiltrate or pleural effusion. Upper Abdomen:  Unremarkable. Musculoskeletal:  No suspicious bone lesions. IMPRESSION: No active disease within the thorax. Aortic Atherosclerosis (ICD10-I70.0). Electronically Signed   By: Marlaine Hind M.D.   On: 08/23/2021 12:42   DG Chest Port 1 View  Result Date: 08/22/2021 CLINICAL DATA:  Questionable sepsis. EXAM: PORTABLE CHEST 1 VIEW COMPARISON:  August 18, 2021. FINDINGS: Patient is rotated. Aortic atherosclerosis. Mediastinal contours or stable with mildly tortuous thoracic aorta. Normal size heart. Chronic senescent lung changes. Streaky bibasilar opacities are similar to prior. No visible pleural effusion or pneumothorax. The visualized skeletal structures are unchanged including chronic left lateral fifth rib fracture. IMPRESSION: 1. No acute cardiopulmonary finding. 2.  Aortic Atherosclerosis (ICD10-I70.0). Electronically Signed   By: Dahlia Bailiff M.D.   On: 08/22/2021 15:25   ECHOCARDIOGRAM COMPLETE  Result Date: 08/31/2021    ECHOCARDIOGRAM REPORT   Patient Name:   Mathew Miller. Date of Exam: 08/31/2021 Medical Rec #:  [id number]           Height:       74.0 in Accession #:    [id number]          Weight:       155.9 lb Date of Birth:  1946/06/04           BSA:           1.955 m Patient Age:    75 years            BP:           95/60 mmHg Patient Gender: M                   HR:           68 bpm. Exam Location:  Inpatient Procedure: 2D Echo, Cardiac Doppler, Color Doppler and Intracardiac            Opacification Agent Indications:    Fever  History:        Patient has prior history of Echocardiogram examinations, most  or focal lesion. Sinuses/Orbits: No acute finding. Other: None. IMPRESSION: 1. No acute intracranial abnormality. 2. Marked brain parenchymal atrophy and chronic microvascular disease. Electronically Signed   By: Fidela Salisbury M.D.   On: 08/22/2021 16:36   CT HEAD WO CONTRAST (5MM)  Result Date: 08/19/2021 CLINICAL DATA:  Facial trauma EXAM: CT HEAD WITHOUT CONTRAST TECHNIQUE: Contiguous axial images were obtained from the base of the skull through the vertex without intravenous contrast. COMPARISON:  12/01/2020 FINDINGS: Brain: No evidence of acute infarction, hemorrhage, hydrocephalus, extra-axial collection or mass lesion/mass effect. Global cortical atrophy. Subcortical white matter and periventricular small vessel ischemic changes. Vascular: No hyperdense vessel or unexpected calcification. Skull: Normal. Negative for fracture or focal lesion. Sinuses/Orbits: The visualized paranasal sinuses are essentially clear. The mastoid air cells are unopacified. Other: None. IMPRESSION: No  evidence of acute intracranial abnormality. Atrophy with small vessel ischemic changes. Electronically Signed   By: Julian Hy M.D.   On: 08/19/2021 01:33   CT CHEST WO CONTRAST  Result Date: 08/23/2021 CLINICAL DATA:  Fever of unknown origin. EXAM: CT CHEST WITHOUT CONTRAST TECHNIQUE: Multidetector CT imaging of the chest was performed following the standard protocol without IV contrast. COMPARISON:  None. FINDINGS: Cardiovascular: No acute findings. Aortic and coronary atherosclerotic calcification noted. Mediastinum/Nodes: No masses or pathologically enlarged lymph nodes identified on this unenhanced exam. Lungs/Pleura: No suspicious nodules or masses identified. No evidence of infiltrate or pleural effusion. Upper Abdomen:  Unremarkable. Musculoskeletal:  No suspicious bone lesions. IMPRESSION: No active disease within the thorax. Aortic Atherosclerosis (ICD10-I70.0). Electronically Signed   By: Marlaine Hind M.D.   On: 08/23/2021 12:42   DG Chest Port 1 View  Result Date: 08/22/2021 CLINICAL DATA:  Questionable sepsis. EXAM: PORTABLE CHEST 1 VIEW COMPARISON:  August 18, 2021. FINDINGS: Patient is rotated. Aortic atherosclerosis. Mediastinal contours or stable with mildly tortuous thoracic aorta. Normal size heart. Chronic senescent lung changes. Streaky bibasilar opacities are similar to prior. No visible pleural effusion or pneumothorax. The visualized skeletal structures are unchanged including chronic left lateral fifth rib fracture. IMPRESSION: 1. No acute cardiopulmonary finding. 2.  Aortic Atherosclerosis (ICD10-I70.0). Electronically Signed   By: Dahlia Bailiff M.D.   On: 08/22/2021 15:25   ECHOCARDIOGRAM COMPLETE  Result Date: 08/31/2021    ECHOCARDIOGRAM REPORT   Patient Name:   Mathew Miller. Date of Exam: 08/31/2021 Medical Rec #:  [id number]           Height:       74.0 in Accession #:    [id number]          Weight:       155.9 lb Date of Birth:  1946/06/04           BSA:           1.955 m Patient Age:    75 years            BP:           95/60 mmHg Patient Gender: M                   HR:           68 bpm. Exam Location:  Inpatient Procedure: 2D Echo, Cardiac Doppler, Color Doppler and Intracardiac            Opacification Agent Indications:    Fever  History:        Patient has prior history of Echocardiogram examinations, most  or focal lesion. Sinuses/Orbits: No acute finding. Other: None. IMPRESSION: 1. No acute intracranial abnormality. 2. Marked brain parenchymal atrophy and chronic microvascular disease. Electronically Signed   By: Fidela Salisbury M.D.   On: 08/22/2021 16:36   CT HEAD WO CONTRAST (5MM)  Result Date: 08/19/2021 CLINICAL DATA:  Facial trauma EXAM: CT HEAD WITHOUT CONTRAST TECHNIQUE: Contiguous axial images were obtained from the base of the skull through the vertex without intravenous contrast. COMPARISON:  12/01/2020 FINDINGS: Brain: No evidence of acute infarction, hemorrhage, hydrocephalus, extra-axial collection or mass lesion/mass effect. Global cortical atrophy. Subcortical white matter and periventricular small vessel ischemic changes. Vascular: No hyperdense vessel or unexpected calcification. Skull: Normal. Negative for fracture or focal lesion. Sinuses/Orbits: The visualized paranasal sinuses are essentially clear. The mastoid air cells are unopacified. Other: None. IMPRESSION: No  evidence of acute intracranial abnormality. Atrophy with small vessel ischemic changes. Electronically Signed   By: Julian Hy M.D.   On: 08/19/2021 01:33   CT CHEST WO CONTRAST  Result Date: 08/23/2021 CLINICAL DATA:  Fever of unknown origin. EXAM: CT CHEST WITHOUT CONTRAST TECHNIQUE: Multidetector CT imaging of the chest was performed following the standard protocol without IV contrast. COMPARISON:  None. FINDINGS: Cardiovascular: No acute findings. Aortic and coronary atherosclerotic calcification noted. Mediastinum/Nodes: No masses or pathologically enlarged lymph nodes identified on this unenhanced exam. Lungs/Pleura: No suspicious nodules or masses identified. No evidence of infiltrate or pleural effusion. Upper Abdomen:  Unremarkable. Musculoskeletal:  No suspicious bone lesions. IMPRESSION: No active disease within the thorax. Aortic Atherosclerosis (ICD10-I70.0). Electronically Signed   By: Marlaine Hind M.D.   On: 08/23/2021 12:42   DG Chest Port 1 View  Result Date: 08/22/2021 CLINICAL DATA:  Questionable sepsis. EXAM: PORTABLE CHEST 1 VIEW COMPARISON:  August 18, 2021. FINDINGS: Patient is rotated. Aortic atherosclerosis. Mediastinal contours or stable with mildly tortuous thoracic aorta. Normal size heart. Chronic senescent lung changes. Streaky bibasilar opacities are similar to prior. No visible pleural effusion or pneumothorax. The visualized skeletal structures are unchanged including chronic left lateral fifth rib fracture. IMPRESSION: 1. No acute cardiopulmonary finding. 2.  Aortic Atherosclerosis (ICD10-I70.0). Electronically Signed   By: Dahlia Bailiff M.D.   On: 08/22/2021 15:25   ECHOCARDIOGRAM COMPLETE  Result Date: 08/31/2021    ECHOCARDIOGRAM REPORT   Patient Name:   Mathew Miller. Date of Exam: 08/31/2021 Medical Rec #:  [id number]           Height:       74.0 in Accession #:    [id number]          Weight:       155.9 lb Date of Birth:  1946/06/04           BSA:           1.955 m Patient Age:    75 years            BP:           95/60 mmHg Patient Gender: M                   HR:           68 bpm. Exam Location:  Inpatient Procedure: 2D Echo, Cardiac Doppler, Color Doppler and Intracardiac            Opacification Agent Indications:    Fever  History:        Patient has prior history of Echocardiogram examinations, most  or focal lesion. Sinuses/Orbits: No acute finding. Other: None. IMPRESSION: 1. No acute intracranial abnormality. 2. Marked brain parenchymal atrophy and chronic microvascular disease. Electronically Signed   By: Fidela Salisbury M.D.   On: 08/22/2021 16:36   CT HEAD WO CONTRAST (5MM)  Result Date: 08/19/2021 CLINICAL DATA:  Facial trauma EXAM: CT HEAD WITHOUT CONTRAST TECHNIQUE: Contiguous axial images were obtained from the base of the skull through the vertex without intravenous contrast. COMPARISON:  12/01/2020 FINDINGS: Brain: No evidence of acute infarction, hemorrhage, hydrocephalus, extra-axial collection or mass lesion/mass effect. Global cortical atrophy. Subcortical white matter and periventricular small vessel ischemic changes. Vascular: No hyperdense vessel or unexpected calcification. Skull: Normal. Negative for fracture or focal lesion. Sinuses/Orbits: The visualized paranasal sinuses are essentially clear. The mastoid air cells are unopacified. Other: None. IMPRESSION: No  evidence of acute intracranial abnormality. Atrophy with small vessel ischemic changes. Electronically Signed   By: Julian Hy M.D.   On: 08/19/2021 01:33   CT CHEST WO CONTRAST  Result Date: 08/23/2021 CLINICAL DATA:  Fever of unknown origin. EXAM: CT CHEST WITHOUT CONTRAST TECHNIQUE: Multidetector CT imaging of the chest was performed following the standard protocol without IV contrast. COMPARISON:  None. FINDINGS: Cardiovascular: No acute findings. Aortic and coronary atherosclerotic calcification noted. Mediastinum/Nodes: No masses or pathologically enlarged lymph nodes identified on this unenhanced exam. Lungs/Pleura: No suspicious nodules or masses identified. No evidence of infiltrate or pleural effusion. Upper Abdomen:  Unremarkable. Musculoskeletal:  No suspicious bone lesions. IMPRESSION: No active disease within the thorax. Aortic Atherosclerosis (ICD10-I70.0). Electronically Signed   By: Marlaine Hind M.D.   On: 08/23/2021 12:42   DG Chest Port 1 View  Result Date: 08/22/2021 CLINICAL DATA:  Questionable sepsis. EXAM: PORTABLE CHEST 1 VIEW COMPARISON:  August 18, 2021. FINDINGS: Patient is rotated. Aortic atherosclerosis. Mediastinal contours or stable with mildly tortuous thoracic aorta. Normal size heart. Chronic senescent lung changes. Streaky bibasilar opacities are similar to prior. No visible pleural effusion or pneumothorax. The visualized skeletal structures are unchanged including chronic left lateral fifth rib fracture. IMPRESSION: 1. No acute cardiopulmonary finding. 2.  Aortic Atherosclerosis (ICD10-I70.0). Electronically Signed   By: Dahlia Bailiff M.D.   On: 08/22/2021 15:25   ECHOCARDIOGRAM COMPLETE  Result Date: 08/31/2021    ECHOCARDIOGRAM REPORT   Patient Name:   Mathew Miller. Date of Exam: 08/31/2021 Medical Rec #:  [id number]           Height:       74.0 in Accession #:    [id number]          Weight:       155.9 lb Date of Birth:  1946/06/04           BSA:           1.955 m Patient Age:    75 years            BP:           95/60 mmHg Patient Gender: M                   HR:           68 bpm. Exam Location:  Inpatient Procedure: 2D Echo, Cardiac Doppler, Color Doppler and Intracardiac            Opacification Agent Indications:    Fever  History:        Patient has prior history of Echocardiogram examinations, most  157.00 cm/s  PV Vmax:       1.07 m/s AV Vmean:     111.000 cm/s PV Peak grad:  4.6 mmHg AV VTI:       0.331 m AV Peak Grad: 9.9 mmHg AV Mean Grad: 6.0 mmHg  AORTA Ao Root diam: 3.50 cm MITRAL VALVE MV Area (PHT): 3.31 cm    SHUNTS MV Decel Time: 229 msec    Systemic Diam: 1.70 cm MV E velocity: 68.10 cm/s MV A velocity: 93.40 cm/s MV E/A  ratio:  0.73 Kardie Tobb DO Electronically signed by Berniece Salines DO Signature Date/Time: 08/31/2021/2:17:09 PM    Final    ECHOCARDIOGRAM COMPLETE  Result Date: 08/23/2021    ECHOCARDIOGRAM REPORT   Patient Name:   Mathew Miller. Date of Exam: 08/23/2021 Medical Rec #:  [id number]           Height:       74.0 in Accession #:    [id number]          Weight:       160.0 lb Date of Birth:  [birth year]           BSA:          1.977 m Patient Age:    29 years            BP:           170/82 mmHg Patient Gender: M                   HR:           60 bpm. Exam Location:  Inpatient Procedure: 2D Echo Indications:    bacteremia  History:        Patient has prior history of Echocardiogram examinations, most                 recent 07/16/2020. Signs/Symptoms:Fever; Risk                 Factors:Dyslipidemia.  Sonographer:    Johny Chess RDCS Referring Phys: [phone] [facility] [place]  Sonographer Comments: Image acquisition challenging due to uncooperative patient. IMPRESSIONS  1. Left ventricular ejection fraction, by estimation, is 60 to 65%. The left ventricle has normal function. The left ventricle has no regional wall motion abnormalities. Left ventricular diastolic parameters are consistent with Grade I diastolic dysfunction (impaired relaxation).  2. Right ventricular systolic function is normal. The right ventricular size is normal. Tricuspid regurgitation signal is inadequate for assessing PA pressure.  3. The mitral valve is normal in structure. Trivial mitral valve regurgitation. No evidence of mitral stenosis.  4. The aortic valve has an indeterminant number of cusps. Aortic valve regurgitation is not visualized. No aortic stenosis is present. FINDINGS  Left Ventricle: Left ventricular ejection fraction, by estimation, is 60 to 65%. The left ventricle has normal function. The left ventricle has no regional wall motion abnormalities. The left ventricular internal cavity size was normal in size. There is  no left  ventricular hypertrophy. Left ventricular diastolic parameters are consistent with Grade I diastolic dysfunction (impaired relaxation). Normal left ventricular filling pressure. Right Ventricle: The right ventricular size is normal. Right vetricular wall thickness was not well visualized. Right ventricular systolic function is normal. Tricuspid regurgitation signal is inadequate for assessing PA pressure. Left Atrium: Left atrial size was normal in size. Right Atrium: Right atrial size was not well visualized. Pericardium: The pericardium was not well visualized. Mitral Valve: The mitral valve is normal  or focal lesion. Sinuses/Orbits: No acute finding. Other: None. IMPRESSION: 1. No acute intracranial abnormality. 2. Marked brain parenchymal atrophy and chronic microvascular disease. Electronically Signed   By: Fidela Salisbury M.D.   On: 08/22/2021 16:36   CT HEAD WO CONTRAST (5MM)  Result Date: 08/19/2021 CLINICAL DATA:  Facial trauma EXAM: CT HEAD WITHOUT CONTRAST TECHNIQUE: Contiguous axial images were obtained from the base of the skull through the vertex without intravenous contrast. COMPARISON:  12/01/2020 FINDINGS: Brain: No evidence of acute infarction, hemorrhage, hydrocephalus, extra-axial collection or mass lesion/mass effect. Global cortical atrophy. Subcortical white matter and periventricular small vessel ischemic changes. Vascular: No hyperdense vessel or unexpected calcification. Skull: Normal. Negative for fracture or focal lesion. Sinuses/Orbits: The visualized paranasal sinuses are essentially clear. The mastoid air cells are unopacified. Other: None. IMPRESSION: No  evidence of acute intracranial abnormality. Atrophy with small vessel ischemic changes. Electronically Signed   By: Julian Hy M.D.   On: 08/19/2021 01:33   CT CHEST WO CONTRAST  Result Date: 08/23/2021 CLINICAL DATA:  Fever of unknown origin. EXAM: CT CHEST WITHOUT CONTRAST TECHNIQUE: Multidetector CT imaging of the chest was performed following the standard protocol without IV contrast. COMPARISON:  None. FINDINGS: Cardiovascular: No acute findings. Aortic and coronary atherosclerotic calcification noted. Mediastinum/Nodes: No masses or pathologically enlarged lymph nodes identified on this unenhanced exam. Lungs/Pleura: No suspicious nodules or masses identified. No evidence of infiltrate or pleural effusion. Upper Abdomen:  Unremarkable. Musculoskeletal:  No suspicious bone lesions. IMPRESSION: No active disease within the thorax. Aortic Atherosclerosis (ICD10-I70.0). Electronically Signed   By: Marlaine Hind M.D.   On: 08/23/2021 12:42   DG Chest Port 1 View  Result Date: 08/22/2021 CLINICAL DATA:  Questionable sepsis. EXAM: PORTABLE CHEST 1 VIEW COMPARISON:  August 18, 2021. FINDINGS: Patient is rotated. Aortic atherosclerosis. Mediastinal contours or stable with mildly tortuous thoracic aorta. Normal size heart. Chronic senescent lung changes. Streaky bibasilar opacities are similar to prior. No visible pleural effusion or pneumothorax. The visualized skeletal structures are unchanged including chronic left lateral fifth rib fracture. IMPRESSION: 1. No acute cardiopulmonary finding. 2.  Aortic Atherosclerosis (ICD10-I70.0). Electronically Signed   By: Dahlia Bailiff M.D.   On: 08/22/2021 15:25   ECHOCARDIOGRAM COMPLETE  Result Date: 08/31/2021    ECHOCARDIOGRAM REPORT   Patient Name:   Mathew Miller. Date of Exam: 08/31/2021 Medical Rec #:  [id number]           Height:       74.0 in Accession #:    [id number]          Weight:       155.9 lb Date of Birth:  1946/06/04           BSA:           1.955 m Patient Age:    75 years            BP:           95/60 mmHg Patient Gender: M                   HR:           68 bpm. Exam Location:  Inpatient Procedure: 2D Echo, Cardiac Doppler, Color Doppler and Intracardiac            Opacification Agent Indications:    Fever  History:        Patient has prior history of Echocardiogram examinations, most  or focal lesion. Sinuses/Orbits: No acute finding. Other: None. IMPRESSION: 1. No acute intracranial abnormality. 2. Marked brain parenchymal atrophy and chronic microvascular disease. Electronically Signed   By: Fidela Salisbury M.D.   On: 08/22/2021 16:36   CT HEAD WO CONTRAST (5MM)  Result Date: 08/19/2021 CLINICAL DATA:  Facial trauma EXAM: CT HEAD WITHOUT CONTRAST TECHNIQUE: Contiguous axial images were obtained from the base of the skull through the vertex without intravenous contrast. COMPARISON:  12/01/2020 FINDINGS: Brain: No evidence of acute infarction, hemorrhage, hydrocephalus, extra-axial collection or mass lesion/mass effect. Global cortical atrophy. Subcortical white matter and periventricular small vessel ischemic changes. Vascular: No hyperdense vessel or unexpected calcification. Skull: Normal. Negative for fracture or focal lesion. Sinuses/Orbits: The visualized paranasal sinuses are essentially clear. The mastoid air cells are unopacified. Other: None. IMPRESSION: No  evidence of acute intracranial abnormality. Atrophy with small vessel ischemic changes. Electronically Signed   By: Julian Hy M.D.   On: 08/19/2021 01:33   CT CHEST WO CONTRAST  Result Date: 08/23/2021 CLINICAL DATA:  Fever of unknown origin. EXAM: CT CHEST WITHOUT CONTRAST TECHNIQUE: Multidetector CT imaging of the chest was performed following the standard protocol without IV contrast. COMPARISON:  None. FINDINGS: Cardiovascular: No acute findings. Aortic and coronary atherosclerotic calcification noted. Mediastinum/Nodes: No masses or pathologically enlarged lymph nodes identified on this unenhanced exam. Lungs/Pleura: No suspicious nodules or masses identified. No evidence of infiltrate or pleural effusion. Upper Abdomen:  Unremarkable. Musculoskeletal:  No suspicious bone lesions. IMPRESSION: No active disease within the thorax. Aortic Atherosclerosis (ICD10-I70.0). Electronically Signed   By: Marlaine Hind M.D.   On: 08/23/2021 12:42   DG Chest Port 1 View  Result Date: 08/22/2021 CLINICAL DATA:  Questionable sepsis. EXAM: PORTABLE CHEST 1 VIEW COMPARISON:  August 18, 2021. FINDINGS: Patient is rotated. Aortic atherosclerosis. Mediastinal contours or stable with mildly tortuous thoracic aorta. Normal size heart. Chronic senescent lung changes. Streaky bibasilar opacities are similar to prior. No visible pleural effusion or pneumothorax. The visualized skeletal structures are unchanged including chronic left lateral fifth rib fracture. IMPRESSION: 1. No acute cardiopulmonary finding. 2.  Aortic Atherosclerosis (ICD10-I70.0). Electronically Signed   By: Dahlia Bailiff M.D.   On: 08/22/2021 15:25   ECHOCARDIOGRAM COMPLETE  Result Date: 08/31/2021    ECHOCARDIOGRAM REPORT   Patient Name:   Mathew Miller. Date of Exam: 08/31/2021 Medical Rec #:  [id number]           Height:       74.0 in Accession #:    [id number]          Weight:       155.9 lb Date of Birth:  1946/06/04           BSA:           1.955 m Patient Age:    75 years            BP:           95/60 mmHg Patient Gender: M                   HR:           68 bpm. Exam Location:  Inpatient Procedure: 2D Echo, Cardiac Doppler, Color Doppler and Intracardiac            Opacification Agent Indications:    Fever  History:        Patient has prior history of Echocardiogram examinations, most

## 2021-09-01 NOTE — Progress Notes (Signed)
Physical Therapy Treatment Patient Details Name: Mathew Miller. MRN: [id number] DOB: [birth year] Today's Date: 09/01/2021   History of Present Illness Tomasi Garica. is a 75 y.o. male who presents with fever on 11/6 and found to have sepsis.  Previously admitted from 11/1-3 with the same, started on antibiotics, but no source was found and he was discharged with indwelling foley. PMH includes HTN; stage 3a CKD; advanced dementia; dysautonomia with postural hypotension; h/o CVA; and urinary retention with foley catheter placement, CVA.    PT Comments    Pt admitted with above diagnosis. Pt was able to sit EOB with mod to max assist for up to 20 min.  Pt did attempt to hold himself up for periods as well as tried to wash his face with cloth today.  This was much more than prior attempts.. Of note on arrival to room pt side flexed with head and trunk to right considerably. At end of treatment when PT laid pt back down, pt was in midline and centered in bed with pillows placed to maintain midline.  Plan is to d/c today. Pt currently with functional limitations due to balance and endurance deficits. Pt will benefit from skilled PT to increase their independence and safety with mobility to allow discharge to the venue listed below.      Recommendations for follow up therapy are one component of a multi-disciplinary discharge planning process, led by the attending physician.  Recommendations may be updated based on patient status, additional functional criteria and insurance authorization.  Follow Up Recommendations  Skilled nursing-short term rehab (<3 hours/day)     Assistance Recommended at Discharge Frequent or constant Supervision/Assistance  Equipment Recommendations  Wheelchair cushion (measurements PT);Hospital bed;Other (comment);Wheelchair (measurements PT) (lift)    Recommendations for Other Services OT consult     Precautions / Restrictions Precautions Precautions:  Fall Precaution Comments: Watch for signs of presyncope/syncope; history of orthostatic hypotension Restrictions Weight Bearing Restrictions: No     Mobility  Bed Mobility Overal bed mobility: Needs Assistance Bed Mobility: Supine to Sit;Sit to Supine;Rolling Rolling: Total assist;+2 for physical assistance   Supine to sit: Total assist;+2 for physical assistance Sit to supine: Total assist;+2 for physical assistance   General bed mobility comments: Pt verbalizing in pain while mobilizing to EOB, not initiating movement during transfer.    Transfers                   General transfer comment: Deferred    Ambulation/Gait               General Gait Details: Unable   Stairs             Wheelchair Mobility    Modified Rankin (Stroke Patients Only)       Balance Overall balance assessment: Needs assistance Sitting-balance support: Feet supported;No upper extremity supported Sitting balance-Leahy Scale: Poor Sitting balance - Comments: Requiring Mod - Max A to maintain balance at EOB - sat a total of 15 min. PT attempted to wash his face with his right hand. PT tried to stretch pt at upper trunk and neck to extension and some rotation attempted but pt dislikes stretching. Pt flexes trunk more and more as he fatigues. Postural control: Right lateral lean;Left lateral lean;Other (comment) (anterior lean)                                  Cognition Arousal/Alertness:  Lethargic;Awake/alert Behavior During Therapy: Flat affect Overall Cognitive Status: History of cognitive impairments - at baseline                                 General Comments: Continues to demonstrate difficulty communicating. Verbalizations difficult to comprehend at times or unrelated to topic. Remains with decreased initiation and ability to follow commands.        Exercises      General Comments General comments (skin integrity, edema, etc.): Wife  in room initially but left and went to car toward end of treatment.      Pertinent Vitals/Pain Pain Assessment: Faces Faces Pain Scale: Hurts even more Pain Location: back, R hand, L arm, neck Pain Descriptors / Indicators: Grimacing;Guarding;Moaning Pain Intervention(s): Limited activity within patient's tolerance;Monitored during session;Repositioned    Home Living                          Prior Function            PT Goals (current goals can now be found in the care plan section) Acute Rehab PT Goals Patient Stated Goal: Per Isabella Bowens, NP with Palliative, goal is to optimize mobility and ADLs at SNF for rehab, with the goal of getting back home Progress towards PT goals: Progressing toward goals    Frequency    Min 2X/week      PT Plan Current plan remains appropriate    Co-evaluation PT/OT/SLP Co-Evaluation/Treatment: Yes Reason for Co-Treatment: Complexity of the patient's impairments (multi-system involvement);For patient/therapist safety PT goals addressed during session: Mobility/safety with mobility OT goals addressed during session: ADL's and self-care      AM-PAC PT "6 Clicks" Mobility   Outcome Measure  Help needed turning from your back to your side while in a flat bed without using bedrails?: Total Help needed moving from lying on your back to sitting on the side of a flat bed without using bedrails?: Total Help needed moving to and from a bed to a chair (including a wheelchair)?: Total Help needed standing up from a chair using your arms (e.g., wheelchair or bedside chair)?: Total Help needed to walk in hospital room?: Total Help needed climbing 3-5 steps with a railing? : Total 6 Click Score: 6    End of Session Equipment Utilized During Treatment: Other (comment) (bed pad) Activity Tolerance: Patient limited by pain;Patient limited by fatigue Patient left: in bed;with call bell/phone within reach;with bed alarm set Nurse  Communication: Mobility status;Need for lift equipment PT Visit Diagnosis: Muscle weakness (generalized) (M62.81);Difficulty in walking, not elsewhere classified (R26.2);Pain Pain - Right/Left: Right Pain - part of body:  (back)     Time: 1037-1100 PT Time Calculation (min) (ACUTE ONLY): 23 min  Charges:  $Therapeutic Activity: 8-22 mins                     Destynie Toomey M,PT Acute Rehab Services [phone] (pager)    Bevelyn Buckles 09/01/2021, 11:53 AM

## 2021-09-01 NOTE — Progress Notes (Signed)
Report given to Rashonda, nurse at  [facility]. [facility] was contacted for an update on the transfer timeframe. Discharge paperwork in place for [facility], patient is dressed and ready. Iv removed.

## 2021-09-02 DIAGNOSIS — R531 Weakness: Secondary | ICD-10-CM | POA: Diagnosis not present

## 2021-09-02 DIAGNOSIS — A419 Sepsis, unspecified organism: Secondary | ICD-10-CM | POA: Diagnosis not present

## 2021-09-02 DIAGNOSIS — N390 Urinary tract infection, site not specified: Secondary | ICD-10-CM | POA: Diagnosis not present

## 2021-09-02 DIAGNOSIS — Z23 Encounter for immunization: Secondary | ICD-10-CM | POA: Diagnosis not present

## 2021-09-02 DIAGNOSIS — N179 Acute kidney failure, unspecified: Secondary | ICD-10-CM | POA: Diagnosis not present

## 2021-09-02 DIAGNOSIS — I10 Essential (primary) hypertension: Secondary | ICD-10-CM | POA: Diagnosis not present

## 2021-09-02 DIAGNOSIS — R4182 Altered mental status, unspecified: Secondary | ICD-10-CM | POA: Diagnosis not present

## 2021-09-02 DIAGNOSIS — D72829 Elevated white blood cell count, unspecified: Secondary | ICD-10-CM | POA: Diagnosis not present

## 2021-09-02 DIAGNOSIS — F5101 Primary insomnia: Secondary | ICD-10-CM | POA: Diagnosis not present

## 2021-09-02 DIAGNOSIS — A4189 Other specified sepsis: Secondary | ICD-10-CM | POA: Diagnosis not present

## 2021-09-02 DIAGNOSIS — R278 Other lack of coordination: Secondary | ICD-10-CM | POA: Diagnosis not present

## 2021-09-02 DIAGNOSIS — E782 Mixed hyperlipidemia: Secondary | ICD-10-CM | POA: Diagnosis not present

## 2021-09-02 DIAGNOSIS — R1312 Dysphagia, oropharyngeal phase: Secondary | ICD-10-CM | POA: Diagnosis not present

## 2021-09-02 DIAGNOSIS — S31000A Unspecified open wound of lower back and pelvis without penetration into retroperitoneum, initial encounter: Secondary | ICD-10-CM | POA: Diagnosis not present

## 2021-09-02 DIAGNOSIS — M6281 Muscle weakness (generalized): Secondary | ICD-10-CM | POA: Diagnosis not present

## 2021-09-02 DIAGNOSIS — Z515 Encounter for palliative care: Secondary | ICD-10-CM | POA: Diagnosis not present

## 2021-09-02 DIAGNOSIS — R2689 Other abnormalities of gait and mobility: Secondary | ICD-10-CM | POA: Diagnosis not present

## 2021-09-02 DIAGNOSIS — Z7401 Bed confinement status: Secondary | ICD-10-CM | POA: Diagnosis not present

## 2021-09-02 DIAGNOSIS — Z8673 Personal history of transient ischemic attack (TIA), and cerebral infarction without residual deficits: Secondary | ICD-10-CM | POA: Diagnosis not present

## 2021-09-02 DIAGNOSIS — R2681 Unsteadiness on feet: Secondary | ICD-10-CM | POA: Diagnosis not present

## 2021-09-02 DIAGNOSIS — E785 Hyperlipidemia, unspecified: Secondary | ICD-10-CM | POA: Diagnosis not present

## 2021-09-02 DIAGNOSIS — E46 Unspecified protein-calorie malnutrition: Secondary | ICD-10-CM | POA: Diagnosis not present

## 2021-09-02 DIAGNOSIS — F0390 Unspecified dementia without behavioral disturbance: Secondary | ICD-10-CM | POA: Diagnosis not present

## 2021-09-02 DIAGNOSIS — M2550 Pain in unspecified joint: Secondary | ICD-10-CM | POA: Diagnosis not present

## 2021-09-02 DIAGNOSIS — G9341 Metabolic encephalopathy: Secondary | ICD-10-CM | POA: Diagnosis not present

## 2021-09-02 DIAGNOSIS — N1831 Chronic kidney disease, stage 3a: Secondary | ICD-10-CM | POA: Diagnosis not present

## 2021-09-02 DIAGNOSIS — F03C0 Unspecified dementia, severe, without behavioral disturbance, psychotic disturbance, mood disturbance, and anxiety: Secondary | ICD-10-CM | POA: Diagnosis not present

## 2021-09-03 DIAGNOSIS — F0390 Unspecified dementia without behavioral disturbance: Secondary | ICD-10-CM | POA: Diagnosis not present

## 2021-09-03 DIAGNOSIS — Z8673 Personal history of transient ischemic attack (TIA), and cerebral infarction without residual deficits: Secondary | ICD-10-CM | POA: Diagnosis not present

## 2021-09-03 DIAGNOSIS — N1831 Chronic kidney disease, stage 3a: Secondary | ICD-10-CM | POA: Diagnosis not present

## 2021-09-03 DIAGNOSIS — A4189 Other specified sepsis: Secondary | ICD-10-CM | POA: Diagnosis not present

## 2021-09-03 DIAGNOSIS — E782 Mixed hyperlipidemia: Secondary | ICD-10-CM | POA: Diagnosis not present

## 2021-09-03 DIAGNOSIS — G9341 Metabolic encephalopathy: Secondary | ICD-10-CM | POA: Diagnosis not present

## 2021-09-03 DIAGNOSIS — I10 Essential (primary) hypertension: Secondary | ICD-10-CM | POA: Diagnosis not present

## 2021-09-03 LAB — CULTURE, BLOOD (ROUTINE X 2)
Culture: NO GROWTH
Culture: NO GROWTH

## 2021-09-04 DIAGNOSIS — Z8673 Personal history of transient ischemic attack (TIA), and cerebral infarction without residual deficits: Secondary | ICD-10-CM | POA: Diagnosis not present

## 2021-09-04 DIAGNOSIS — N1831 Chronic kidney disease, stage 3a: Secondary | ICD-10-CM | POA: Diagnosis not present

## 2021-09-04 DIAGNOSIS — I10 Essential (primary) hypertension: Secondary | ICD-10-CM | POA: Diagnosis not present

## 2021-09-04 DIAGNOSIS — G9341 Metabolic encephalopathy: Secondary | ICD-10-CM | POA: Diagnosis not present

## 2021-09-04 DIAGNOSIS — F0390 Unspecified dementia without behavioral disturbance: Secondary | ICD-10-CM | POA: Diagnosis not present

## 2021-09-04 DIAGNOSIS — E785 Hyperlipidemia, unspecified: Secondary | ICD-10-CM | POA: Diagnosis not present

## 2021-09-07 DIAGNOSIS — N1831 Chronic kidney disease, stage 3a: Secondary | ICD-10-CM | POA: Diagnosis not present

## 2021-09-07 DIAGNOSIS — E785 Hyperlipidemia, unspecified: Secondary | ICD-10-CM | POA: Diagnosis not present

## 2021-09-07 DIAGNOSIS — I10 Essential (primary) hypertension: Secondary | ICD-10-CM | POA: Diagnosis not present

## 2021-09-07 DIAGNOSIS — F0390 Unspecified dementia without behavioral disturbance: Secondary | ICD-10-CM | POA: Diagnosis not present

## 2021-09-07 DIAGNOSIS — G9341 Metabolic encephalopathy: Secondary | ICD-10-CM | POA: Diagnosis not present

## 2021-09-07 DIAGNOSIS — Z8673 Personal history of transient ischemic attack (TIA), and cerebral infarction without residual deficits: Secondary | ICD-10-CM | POA: Diagnosis not present

## 2021-09-09 ENCOUNTER — Other Ambulatory Visit: Payer: Self-pay

## 2021-09-09 ENCOUNTER — Non-Acute Institutional Stay: Payer: Self-pay | Admitting: Student

## 2021-09-09 DIAGNOSIS — E46 Unspecified protein-calorie malnutrition: Secondary | ICD-10-CM | POA: Diagnosis not present

## 2021-09-09 DIAGNOSIS — F03C0 Unspecified dementia, severe, without behavioral disturbance, psychotic disturbance, mood disturbance, and anxiety: Secondary | ICD-10-CM

## 2021-09-09 DIAGNOSIS — G9341 Metabolic encephalopathy: Secondary | ICD-10-CM | POA: Diagnosis not present

## 2021-09-09 DIAGNOSIS — R531 Weakness: Secondary | ICD-10-CM | POA: Diagnosis not present

## 2021-09-09 DIAGNOSIS — R4182 Altered mental status, unspecified: Secondary | ICD-10-CM | POA: Diagnosis not present

## 2021-09-09 DIAGNOSIS — I10 Essential (primary) hypertension: Secondary | ICD-10-CM | POA: Diagnosis not present

## 2021-09-09 DIAGNOSIS — N390 Urinary tract infection, site not specified: Secondary | ICD-10-CM

## 2021-09-09 DIAGNOSIS — N1831 Chronic kidney disease, stage 3a: Secondary | ICD-10-CM | POA: Diagnosis not present

## 2021-09-09 DIAGNOSIS — Z515 Encounter for palliative care: Secondary | ICD-10-CM | POA: Diagnosis not present

## 2021-09-09 DIAGNOSIS — F0390 Unspecified dementia without behavioral disturbance: Secondary | ICD-10-CM | POA: Diagnosis not present

## 2021-09-09 NOTE — Progress Notes (Signed)
Mathew Miller: [phone]  Fax: [phone]   Date of encounter: 09/09/21 3:25 PM PATIENT NAME: Mathew Miller Mathew Miller [id number]   [phone] (home)  DOB: 12-26-1945 MRN: [id number] PRIMARY CARE PROVIDER:    Dr. Shon Baton PROVIDER:   Vilinda Boehringer, NP  RESPONSIBLE PARTY:    Contact Information     Name Relation Home Work Mobile   Mathew Miller, Spiller [phone]  [phone]        I met face to face with patient and family in the facility. Palliative Care was asked to follow this patient by consultation request of  Vilinda Boehringer, NP to address advance care planning and complex medical decision making. This is the initial visit.                                     ASSESSMENT AND PLAN / RECOMMENDATIONS:   Advance Care Planning/Goals of Care: Goals include to maximize quality of life and symptom management. Patient/health care surrogate gave his/her permission to discuss.Our advance care planning conversation included a discussion about:    The value and importance of advance care planning  Experiences with loved ones who have been seriously ill or have died  Exploration of personal, cultural or spiritual beliefs that might influence medical decisions  Exploration of goals of care in the event of a sudden injury or illness  Decision not to resuscitate or to de-escalate disease focused treatments due to poor prognosis. CODE STATUS: DNR  Education provided on Palliative Medicine vs. Hospice services. Wife would like for patient to continue receiving therapy as tolerated, see how he does with antibiotics for current UTI. Patient does appear to be hospice eligible at this time. Will follow up with wife to see if he responds to antibiotics. Will refer to hospice if he continues to decline. Wife is unsure if patient will stay at facility; she is on a waiting list  for a facility that is closer to her home. She states she will be unable to care for patient in the home.   Symptom Management/Plan:  Dementia-patient is dependent for adl's. Staff to provide supportive care. Redirect/reorient as needed. Continue therapy as tolerated.   Generalized weakness-secondary to dementia, CVA, current UTI. Staff to assist with all adl care needs.   Recurrent UTI-patient to start on cipro x 7 days and IV meropenem x 7 days.   Protein calorie malnutrition-albumin 2.4. poor appetite continues; staff to continue assisting with meals. Continue nutritional supplement BID.   Follow up Palliative Care Visit: Palliative care will continue to follow for complex medical decision making, advance care planning, and clarification of goals. Return in 2 weeks or prn.  I spent 45 minutes providing this consultation. More than 50% of the time in this consultation was spent in counseling and care coordination.   PPS: 30%  HOSPICE ELIGIBILITY/DIAGNOSIS: TBD  Chief Complaint: Palliative Medicine initial consult.  HISTORY OF PRESENT ILLNESS:  [name]. is a 75 y.o. year old male  with dementia, hx of CVA, CKD 3a, hypertension, hyperlipidemia,  dysautonomia with postural hypotension, urinary retention with foley catheter in place. Patient recently hospitalized 11/5-11/15/22 due to fever, acute metabolic encephalopathy, moderate protein calorie malnutrition.  Patient currently at [facility]. He is receiving skilled therapy services. Staff nurse Tish states patient has another urinary  tract infection. He has been afebrile. He is to start on cipro x 7 days and IV meropenem x 7 days. He is awaiting IV placement. Wife states patient had a stroke a year ago; received home health therapy. He had progressed at home, walking with a walker. Able to go up and down several steps. When therapy stopped, patient regressed about a month ago with increased difficulty ambulating,  needing more assistance with adl's. Staff report patient being dependent for all adl's. He is sleeping throughout the day. Poor appetite, needs assist with feeding, increased weakness. He is also refusing to take medications at times. HPI and ROS obtained from wife and nursing staff due to patient's impaired cognition. Patient received resting in bed. He does answer few questions, but sleeps throughout most of visit. No anxiety or agitation observed. He does deny pain; does not exhibit any signs of pain or discomfort.   Weight: 176 pounds 1 year ago, 155 pounds 08/24/2021.  History obtained from review of EMR, discussion with primary team, and interview with family, facility staff/caregiver and/or Mr. Stuck.  I reviewed available labs, medications, imaging, studies and related documents from the EMR.  Records reviewed and summarized above.    Physical Exam:  Pulse 64, resp 16, sats 99% on room air Constitutional: NAD General: frail appearing, thin  EYES: anicteric sclera, lids intact, no discharge  ENMT: intact hearing, oral mucous membranes dry, dentition intact CV: S1S2, RRR, no LE edema Pulmonary: scattered rhonchi, no increased work of breathing, no cough, room air Abdomen: normo-active BS + 4 quadrants, soft and non tender, no ascites GU: deferred MSK: sarcopenia, moves all extremities, non-ambulatory Skin: warm and dry, no rashes or wounds on visible skin Neuro: +generalized weakness, A & O to person, wife.  Psych: non-anxious affect Hem/lymph/immuno: no widespread bruising CURRENT PROBLEM LIST:  Patient Active Problem List   Diagnosis Date Noted   Altered mental status    Malnutrition of moderate degree 08/24/2021   Staphylococcus epidermidis bacteremia    Fever 27/74/1287   Acute metabolic encephalopathy 86/76/7209   Sepsis ([place]) 08/19/2021   Acute-on-chronic kidney injury ([place]) 08/19/2021   Orthostatic hypotension 12/30/2020   Prolonged QT interval 12/30/2020   Dementia  ([place])    Palliative care by specialist    CVA (cerebral vascular accident) ([place]) 07/15/2020   Hypokalemia    B12 deficiency 03/26/2020   Unstable gait 10/01/2019   Frequent falls 10/01/2019   Imbalance 02/27/2018   BMI 24.0-24.9, adult 02/26/2018   Acid reflux 02/26/2018   Memory changes 02/26/2018   Vitamin D deficiency 03/01/2014   Goals of care, counseling/discussion 03/01/2014   Hyperlipidemia    History of prediabetes    HTN (hypertension) 10/09/2013   PAST MEDICAL HISTORY:  Active Ambulatory Problems    Diagnosis Date Noted   HTN (hypertension) 10/09/2013   Hyperlipidemia    History of prediabetes    Vitamin D deficiency 03/01/2014   Goals of care, counseling/discussion 03/01/2014   BMI 24.0-24.9, adult 02/26/2018   Acid reflux 02/26/2018   Memory changes 02/26/2018   Imbalance 02/27/2018   Unstable gait 10/01/2019   Frequent falls 10/01/2019   B12 deficiency 03/26/2020   CVA (cerebral vascular accident) ([place]) 07/15/2020   Hypokalemia    Dementia ([place])    Palliative care by specialist    Orthostatic hypotension 12/30/2020   Prolonged QT interval 12/30/2020   Sepsis ([place]) 08/19/2021   Acute-on-chronic kidney injury ([place]) 08/19/2021   Fever 47/06/6282   Acute metabolic encephalopathy 66/29/4765  Staphylococcus epidermidis bacteremia    Malnutrition of moderate degree 08/24/2021   Altered mental status    Resolved Ambulatory Problems    Diagnosis Date Noted   No Resolved Ambulatory Problems   Past Medical History:  Diagnosis Date   Constipation    Hypertension    Prediabetes    SOCIAL HX:  Social History   Tobacco Use   Smoking status: Never   Smokeless tobacco: Never  Substance Use Topics   Alcohol use: Yes    Alcohol/week: 0.0 standard drinks    Comment: very Rare /Beer.wine.   FAMILY HX:  Family History  Problem Relation Age of Onset   Hyperlipidemia Mother    Glaucoma Mother    Heart disease Father    Hypertension Father    Colon  cancer Neg Hx    Esophageal cancer Neg Hx    Pancreatic cancer Neg Hx    Prostate cancer Neg Hx    Rectal cancer Neg Hx    Stomach cancer Neg Hx       ALLERGIES:  Allergies  Allergen Reactions   Hydrocodone-Acetaminophen Nausea Only   Sudafed [Pseudoephedrine Hcl]     Urinary retention   Tetanus Toxoids     Itching, burning feeling, ran a fever     PERTINENT MEDICATIONS:  Outpatient Encounter Medications as of 09/09/2021  Medication Sig   acetaminophen (TYLENOL) 325 MG tablet Take 2 tablets (650 mg total) by mouth every 6 (six) hours as needed for mild pain, fever or headache.   Artificial Tear Ointment (DRY EYES OP) Apply 1 drop to eye 2 (two) times daily as needed (dry eyes).   aspirin EC 81 MG tablet Take 81 mg by mouth daily. Swallow whole.   BELSOMRA 15 MG TABS Take 15 mg by mouth at bedtime.   bisacodyl (DULCOLAX) 10 MG suppository Place 1 suppository (10 mg total) rectally daily as needed for moderate constipation.   calcium-vitamin D (OSCAL WITH D) 500MG-200UNIT (5MCG) tablet Take 2 tablets by mouth 3 (three) times daily.   Cyanocobalamin (B-12 PO) Take 1,000 mcg by mouth daily.   feeding supplement (ENSURE ENLIVE / ENSURE PLUS) LIQD Take 237 mLs by mouth 2 (two) times daily between meals.   loratadine (ALLERGY) 10 MG tablet Take 10 mg by mouth daily.   Magnesium 250 MG TABS Take 250 mg by mouth daily.   melatonin 5 MG TABS Take 1 tablet (5 mg total) by mouth at bedtime as needed.   multivitamin (RENA-VIT) TABS tablet Take 1 tablet by mouth at bedtime.   polyethylene glycol (MIRALAX / GLYCOLAX) 17 g packet Take 17 g by mouth daily as needed for mild constipation.   pyridostigmine (MESTINON) 60 MG tablet TAKE 1 TABLET IN THE MORNING AND AT BEDTIME (Patient taking differently: Take 60 mg by mouth in the morning and at bedtime.)   rosuvastatin (CRESTOR) 5 MG tablet TAKE 1 TABLET EVERY DAY FOR CHOLESTEROL (Patient taking differently: Take 5 mg by mouth daily.)   senna-docusate  (GNP STOOL SOFTENER/LAXATIVE) 8.6-50 MG tablet Take 1 tablet Daily for Constipation (Patient taking differently: Take 1 tablet by mouth daily.)   tamsulosin (FLOMAX) 0.4 MG CAPS capsule Take 1 capsule (0.4 mg total) by mouth daily after supper.   No facility-administered encounter medications on file as of 09/09/2021.   Thank you for the opportunity to participate in the care of Mr. Soja.  The palliative care team will continue to follow. Please call our office at [phone] if we can be of additional  assistance.   Ezekiel Slocumb, NP ,   COVID-19 PATIENT SCREENING TOOL Asked and negative response unless otherwise noted:  Have you had symptoms of covid, tested positive or been in contact with someone with symptoms/positive test in the past 5-10 days? No

## 2021-09-11 DIAGNOSIS — G9341 Metabolic encephalopathy: Secondary | ICD-10-CM | POA: Diagnosis not present

## 2021-09-11 DIAGNOSIS — F0390 Unspecified dementia without behavioral disturbance: Secondary | ICD-10-CM | POA: Diagnosis not present

## 2021-09-11 DIAGNOSIS — I10 Essential (primary) hypertension: Secondary | ICD-10-CM | POA: Diagnosis not present

## 2021-09-11 DIAGNOSIS — R4182 Altered mental status, unspecified: Secondary | ICD-10-CM | POA: Diagnosis not present

## 2021-09-11 DIAGNOSIS — N1831 Chronic kidney disease, stage 3a: Secondary | ICD-10-CM | POA: Diagnosis not present

## 2021-09-11 DIAGNOSIS — N390 Urinary tract infection, site not specified: Secondary | ICD-10-CM | POA: Diagnosis not present

## 2021-09-14 DIAGNOSIS — F0390 Unspecified dementia without behavioral disturbance: Secondary | ICD-10-CM | POA: Diagnosis not present

## 2021-09-14 DIAGNOSIS — R4182 Altered mental status, unspecified: Secondary | ICD-10-CM | POA: Diagnosis not present

## 2021-09-14 DIAGNOSIS — N1831 Chronic kidney disease, stage 3a: Secondary | ICD-10-CM | POA: Diagnosis not present

## 2021-09-14 DIAGNOSIS — G9341 Metabolic encephalopathy: Secondary | ICD-10-CM | POA: Diagnosis not present

## 2021-09-14 DIAGNOSIS — N390 Urinary tract infection, site not specified: Secondary | ICD-10-CM | POA: Diagnosis not present

## 2021-09-14 DIAGNOSIS — I10 Essential (primary) hypertension: Secondary | ICD-10-CM | POA: Diagnosis not present

## 2021-09-17 DIAGNOSIS — S31000A Unspecified open wound of lower back and pelvis without penetration into retroperitoneum, initial encounter: Secondary | ICD-10-CM | POA: Diagnosis not present

## 2021-09-23 ENCOUNTER — Telehealth: Payer: Medicare HMO | Admitting: Pharmacist

## 2021-09-25 ENCOUNTER — Non-Acute Institutional Stay: Payer: Self-pay | Admitting: Student

## 2021-09-25 ENCOUNTER — Other Ambulatory Visit: Payer: Self-pay

## 2021-09-25 DIAGNOSIS — Z515 Encounter for palliative care: Secondary | ICD-10-CM

## 2021-09-25 DIAGNOSIS — R531 Weakness: Secondary | ICD-10-CM

## 2021-09-25 DIAGNOSIS — F03C0 Unspecified dementia, severe, without behavioral disturbance, psychotic disturbance, mood disturbance, and anxiety: Secondary | ICD-10-CM | POA: Diagnosis not present

## 2021-09-27 NOTE — Progress Notes (Signed)
[facility] Consult Note Telephone: [phone]  Fax: [phone]    Date of encounter: 09/25/2021  PATIENT NAME: Mathew Miller [id number]   [phone] (home)  DOB: 10-01-1946 MRN: [id number] PRIMARY CARE PROVIDER:    Dr. Shon Baton PROVIDER:   Vilinda Boehringer, NP  RESPONSIBLE PARTY:    Contact Information     Name Relation Home Work Mobile   Marquez, [place]eesay [phone]  [phone]        I met face to face with patient and family in the facility. Palliative Care was asked to follow this patient by consultation request of  Vilinda Boehringer, NP to address advance care planning and complex medical decision making. This is a follow up visit.                                   ASSESSMENT AND PLAN / RECOMMENDATIONS:   Advance Care Planning/Goals of Care: Goals include to maximize quality of life and symptom management. Our advance care planning conversation included a discussion about:    The value and importance of advance care planning  Experiences with loved ones who have been seriously ill or have died  Exploration of personal, cultural or spiritual beliefs that might influence medical decisions  Exploration of goals of care in the event of a sudden injury or illness  Identification and preparation of a healthcare agent  CODE STATUS: DNR  Education provided on Palliative Medicine vs. Hospice services. We discussed referral to hospice; wife feels he is not ready for hospice yet. He will be staying at facility long term due to the amount of care he would require at home. He is still receiving skilled services. Wife would like for patient to continue receiving antibiotic treatment, hospitalizations as needed. Palliative Medicine will continue to provide support.   Symptom Management/Plan:  Dementia-patient is dependent for adl's. Staff to provide supportive care.  Redirect/reorient as needed. Continue therapy as tolerated.    Generalized weakness-secondary to dementia, CVA. Continue PT/OT as directed. Recommend getting patient out of bed to geri chair recliner as tolerated. Staff to assist with all adl care needs.   Follow up Palliative Care Visit: Palliative care will continue to follow for complex medical decision making, advance care planning, and clarification of goals. Return in 6 weeks or prn.  I spent 25 minutes providing this consultation. More than 50% of the time in this consultation was spent in counseling and care coordination.   PPS: 30%  HOSPICE ELIGIBILITY/DIAGNOSIS: TBD  Chief Complaint: Palliative Medicine follow up visit.   HISTORY OF PRESENT ILLNESS:  Mathew Miller. is a 75 y.o. year old male  with dementia, hx of CVA, CKD 3a, hypertension, hyperlipidemia,  dysautonomia with postural hypotension, urinary retention with foley catheter in place. Patient recently hospitalized 11/5-11/15/22 due to fever, acute metabolic encephalopathy, moderate protein calorie malnutrition.   Patient currently at [facility]. He is still receiving skilled services. He is dependent for all adl's. He is sleeping throughout the day. He is out of bed for short periods to geri chair. Has completed IV antibiotics for UTI. Wife states the facility near her home was double to the price, so patient will stay at [facility]. Facility staff report no improvement overall.    Patient received resting in bed; he had just completed breakfast. He goes  back to sleep during visit. HPI and ROS obtained per wife and facility staff due to his dementia.   History obtained from review of EMR, discussion with primary team, and interview with family, facility staff/caregiver and/or Mathew Miller.  I reviewed available labs, medications, imaging, studies and related documents from the EMR.  Records reviewed and summarized above.    Physical Exam: Weight:  142.2 pounds Constitutional: NAD General: frail appearing, thin  EYES: anicteric sclera, lids intact, no discharge  ENMT: intact hearing, oral mucous membranes moist CV: S1S2, RRR, no LE edema Pulmonary: LCTA, no increased work of breathing, no cough, room air Abdomen: normo-active BS + 4 quadrants, soft and non tender GU: deferred MSK: sarcopenia, non-ambulatory Skin: warm and dry, no rashes or wounds on visible skin Neuro: generalized weakness, A & O to person, familiars Psych: non-anxious affect Hem/lymph/immuno: no widespread bruising   Thank you for the opportunity to participate in the care of Mathew Miller.  The palliative care team will continue to follow. Please call our office at [phone] if we can be of additional assistance.   Ezekiel Slocumb, NP   COVID-19 PATIENT SCREENING TOOL Asked and negative response unless otherwise noted:   Have you had symptoms of covid, tested positive or been in contact with someone with symptoms/positive test in the past 5-10 days? No

## 2021-09-29 DIAGNOSIS — R278 Other lack of coordination: Secondary | ICD-10-CM | POA: Diagnosis not present

## 2021-09-29 DIAGNOSIS — M6281 Muscle weakness (generalized): Secondary | ICD-10-CM | POA: Diagnosis not present

## 2021-09-29 DIAGNOSIS — R2689 Other abnormalities of gait and mobility: Secondary | ICD-10-CM | POA: Diagnosis not present

## 2021-09-29 DIAGNOSIS — R2681 Unsteadiness on feet: Secondary | ICD-10-CM | POA: Diagnosis not present

## 2021-09-29 DIAGNOSIS — A419 Sepsis, unspecified organism: Secondary | ICD-10-CM | POA: Diagnosis not present

## 2021-09-30 DIAGNOSIS — R2681 Unsteadiness on feet: Secondary | ICD-10-CM | POA: Diagnosis not present

## 2021-09-30 DIAGNOSIS — R2689 Other abnormalities of gait and mobility: Secondary | ICD-10-CM | POA: Diagnosis not present

## 2021-09-30 DIAGNOSIS — A419 Sepsis, unspecified organism: Secondary | ICD-10-CM | POA: Diagnosis not present

## 2021-09-30 DIAGNOSIS — R278 Other lack of coordination: Secondary | ICD-10-CM | POA: Diagnosis not present

## 2021-09-30 DIAGNOSIS — M6281 Muscle weakness (generalized): Secondary | ICD-10-CM | POA: Diagnosis not present

## 2021-10-01 DIAGNOSIS — S31000A Unspecified open wound of lower back and pelvis without penetration into retroperitoneum, initial encounter: Secondary | ICD-10-CM | POA: Diagnosis not present

## 2021-10-01 DIAGNOSIS — R278 Other lack of coordination: Secondary | ICD-10-CM | POA: Diagnosis not present

## 2021-10-01 DIAGNOSIS — Z8673 Personal history of transient ischemic attack (TIA), and cerebral infarction without residual deficits: Secondary | ICD-10-CM | POA: Diagnosis not present

## 2021-10-01 DIAGNOSIS — N1831 Chronic kidney disease, stage 3a: Secondary | ICD-10-CM | POA: Diagnosis not present

## 2021-10-01 DIAGNOSIS — F0390 Unspecified dementia without behavioral disturbance: Secondary | ICD-10-CM | POA: Diagnosis not present

## 2021-10-01 DIAGNOSIS — N318 Other neuromuscular dysfunction of bladder: Secondary | ICD-10-CM | POA: Diagnosis not present

## 2021-10-01 DIAGNOSIS — R2689 Other abnormalities of gait and mobility: Secondary | ICD-10-CM | POA: Diagnosis not present

## 2021-10-01 DIAGNOSIS — M6281 Muscle weakness (generalized): Secondary | ICD-10-CM | POA: Diagnosis not present

## 2021-10-01 DIAGNOSIS — A419 Sepsis, unspecified organism: Secondary | ICD-10-CM | POA: Diagnosis not present

## 2021-10-01 DIAGNOSIS — R2681 Unsteadiness on feet: Secondary | ICD-10-CM | POA: Diagnosis not present

## 2021-10-01 DIAGNOSIS — L89890 Pressure ulcer of other site, unstageable: Secondary | ICD-10-CM | POA: Diagnosis not present

## 2021-10-01 DIAGNOSIS — I10 Essential (primary) hypertension: Secondary | ICD-10-CM | POA: Diagnosis not present

## 2021-10-01 DIAGNOSIS — L89620 Pressure ulcer of left heel, unstageable: Secondary | ICD-10-CM | POA: Diagnosis not present

## 2021-10-13 ENCOUNTER — Telehealth: Payer: Self-pay | Admitting: Internal Medicine

## 2021-10-13 NOTE — Telephone Encounter (Signed)
Message from spouse, Mathew Miller via My Chart to Dr. Unk Pinto.  Dr. Melford Aase, I wanted to let you know that Mathew Miller passed away on 10-18-23 at [facility] .  He had been in [facility] for eight (8) days being treated for a UTI and moved to [place] and was there about twenty-eight (54). days . UtI was clear when he left hospital but reoccured while at [facility].  This is a blessing for both he and I.

## 2021-10-18 DEATH — deceased

## 2021-10-30 ENCOUNTER — Ambulatory Visit: Payer: Medicare HMO | Admitting: Neurology

## 2022-02-18 IMAGING — CT CT HEAD W/O CM
3 of 7 series · 13 of 47 positions shown, 15 images · non-contrast
Comparison: MRI 07/15/2020

CLINICAL DATA: Un witnessed fall.  Confusion.

EXAM:
CT HEAD WITHOUT CONTRAST
TECHNIQUE: Contiguous axial images were obtained from the base of the skull
through the vertex without intravenous contrast.

[Series 11: coronal soft tissue · coronal · 0.34mm/px · 3 of 74 slices shown]
[im 19/74  brain]
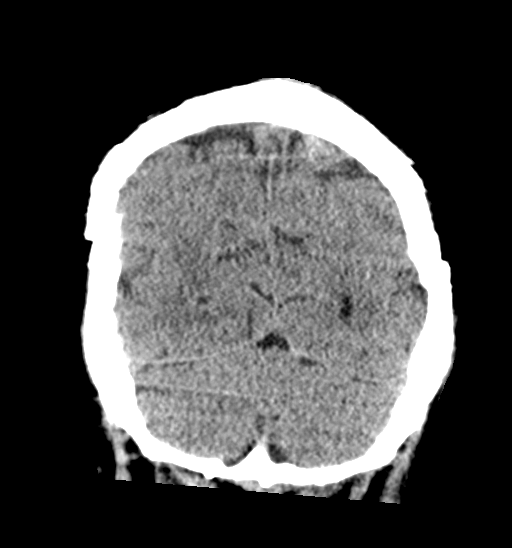
[im 37/74  brain]
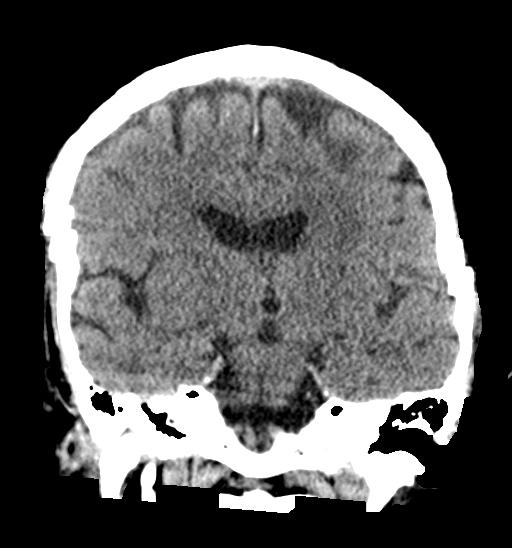
[im 55/74  brain]
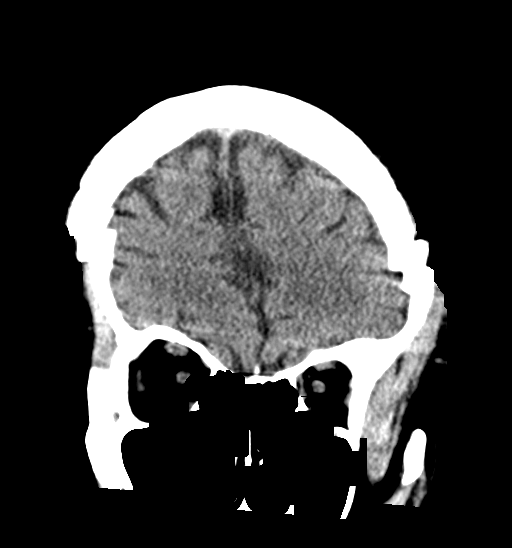

[Series 12: sagittal soft tissue · sagittal · 0.36mm/px · 2 of 60 slices shown]
[im 20/60  brain]
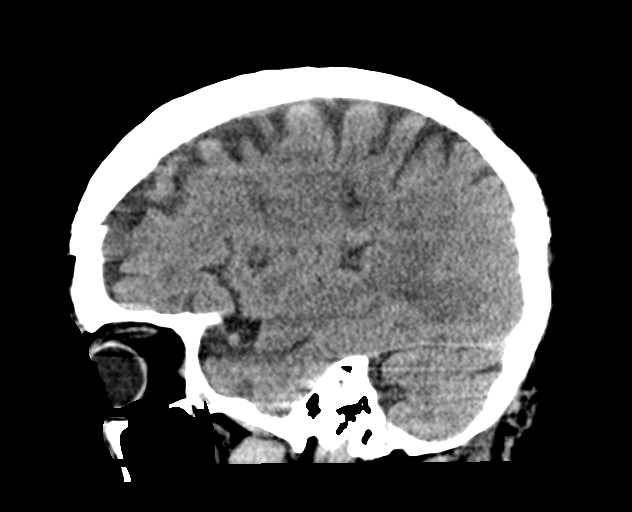
[im 40/60  brain]
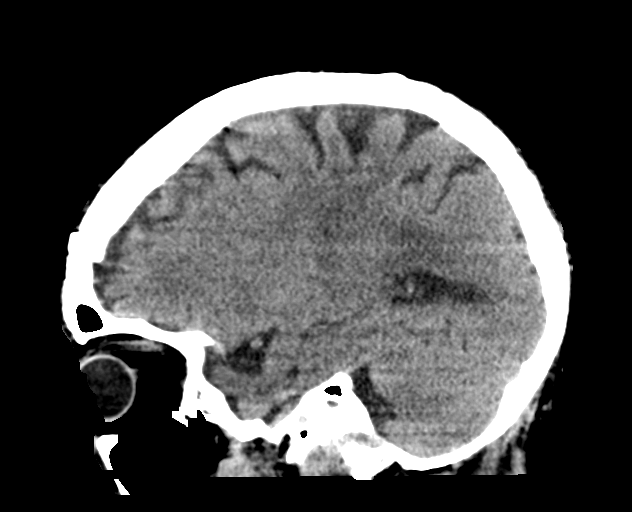

[Series 13: true axial · axial · 0.35mm/px · z∈[-173,-19]mm · 8 of 62 slices shown, 10 images]
[im 5/62  brain]
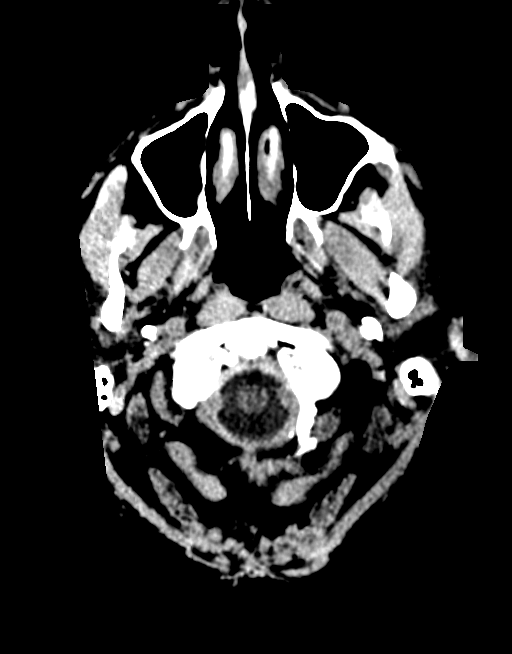
[im 5/62  bone]
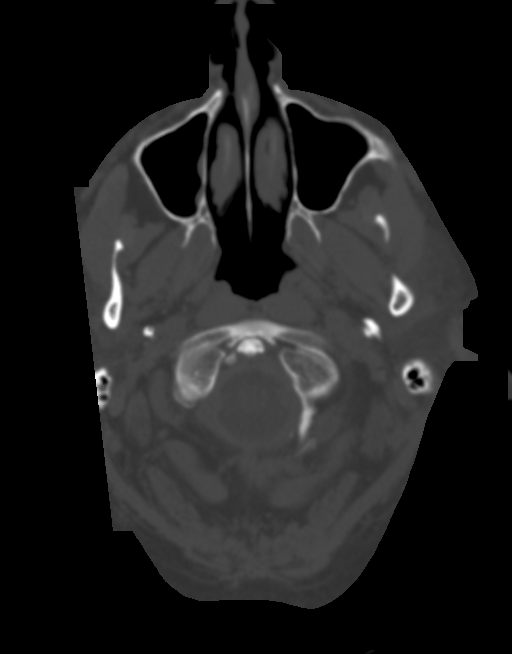
[im 13/62  brain]
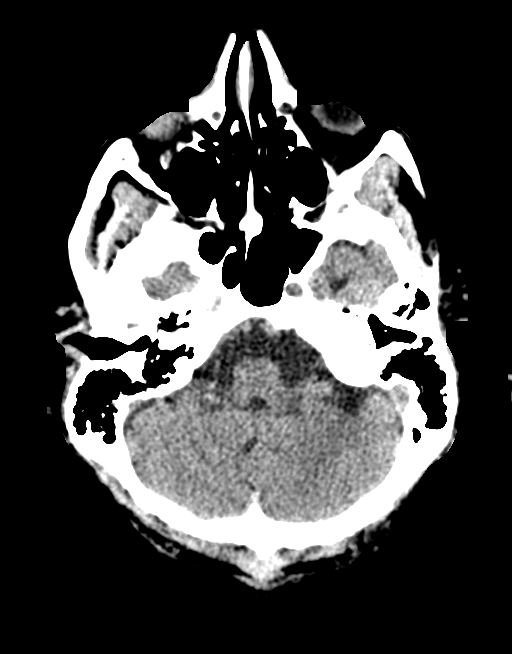
[im 21/62  brain]
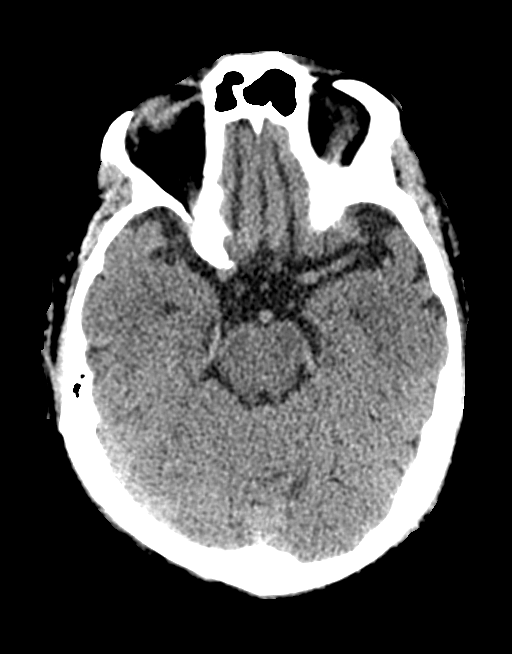
[im 29/62  brain]
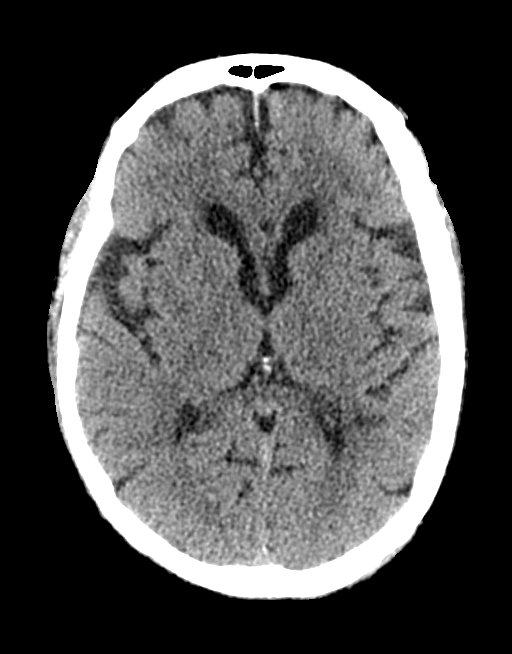
[im 33/62  brain]
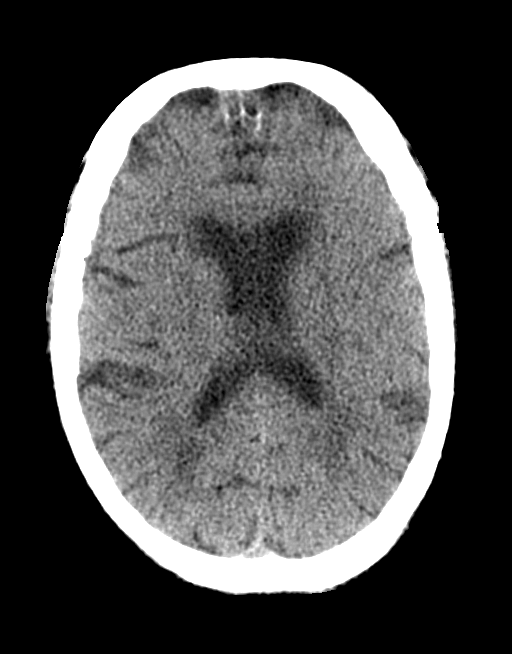
[im 33/62  bone]
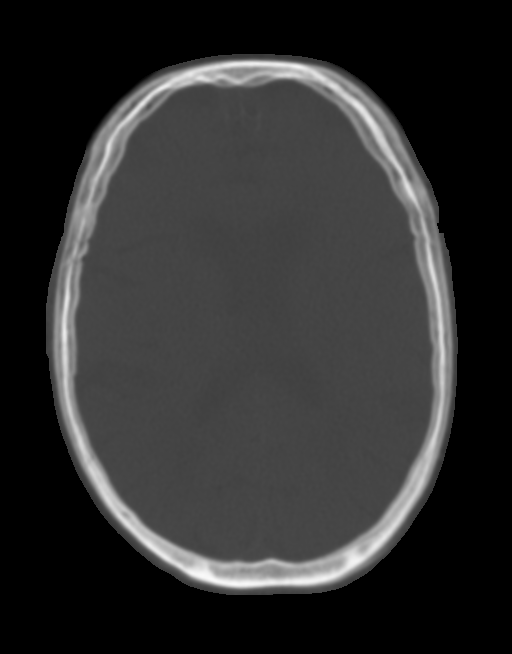
[im 41/62  brain]
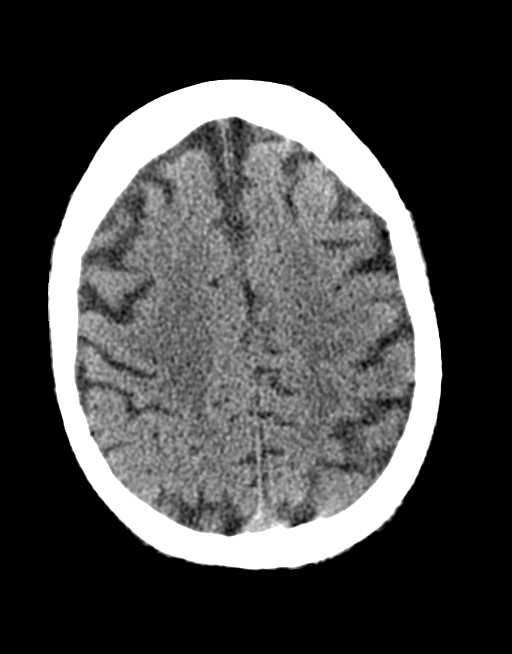
[im 49/62  brain]
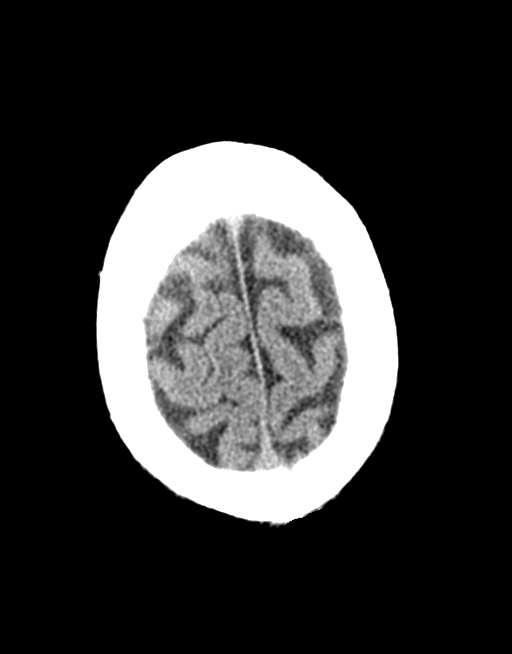
[im 57/62  brain]
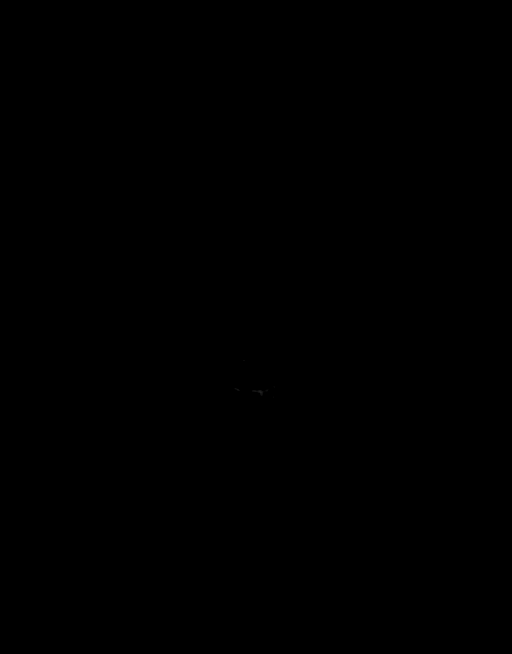

[13 of 47 positions shown; findings below may reference images not displayed]

FINDINGS: Brain: Normal anatomic configuration. Parenchymal volume loss is
commensurate with the patient's age. Mild-to-moderate subcortical
and periventricular white matter changes are present likely
reflecting the sequela of small vessel ischemia. Remote
periventricular white matter infarcts are noted adjacent to the
atrium of the left lateral ventricle and frontal horn of the right
lateral ventricle, as well as within the right centrum semiovale.
No abnormal intra or extra-axial mass lesion or fluid collection. No
abnormal mass effect or midline shift. No evidence of acute
intracranial hemorrhage or infarct. Ventricular size is normal.
Cerebellum unremarkable.

Vascular: No asymmetric hyperdense vasculature at the skull base.

Skull: Intact

Sinuses/Orbits: Paranasal sinuses are clear. Orbits are
unremarkable.

Other: Mastoid air cells and middle ear cavities are clear.
IMPRESSION: Senescent changes. Multiple remote infarcts. No evidence of acute
intracranial hemorrhage or infarct.

## 2022-06-23 ENCOUNTER — Encounter: Payer: Medicare HMO | Admitting: Internal Medicine
